# Patient Record
Sex: Male | Born: 1965 | Race: White | Hispanic: No | Smoking: Current every day smoker
Health system: Southern US, Community
[De-identification: ages and names within clinical notes are randomized; demographics above are authoritative.]

## PROBLEM LIST (undated history)

## (undated) DIAGNOSIS — M79606 Pain in leg, unspecified: Secondary | ICD-10-CM

## (undated) DIAGNOSIS — L818 Other specified disorders of pigmentation: Secondary | ICD-10-CM

## (undated) DIAGNOSIS — F1010 Alcohol abuse, uncomplicated: Secondary | ICD-10-CM

## (undated) DIAGNOSIS — D696 Thrombocytopenia, unspecified: Secondary | ICD-10-CM

## (undated) DIAGNOSIS — M6282 Rhabdomyolysis: Secondary | ICD-10-CM

## (undated) DIAGNOSIS — E871 Hypo-osmolality and hyponatremia: Secondary | ICD-10-CM

## (undated) DIAGNOSIS — F32A Depression, unspecified: Secondary | ICD-10-CM

## (undated) DIAGNOSIS — F1411 Cocaine abuse, in remission: Secondary | ICD-10-CM

## (undated) DIAGNOSIS — K56609 Unspecified intestinal obstruction, unspecified as to partial versus complete obstruction: Secondary | ICD-10-CM

## (undated) DIAGNOSIS — F329 Major depressive disorder, single episode, unspecified: Secondary | ICD-10-CM

## (undated) DIAGNOSIS — G8929 Other chronic pain: Secondary | ICD-10-CM

## (undated) DIAGNOSIS — Z720 Tobacco use: Secondary | ICD-10-CM

## (undated) DIAGNOSIS — B1920 Unspecified viral hepatitis C without hepatic coma: Secondary | ICD-10-CM

## (undated) DIAGNOSIS — N179 Acute kidney failure, unspecified: Secondary | ICD-10-CM

## (undated) DIAGNOSIS — M3320 Polymyositis, organ involvement unspecified: Secondary | ICD-10-CM

## (undated) DIAGNOSIS — G894 Chronic pain syndrome: Secondary | ICD-10-CM

## (undated) DIAGNOSIS — M609 Myositis, unspecified: Secondary | ICD-10-CM

## (undated) DIAGNOSIS — F1910 Other psychoactive substance abuse, uncomplicated: Secondary | ICD-10-CM

## (undated) DIAGNOSIS — K7460 Unspecified cirrhosis of liver: Secondary | ICD-10-CM

## (undated) DIAGNOSIS — R7989 Other specified abnormal findings of blood chemistry: Secondary | ICD-10-CM

## (undated) DIAGNOSIS — M549 Dorsalgia, unspecified: Secondary | ICD-10-CM

## (undated) HISTORY — PX: APPENDECTOMY: SHX54

## (undated) HISTORY — PX: MUSCLE BIOPSY: SHX716

## (undated) HISTORY — PX: HERNIA REPAIR: SHX51

---

## 1996-09-18 DIAGNOSIS — B1710 Acute hepatitis C without hepatic coma: Secondary | ICD-10-CM

## 1999-02-16 ENCOUNTER — Encounter: Payer: Self-pay | Admitting: *Deleted

## 1999-02-16 ENCOUNTER — Emergency Department (HOSPITAL_COMMUNITY): Admission: EM | Admit: 1999-02-16 | Discharge: 1999-02-16 | Payer: Self-pay | Admitting: Emergency Medicine

## 1999-04-12 ENCOUNTER — Emergency Department (HOSPITAL_COMMUNITY): Admission: EM | Admit: 1999-04-12 | Discharge: 1999-04-12 | Payer: Self-pay | Admitting: Emergency Medicine

## 2000-07-21 ENCOUNTER — Emergency Department (HOSPITAL_COMMUNITY): Admission: EM | Admit: 2000-07-21 | Discharge: 2000-07-21 | Payer: Self-pay | Admitting: Emergency Medicine

## 2000-07-21 ENCOUNTER — Encounter: Payer: Self-pay | Admitting: Emergency Medicine

## 2000-08-20 ENCOUNTER — Emergency Department (HOSPITAL_COMMUNITY): Admission: EM | Admit: 2000-08-20 | Discharge: 2000-08-20 | Payer: Self-pay | Admitting: Emergency Medicine

## 2000-08-25 ENCOUNTER — Ambulatory Visit (HOSPITAL_COMMUNITY): Admission: RE | Admit: 2000-08-25 | Discharge: 2000-08-25 | Payer: Self-pay | Admitting: Orthopedic Surgery

## 2000-08-25 ENCOUNTER — Encounter: Payer: Self-pay | Admitting: Orthopedic Surgery

## 2000-09-12 ENCOUNTER — Encounter: Admission: RE | Admit: 2000-09-12 | Discharge: 2000-10-19 | Payer: Self-pay | Admitting: Orthopedic Surgery

## 2001-06-20 ENCOUNTER — Emergency Department (HOSPITAL_COMMUNITY): Admission: EM | Admit: 2001-06-20 | Discharge: 2001-06-20 | Payer: Self-pay | Admitting: Emergency Medicine

## 2001-06-20 ENCOUNTER — Encounter: Payer: Self-pay | Admitting: *Deleted

## 2001-07-03 ENCOUNTER — Inpatient Hospital Stay (HOSPITAL_COMMUNITY): Admission: EM | Admit: 2001-07-03 | Discharge: 2001-07-07 | Payer: Self-pay | Admitting: *Deleted

## 2001-12-23 ENCOUNTER — Inpatient Hospital Stay (HOSPITAL_COMMUNITY): Admission: EM | Admit: 2001-12-23 | Discharge: 2001-12-26 | Payer: Self-pay | Admitting: Psychiatry

## 2002-02-12 ENCOUNTER — Inpatient Hospital Stay (HOSPITAL_COMMUNITY): Admission: EM | Admit: 2002-02-12 | Discharge: 2002-02-14 | Payer: Self-pay | Admitting: Psychiatry

## 2002-02-16 ENCOUNTER — Emergency Department (HOSPITAL_COMMUNITY): Admission: EM | Admit: 2002-02-16 | Discharge: 2002-02-17 | Payer: Self-pay | Admitting: *Deleted

## 2002-02-17 ENCOUNTER — Encounter: Payer: Self-pay | Admitting: *Deleted

## 2002-02-22 ENCOUNTER — Encounter: Payer: Self-pay | Admitting: Emergency Medicine

## 2002-02-22 ENCOUNTER — Emergency Department (HOSPITAL_COMMUNITY): Admission: EM | Admit: 2002-02-22 | Discharge: 2002-02-22 | Payer: Self-pay | Admitting: Emergency Medicine

## 2002-02-24 ENCOUNTER — Emergency Department (HOSPITAL_COMMUNITY): Admission: EM | Admit: 2002-02-24 | Discharge: 2002-02-24 | Payer: Self-pay | Admitting: Emergency Medicine

## 2002-04-14 ENCOUNTER — Emergency Department (HOSPITAL_COMMUNITY): Admission: EM | Admit: 2002-04-14 | Discharge: 2002-04-14 | Payer: Self-pay

## 2004-03-04 ENCOUNTER — Emergency Department (HOSPITAL_COMMUNITY): Admission: EM | Admit: 2004-03-04 | Discharge: 2004-03-04 | Payer: Self-pay | Admitting: Family Medicine

## 2004-04-13 ENCOUNTER — Emergency Department (HOSPITAL_COMMUNITY): Admission: EM | Admit: 2004-04-13 | Discharge: 2004-04-13 | Payer: Self-pay | Admitting: Family Medicine

## 2004-07-02 ENCOUNTER — Emergency Department (HOSPITAL_COMMUNITY): Admission: EM | Admit: 2004-07-02 | Discharge: 2004-07-02 | Payer: Self-pay | Admitting: Internal Medicine

## 2005-02-20 ENCOUNTER — Emergency Department (HOSPITAL_COMMUNITY): Admission: EM | Admit: 2005-02-20 | Discharge: 2005-02-20 | Payer: Self-pay | Admitting: Emergency Medicine

## 2005-02-24 ENCOUNTER — Ambulatory Visit: Payer: Self-pay | Admitting: Cardiovascular Disease

## 2005-02-24 ENCOUNTER — Observation Stay (HOSPITAL_COMMUNITY): Admission: EM | Admit: 2005-02-24 | Discharge: 2005-02-24 | Payer: Self-pay | Admitting: Emergency Medicine

## 2005-03-07 ENCOUNTER — Ambulatory Visit: Payer: Self-pay

## 2005-03-07 ENCOUNTER — Ambulatory Visit: Payer: Self-pay | Admitting: Cardiology

## 2005-06-30 ENCOUNTER — Emergency Department (HOSPITAL_COMMUNITY): Admission: EM | Admit: 2005-06-30 | Discharge: 2005-06-30 | Payer: Self-pay | Admitting: Emergency Medicine

## 2006-09-13 ENCOUNTER — Emergency Department (HOSPITAL_COMMUNITY): Admission: EM | Admit: 2006-09-13 | Discharge: 2006-09-13 | Payer: Self-pay | Admitting: Emergency Medicine

## 2006-10-12 ENCOUNTER — Ambulatory Visit: Payer: Self-pay | Admitting: Family Medicine

## 2007-03-28 ENCOUNTER — Emergency Department (HOSPITAL_COMMUNITY): Admission: EM | Admit: 2007-03-28 | Discharge: 2007-03-28 | Payer: Self-pay | Admitting: Emergency Medicine

## 2007-03-29 ENCOUNTER — Other Ambulatory Visit: Payer: Self-pay | Admitting: Emergency Medicine

## 2007-03-29 ENCOUNTER — Inpatient Hospital Stay (HOSPITAL_COMMUNITY): Admission: AD | Admit: 2007-03-29 | Discharge: 2007-04-01 | Payer: Self-pay | Admitting: Psychiatry

## 2007-03-29 ENCOUNTER — Ambulatory Visit: Payer: Self-pay | Admitting: Psychiatry

## 2007-03-31 ENCOUNTER — Encounter (HOSPITAL_COMMUNITY): Payer: Self-pay | Admitting: Psychiatry

## 2007-04-07 ENCOUNTER — Emergency Department (HOSPITAL_COMMUNITY): Admission: EM | Admit: 2007-04-07 | Discharge: 2007-04-07 | Payer: Self-pay | Admitting: Family Medicine

## 2007-12-03 ENCOUNTER — Inpatient Hospital Stay (HOSPITAL_COMMUNITY): Admission: AD | Admit: 2007-12-03 | Discharge: 2007-12-06 | Payer: Self-pay | Admitting: *Deleted

## 2007-12-03 ENCOUNTER — Emergency Department (HOSPITAL_COMMUNITY): Admission: EM | Admit: 2007-12-03 | Discharge: 2007-12-03 | Payer: Self-pay | Admitting: Emergency Medicine

## 2007-12-03 ENCOUNTER — Ambulatory Visit: Payer: Self-pay | Admitting: *Deleted

## 2007-12-09 ENCOUNTER — Other Ambulatory Visit (HOSPITAL_COMMUNITY): Admission: RE | Admit: 2007-12-09 | Discharge: 2007-12-12 | Payer: Self-pay | Admitting: Psychiatry

## 2007-12-13 ENCOUNTER — Emergency Department (HOSPITAL_COMMUNITY): Admission: EM | Admit: 2007-12-13 | Discharge: 2007-12-13 | Payer: Self-pay | Admitting: Emergency Medicine

## 2008-06-20 ENCOUNTER — Emergency Department (HOSPITAL_COMMUNITY): Admission: EM | Admit: 2008-06-20 | Discharge: 2008-06-20 | Payer: Self-pay | Admitting: Emergency Medicine

## 2008-06-25 ENCOUNTER — Emergency Department (HOSPITAL_COMMUNITY): Admission: EM | Admit: 2008-06-25 | Discharge: 2008-06-25 | Payer: Self-pay | Admitting: Emergency Medicine

## 2009-03-23 ENCOUNTER — Emergency Department (HOSPITAL_COMMUNITY): Admission: EM | Admit: 2009-03-23 | Discharge: 2009-03-23 | Payer: Self-pay | Admitting: Emergency Medicine

## 2009-06-11 ENCOUNTER — Emergency Department (HOSPITAL_COMMUNITY): Admission: EM | Admit: 2009-06-11 | Discharge: 2009-06-11 | Payer: Self-pay | Admitting: Emergency Medicine

## 2009-09-18 DIAGNOSIS — M3320 Polymyositis, organ involvement unspecified: Secondary | ICD-10-CM

## 2009-09-18 HISTORY — DX: Polymyositis, organ involvement unspecified: M33.20

## 2009-10-07 ENCOUNTER — Ambulatory Visit: Payer: Self-pay | Admitting: Vascular Surgery

## 2009-10-08 ENCOUNTER — Emergency Department (HOSPITAL_COMMUNITY): Admission: EM | Admit: 2009-10-08 | Discharge: 2009-10-08 | Payer: Self-pay | Admitting: Emergency Medicine

## 2009-10-13 ENCOUNTER — Inpatient Hospital Stay (HOSPITAL_COMMUNITY): Admission: EM | Admit: 2009-10-13 | Discharge: 2009-10-15 | Payer: Self-pay | Admitting: Emergency Medicine

## 2009-10-13 LAB — CONVERTED CEMR LAB
ALT: 35 units/L
AST: 131 units/L
HCV Ab: REACTIVE

## 2009-10-15 ENCOUNTER — Encounter (INDEPENDENT_AMBULATORY_CARE_PROVIDER_SITE_OTHER): Payer: Self-pay | Admitting: Family Medicine

## 2009-10-26 ENCOUNTER — Ambulatory Visit: Payer: Self-pay | Admitting: Internal Medicine

## 2009-10-26 ENCOUNTER — Encounter (INDEPENDENT_AMBULATORY_CARE_PROVIDER_SITE_OTHER): Payer: Self-pay | Admitting: Nurse Practitioner

## 2009-11-02 ENCOUNTER — Telehealth (INDEPENDENT_AMBULATORY_CARE_PROVIDER_SITE_OTHER): Payer: Self-pay | Admitting: Nurse Practitioner

## 2009-11-10 ENCOUNTER — Telehealth (INDEPENDENT_AMBULATORY_CARE_PROVIDER_SITE_OTHER): Payer: Self-pay | Admitting: Nurse Practitioner

## 2009-11-22 ENCOUNTER — Telehealth (INDEPENDENT_AMBULATORY_CARE_PROVIDER_SITE_OTHER): Payer: Self-pay | Admitting: Nurse Practitioner

## 2009-11-25 ENCOUNTER — Ambulatory Visit: Payer: Self-pay | Admitting: Nurse Practitioner

## 2009-11-25 DIAGNOSIS — Z720 Tobacco use: Secondary | ICD-10-CM

## 2009-12-16 ENCOUNTER — Telehealth (INDEPENDENT_AMBULATORY_CARE_PROVIDER_SITE_OTHER): Payer: Self-pay | Admitting: Nurse Practitioner

## 2009-12-20 ENCOUNTER — Telehealth (INDEPENDENT_AMBULATORY_CARE_PROVIDER_SITE_OTHER): Payer: Self-pay | Admitting: Nurse Practitioner

## 2009-12-22 ENCOUNTER — Encounter (INDEPENDENT_AMBULATORY_CARE_PROVIDER_SITE_OTHER): Payer: Self-pay | Admitting: Nurse Practitioner

## 2009-12-22 LAB — CONVERTED CEMR LAB
ALT: 106 units/L
Creatinine, Ser: 0.7 mg/dL
Glucose, Bld: 90 mg/dL

## 2009-12-24 ENCOUNTER — Encounter (INDEPENDENT_AMBULATORY_CARE_PROVIDER_SITE_OTHER): Payer: Self-pay | Admitting: Nurse Practitioner

## 2010-01-14 ENCOUNTER — Telehealth (INDEPENDENT_AMBULATORY_CARE_PROVIDER_SITE_OTHER): Payer: Self-pay | Admitting: Nurse Practitioner

## 2010-01-18 ENCOUNTER — Telehealth (INDEPENDENT_AMBULATORY_CARE_PROVIDER_SITE_OTHER): Payer: Self-pay | Admitting: Nurse Practitioner

## 2010-02-03 ENCOUNTER — Ambulatory Visit: Payer: Self-pay | Admitting: Nurse Practitioner

## 2010-02-03 LAB — CONVERTED CEMR LAB
Cholesterol, target level: 200 mg/dL
HDL goal, serum: 40 mg/dL
LDL Goal: 160 mg/dL

## 2010-02-05 ENCOUNTER — Emergency Department (HOSPITAL_COMMUNITY): Admission: EM | Admit: 2010-02-05 | Discharge: 2010-02-05 | Payer: Self-pay | Admitting: Emergency Medicine

## 2010-02-05 ENCOUNTER — Ambulatory Visit: Payer: Self-pay | Admitting: Vascular Surgery

## 2010-02-05 ENCOUNTER — Encounter (INDEPENDENT_AMBULATORY_CARE_PROVIDER_SITE_OTHER): Payer: Self-pay | Admitting: Gastroenterology

## 2010-02-07 ENCOUNTER — Encounter (INDEPENDENT_AMBULATORY_CARE_PROVIDER_SITE_OTHER): Payer: Self-pay | Admitting: Nurse Practitioner

## 2010-02-08 ENCOUNTER — Telehealth (INDEPENDENT_AMBULATORY_CARE_PROVIDER_SITE_OTHER): Payer: Self-pay | Admitting: Nurse Practitioner

## 2010-02-09 ENCOUNTER — Emergency Department (HOSPITAL_COMMUNITY): Admission: EM | Admit: 2010-02-09 | Discharge: 2010-02-09 | Payer: Self-pay | Admitting: Emergency Medicine

## 2010-02-14 ENCOUNTER — Encounter (INDEPENDENT_AMBULATORY_CARE_PROVIDER_SITE_OTHER): Payer: Self-pay | Admitting: Nurse Practitioner

## 2010-02-15 ENCOUNTER — Telehealth (INDEPENDENT_AMBULATORY_CARE_PROVIDER_SITE_OTHER): Payer: Self-pay | Admitting: Nurse Practitioner

## 2010-02-17 ENCOUNTER — Encounter (INDEPENDENT_AMBULATORY_CARE_PROVIDER_SITE_OTHER): Payer: Self-pay | Admitting: Nurse Practitioner

## 2010-02-21 ENCOUNTER — Encounter (INDEPENDENT_AMBULATORY_CARE_PROVIDER_SITE_OTHER): Payer: Self-pay | Admitting: Nurse Practitioner

## 2010-02-22 ENCOUNTER — Telehealth (INDEPENDENT_AMBULATORY_CARE_PROVIDER_SITE_OTHER): Payer: Self-pay | Admitting: Nurse Practitioner

## 2010-02-23 ENCOUNTER — Encounter (INDEPENDENT_AMBULATORY_CARE_PROVIDER_SITE_OTHER): Payer: Self-pay | Admitting: Nurse Practitioner

## 2010-02-24 ENCOUNTER — Encounter (INDEPENDENT_AMBULATORY_CARE_PROVIDER_SITE_OTHER): Payer: Self-pay | Admitting: Nurse Practitioner

## 2010-03-14 ENCOUNTER — Encounter (INDEPENDENT_AMBULATORY_CARE_PROVIDER_SITE_OTHER): Payer: Self-pay | Admitting: Nurse Practitioner

## 2010-03-30 ENCOUNTER — Encounter (INDEPENDENT_AMBULATORY_CARE_PROVIDER_SITE_OTHER): Payer: Self-pay | Admitting: Nurse Practitioner

## 2010-05-04 ENCOUNTER — Encounter (INDEPENDENT_AMBULATORY_CARE_PROVIDER_SITE_OTHER): Payer: Self-pay | Admitting: Nurse Practitioner

## 2010-06-17 ENCOUNTER — Ambulatory Visit: Payer: Self-pay | Admitting: Nurse Practitioner

## 2010-06-17 DIAGNOSIS — IMO0001 Reserved for inherently not codable concepts without codable children: Secondary | ICD-10-CM

## 2010-06-17 DIAGNOSIS — M79609 Pain in unspecified limb: Secondary | ICD-10-CM

## 2010-06-17 DIAGNOSIS — M549 Dorsalgia, unspecified: Secondary | ICD-10-CM | POA: Insufficient documentation

## 2010-06-29 ENCOUNTER — Telehealth (INDEPENDENT_AMBULATORY_CARE_PROVIDER_SITE_OTHER): Payer: Self-pay | Admitting: Nurse Practitioner

## 2010-07-04 ENCOUNTER — Telehealth (INDEPENDENT_AMBULATORY_CARE_PROVIDER_SITE_OTHER): Payer: Self-pay | Admitting: Nurse Practitioner

## 2010-07-11 ENCOUNTER — Encounter (INDEPENDENT_AMBULATORY_CARE_PROVIDER_SITE_OTHER): Payer: Self-pay | Admitting: Nurse Practitioner

## 2010-07-13 ENCOUNTER — Telehealth (INDEPENDENT_AMBULATORY_CARE_PROVIDER_SITE_OTHER): Payer: Self-pay | Admitting: Nurse Practitioner

## 2010-07-18 ENCOUNTER — Telehealth (INDEPENDENT_AMBULATORY_CARE_PROVIDER_SITE_OTHER): Payer: Self-pay | Admitting: Nurse Practitioner

## 2010-08-24 ENCOUNTER — Telehealth (INDEPENDENT_AMBULATORY_CARE_PROVIDER_SITE_OTHER): Payer: Self-pay | Admitting: Nurse Practitioner

## 2010-08-24 ENCOUNTER — Ambulatory Visit: Payer: Self-pay | Admitting: Pain Medicine

## 2010-08-26 ENCOUNTER — Encounter (INDEPENDENT_AMBULATORY_CARE_PROVIDER_SITE_OTHER): Payer: Self-pay | Admitting: Internal Medicine

## 2010-09-28 ENCOUNTER — Telehealth (INDEPENDENT_AMBULATORY_CARE_PROVIDER_SITE_OTHER): Payer: Self-pay | Admitting: Nurse Practitioner

## 2010-10-03 ENCOUNTER — Telehealth (INDEPENDENT_AMBULATORY_CARE_PROVIDER_SITE_OTHER): Payer: Self-pay | Admitting: Nurse Practitioner

## 2010-10-06 ENCOUNTER — Encounter (INDEPENDENT_AMBULATORY_CARE_PROVIDER_SITE_OTHER): Payer: Self-pay | Admitting: Nurse Practitioner

## 2010-10-18 NOTE — Letter (Signed)
Summary: Discharge Summary  Discharge Summary   Imported By: Arta Bruce 02/28/2010 16:49:24  _____________________________________________________________________  External Attachment:    Type:   Image     Comment:   External Document

## 2010-10-18 NOTE — Progress Notes (Signed)
Summary: MEDS REFILL  Phone Note Refill Request Call back at [phone]   Refills Requested: Medication #1:  OXYCODONE-ACETAMINOPHEN 10-650 MG TABS One tablet by mouth three times a day as needed for pain. NEED REFILL , [facility] [address] [address] [facility] [address]  Initial call taken by: Domenic Polite,  July 13, 2010 2:34 PM  Follow-up for Phone Call        I have another note open for pt - HIs meds will be filled on MONDAY! Follow-up by: Lehman Prom FNP,  July 13, 2010 3:03 PM  Additional Follow-up for Phone Call Additional follow up Details #1::        Pt. notified.  Dutch Quint RN  July 13, 2010 3:09 PM

## 2010-10-18 NOTE — Progress Notes (Signed)
Summary: Pain refill  Phone Note Call from Patient Call back at [facility] Phone [phone] Call back at [phone]   Summary of Call: PT IS REQUESTING FOR MORE REFILLS FROM IBUPROFEN AND PECOCET MEDICATIONS.  THE PT SUPPOSTLY NEEDS TO PICK UP BY WEDNESDAY BUT HE STATES THAT HE WON'T HAVE TRANSPORTATION NEXT WEEK. MARTIN FNP Initial call taken by: Manon Hilding,  January 14, 2010 8:44 AM  Follow-up for Phone Call        forward to N. Daphine Deutscher, FNP both meds last filled 12/20/2009 Follow-up by: Levon Hedger,  Jan 17, 2010 8:49 AM  Additional Follow-up for Phone Call Additional follow up Details #1::        PT CALLED BACK AGAIN BECAUSE HE NEEDS TO GET HIS PAIN MEDICATION BY TODAY BECAUSE HE DO NOT HAVE TRANSPORTATION TO COME ON WEDNESDAY (MOTHER WILL BE LIVING OUT OF THE TOWN BY TODAY).Manon Hilding  Jan 17, 2010 11:03 AM he also states that he is out of medication and he is in alot of pain and if he can not get medication today he says that he will have to go to the ER.  I told pt that you have the note and will respond as soon as you get a chance.  pt is informed that you are seeing pt and I told him as soon as you had a chance to review chart and respond I would give him a call to let him know what would be done. Levon Hedger  Jan 17, 2010 11:13 AM     Additional Follow-up for Phone Call Additional follow up Details #2::    As unfortunate as it is...his medications are due when they are due. Will not fill before 01/19/2010 (Wednesday)   when he gets a ride or mother returns to town he can pick up Follow-up by: Lehman Prom FNP,  Jan 17, 2010 1:35 PM  Additional Follow-up for Phone Call Additional follow up Details #3:: Details for Additional Follow-up Action Taken: Pt called back and informed of when he could get his meds, he stated he guesses he will go to the ED then and that "I guess y'all won't be my doctors no more"............ Vesta Mixer CMA  Jan 17, 2010 2:07 PM    noted. if pt gets meds from ER will  NOT refill on 01/19/2010 -  pain contract will be inforced n.martin,fnp  Jan 17, 2010  Jan 17, 2010     Appended Document: Pain refill Pt called back to apologize and said he can wait for Wed to get his meds.

## 2010-10-18 NOTE — Assessment & Plan Note (Signed)
Summary: Muscle pain   Vital Signs:  Patient profile:   45 year old male Weight:      141.9 pounds BMI:     22.31 BSA:     1.75 Temp:     98.4 degrees F oral Pulse rate:   103 / minute Pulse rhythm:   regular Resp:     20 per minute BP sitting:   110 / 75  (left arm) Cuff size:   regular  Vitals Entered ByLevon Hedger (Feb 03, 2010 9:35 AM) CC: follow-up visit 2 month...pain in right calf muscle..percocet is making him sick on the stomach, Lipid Management Is Patient Diabetic? No Pain Assessment Patient in pain? yes     Location: leg  Does patient need assistance? Functional Status Self care Ambulation Normal   CC:  follow-up visit 2 month...pain in right calf muscle..percocet is making him sick on the stomach and Lipid Management.  History of Present Illness:  Pt into the office with follow up on leg pain. Pt was in [facility] back February and dx with myositis. Presented at that time with quick onset of pain in right thigh. He had a muscle biopsy done.  Started on Prednisone taper.   He was seen in this office in follow up with slight improvement in symptoms. He was given percocet for pain, which only helped minimally. Pt has been to [facility] Rheumatology and he did get inital testing to conform the original dx. Pt was to f/u for the results but was not able to due to transportation He was to get the lab results and nerve conduction test done but again was NOT able to completed. Suddenly in the past 2 weeks pain has progressed down into the right leg. Pt is unable to bear weight on his right leg. Must walk on his tip toes. He has completed his supply of percocet as he is having to take more frequently in attempt to decrease pain. Daytime activities such as walking, showering is difficult.  Pt is not able to sleep at night due to pain  Presents today with wife and 2 children who comfirm pt's decline in function especially over the past 2 weeks  Lipid  Management History:      Positive NCEP/ATP III risk factors include current tobacco user.  Negative NCEP/ATP III risk factors include male age less than 63 years old.    Allergies (verified): 1)  ! Darvocet  Review of Systems General:  Denies fever. CV:  Denies chest pain or discomfort. Resp:  Denies cough. GI:  Complains of nausea; denies abdominal pain and vomiting; liekly due to increase in pain meds. MS:  Complains of muscle; right thigh and calf muscles.  Physical Exam  General:  alert.   Head:  normocephalic.  thin hair   Detailed Neurologic Exam  Gait:    steppage gait.   Posture:    moderately stooped.   Strength:    abnormal right lower extremity.     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  Problem # 2:  TOBACCO ABUSE (ICD-305.1) advised cessation  Complete Medication List: 1)  Oxycodone-acetaminophen 5-325 Mg Tabs (Oxycodone-acetaminophen) .... 2 tablets by mouth two times a day  as needed for pain 2)  Ibuprofen 800 Mg Tabs (Ibuprofen) .... One tablet by mouth two times a day as needed for pain  Lipid Assessment/Plan:      Based on NCEP/ATP III, the patient's risk factor category is "0-1 risk factors".  The patient's lipid goals are as follows: Total cholesterol goal is 200; LDL cholesterol goal is 160; HDL cholesterol goal is 40; Triglyceride goal is 150.     Patient Instructions: 1)  Rheumatology appointment may be several weeks away. 2)  This provider would advise that you go to the Emergency room for evaluation particularly because the pain in your right thigh progressing to right shin. 3)  You need to see a Rheumatologist ASAP and treatment with  interventions other than pain medications

## 2010-10-18 NOTE — Progress Notes (Signed)
Summary: CALLING FOR HIS PAIN MEDS  Phone Note Call from Patient Call back at Home Phone [phone]   Reason for Call: Refill Medication Summary of Call: [name], MR [place] CALLED AHEAD OF TIME FOR HIS IBUPROFEN, VALUM, AND PERCOCET WHICH IS DUE 10/30, BUT SINCE THE 30TH IS ON A SUNDAY, CAN HE PICK IT UP ON THE 28th.  HE IS ALSO CHECKING ON THE REFERRAL TO PAIN MANAGEMENT CLINIC AND THE ORTHOPEDIC. Initial call taken by: Leodis Rains,  July 04, 2010 9:11 AM  Follow-up for Phone Call        forward to N. Daphine Deutscher, fnp Follow-up by: Levon Hedger,  July 08, 2010 10:59 AM  Additional Follow-up for Phone Call Additional follow up Details #1::        will fill at the end of this week notify Good that pain managment referral is pending and once he is transferred to the pain clinic then they will manage her meds Additional Follow-up by: Lehman Prom FNP,  July 11, 2010 5:58 PM    Additional Follow-up for Phone Call Additional follow up Details #2::    Advised Good that he had an appt with ortho on 07/09/2010 and this office was informed that he did not go. pain management referral is still pending be sure Good is clear this office will NOT provide long term pain meds - this will be the last month This office is only responsible for making referrals for Good so he can get necessary treatment and f/u. if he choses not to keep the appt this his consequence will be uncertainty about his dx. But be very clear this office is not pain management and will not continue to prescribe narcotics November will be the last Rx (rx printed and in basket)  Good informed of above information and has picked up pain medication. Follow-up by: Levon Hedger,  July 19, 2010 1:00 PM  Prescriptions: OXYCODONE-ACETAMINOPHEN 10-650 MG TABS (OXYCODONE-ACETAMINOPHEN) One tablet by mouth three times a day as needed for pain  #90 x 0   Entered and Authorized by:   Lehman Prom FNP   Signed by:    Lehman Prom FNP on 07/16/2010   Method used:   Print then Give to Patient   RxID:   [id number] DIAZEPAM 5 MG TABS (DIAZEPAM) One tablet by mouth nightly as needed for muscles and rest  #20 x 0   Entered and Authorized by:   Lehman Prom FNP   Signed by:   Lehman Prom FNP on 07/16/2010   Method used:   Print then Give to Patient   RxID:   [id number] IBUPROFEN 800 MG TABS (IBUPROFEN) One tablet by mouth two times a day as needed for pain  #60 x 0   Entered and Authorized by:   Lehman Prom FNP   Signed by:   Lehman Prom FNP on 07/16/2010   Method used:   Print then Give to Patient   RxID:   [id number] OXYCODONE-ACETAMINOPHEN 10-650 MG TABS (OXYCODONE-ACETAMINOPHEN) One tablet by mouth three times a day as needed for pain  #90 x 0   Entered and Authorized by:   Lehman Prom FNP   Signed by:   Lehman Prom FNP on 07/16/2010   Method used:   Print then Give to Patient   RxID:   [id number] DIAZEPAM 5 MG TABS (DIAZEPAM) One tablet by mouth nightly as needed for muscles and rest  #20 x 0   Entered and Authorized by:   Lehman Prom FNP  Signed by:   Lehman Prom FNP on 07/16/2010   Method used:   Print then Give to Patient   RxID:   [id number] IBUPROFEN 800 MG TABS (IBUPROFEN) One tablet by mouth two times a day as needed for pain  #60 x 0   Entered and Authorized by:   Lehman Prom FNP   Signed by:   Lehman Prom FNP on 07/16/2010   Method used:   Print then Give to Patient   RxID:   [id number]

## 2010-10-18 NOTE — Progress Notes (Signed)
Summary: Pain meds  Phone Note Call from Patient Call back at Home Phone [phone]   Summary of Call: Pt wants to know for sure if his medication can be ready early in the morning because someone will gave him the right to come here. Can someone call him back.Manon Hilding  Jan 18, 2010 2:19 PM    Follow-up for Phone Call        Rx printed for pt to pick up Follow-up by: Lehman Prom FNP,  Jan 19, 2010 8:26 AM  Additional Follow-up for Phone Call Additional follow up Details #1::        pt in office to pick up Rx. Additional Follow-up by: Levon Hedger,  Jan 19, 2010 8:42 AM    Prescriptions: OXYCODONE-ACETAMINOPHEN 5-325 MG TABS (OXYCODONE-ACETAMINOPHEN) 2 tablets by mouth two times a day as needed for pain  #90 x 0   Entered and Authorized by:   Lehman Prom FNP   Signed by:   Lehman Prom FNP on 01/19/2010   Method used:   Print then Give to Patient   RxID:   [id number]

## 2010-10-18 NOTE — Letter (Signed)
[facility]: [facility]   Imported By: Arta Bruce 02/24/2010 15:44:08  _____________________________________________________________________  External Attachment:    Type:   Image     Comment:   External Document

## 2010-10-18 NOTE — Letter (Signed)
Summary: PT INFORMATION SHEET  PT INFORMATION SHEET   Imported By: Arta Bruce 12/15/2009 12:36:38  _____________________________________________________________________  External Attachment:    Type:   Image     Comment:   External Document

## 2010-10-18 NOTE — Letter (Signed)
Summary: FAXED REQUESTED RECORDS TO [facility]  FAXED REQUESTED RECORDS TO [facility]   Imported By: Arta Bruce 07/11/2010 09:15:20  _____________________________________________________________________  External Attachment:    Type:   Image     Comment:   External Document

## 2010-10-18 NOTE — Progress Notes (Signed)
Summary: Rheumatology referral update  Phone Note Call from Patient Call back at Home Phone [phone] Call back at [phone]   Caller: WIFE- LINDA Reason for Call: Referral Summary of Call: MARTIN PT. MR Tompson WIFE CALLED AND SAYS THAT WHEN MR Baril WAS IN THE HOSPITAL IN [place], THE SURGEON, DR WAKEFIELD SAYS THAT MR Schussler NEEDS A FOLLOW UP APPOINTMENT WITH A RHUMATOLOGIST CONCERNING THE INLAMATION IN HIS RIGHT LEG. HE HAD A MUSCLE BIOPSY DONE ON JANUARY 28th.  MR Finkbiner IS CURRENTLY TAKING PREDNOSONE AND HE HAS  3 DAYS OF THE MEDICAITON LEFT. HE WAS TOLD BY DR Hinda Lenis AT [facility] THAT HE WOULD HAVE TO PAY MONEY UP FRON TO SEE A RHUMATOLOGIST BUT HE HAS NO INSURANCE. Initial call taken by: Leodis Rains,  November 02, 2009 11:33 AM  Follow-up for Phone Call        The pt called in because she said that the provider needs the phone number from the Rheumatologoy. [phone].Manon Hilding  November 02, 2009 12:25 PM  spoke with patient who insists that I call his wife due to he has trouble remembering things and she takes care of all his affairs.Marland KitchenMarland KitchenMarland KitchenMikey College CMA  November 03, 2009 10:56 AM   spoke with Linda(wife)[phone] pt was seen in hospital by rhuematologist but obviously when patient was discharged the office would not see him due to not having any insurance. Wife states that the original rhuematologist stated he needed to stay on prednisone and so today is his last day of the medication. they are concerned since at this point they are in limbo. I did advise wife woud forward information to provider regarding medications and also to request a referral to an outside rheumatologist.      Follow-up by: Mikey College CMA,  November 03, 2009 11:12 AM  Additional Follow-up for Phone Call Additional follow up Details #1::        pt will need to be referred to rheumatology at [facility] unfortunately no intown rheumatologist accept HS pts without the co-payment.  It may mean  that pt will have a wait time before he can be seen but will send info to Candi Leash who can try to get pt to [facility] at next available appointment given the severe nature of his problem and recent hospital D/C Additional Follow-up by: Lehman Prom FNP,  November 04, 2009 8:45 AM    Additional Follow-up for Phone Call Additional follow up Details #2::    Spoke to Pt on 11-05-09.Pt aware of appt on 12-22-09 @ 3:30pm @ [facility].Pt is also placed on a cancellation list.  Additional Follow-up for Phone Call Additional follow up Details #3:: Details for Additional Follow-up Action Taken: noted Additional Follow-up by: Lehman Prom FNP,  November 08, 2009 8:03 AM

## 2010-10-18 NOTE — Progress Notes (Signed)
Summary: PAIN MED REFILL  Phone Note Call from Patient Call back at Home Phone [phone]   Summary of Call: PT STATES HE WANTS TO SEE IF HE CAN GO AHEAD AND GET HIS RX FOR PERCOCET TODAY. HE IS DUE TOMORROW BUT STATES HE IS IN ALOT OF PAIN SINCE HE HAD A BIOPSY TAKEN OUT OF HIS LEG. Initial call taken by: Mikey College CMA,  November 22, 2009 11:00 AM  Follow-up for Phone Call        Rx in basket -pt to come pick up notify  him that this is a 30 day supply. No increase in his pain medications. he may try to take ibuprofen in between so that pain does not get quite so intense. He should keep appt with specialist as scheduled Follow-up by: Lehman Prom FNP,  November 22, 2009 11:59 AM  Additional Follow-up for Phone Call Additional follow up Details #1::        pt informed of above information .  Rx is in front office for pt to pick up. Additional Follow-up by: Levon Hedger,  November 22, 2009 12:30 PM    Prescriptions: OXYCODONE-ACETAMINOPHEN 5-325 MG TABS (OXYCODONE-ACETAMINOPHEN) 2 tablets by mouth two times a day as needed for pain  #90 x 0   Entered and Authorized by:   Lehman Prom FNP   Signed by:   Lehman Prom FNP on 11/22/2009   Method used:   Print then Give to Patient   RxID:   [id number]

## 2010-10-18 NOTE — Letter (Signed)
Summary: Generic Letter  [facility]  [address] [place], [place] [address]   Phone: [phone]  Fax: [phone]    02/23/2010  [name] [address] [place], [place]  [address]  Dear [name],  It has come to my attention that you have come to this office several times requesting pain medications.  I have reviewed the records and corresponding tests done during your recent admission to [facility].  There is still some question as to the cause and nature of your pain. It is my understanding that additional labs have been done and you are supposed to follow up there accordingly.  I would advise you to keep all scheduled follow up appointments at [facility] as outlined during your hospital discharge.  Regarding pain medication - THIS OFFICE IS NOT A PAIN CLINIC You were given pain medications when you originally established with this office but the intent was not that you would be maintained on these medications for an extended period of time. That is why this provider advised you seek further explanation as to the cause of pain.  This still has not been determined and unfortunately this provider is NOT willing to continue to provide you with narcotic medication.   You are requesting medication from this office but you were given oxycodone 10mg  - 30 tablets on February 21, 2010.  You should NOT have completed your supply of those medications if taking as ordered.  Given your history of overuse of pain medications would advise you get medications in a controlled setting either at a pain clinic (which will be difficult to attend due to cost) or when you attend your speciality appointments at [facility].  Since appropriate diagnosis depends on your ability to follow up. I will give you ibuprofen 800mg  to alternate with the oxycodone that you already have but again I am NOT willing to prescribe any further narcotics for you.  Sincerely,    Ryan Prom FNP [facility]

## 2010-10-18 NOTE — Letter (Signed)
Summary: OUTPATIENT CHEMISTRY RESULT  OUTPATIENT CHEMISTRY RESULT   Imported By: Arta Bruce 05/12/2010 11:55:40  _____________________________________________________________________  External Attachment:    Type:   Image     Comment:   External Document

## 2010-10-18 NOTE — Letter (Signed)
Summary: OUTPATIENT CHEMISTRY RESULT  OUTPATIENT CHEMISTRY RESULT   Imported By: Arta Bruce 03/16/2010 12:43:00  _____________________________________________________________________  External Attachment:    Type:   Image     Comment:   External Document

## 2010-10-18 NOTE — Miscellaneous (Signed)
Summary: Lab results  Clinical Lists Changes  Observations: Added new observation of SGPT (ALT): 106 units/L (12/22/2009 8:25) Added new observation of SGOT (AST): 91 units/L (12/22/2009 8:25) Added new observation of ALK PHOS: 46 units/L (12/22/2009 8:25) Added new observation of BILI TOTAL: 0.8 mg/dL (37/16/9678 9:38) Added new observation of CREATININE: 0.7 mg/dL (07/04/5101 5:85) Added new observation of BUN: 6 mg/dL (27/78/2423 5:36) Added new observation of BG RANDOM: 90 mg/dL (14/43/1540 0:86) Added new observation of CO2 PLSM/SER: 24 meq/L (12/22/2009 8:25) Added new observation of CL SERUM: 97 meq/L (12/22/2009 8:25) Added new observation of K SERUM: 4.3 meq/L (12/22/2009 8:25) Added new observation of NA: 133 meq/L (12/22/2009 8:25)

## 2010-10-18 NOTE — Letter (Signed)
Summary: Handout Printed  Printed Handout:  - Polymyositis

## 2010-10-18 NOTE — Progress Notes (Signed)
Summary: REQUESTING PAIN MED.  Phone Note Call from Patient Call back at Home Phone [phone]   Reason for Call: Refill Medication Summary of Call: [name] HAD AN APPOINTMENT THIS AFTERNOON, BUT HE CANCELLED, BECAUSE HE DIDN'T HAVE TRANSPORTATION AND MONEY. HE WANTS TO KNOW IF HE CAN GET HIS MEDICATION IN ADVANCE (PERCOCET) BECAUSE HE IS IN A LOT OF PAIN. HE SAYS THAT HE IS TAKING THE IBUPROFEN, BUT ITS NOT DOING ANY GOOD AT ALL.  HE SAYS THAT IF HE CAN'T GET HIS MEDICINE, THEN HE MAY HAVE TO BREAK HIS PAIN CONTRACT AND GO TO THE ER. HE CAN'T SIT LONG, CAN'T SLEEP AND HE IS UP ALL NIGHT. Initial call taken by: Leodis Rains,  December 16, 2009 10:17 AM  Follow-up for Phone Call        forward to N. Daphine Deutscher, FNP Follow-up by: Levon Hedger,  December 16, 2009 1:14 PM  Additional Follow-up for Phone Call Additional follow up Details #1::        Pt was given percocet on 11/22/2009 - month supply Will not refill medications until 12/23/2009 Advise pt to keep his appointment at [facility] on next week as scheduled for follow up on exactly what is causing the problem. Additional Follow-up by: Lehman Prom FNP,  December 16, 2009 2:52 PM    Additional Follow-up for Phone Call Additional follow up Details #2::    The pt is aware of the previous message.Manon Hilding  December 16, 2009 4:05 PM

## 2010-10-18 NOTE — Progress Notes (Signed)
Summary: Pain medications  Phone Note Call from Patient   Summary of Call: Pt requesting refill on his pain med, because he is in a lot of pain.  He states he is not due for 3 days, but he can't stand the pain any longer and may have to go to the ED.  Pt can be reached at [phone]. Initial call taken by: Vesta Mixer CMA,  December 20, 2009 9:56 AM  Follow-up for Phone Call        will fill pain meds - 3 days early. he should be alternating percocet with ibuprofen. (Rx in basket for both) 90 tablets should last him for 1 months Pt needs to keep his appointment at [facility]  Follow-up by: Lehman Prom FNP,  December 20, 2009 10:06 AM  Additional Follow-up for Phone Call Additional follow up Details #1::        [phone] per subscribers request this customer is not taking calls at this time. Levon Hedger  December 20, 2009 12:13 PM     Additional Follow-up for Phone Call Additional follow up Details #2::    pt has medication and has went to his appt at [facility] Follow-up by: Levon Hedger,  December 23, 2009 12:39 PM  Prescriptions: OXYCODONE-ACETAMINOPHEN 5-325 MG TABS (OXYCODONE-ACETAMINOPHEN) 2 tablets by mouth two times a day as needed for pain  #90 x 0   Entered and Authorized by:   Lehman Prom FNP   Signed by:   Lehman Prom FNP on 12/20/2009   Method used:   Print then Give to Patient   RxID:   [id number] IBUPROFEN 800 MG TABS (IBUPROFEN) One tablet by mouth two times a day as needed for pain  #60 x 0   Entered and Authorized by:   Lehman Prom FNP   Signed by:   Lehman Prom FNP on 12/20/2009   Method used:   Print then Give to Patient   RxID:   [id number]

## 2010-10-18 NOTE — Letter (Signed)
Summary: [facility]   Imported By: Arta Bruce 01/24/2010 16:52:29  _____________________________________________________________________  External Attachment:    Type:   Image     Comment:   External Document

## 2010-10-18 NOTE — Letter (Signed)
Summary: RHEUMATOLOGY & CLINICAL IMMUNOLOGY  RHEUMATOLOGY & CLINICAL IMMUNOLOGY   Imported By: Arta Bruce 03/02/2010 12:23:31  _____________________________________________________________________  External Attachment:    Type:   Image     Comment:   External Document

## 2010-10-18 NOTE — Progress Notes (Signed)
Summary: medication refill  Phone Note Call from Patient Call back at Home Phone [phone]   Summary of Call: the pt wants to get more pain medication (oxycodone)  unitl his next appointment (March 10). Pt doesn't mind the clinic leave  the message to his wife. [facility] F[facility] Initial call taken by: Manon Hilding,  November 10, 2009 12:16 PM  Follow-up for Phone Call        The pt called [place] back today because he wants to know if the provider can help him with the previous request.  He needs some pain medication that can last until his next visit (March 10) because he has a terrible pain in his right leg.  Manon Hilding  November 11, 2009 9:13 AM  Levon Hedger  November 11, 2009 5:49 PM  forward to N. Daphine Deutscher, FNP last filled 10/26/2009 # 90  Additional Follow-up for Phone Call Additional follow up Details #1::        90 tablets was a 30 day supply of medication will not fill early he can call and re-request on March 8th Additional Follow-up by: Lehman Prom FNP,  November 11, 2009 5:55 PM    Additional Follow-up for Phone Call Additional follow up Details #2::    Levon Hedger  November 12, 2009 2:45 PM Pt informed

## 2010-10-18 NOTE — Progress Notes (Signed)
Summary: rheumatology  Phone Note Call from Patient Call back at Home Phone [phone] Call back at [phone]   Summary of Call: Pt went the ER on Saturday, May 24 because muscle deterioration upper right side but the only thing that they have done at the was to make sure he doesn't has blood clutt in his legs and then yesterday the pt call to [facility] but they suggested him to call back to the primary provider because he cannot walk to the bathroom. [facility] FNP Initial call taken by: Manon Hilding,  Feb 08, 2010 10:56 AM  Follow-up for Phone Call        I saw the ER report pt told ER staff that he had a rheumatology appt on May 29th which is why he was discharged without intervention  doppler negative for clot Pt told me that he did NOT have a f/u with rheumatology which is why instructed him to go to the ER in the first place feeling that his symptoms had progressed Which is the truth? At this point, there is nothing I can do  for the pt.  He needs an appt with rheumatology, his labs continued to be high in the ER recently but again no intervention based upon his report of having a rheumatology appt he was given percocet #20 tabs in the ER. Again as discussed with pt during visit pain meds are of little benefit and it is not my preference to continue to prescribe without clear motive and cause for problem Follow-up by: Lehman Prom FNP,  Feb 08, 2010 2:14 PM  Additional Follow-up for Phone Call Additional follow up Details #1::        Spoke with pt's wife and she informed me that they live in seperate locations and she is calling for him she says that when they went to the ER she is saying that they were mistaken with what he said she says that he has an appt with disability board on May 27 instead of Rheumatology on May 29th.  Bonita Quin (wife) says that she called [facility] and she tried to get him an appt and they told her that he needed to see his primary  care and be treated with antibiotics for the symptoms that she gave them that is he is having.  She says that he does not have an appt with Rheumatology. She said that they did not go to the ER looking for pain medication, they want to find out what is causing him to be like this.  I told (wife) that I would get this information to his provider. Levon Hedger  Feb 08, 2010 4:05 PM     Additional Follow-up for Phone Call Additional follow up Details #2::    yes, they did tell me about his disability appt  The leg is not infected so antibiotics will not cover. That's why again ---- I'm not sure how to treat this pt.  Steward Drone - can you call [facility] and see what is the earliest appt pt can get Mention that pt missed last appt due to transportation issues but pain in leg continues, labs - CK - 2571 Pt needs f/u appt so that he can be treated appropriately based on biopsy and testing done there back in April 2011. Notify wife/pt of the rheumatology appt if able to obtainn n.martin,fnp Feb 08, 2010  called Rheumatology appt scheduled for 02/24/10 @ 2:00 with Dr. Laural Benes.I called to speak withpt and  I was informed that he is in [facility] he went on 02-14-10. I left message for pt to call office when he was released from hospital.   Do you want me to call and cancel this appt?   Additional Follow-up for Phone Call Additional follow up Details #3:: Details for Additional Follow-up Action Taken: yes, call back and cancel clinic appt.  inform them that pt is in the hospital and it will be rescheduled when he is discharged n.[email or web address],fnp  February 16, 2010 7:12 AM  appt canceled and will call to reschedule as we recieve information from [facility] stay.   Additional Follow-up by: Levon Hedger,  February 16, 2010 10:30 AM

## 2010-10-18 NOTE — Progress Notes (Signed)
Summary: Requesting pick up prescription one day ahead  Phone Note Call from Patient Call back at [phone]   Summary of Call: Pt would like to know if he can pick his percocet medication on Friday a day ahead of his due date. [facility] FNP Initial call taken by: Manon Hilding,  Feb 15, 2010 4:52 PM  Follow-up for Phone Call        NO! Is pt in the hospital as indicated in previous note (Who called for pt's medication?) Also pt should know that he will not continue to get CHRONIC pain medications from this office, meaning for his acute problem with his leg will manage him on pain meds ONLY until he gets clarity on his diagnosis from Rheumatology, so it is very important that he maintains follow-up there.  This office is not a pain clinic. He has received medication so far from feb - May will be willing to give for one additional month then will start to taper down on the number of pills  he receives per month until percocet is discontinued and pt is managed on non-narcotic meds. Follow-up by: Lehman Prom FNP,  February 16, 2010 7:16 AM  Additional Follow-up for Phone Call Additional follow up Details #1::        spoke with pt's wife and she said she was calling to see if he would still get his pain medication.  I informed her of above information and of the appt scheduled at [facility] on 02/24/10 @ 2:00. I asked pt's wife to contact me on Monday to see if we need to schedule appt out a little further or cancel the appt, but to let us know what the hospital's plan is as well for pt by Monday.    Additional Follow-up for Phone Call Additional follow up Details #2::    noted  Follow-up by: Lehman Prom FNP,  February 16, 2010 9:29 AM

## 2010-10-18 NOTE — Progress Notes (Signed)
Summary: Pain Clinic Referral   Phone Note Outgoing Call   Summary of Call: I send the Referral to [facility]  on 07-04-10 and today I calland they told me that Dr Murray Hodgkins after reviewing the phone notes and the rheumatology notes he don't wamt to see the pt . Can you please, give me the name of the [facility] Thank you  Initial call taken by: Cheryll Dessert,  July 18, 2010 3:44 PM  Follow-up for Phone Call        advise pt that local pain clinic has denied him There are other options for referral but are outside of [facility]  If he is willing to travel (he should have medicaid transportation) we can refer him to Pain clinic in [place] - same process (we will fax referral to that office and await to see if they accept her) remind pt that he was referred due to chronic narcotics and his want to explore other options as it relates to pain management Follow-up by: Lehman Prom FNP,  July 18, 2010 3:56 PM  Additional Follow-up for Phone Call Additional follow up Details #1::        I tallk to pt today and he is willing to go to [facility] in [place] . I send the fax today waiting for an appt Additional Follow-up by: Cheryll Dessert,  July 21, 2010 9:50 AM

## 2010-10-18 NOTE — Letter (Signed)
Summary: SURGICAL PATHOLOGY  REPORT  SURGICAL PATHOLOGY  REPORT   Imported By: Arta Bruce 03/10/2010 12:47:03  _____________________________________________________________________  External Attachment:    Type:   Image     Comment:   External Document

## 2010-10-18 NOTE — Progress Notes (Signed)
Summary: Referrals  Phone Note Outgoing Call   Summary of Call: pt needs 3 referrals: 1.  ortho  2.  medical specialty 3.  pain management referral orders printed and put in your inbox Initial call taken by: Lehman Prom FNP,  June 29, 2010 10:21 AM  Follow-up for Phone Call        1. Orthopedic : Send a referrall to [facility]  They will reviewed and send an appt . 2. Medical Specialty  Send the referral today waiting for an appt . 3.Pain Managment I SEND THE REFERRAL TO [facility] Waiting for an appt  Follow-up by: Cheryll Dessert,  July 05, 2010 4:45 PM

## 2010-10-18 NOTE — Letter (Signed)
Summary: [facility]  [facility]   Imported By: Arta Bruce 05/12/2010 11:59:47  _____________________________________________________________________  External Attachment:    Type:   Image     Comment:   External Document

## 2010-10-18 NOTE — Letter (Signed)
[facility]   Imported By: Arta Bruce 03/09/2010 09:55:47  _____________________________________________________________________  External Attachment:    Type:   Image     Comment:   External Document

## 2010-10-18 NOTE — Assessment & Plan Note (Signed)
Summary: Right leg pain   Vital Signs:  Patient profile:   45 year old male Weight:      143.5 pounds BMI:     22.56 Temp:     98.5 degrees F oral Pulse rate:   100 / minute Pulse rhythm:   regular Resp:     20 per minute BP sitting:   120 / 90  (left arm) Cuff size:   regular  Vitals Entered By: Levon Hedger (June 17, 2010 4:06 PM) CC: pt states he is in alot of pain...he is having alot of trouble keeping food down Is Patient Diabetic? No Pain Assessment Patient in pain? yes     Location: back, legs Intensity: 10 Onset of pain  Chronic  Does patient need assistance? Functional Status Self care Ambulation Normal Comments pt is not taking any medication at this time.   CC:  pt states he is in alot of pain...he is having alot of trouble keeping food down.  History of Present Illness:  Pt presents today in a wheelchair which he states due to pain. Pt was started on prednisone which he reports has made his stomach upset. He also decreased the prednisone but the symptoms still persisted. Prednisone was started by Neurology at [facility] to pt contact that office and let them know that he was unable to take. That office will no longer give pt pain medications  Back pain - History of bulging disc as indicated on the MRI earlier this year.  Rheumatology - Dr. Christian Mate at 07-05-2010 at 12:30.  Wife present with pt today.  Habits & Providers  Alcohol-Tobacco-Diet     Alcohol drinks/day: 0     Alcohol Counseling: not indicated; use of alcohol is not excessive or problematic     Tobacco Status: current     Tobacco Counseling: to quit use of tobacco products  Exercise-Depression-Behavior     Does Patient Exercise: no     Have you felt down or hopeless? no     Have you felt little pleasure in things? no     Depression Counseling: not indicated; screening negative for depression     Drug Use: never  Comments: Pt reports that he quit drinking on  yesterday  Allergies (verified): 1)  ! Darvocet  Review of Systems General:  Denies loss of appetite. CV:  Denies chest pain or discomfort. Resp:  Denies cough. GI:  Denies abdominal pain. MS:  Complains of low back pain and muscle.  Physical Exam  General:  alert.   Head:  normocephalic.   Lungs:  normal breath sounds.   Heart:  normal rate and regular rhythm.   Abdomen:  normal bowel sounds.   Msk:  decreased ROM.   right lower extremity - minimal swelling around calf pain with dorsiflexion Neurologic:  alert & oriented X3.     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 One tablet by mouth two times a day as needed for pain    Oxycodone-acetaminophen 10-650 Mg Tabs (Oxycodone-acetaminophen) ..... One tablet by mouth three times a day as needed for pain  Orders: Pain Clinic Referral (Pain)  Problem # 2:  HEPATITIS C (ICD-070.51)  pt will need f/u with specialist for hep c work up  Orders: Hepatitis C Clinic Referral (HepC)  Problem # 3:  TOBACCO ABUSE (ICD-305.1) advised cessation  Problem # 4:  LEG PAIN, RIGHT (ICD-729.5)  ongoing due to myositis however advised pt that this office will not supply chronic pain medications  Orders: Pain Clinic Referral (Pain)  Complete Medication List: 1)  Ibuprofen 800 Mg Tabs (Ibuprofen) .... One tablet by mouth two times a day as needed for pain 2)  Diazepam 5 Mg Tabs (Diazepam) .... One tablet by mouth nightly as needed for muscles and rest 3)  Oxycodone-acetaminophen 10-650 Mg Tabs (Oxycodone-acetaminophen) .... One tablet by mouth three times a day as needed for pain  Other Orders: Orthopedic Referral (Ortho)  Patient Instructions: 1)  Call caseworker for medicaid  for transportation. 2)  Need [employer] paperwork so that they can assist you with transportation  to [facility]. 3)  Back  - you will be referred to the specialist.  you will be informed of the time/date of the appointment. 4)  Hepatitis C - you need follow up.  The local office is medical specialty services. 5)  Rheumatology - You still need to follow up at [facility] for this.  Let them  know that you are not taking the prednisone.  You may be a candidate to get additional treatment. 6)  Pain management - will give pain medication for 30 days and for one more prescription. you will be transferred to the pain clinic.  This office will not manage chronic pain medications. 7)  Try to take the valium as needed at night to see if this will help relax your muscles and rest. Prescriptions: IBUPROFEN 800 MG TABS (IBUPROFEN) One tablet by mouth two times a day as needed for pain  #60 x 0   Entered and Authorized by:   Lehman Prom FNP   Signed by:   Lehman Prom FNP on 06/17/2010   Method used:   Print then Give to Patient   RxID:   [id number] OXYCODONE-ACETAMINOPHEN 10-650 MG TABS (OXYCODONE-ACETAMINOPHEN) One tablet by mouth three times a day as needed for pain  #90 x 0   Entered and Authorized by:   Lehman Prom FNP   Signed by:   Lehman Prom FNP on 06/17/2010   Method used:   Print then Give to Patient   RxID:   [id number] DIAZEPAM 5 MG TABS (DIAZEPAM) One tablet by mouth nightly as needed for muscles and rest  #20 x 0   Entered and Authorized by:   Lehman Prom FNP   Signed by:   Lehman Prom FNP on 06/17/2010   Method used:   Print then Give to Patient   RxID:   [id number]

## 2010-10-18 NOTE — Assessment & Plan Note (Signed)
Summary: NEW - Establish Care   Vital Signs:  Patient profile:   45 year old male Height:      67 inches Weight:      141.5 pounds BMI:     22.24 BSA:     1.75 Temp:     98.1 degrees F oral Pulse rate:   102 / minute Pulse rhythm:   regular Resp:     20 per minute BP sitting:   114 / 81  (left arm) Cuff size:   regular  Vitals Entered By: Levon Hedger (October 26, 2009 10:01 AM) CC: follow-up visit [facility]...pain in right leg...cant bend leg and has been out of pain medication for 2 days Is Patient Diabetic? No Pain Assessment Patient in pain? yes     Location: leg Intensity: 9 Onset of pain  Constant  Does patient need assistance? Functional Status Self care Ambulation Normal, Impaired:Risk for fall   CC:  follow-up visit [facility]...pain in right leg...cant bend leg and has been out of pain medication for 2 days.  History of Present Illness:  Pt into the office to establish care. No previous PCP.  Pt presented to the ER on October 13, 2009 with complaints of right leg pain.   Biopsy done on October 13, 2009 by r. Belia Heman removed on 10/26/2009 by Dr. Dwain Sarna He will follow up on 11/02/2009 for review of biopsy results. He is currently taking prednisone 1 to 1.5 mg/kg daily as per hospital D/C. Percocet has been given for pain and pt reports that it is NOT helping his pain.  He has some limitations on right lower extremities. Rheumatology consult done and pt is recommended to have EMG and nerve conduction studies serum studies and muscle biopsies done. Rheumatoid factor was negative C-reactive protein is elevated at 7.5 Total CK = 4522    Right leg pain - At onset of leg pain he thought that one of his children who are ages 19, 11, and 26 had accidently hit him while playing.  However, the pain continued to intensify and he went to the ER.  Hepatitis C - Dx in 1998 but no previous treatment.  Dx confirmed with a positive hepatitis C antibody while in  office +tattoos x 2 which is how pt thinks he contracted the illness +remote history of IV drug use in his teenage years +married Social ETOH use but not daily per pt report  Social - Previously employed at [employer] and [employer] Unemployed for 2 years.  Habits & Providers  Alcohol-Tobacco-Diet     Alcohol drinks/day: 1     Alcohol Counseling: not indicated; patient does not drink     Alcohol type: beer     Tobacco Status: current     Tobacco Counseling: to quit use of tobacco products     Cigarette Packs/Day: 1.0     Year Started: age 63  Exercise-Depression-Behavior     Does Patient Exercise: no     Have you felt down or hopeless? no     Have you felt little pleasure in things? no     Depression Counseling: not indicated; screening negative for depression     Drug Use: never  Past History:  Past Surgical History: Appendectomy at age 38 (30) left inguinal hernia repair at age 3 (61)  Allergies (verified): 1)  ! Darvocet  Past History:  Past Surgical History: Appendectomy at age 48 (14) left inguinal hernia repair at age 15 (74)  Family History: mother - htn  father - deceased (age 64) esophageal cancer Sister - lupus  Social History: 3 children married ETOH - beers on social  Tobacco abuse - started age 27, 1ppd drug - noneSmoking Status:  current Packs/Day:  1.0 Drug Use:  never Does Patient Exercise:  no  Review of Systems CV:  Denies chest pain or discomfort. Resp:  Denies cough. GI:  Denies abdominal pain, nausea, and vomiting. MS:  Complains of muscle and muscle weakness.  Physical Exam  General:  alert.   Head:  normocephalic.  thinning hair Msk:  right lower extremity Right Quad - tenderness with palpation - linear incision healing, defined muscle tenderness no sensation from knee down unable to flex leg at joint Neurologic:  alert & oriented X3.   limping gait unable to raise lower extremity without assistance Skin:  8cm  linear incision - steri stips  (healing well) mulitple tatoos Psych:  Oriented X3.     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 Dwain Sarna for biopsy results he will need to scheduled an appt with Dr. Lendon Colonel  Problem # 2:  HEPATITIS C (ICD-070.51) will need referral but will get problem #1 treated first  Complete Medication List: 1)  Prednisone 10 Mg Tabs (Prednisone) .... Take 8 tablets by mouth every day for 5 days then decrease to 4 tablets every day for 5 days then take 2 tablets every day for 5 days then take 1 tablet every day for 5 d 2)  Oxycodone-acetaminophen 5-325 Mg Tabs (Oxycodone-acetaminophen) .... 2 tablets by mouth two times a day as needed for pain  Patient Instructions: 1)  Keep your appointment with Dr. Dwain Sarna for biopsy results 2)  Also consult your pink discharge sheets for Dr. Orlin Hilding number.  You will need to make a follow up appointment 3)  You have been given a 30 day supply of pain medications. 4)  Follow up in this office in 4 weeks for medication reveiw and assessment of pain. 5)  Will eventually need referral for hepatitis C. 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  Anterior compartment findings more severe on the right side than the left.  No discernible abscess identified. Appearance most compatible with polymysositis.

## 2010-10-18 NOTE — Letter (Signed)
Summary: AGREEMENT FOR CONTROLLED SUBSTANCE  AGREEMENT FOR CONTROLLED SUBSTANCE   Imported By: Arta Bruce 11/25/2009 11:03:15  _____________________________________________________________________  External Attachment:    Type:   Image     Comment:   External Document

## 2010-10-18 NOTE — Miscellaneous (Signed)
Summary: ER f/u - 01/06/2010  Clinical Lists Changes  Pt went to the ER on 02/05/2010 with continued pain in right lower extremity DVT study negative CK, total = 2571  Pt reported to them he had a Rheumatology appt on 02/13/2010. Pt was given percocet (20 tabs)

## 2010-10-18 NOTE — Progress Notes (Signed)
Summary: REFILL ON [name]  Phone Note Call from Patient Call back at [facility] Phone [phone]   Reason for Call: Refill Medication Summary of Call: Ryan PT. [name] CALLED AND SAYS THAT HE WAS RELEASED FROM [facility] YESTERDAY EVENING, AND WANTS TO LET YOU KNOW THAT HE WILL NEED HIS PERCOCET AND IBUPROFEN REFILLED.  [name] SAYS THAT THE DR AT [facility] TOLD HIM TO ALTERNATE THE MEDICINE THAT WE GIVE HIM AND WHAT THEY GAVE HIM, TO MAKE IT LAST. THEY GAVE HIM PERCOCET 10 W/OUT THE ACETIMENIPHEN IN IT, AND HE HAS A FOLLOW UP W/THEM ON JUNE 16. Initial call taken by: Leodis Rains,  February 22, 2010 8:32 AM  Follow-up for Phone Call        forward to N. Daphine Deutscher, FNP  Follow-up by: Levon Hedger,  February 22, 2010 10:44 AM  Additional Follow-up for Phone Call Additional follow up Details #1::        MS Scollard CALLED TO LET [name] KNOW THAT THE DR FROM [facility] SAW HIM IN THE HOSPITAL AND SAYS THAT IT WAS NO NEED FOR HIM TO BE SEEN W/THEM BECAUSE THEY DON'T THINK IT WAS A RHUMATOLOGY NEED, BUT THEY WILL WAIT FOR HIS RESULTS FROM THE HOSPITAL COME IN.  THE APPT. IS ON THURSDAY THE 9th. Additional Follow-up by: Leodis Rains,  February 22, 2010 3:40 PM    Additional Follow-up for Phone Call Additional follow up Details #2::    Call [facility] - have them fax pt's hosptial records to this office Will need to see pt's dx and how he was treated before proceeding with continuing to prescribe pain medications If no REASON for pain found pt should be made aware that this office will NOT manage chronic pain, ie he will not continue to get pain medications month after month. As explained during previous visits - the root cause of pain should be dx and dealt with if no cause found then pt will need to transition to a pain clinic for which he will need to find a means to cover that financially Follow-up by: Lehman Prom FNP,  February 22, 2010 4:08 PM  Additional Follow-up for Phone  Call Additional follow up Details #3:: Details for Additional Follow-up Action Taken: [facility] called -- will fax records for review.  Dutch Quint RN  February 23, 2010 9:23 AM  I have reviewed the records. Pt was given 30 oxycodone on 02/21/2010 - he should NOT be finished with this supply It also notes that he has a follow up appointments as scheduled  He has appt on 03/03/2010 at 9:30 at the acute care clinic where additional labs will be done on 03/28/2010 he has an appt with GI Will give ibuprofen 800mg  by mouth two times a day as needed for pain however will NOT supply pt with narcotics.  Would advise he f/u with [facility] as ordered and as a solution to his prolem is determined he can be adequately medicaided. This office is not a pain clinic and will not be responsible for long term pain management. Of note, this has been a problem for the pt in the past so perhaps a controlled environment for receiving pain meds is best  Letter also written for pt with these comments - review above and give pt letter  Spoke with pt. on telephone and advised of provider's response -- pt. states that ibuprofen does not work; told me to "keep it" and hung up.  Letter and Rx mailed.  Dutch Quint RN  February 23, 2010 12:32 PM  Additional Follow-up by: Lehman Prom FNP,  February 23, 2010 11:33 AM  New/Updated Medications: IBUPROFEN 800 MG TABS (IBUPROFEN) One tablet by mouth two times a day as needed for pain Prescriptions: IBUPROFEN 800 MG TABS (IBUPROFEN) One tablet by mouth two times a day as needed for pain  #60 x 0   Entered and Authorized by:   Lehman Prom FNP   Signed by:   Lehman Prom FNP on 02/23/2010   Method used:   Print then Give to Patient   RxID:   [id number]

## 2010-10-18 NOTE — Assessment & Plan Note (Signed)
Summary: Myositis   Vital Signs:  Patient profile:   45 year old male Weight:      145.4 pounds BMI:     22.86 BSA:     1.77 Temp:     98.1 degrees F oral Pulse rate:   91 / minute Pulse rhythm:   regular Resp:     20 per minute BP sitting:   107 / 71  (left arm) Cuff size:   regular  Vitals Entered By: Levon Hedger (November 25, 2009 9:44 AM) CC: result from biopsy Is Patient Diabetic? No Pain Assessment Patient in pain? yes     Location: right leg Intensity: 7  Does patient need assistance? Functional Status Self care Ambulation Normal   CC:  result from biopsy.  History of Present Illness:  Pt into the office for follow up - Myositis In summary - pt was seen in the hospital for by Dr. Doran Stabler -[facility] and Dr. Orlin Hilding -Rheumatology formyositis.  The procedure was performed without difficulty. Pt did go back for a follow up appointment with Dr.Wakefield for a wound check but he did not get the biopsy results as they were not available.  Follow up appointment in April 6th at Rheumatology in [facility] was made by this office after pt was denied access to see Dr. Lendon Colonel due to lack of coverage. He has in the meantime finished his prednisone taper and is currently maintained on Pain medications. Pain in right thigh continues and even his left thigh is getting weak. Unable to do any meaningful ambulation. Results from Biopsy results show mild acute neurogenic atrophy (reviewed today from fax from [facility]).  Pt presents today with his wife and children.  Habits & Providers  Alcohol-Tobacco-Diet     Alcohol drinks/day: 1     Alcohol Counseling: not indicated; patient does not drink     Alcohol type: beer     Tobacco Status: current     Tobacco Counseling: to quit use of tobacco products     Cigarette Packs/Day: 1.0     Year Started: age 52  Exercise-Depression-Behavior     Does Patient Exercise: no     Have you felt  down or hopeless? no     Have you felt little pleasure in things? no     Depression Counseling: not indicated; screening negative for depression     Drug Use: never  Allergies (verified): 1)  ! Darvocet  Review of Systems General:  Denies fever. CV:  Denies chest pain or discomfort. Resp:  Denies cough. GI:  Denies abdominal pain. MS:  Complains of muscle weakness; pain and weakness in right leg, some weakness in the left.  Physical Exam  General:  alert.   Head:  normocephalic.   Lungs:  normal breath sounds.   Heart:  normal rate.   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 There is no inflammation. Pt has an appointment with [facility] in April - advised pt to keep this appointment Will alternate pain meds with anti-inflammatories. Pt has finished the prednisone taper as ordered by the hospital  Problem # 2:  HEPATITIS C (ICD-070.51) will defer f/u until after problem #1 resolved  Problem # 3:  TOBACCO ABUSE (ICD-305.1) advised cessation  Complete Medication List: 1)  Oxycodone-acetaminophen  5-325 Mg Tabs (Oxycodone-acetaminophen) .... 2 tablets by mouth two times a day as needed for pain 2)  Ibuprofen 800 Mg Tabs (Ibuprofen) .... One tablet by mouth two times a day as needed for pain  Patient Instructions: 1)  Biopsy results show Mild acute neurogenic Atrophy. 2)  Keep your appointment with [facility] as it is VERY important that you see the specialist for further treatment. 3)  Take Percocet, Ibuprofen, Percocet, ibuprofen (with food) during the day to make pain more manageable. 4)  You have signed a pain contract today. 5)  Call next month for pain medication 6)  Follow up in 2 months - May for myositis. Prescriptions: IBUPROFEN 800 MG TABS  (IBUPROFEN) One tablet by mouth two times a day as needed for pain  #60 x 0   Entered and Authorized by:   Lehman Prom FNP   Signed by:   Lehman Prom FNP on 11/25/2009   Method used:   Print then Give to Patient   RxID:   [id number]

## 2010-10-20 NOTE — Progress Notes (Signed)
Summary: Chokes when swallowing  Phone Note Call from Patient Call back at Home Phone [phone] Call back at OR CALL [phone]   Caller: WIFE-LINDA Reason for Call: Talk to Nurse Summary of Call: [name]. MR Nissley WIFE CALLED AND SAYS THAT HE IS NOW HAVING TROUBLE SWALLOWING AND HIS LEG IS STILL GIVING HIM PAIN. WHEN HE DOES SWALLO IT HURTS AND HE CHOKES. THIS HAS BEEN GOING ON FOR 2 WEEK NOW. Initial call taken by: Leodis Rains,  September 28, 2010 11:21 AM  Follow-up for Phone Call        Swallowing difficulty started two weeks ago, has progressively gotten worse. Has had a cold for the past 3-4 days, occurred since problem with swallowing started.  Has trouble swallowing when he is not eating or drinking, has to clear his throat a lot.  Coughs a lot at night as well.  No edema noted, hurts to touch his neck externally front and back.  Denies fever.  Chokes more when eating solid foods than liquids.   Hurts whenever he tries to swallow - feels like his throat is tightening up, squeezing.   Denies difficulty breathing.  Also having pain with leg -- having to walk on tiptoe due to pain flaring now.  On normal basis, he can walk normally, but currently unable to walk flat.  Advised that if he starts to choke without stopping or develops difficulty breathing, to call 911.  Verbalized understanding and agreement. Follow-up by: Dutch Quint RN,  September 28, 2010 11:46 AM  Additional Follow-up for Phone Call Additional follow up Details #1::        Good.'s wife called again, to check provider's response.  Advised that provider has note waiting and that we'll call as soon as possible.  Reiterated to call 911 if emergent symptoms develop.  Dutch Quint RN  September 28, 2010 3:41 PM     Additional Follow-up for Phone Call Additional follow up Details #2::    Regarding leg pain - he should follow up with [facility].  Make it very clear that this provider is NOT prescribing narcotics for this  issue.  that is why he was encouraged to stay with specialist to determine most beneficial treatment plan and medication regimen  Regarding swallowing problems - that can come from any number of reasons.  One possible cause is acid reflux.  Advise Good to limit ETOH, tobacco, spicy foods, onions, garlic, caffiene and tomato products such as spagetti, pizza, lasagna.  All these worsen the problem. Explain to Good that the esophagus is a muscle so over production of acid over time causes erosion of this muscle.  Advise Good to eat SOFT foods such as mashed potatoes, baked foods, and foods that don't require a lot of chewing effort.  Will prescribe nexium 40mg  by mouth daily BEFORE breakfast (Rx in basket).  He should stay up at least 45 mintues at night before laying down.  Try the above for 2 weeks and assess symptoms. If still present inform this office.  Good MUST adhere strict to the dietary restrictions to see benefit.  [email or web address],fnp  September 29, 2010  8:08 AM    Additional Follow-up for Phone Call Additional follow up Details #3:: Details for Additional Follow-up Action Taken: Good.'s wife states that it's too far to go to [facility], they have a child in school -- appointments only available in the afternoon.  States they also missed an appointment due to lack of money for gas and  rheumatologist got "ticked off" and won't see him, even after they explained the circumstances.  He is in extreme pain, wants to know what she should do next.   Given provider's response, with specific instructions and recommendations -- emphasized that they need to be followed strictly for two weeks and to reassess, in order to be followed appropriately by provider.  Advised as to new Rx.  Verbalized understanding and agreement.  Faxed to [facility] per request.  Dutch Quint RN  September 29, 2010 10:09 AM  noted n.[email or web address],fnp September 29, 2010  5:02 PM    New/Updated Medications: NEXIUM 40 MG CPDR (ESOMEPRAZOLE  MAGNESIUM) ONe tablet by mouth daily BEFORE breakfast Prescriptions: NEXIUM 40 MG CPDR (ESOMEPRAZOLE MAGNESIUM) ONe tablet by mouth daily BEFORE breakfast  #30 x 5   Entered and Authorized by:   Lehman Prom FNP   Signed by:   Lehman Prom FNP on 09/29/2010   Method used:   Printed then faxed to ...         RxID:   [id number]

## 2010-10-20 NOTE — Progress Notes (Signed)
Summary: WANT REF FOR HIS PAIN  Phone Note Call from Patient Call back at Home Phone [phone]   Reason for Call: Referral Summary of Call: MARTIN PT. MS Santy CALLED FOR MR Mom AND SAYS THAT SHE CALLED DR JAVED MASOUD OFFICE ON [address] IN [place] , THEY ARE INTERNAL  MEDICINE AND SAYS THAT THEY WOULD SEE HIM AND TREAT HIM FOR HIS PAIN, WE JUST NEED TO SEND A REFERRAL. THERE # IS [id number]. Initial call taken by: Leodis Rains,  October 03, 2010 4:15 PM  Follow-up for Phone Call        Sent to N. Daphine Deutscher.  Dutch Quint RN  October 03, 2010 5:22 PM   Additional Follow-up for Phone Call Additional follow up Details #1::        Pt needs to get his medicaid card changed to indicate that as his new primary care office.  His medicaid case worker can assist with that Additional Follow-up by: Lehman Prom FNP,  October 03, 2010 5:46 PM    Additional Follow-up for Phone Call Additional follow up Details #2::    "Person is unavailable right now - call later."  Dutch Quint RN  October 04, 2010 12:48 PM  "Person is unavailable right now - call later."  Dutch Quint RN  October 05, 2010 2:35 PM  "Person is unavailable right now - call later."  Letter sent.  Dutch Quint RN  October 06, 2010 6:08 PM

## 2010-10-20 NOTE — Letter (Signed)
Summary: Generic Letter  [facility]  [address] [place], [place] [address]   Phone: [phone]  Fax: [phone]        10/06/2010  [name] [address] [place], [place]  [address]  Dear [name],  We have been unable to contact you by telephone per your recent call to [place].  Please call our office, at your earliest convenience, so that we may speak with you.  Sincerely,   Dutch Quint RN

## 2010-10-20 NOTE — Letter (Signed)
Summary: RECORDS FROM [facility]  RECORDS FROM [facility]   Imported By: Arta Bruce 10/14/2010 12:19:34  _____________________________________________________________________  External Attachment:    Type:   Image     Comment:   External Document

## 2010-10-20 NOTE — Progress Notes (Signed)
Summary: PAIN MEDICINE   Phone Note Call from Patient   Caller: Other Relative Summary of Call: PT NIECE  LINDA CALLING BECAUSE MR Runyon WHEN TO THE [facility] FOR A PAIN CLINIC BUT THEY ARE NOT GOING TO PRESCRIBE ANY MEDICINE AN PT HAVE A SEVERE PAIN IN HIS LEGS HE CAN'T WALK AND THE PT WANTS FNP MARTIN TO PRESCRIBE A PAIN MEDICINE . PLEASE, CALL [phone] A.S.A.P. FYI WHEN I CALL TO CONFIRM HIS APPT WITH THE PAIN CLINIC THEY TOLD ME THAT THE PT SHOW UP BUT HE LEFT W/O CHECKING OUT . Initial call taken by: Cheryll Dessert,  August 24, 2010 3:02 PM  Follow-up for Phone Call        forward to N. Martin,fnp Follow-up by: Levon Hedger,  August 24, 2010 4:04 PM  Additional Follow-up for Phone Call Additional follow up Details #1::        This provider will NOT prescribe pain medications for the pt  That was the reason why he was referred to the pain clinic.  Unfortunately, it appears that he left without checking out so perhaps he was going to get  another appt and plan for care. Sorry that pt is having pain in his legs and that this has been an ongoing issue for him but this provider told pt during his last visit that chronic pain medications . He is welcome to search for another provider that may want to prescribe him chronic pain meds if he is not safisfied with the agreement Additional Follow-up by: Lehman Prom FNP,  August 24, 2010 5:22 PM    Additional Follow-up for Phone Call Additional follow up Details #2::    Levon Hedger  August 30, 2010 2:33 PM Left message on machine for pt to return call to the office.  pt informed of above information.  Follow-up by: Levon Hedger,  August 30, 2010 2:51 PM

## 2010-10-21 NOTE — Letter (Signed)
Summary: RECEIVED RECORDS FROM [facility]  RECEIVED RECORDS FROM [facility]   Imported By: Arta Bruce 02/24/2010 12:35:20  _____________________________________________________________________  External Attachment:    Type:   Image     Comment:   External Document

## 2010-11-17 ENCOUNTER — Ambulatory Visit: Payer: Self-pay | Admitting: Gastroenterology

## 2010-12-04 LAB — COMPREHENSIVE METABOLIC PANEL
ALT: 70 U/L — ABNORMAL HIGH (ref 0–53)
ALT: 85 U/L — ABNORMAL HIGH (ref 0–53)
AST: 103 U/L — ABNORMAL HIGH (ref 0–37)
AST: 131 U/L — ABNORMAL HIGH (ref 0–37)
Albumin: 3.2 g/dL — ABNORMAL LOW (ref 3.5–5.2)
Alkaline Phosphatase: 32 U/L — ABNORMAL LOW (ref 39–117)
Alkaline Phosphatase: 36 U/L — ABNORMAL LOW (ref 39–117)
BUN: 4 mg/dL — ABNORMAL LOW (ref 6–23)
CO2: 28 mEq/L (ref 19–32)
Chloride: 104 mEq/L (ref 96–112)
GFR calc Af Amer: >60 mL/min (ref >60)
GFR calc Af Amer: >60 mL/min (ref >60)
Glucose, Bld: 99 mg/dL (ref 70–99)
Potassium: 3.6 mEq/L (ref 3.5–5.1)
Potassium: 3.7 mEq/L (ref 3.5–5.1)
Sodium: 138 mEq/L (ref 135–145)
Total Bilirubin: 0.7 mg/dL (ref 0.3–1.2)
Total Protein: 6.3 g/dL (ref 6.0–8.3)

## 2010-12-04 LAB — CBC
HCT: 43.9 % (ref 39.0–52.0)
HCT: 44.8 % (ref 39.0–52.0)
HCT: 49.7 % (ref 39.0–52.0)
Hemoglobin: 15.7 g/dL (ref 13.0–17.0)
Hemoglobin: 17.5 g/dL — ABNORMAL HIGH (ref 13.0–17.0)
MCHC: 35.2 g/dL (ref 30.0–36.0)
MCHC: 35.2 g/dL (ref 30.0–36.0)
MCV: 93.3 fL (ref 78.0–100.0)
Platelets: 255 10*3/uL (ref 150–400)
Platelets: 265 10*3/uL (ref 150–400)
RBC: 4.81 MIL/uL (ref 4.22–5.81)
RBC: 5.33 MIL/uL (ref 4.22–5.81)
RDW: 12.8 % (ref 11.5–15.5)
RDW: 12.9 % (ref 11.5–15.5)
WBC: 10.3 10*3/uL (ref 4.0–10.5)
WBC: 13.9 10*3/uL — ABNORMAL HIGH (ref 4.0–10.5)
WBC: 7.6 10*3/uL (ref 4.0–10.5)

## 2010-12-04 LAB — HEPATITIS PANEL, ACUTE
Hep B C IgM: NEGATIVE
Hepatitis B Surface Ag: NEGATIVE

## 2010-12-04 LAB — POCT I-STAT, CHEM 8
BUN: <3 mg/dL — ABNORMAL LOW (ref 6–23)
BUN: <3 mg/dL — ABNORMAL LOW (ref 6–23)
Calcium, Ion: 1.14 mmol/L (ref 1.12–1.32)
Chloride: 97 meq/L (ref 96–112)
Creatinine, Ser: 0.6 mg/dL (ref 0.4–1.5)
Glucose, Bld: 93 mg/dL (ref 70–99)
Glucose, Bld: 99 mg/dL (ref 70–99)
HCT: 54 % — ABNORMAL HIGH (ref 39.0–52.0)
Hemoglobin: 18.4 g/dL — ABNORMAL HIGH (ref 13.0–17.0)
Potassium: 4.2 meq/L (ref 3.5–5.1)
Sodium: 132 meq/L — ABNORMAL LOW (ref 135–145)
Sodium: 133 mEq/L — ABNORMAL LOW (ref 135–145)
TCO2: 28 mmol/L (ref 0–100)
TCO2: 29 mmol/L (ref 0–100)

## 2010-12-04 LAB — CYCLIC CITRUL PEPTIDE ANTIBODY, IGG: Cyclic Citrullin Peptide Ab: 1.1 U/mL (ref <7)

## 2010-12-04 LAB — GLUCOSE, CAPILLARY
Glucose-Capillary: 155 mg/dL — ABNORMAL HIGH (ref 70–99)
Glucose-Capillary: 163 mg/dL — ABNORMAL HIGH (ref 70–99)
Glucose-Capillary: 192 mg/dL — ABNORMAL HIGH (ref 70–99)
Glucose-Capillary: 193 mg/dL — ABNORMAL HIGH (ref 70–99)
Glucose-Capillary: 203 mg/dL — ABNORMAL HIGH (ref 70–99)
Glucose-Capillary: 89 mg/dL (ref 70–99)
Glucose-Capillary: 90 mg/dL (ref 70–99)

## 2010-12-04 LAB — DIFFERENTIAL
Basophils Absolute: 0 10*3/uL (ref 0.0–0.1)
Basophils Absolute: 0 10*3/uL (ref 0.0–0.1)
Basophils Relative: 0 % (ref 0–1)
Basophils Relative: 0 % (ref 0–1)
Eosinophils Absolute: 0.9 10*3/uL — ABNORMAL HIGH (ref 0.0–0.7)
Eosinophils Absolute: 0.9 10*3/uL — ABNORMAL HIGH (ref 0.0–0.7)
Eosinophils Relative: 11 % — ABNORMAL HIGH (ref 0–5)
Eosinophils Relative: 8 % — ABNORMAL HIGH (ref 0–5)
Lymphocytes Relative: 16 % (ref 12–46)
Lymphocytes Relative: 29 % (ref 12–46)
Lymphs Abs: 1.7 10*3/uL (ref 0.7–4.0)
Lymphs Abs: 2.2 10*3/uL (ref 0.7–4.0)
Monocytes Absolute: 0.6 10*3/uL (ref 0.1–1.0)
Monocytes Absolute: 1.1 10*3/uL — ABNORMAL HIGH (ref 0.1–1.0)
Monocytes Relative: 11 % (ref 3–12)
Monocytes Relative: 8 % (ref 3–12)
Neutro Abs: 3.8 10*3/uL (ref 1.7–7.7)
Neutro Abs: 6.7 10*3/uL (ref 1.7–7.7)
Neutrophils Relative %: 51 % (ref 43–77)
Neutrophils Relative %: 65 % (ref 43–77)

## 2010-12-04 LAB — URINALYSIS, ROUTINE W REFLEX MICROSCOPIC
Bilirubin Urine: NEGATIVE
Glucose, UA: NEGATIVE mg/dL
Hgb urine dipstick: NEGATIVE
Ketones, ur: 15 mg/dL — AB
Nitrite: NEGATIVE
Protein, ur: NEGATIVE mg/dL
Specific Gravity, Urine: 1.008 (ref 1.005–1.030)
Urobilinogen, UA: 0.2 mg/dL (ref 0.0–1.0)
pH: 7 (ref 5.0–8.0)

## 2010-12-04 LAB — MISCELLANEOUS TEST

## 2010-12-04 LAB — RAPID URINE DRUG SCREEN, HOSP PERFORMED
Amphetamines: NOT DETECTED
Barbiturates: NOT DETECTED
Benzodiazepines: NOT DETECTED
Cocaine: NOT DETECTED
Opiates: POSITIVE — AB
Tetrahydrocannabinol: NOT DETECTED

## 2010-12-04 LAB — SEDIMENTATION RATE: Sed Rate: 3 mm/hr (ref 0–16)

## 2010-12-04 LAB — CK
Total CK: 4522 U/L — ABNORMAL HIGH (ref 7–232)
Total CK: 5204 U/L — ABNORMAL HIGH (ref 7–232)

## 2010-12-04 LAB — ABO/RH: ABO/RH(D): A POS

## 2010-12-04 LAB — ANTI-SMOOTH MUSCLE ANTIBODY, IGG: F-Actin IgG: <20 U (ref <20)

## 2010-12-04 LAB — D-DIMER, QUANTITATIVE

## 2010-12-04 LAB — SJOGRENS SYNDROME-A EXTRACTABLE NUCLEAR ANTIBODY: SSA (Ro) (ENA) Antibody, IgG: <0.2 AI (ref <1.0)

## 2010-12-04 LAB — LIPID PANEL: VLDL: 11 mg/dL (ref 0–40)

## 2010-12-04 LAB — TSH: TSH: 0.574 u[IU]/mL (ref 0.350–4.500)

## 2010-12-04 LAB — TYPE AND SCREEN

## 2010-12-04 LAB — SJOGRENS SYNDROME-B EXTRACTABLE NUCLEAR ANTIBODY: SSB (La) (ENA) Antibody, IgG: <0.2 AI (ref <1.0)

## 2010-12-04 LAB — HIV ANTIBODY (ROUTINE TESTING W REFLEX): HIV: NONREACTIVE

## 2010-12-04 LAB — CK TOTAL AND CKMB (NOT AT ARMC): CK, MB: 72.2 ng/mL (ref 0.3–4.0)

## 2010-12-04 LAB — MAGNESIUM: Magnesium: 2 mg/dL (ref 1.5–2.5)

## 2010-12-05 LAB — CBC
Hemoglobin: 17.3 g/dL — ABNORMAL HIGH (ref 13.0–17.0)
MCHC: 35 g/dL (ref 30.0–36.0)
Platelets: 262 10*3/uL (ref 150–400)
RDW: 12.6 % (ref 11.5–15.5)

## 2010-12-05 LAB — POCT I-STAT, CHEM 8
BUN: 5 mg/dL — ABNORMAL LOW (ref 6–23)
Calcium, Ion: 1.16 mmol/L (ref 1.12–1.32)
Creatinine, Ser: 0.8 mg/dL (ref 0.4–1.5)
Glucose, Bld: 96 mg/dL (ref 70–99)
Sodium: 136 mEq/L (ref 135–145)
TCO2: 25 mmol/L (ref 0–100)

## 2010-12-05 LAB — CK: Total CK: 2571 U/L — ABNORMAL HIGH (ref 7–232)

## 2010-12-05 LAB — DIFFERENTIAL
Basophils Absolute: 0 10*3/uL (ref 0.0–0.1)
Basophils Relative: 1 % (ref 0–1)
Eosinophils Absolute: 1.5 10*3/uL — ABNORMAL HIGH (ref 0.0–0.7)
Neutro Abs: 5.2 10*3/uL (ref 1.7–7.7)
Neutrophils Relative %: 51 % (ref 43–77)

## 2011-01-31 NOTE — H&P (Signed)
[name], Good NO.:  [id number]   MEDICAL RECORD NO.:  [id number]          PATIENT TYPE:  IPS   LOCATION:  0305                          FACILITY:  [facility]   PHYSICIAN:  [name], M.D. DATE OF BIRTH:  09/14/1966   DATE OF ADMISSION:  12/03/2007  DATE OF DISCHARGE:                       PSYCHIATRIC ADMISSION ASSESSMENT   DATE OF ASSESSMENT:  December 04, 2007 at 8:55 a.m.   IDENTIFICATION:  A 45 year old white male, who is married.  This is a  voluntary admission.   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  He reports he is drinking [employer] every day.  Does not drink liquor, and also used some cocaine once to twice a week  in the last 3 weeks.  Says that the drinking starts first and then after  he has been drinking ill use, he will use the cocaine occasionally.  Says that he drank a total of three cases of beer just on Saturday and  Sunday of this past weekend.  He says that he has been drinking heavily  for about one months and this was precipitated by some arguments and  conflict that he had with his wife.  They are currently separated  because of the alcohol issue.  She is staying with children and he has  come to stay at his mother's house here in [place].  Abstinent period  is unclear.  He reports that during his good periods he will drink one  24-ounce beer every day after work.  He endorses a history of anxiety,  getting keyed up easily, but feels that he is better now that he has  steady work and has been employed at [employer] since 2005.   PAST PSYCHIATRIC HISTORY:  Fourth [facility] admission.  Last admission at  behavioral health July 11-14, 2008, also for detox from alcohol.  He  denies any history of suicidal thoughts.  Has been drinking alcohol  since he was in his teens and has a history of sweats, anxiety, some  formication at times, nausea and vomiting.  He reports that alcohol is  his drug of choice and use cocaine occasionally.  Has never taken  medications for cravings or has never taken disulfiram.   SOCIAL HISTORY:  Married 9 years.  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  Living with his mother here in  [place].  He does not have a driver's license for the past more than  10 years since he has a history of distant DWIs but no current legal  charges.  At one point he spent 18 months in jail for cashing a  fraudulent check that he had found.  No other legal problems in the  past.  Has a daughter 24 years of age, one 43-year-old son and a 18-month-  old son.  They are living with his wife in [place].  He reports  that there is primary motivation for regaining abstinence.  Wants to be  a good father and he is concerned about his physical health.   MEDICAL HISTORY:  He has no regular primary care [name].  He does have  a problem with chronic back pain and occasionally takes opiates.  He  also has a history of hepatitis C that he says he acquired in prison  through homemade tattoo services.   CURRENT MEDICATIONS:  None.   DRUG ALLERGIES:  DARVOCET, which has caused him nausea and abdominal  pain.   Physical exam was done in the emergency room.  This is a slight-built  white male, 5 feet 9 inches tall, 137 pounds.  Temperature 98.6, pulse  116, respirations 16, blood pressure 106/74, who appears to be in full  detox, flushed skin, fine motor tremor, anxious in appearance.  CIWA  score on admission was 8.   Diagnostic studies were done in the emergency room.  CBC:  WBC 8.3,  hemoglobin 15.9, hematocrit 46.6, platelets 266,000 and his MCV is 91.6.  Chemistry:  Sodium 132, potassium 4.1, chloride 96, carbon dioxide 27,  BUN 3, creatinine 0.64, random glucose 94.  Liver enzymes:  SGOT 60,  SGPT 43, alkaline phosphatase 37 and total bilirubin 0.7.   Alcohol level  238.  Urine drug screen was positive for cocaine.   MENTAL STATUS EXAM:  A fully alert male, anxious in appearance, quite  tremulous with slightly halting speech.  Speech itself is a little bit  tremulous.  Mood is anxious.  Thought process logical, coherent.  He is  oriented x4, in full contact with reality.  No signs of delirium or  confusion.  Denying suicidal thoughts.  Says that his main reason for  getting back to abstinence is to be able to be effective as a father,  wants to see his children grow up.  Also worried about the condition of  his liver.  Cognition is fully intact.   AXIS I:  1.  Ethanol abuse and dependence.  2.  Cocaine abuse, rule out  dependence.  AXIS II:  Deferred.  AXIS III:  __________ .  AXIS IV:  Severe issues with parenting and marital conflict.  AXIS V:  Current 40, past year 66.   PLAN:  Voluntarily admit the patient with 15-minute checks in place.  He  has given permission for the case manager to notify his employer that he  is here.  He has been started on a Librium protocol and will receive 25  mg of Librium q.i.d. today in tapering fashion along with medications  for symptom management, thiamine 100 mg daily and folic acid 1 mg daily.  We are going to give him some Robaxin for his back pain along with a K-  Pad on his bed, and he has trazodone 50 mg ordered nightly p.r.n.  insomnia, may repeat x1.  He says his wife will come for a family  session and we are attempting to get that scheduled now.  Estimated  length of stay is 5 days.  He is in agreement with the plan.      [name], N.P.      [name], M.D.  Electronically Signed    MAS/MEDQ  D:  12/04/2007  T:  12/05/2007  Job:  [id number]

## 2011-02-03 NOTE — Discharge Summary (Signed)
[name], [name] NO.:  [id number]   MEDICAL RECORD NO.:  [id number]          PATIENT TYPE:  IPS   LOCATION:  0501                          FACILITY:  [facility]   PHYSICIAN:  Jasmine Pang, M.D. DATE OF BIRTH:  March 17, 1966   DATE OF ADMISSION:  03/29/2007  DATE OF DISCHARGE:  04/01/2007                               DISCHARGE SUMMARY   IDENTIFYING INFORMATION:  This is a 45 year old white male who was  admitted on a voluntary basis on March 29, 2007.   HISTORY OF PRESENT ILLNESS:  The patient presents with a history of  alcohol abuse.  He is requesting detox.  His alcohol level was 367 on  the evening prior to admission.  There was no bed available at that time  and, when he represented to the ED, his alcohol level was 77.  He has  been drinking daily since November 21, 2006 with escalation since November 24, 2006 when he lost his job.  He has lost his home in the past few months  too.  He was previously on disulfiram.  He is now on no medications.  He  uses cocaine occasionally when intoxicated with alcohol.  He states this  is not frequent.   PAST PSYCHIATRIC HISTORY:  This is the third or fourth [facility] admission for  this patient.  He was last here in May of 2003.   MEDICAL HISTORY:  He has no medical problems.   MEDICATIONS:  He is on no medications.   ALLERGIES:  No known drug allergies.   PHYSICAL EXAMINATION:  There were no acute physical or medical problems  noted.   LABORATORY DATA:  Admission laboratories were done in the ED prior to  admission and reviewed by the ED physician.   HOSPITAL COURSE:  Upon admission, the patient was started on low-dose  Librium detox protocol.  He was also started on trazodone 50 mg p.o.  q.h.s. p.r.n. insomnia.  On March 31, 2007, the patient was given  Percocet 5/325 mg, 2 tablets now, then 1-2 tablets p.o. q.6h. p.r.n.  lumbosacral pain.  He was also given Aleve 500 mg p.o. b.i.d. x14 days.  The patient tolerated his  medications well with no significant side  effects.  He was able to participate appropriately in the unit  therapeutic groups and activities.  He was on the red team for chemical  dependence.  He was initially disheveled but had good eye contact.  Speech normal rate and flow.  There was some psychomotor retardation.  Mood was dysphoric.  Affect constricted.  He denied suicidal ideation.  As the hospitalization progressed, the patient's mental status improved.  On the day of discharge, April 01, 2007, the patient's mental status had  improved markedly from admission status.  He was friendly and  cooperative with good eye contact.  Speech was normal rate and flow.  Psychomotor activity was within normal limits.  His mood was euthymic.  Affect wide range.  No suicidal or homicidal ideation.  No thoughts of  self-injurious behavior.  No paranoia or delusions.  Thoughts were  logical and goal-directed.  Thought content no predominant theme.  No  auditory or visual hallucinations.  Cognitive was grossly back to  baseline.  It was felt the patient was safe to be discharged today.   DISCHARGE DIAGNOSES:  AXIS I:  Alcohol dependence.  Rule out depressive  disorder not otherwise specified.  AXIS II:  None.  AXIS III:  None.  AXIS IV:  Severe (issues with substance dependence, problems with  primary support group, problems related to social environment, other  psychosocial problems).  AXIS V:  GAF upon discharge 50; GAF upon admission 30; GAF highest past  year 58-62.   ACTIVITY/DIET:  There were no specific activity level or dietary  restrictions.   POST-HOSPITAL CARE PLANS:  The patient will be seen by Viviann Spare Ringer at  the [facility] on Wednesday, April 03, 2007 at 5:30 p.m.   DISCHARGE MEDICATIONS:  1. Ibuprofen 200 mg, take up to 4 tablets three times daily with food      for back pain for two weeks.  2. Percocet 5/325 mg, take 1-2 every six hours as needed for pain.  3. Trazodone 50  mg p.o. q.h.s. p.r.n. insomnia.  4. Librium 25 mg, take 1 at supper on the day of admission and 1 at      breakfast the following day, then detox will be complete.      Jasmine Pang, M.D.  Electronically Signed     BHS/MEDQ  D:  04/16/2007  T:  04/16/2007  Job:  [id number]
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
## 2011-02-03 NOTE — H&P (Signed)
[name], [name] NO.:  [id number]   MEDICAL RECORD NO.:  [id number]          PATIENT TYPE:  EMS   LOCATION:  MAJO                         FACILITY:  [facility]   PHYSICIAN:  [name], M.D.     DATE OF BIRTH:  23-Aug-1966   DATE OF ADMISSION:  02/24/2005  DATE OF DISCHARGE:                                HISTORY & PHYSICAL   REFERRING PHYSICIAN:  Brett Canales A. Cleta Good, M.D.   REASON FOR ADMISSION:  [name] is a 45 year old male, with no known history  of heart disease, who is now referred directly from his primary care  physician's office for evaluation of chest pain in the setting of an  abnormal electrocardiogram.   The patient had presented earlier this week at [facility]  emergency room with chest pain but left against medical advice.  Review of  lab data revealed normal cardiac markers.   The patient has history of polysubstance and, in fact, admitted to using  cocaine last evening.  However, he denies use of crack.   This morning, while at work at [employer], he began feeling dizzy and  was sent to his primary care physician's office.  He denied any associated  chest discomfort, diaphoresis, dyspnea or nausea with the initial dizziness.  However, at his doctor's office, he did develop some chest discomfort when  apprised of the news of an abnormal electrocardiogram possibly suggestive of  an acute myocardial infarction.  He was treated with baby aspirin and now  presents in transfer, via EMS.   The patient reports no chest discomfort on admission.  Electrocardiogram  shows hyperacute T-waves in the anterolateral leads with J-point elevation.  Comparison with electrocardiogram from June 5 shows slightly more prominent  ST abnormalities.   An echocardiogram at bedside revealed a normal left ventricle with no focal  wall motion abnormalities.   ALLERGIES:  None.   MEDICATIONS PRIOR TO ADMISSION:  None.   PAST MEDICAL HISTORY:  Status post  appendectomy, left inguinal hernia  repair.  Removal of a benign intestinal tumor (at age 23 weeks).   SOCIAL HISTORY:  The patient lives here in [place] with his wife.  They  have one child and one on the way.  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  He has been smoking at least 1-1/2 packs per day since the age of 3.  He also drinks anywhere from eight to 10 beers a night.  He uses hard liquor  only on occasion but did admit to drinking a pint several nights ago.  He  also uses marijuana occasionally and admitted to use of cocaine, but not  crack, last evening.   FAMILY HISTORY:  Father deceased at age 43, complications from throat  cancer.   REVIEW OF SYSTEMS:  Denies any recent evidence of upper or lower GI  bleeding.  Denies heartburn symptoms, denies dyspnea, PND, or pedal edema.  The remaining systems are negative.   PHYSICAL EXAMINATION:  VITAL SIGNS:  Blood pressure 124/81, pulse 71 on  admission.  GENERAL:  A  45 year old male in no apparent distress.  HEENT:  Normocephalic, atraumatic.  NECK:  Preserved bilateral pulses without bruits.  CHEST:  Lungs clear to auscultation in all fields.  CARDIAC:  Regular rate and rhythm (S1, S2), no murmurs, rubs or gallops.  ABDOMEN:  Soft, nontender, with intact bowel sounds without bruits.  EXTREMITIES:  Preserved bilateral femoral pulses without bruits; intact  distal pulses with no edema noted, no focal deficit.   Admission chest x-ray:  Pending.  Electrocardiogram:  Normal sinus rhythm at  68 BPM with normal axis; hyperacute T-waves and J-point elevation in the  anterolateral leads; J-point elevation inferiorly.   LABORATORY DATA:  Pending.   IMPRESSION:  1.  Recurrent chest pain with abnormal electrocardiogram.  2.  Polysubstance abuse.   PLAN:  The patient will be admitted and recommendation is to proceed with  diagnostic coronary angiography.  It is not clear if the chest pain is  secondary to coronary vasospasm.   Given the patient's history of  polysubstance abuse, we will defer from treating him with morphine and will  place him only on aspirin and Toradol p.r.n.  Beta blocker will be deferred  given the history of cocaine use.  We will also not start heparin at this  time.  Additional recommendations include the use of a closure device upon  completion of the catheterization, in the event that the patient attempts to  leave against medical advice once again.      [name]   GS/MEDQ  D:  02/24/2005  T:  02/24/2005  Job:  [id number]   cc:   [name], M.D.  [address]  [place]  [place] [address]  Fax: [phone]
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
## 2011-02-03 NOTE — Discharge Summary (Signed)
NAMECLARENCE, COGSWELL NO.:  [id number]   MEDICAL RECORD NO.:  [id number]          PATIENT TYPE:  INP   LOCATION:  2855                         FACILITY:  [facility]   PHYSICIAN:  Charlton Haws, M.D.     DATE OF BIRTH:  07/11/1966   DATE OF ADMISSION:  02/24/2005  DATE OF DISCHARGE:  02/24/2005                                 DISCHARGE SUMMARY   PROCEDURE:  Cardiac catheterization on February 24, 2005.   REASON FOR ADMISSION:  Mr. Musick is a 45 year old male, with no known history  of heart disease, referred directly from his primary care physician's  office, for evaluation of chest pain in the setting of an abnormal  electrocardiogram.   LABORATORY DATA:  Cardiac enzymes normal. Elevated AST of 60, ALT 53;  otherwise normal liver enzymes, electrolytes, renal function, and CBC.   ADMISSION CHEST X-RAY:  Negative.   HOSPITAL COURSE:  The patient was admitted for further evaluation of chest  pain. He had an abnormal electrocardiogram revealing hyperacute T-waves and  J-point elevation in the anterolateral leads. The patient also presented  with history of polysubstance abuse.   The cardiac catheterization, performed the same day by Dr. Samule Ohm, revealed  normal coronary arteries and a normal left ventricle. Additionally, Dr.  Samule Ohm noted no evidence of aortic dissection.   The patient was cleared for discharge later the same day.   The patient was discharged home on no medications. The patient was  instructed to arrange follow-up with his primary care physician, Dr. Lesle Chris.   DISCHARGE DIAGNOSES:  1.  Noncardiac chest pain.      1.  Normal coronary angiogram on February 24, 2005.  2.  History of polysubstance abuse.      Gene   GS/MEDQ  D:  04/18/2005  T:  04/18/2005  Job:  [id number]   cc:   Brett Canales A. Cleta Alberts, M.D.  [address]  [place]  [place] [address]  Fax: [phone]
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
## 2011-02-03 NOTE — Discharge Summary (Signed)
[facility]  Patient:    GERSON, FAUTH Visit Number: [id number] MRN: [id number]          Service Type: EMS Location: [facility] Attending Physician:  Sandi Raveling Dictated by:   Reymundo Poll Dub Mikes, M.D. Admit Date:  02/24/2002 Discharge Date: 02/24/2002                             Discharge Summary  CHIEF COMPLAINT AND PRESENTING ILLNESS:  This was the third admission to [facility] for this 45 year old male, voluntarily admitted for alcohol and substance dependency.  Alcohol and drug use, relapsed on alcohol, up to 10 40-ounce beers every day, smoking crack and marijuana.  Been smoking crack for 1 week, wanted to get his family back, having separated for 1 week due to his drinking.  Noncompliant with medications and followup therapy.  He denies any present suicidal or homicidal ideas or psychosis, very irritable, wants long-term rehab.  PAST PSYCHIATRIC HISTORY:  [facility], last time for detox, previous to that 1 year ago.  SUBSTANCE ABUSE HISTORY:  Drinking 10 40-ounce beers per day, has been drinking "forever."  His last drink was May 28.  No blackouts, no seizures. Marijuana, smoking crack.  PHYSICAL EXAMINATION:  Performed, failed to show any acute findings.  MENTAL STATUS EXAMINATION:  Reveals an alert young male, casually dressed, cooperative, little eye contact.  Speech is normal and relevant, mood is depressed and anxious, affect is appropriate to mood.  Thought processes are coherent and goal directed.  No suicidal or homicidal ideas, no auditory or visual hallucinations, no paranoia.  Cognition well preserved.  ADMITTING  DIAGNOSES: Axis I:    1. Alcohol dependence.            2. Polysubstance abuse.            3. Depressive disorder not otherwise specified. Axis II:   No diagnosis. Axis III:  No diagnosis. Axis IV:   Moderate. Axis V:    Global assessment of function upon admission 40, highest       global assessment of function in past year 55-60.  COURSE IN THE HOSPITAL:  He was admitted and started on intensive individual and group psychotherapy.  He was detoxified using Librium.  Detoxification went uneventfully.  He was looking to long-term abstinence.  He was aware of the mood swings, the depression, the anxiety.  He was willing to try an antidepressant and give it enough of a try.  He felt that he was this time going to make it.  He was going to go to the [facility].  He was going to work on his cravings.  He was willing to take medication for the cravings.  On May 30, he was fully detoxed.  He wanted to leave and resume his life.  No suicidal or homicidal ideas, going to go to [facility], therefore discharge was considered and granted.  DISCHARGE  DIAGNOSES: Axis I:    1. Alcohol dependence.            2. Polysubstance abuse.            3. Depressive disorder not otherwise specified. Axis II:   No diagnosis. Axis III:  No diagnosis. Axis IV:   Moderate. Axis V:    Global assessment of function upon discharge 50.  DISCHARGE MEDICATIONS: 1. Paxil CR 25 mg daily. 2. Revia 50 mg daily. 3. Antabuse  250 daily. 4. Trazodone 50 at bedtime.  DISPOSITION:  Follow with [facility]. Dictated by:   Reymundo Poll Dub Mikes, M.D. Attending Physician:  Sandi Raveling DD:  03/12/02 TD:  03/14/02 Job: [id number]
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
## 2011-02-03 NOTE — Cardiovascular Report (Signed)
[name], MOSTER NO.:  [id number]   MEDICAL RECORD NO.:  [id number]          PATIENT TYPE:  INP   LOCATION:                               FACILITY:  [facility]   PHYSICIAN:  Salvadore Farber, M.D. LHCDATE OF BIRTH:  1966-06-16   DATE OF PROCEDURE:  02/24/2005  DATE OF DISCHARGE:                              CARDIAC CATHETERIZATION   PROCEDURE:  Left heart catheterization, left ventriculography, coronary  angiography, arch aortography, StarClose closure of the right common femoral  arteriotomy site.   INDICATION:  Mr. Sao is a 45 year old gentleman without prior history of  cardiovascular disease.  The only risk factor is ongoing tobacco and cocaine  use.  He presented to the [facility] Emergency room on June 5 with chest  pain and left against medical advice.  One set of enzymes was negative  there.  He redeveloped chest discomfort at work today.  He had no associated  symptoms.  Electrocardiogram was unchanged from previous and demonstrated  approximately 0.5 mm of ST elevation and prominent T waves anteriorly.  This  was consistent but not diagnostic of hyperacute anterior myocardial  infarction.  He was, therefore, transferred urgently from Dr. Ellis Parents office  to the cardiac catheterization lab for angiography.  His most recent cocaine  use was last night.   PROCEDURAL TECHNIQUE:  Informed consent was obtained.  Under 1% lidocaine  local anesthesia, a #5 French sheath was placed in the right common femoral  artery using the modified Seldinger technique.  With this, the patient had a  vagal episode of blood pressure transiently dropping to the 70s.  He was  treated with one amp of atropine with prompt improvement in heart rate and  blood pressure.  Diagnostic angiography and ventriculography were performed  using JL4, JR4 and pigtail catheters.  The pigtail catheter was then  withdrawn into the aortic arch.  Arch aortography was performed by power  injection  to exclude aortic dissection.  The arteriotomy site was the closed  using a StarClose device.  Complete hemostasis was obtained.  He was then  transferred to the holding room in stable condition.   COMPLICATIONS:  Transient vagal episode treated with atropine with prompt  improvement.   FINDINGS:  1.  LV:  127/6/11.  EF 70% without regional wall motion abnormality.  2.  No aortic stenosis or mitral regurgitation.  3.  AORTIC ARCH:  No evidence of aortic dissection.  Normal origins of the      great vessels.  4.  LEFT MAIN:  Angiographically normal.  5.  LAD:  Moderate sized vessel giving rise to a single fairly large      diagonal.  It is angiographically normal.  6.  RAMUS INTERMEDIUS:  Moderate sized branching vessel.  It is      angiographically normal.  7.  CIRCUMFLEX:  Moderate sized vessel giving rise to a single obtuse      marginal.  It is angiographically normal.  8.  RCA:  Moderate sized dominant vessel.  It is angiographically normal.   IMPRESSION/RECOMMENDATIONS:  The  patient has angiographically normal  coronary arteries with normal left ventricular size and systolic function.  In addition, there is no evidence of aortic dissection.  He is now pain-  free.  I think his electrocardiogram is a normal variant and his chest pain is  likely noncardiac in etiology, though with his cocaine use, I cannot exclude  transient coronary spasm.  Cessation of cocaine use was strongly advised, as  was cessation of smoking.  The patient is to follow up with Dr. Cleta Alberts.       WED/MEDQ  D:  02/24/2005  T:  02/25/2005  Job:  [id number]   cc:   Brett Canales A. Cleta Alberts, M.D.  [address]  [place]  [place] [address]  Fax: [phone]   Charlton Haws, M.D.

## 2011-02-03 NOTE — H&P (Signed)
[facility]  Patient:    DEWEL, LOTTER Visit Number: [id number] MRN: [id number]          Service Type: EMS Location: ED Attending Physician:  Shelba Flake Dictated by:   Candi Leash. Orsini, N.P. Admit Date:  02/11/2002 Discharge Date: 02/12/2002                     Psychiatric Admission Assessment  IDENTIFYING INFORMATION:  This is a 45 year old married white male voluntarily admitted for alcohol and substance abuse on Feb 12, 2002.  HISTORY OF PRESENT ILLNESS:  The patient presents with a history of alcohol and drug use.  Relapsed on alcohol, drinking up to 10 40-ounce beers every day, smoking crack and marijuana.  The patient reports he has been smoking crack for one week.  He reports he wanted to get his family back.  He has been separated for one week due to his drinking.  He has been noncompliant with his medications and follow-up therapy.  He denies any depression, suicidal or homicidal ideation or psychosis.  He feels very irritable at present.  He wants long-term rehab after discharge.  PAST PSYCHIATRIC HISTORY:  Third visit to [facility].  Last visit was two months ago for alcohol detox.  He was to attend [facility] but did not follow through.  His longest history of sobriety has been one year in 1998.  He has no history of a suicide attempt.  SOCIAL HISTORY:  This is a 45 year old married white male, separated for one week, married for three years, second marriage.  He has a child, 33 months of age.  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  He has a court date pending for driving with a revoked license and obtaining false pretenses.  FAMILY HISTORY:  Father is an alcoholic.  ALCOHOL/DRUG HISTORY:  The patient smokes.  He has been drinking 10 40-ounce beers per day.  He states he has been drinking "forever."  His last drink was on Feb 12, 2002.  He has no history of blackouts, no history of seizures. Also been smoking  marijuana and smoking crack for about one week.  PRIMARY CARE Artemisia Auvil:  None.  MEDICAL PROBLEMS:  None.  MEDICATIONS:  The patient was on Paxil and trazodone.  Has been noncompliant.  DRUG ALLERGIES:  None.  PHYSICAL EXAMINATION:  Performed at [facility] Emergency Department.  LABORATORY DATA:  Alcohol level is 241.  CBC within normal limits.  SGOT is 41.  Urine drug screen is positive for marijuana, positive for cocaine.  MENTAL STATUS EXAMINATION:  He is an alert, young adult.  Casually dressed. Cooperative.  Little eye contact.  Speech is normal and relevant.  Mood is depressed, anxious.  Affect is appropriate to mood.  Thought processes are coherent, goal directed.  No suicidal or homicidal ideation.  No auditory or visual hallucinations.  No paranoia.  Cognitive function intact.  Memory is fair.  Judgment is poor.  Insight is fair.  Poor impulse control.  DIAGNOSES: Axis I:    1. Alcohol dependence.            2. Polysubstance abuse. Axis II:   Deferred. Axis III:  None. Axis IV:   Problems with primary support group, occupation, other psychosocial            problems. Axis V:    Current 40; this past year 65-70.  PLAN:  Voluntary admission to [facility] for alcohol dependence and polysubstance abuse.  Contract for safety.  Check every 15 minutes.  Will initiate a phenobarbital protocol.  Encourage fluids.  Resume Paxil. Medication compliance discussed with patient.  Caseworker to look at long-term rehab.  Remain alcohol and drug-free.  TENTATIVE LENGTH OF STAY:  Three to five days. Dictated by:   Candi Leash. Orsini, N.P. Attending Physician:  Shelba Flake DD:  02/14/02 TD:  02/16/02 Job: [id number]

## 2011-06-12 LAB — CBC
HCT: 46.6
Hemoglobin: 15.9
MCHC: 34.2
MCV: 91.6
Platelets: 266
RBC: 5.09
RDW: 12.8
WBC: 8.3

## 2011-06-12 LAB — COMPREHENSIVE METABOLIC PANEL
Albumin: 3.7
BUN: 3 — ABNORMAL LOW
Chloride: 96
Creatinine, Ser: 0.64
Total Bilirubin: 0.7

## 2011-06-12 LAB — RAPID URINE DRUG SCREEN, HOSP PERFORMED
Amphetamines: NOT DETECTED
Barbiturates: NOT DETECTED
Benzodiazepines: NOT DETECTED
Cocaine: POSITIVE — AB
Opiates: NOT DETECTED
Tetrahydrocannabinol: NOT DETECTED

## 2011-06-12 LAB — ETHANOL

## 2011-06-19 LAB — POCT I-STAT, CHEM 8
BUN: <3 — ABNORMAL LOW
Calcium, Ion: 1.1 — ABNORMAL LOW
Chloride: 99
Creatinine, Ser: 1
Glucose, Bld: 147 — ABNORMAL HIGH
HCT: 52
Hemoglobin: 17.7 — ABNORMAL HIGH
Potassium: 4.2
Sodium: 136
TCO2: 27

## 2011-06-19 LAB — URINALYSIS, ROUTINE W REFLEX MICROSCOPIC
Bilirubin Urine: NEGATIVE
Glucose, UA: NEGATIVE
Hgb urine dipstick: NEGATIVE
Ketones, ur: NEGATIVE
Nitrite: NEGATIVE
Protein, ur: NEGATIVE
Specific Gravity, Urine: 1.011
Urobilinogen, UA: 0.2
pH: 6.5

## 2011-07-04 LAB — URINALYSIS, ROUTINE W REFLEX MICROSCOPIC
Bilirubin Urine: NEGATIVE
Glucose, UA: NEGATIVE
Hgb urine dipstick: NEGATIVE
Ketones, ur: NEGATIVE
Nitrite: NEGATIVE
Nitrite: NEGATIVE
Protein, ur: NEGATIVE
Urobilinogen, UA: 0.2
pH: 6

## 2011-07-04 LAB — DIFFERENTIAL
Eosinophils Relative: 4
Lymphocytes Relative: 37
Lymphs Abs: 2.7
Monocytes Absolute: 0.9 — ABNORMAL HIGH
Monocytes Relative: 12 — ABNORMAL HIGH

## 2011-07-04 LAB — CBC
Hemoglobin: 16.4
MCHC: 35.6
MCV: 89.8
RBC: 5.11
WBC: 7.5

## 2011-07-04 LAB — RAPID URINE DRUG SCREEN, HOSP PERFORMED
Amphetamines: NOT DETECTED
Barbiturates: NOT DETECTED
Benzodiazepines: NOT DETECTED
Benzodiazepines: NOT DETECTED
Cocaine: POSITIVE — AB
Tetrahydrocannabinol: NOT DETECTED
Tetrahydrocannabinol: NOT DETECTED

## 2011-07-04 LAB — BASIC METABOLIC PANEL
BUN: 4 — ABNORMAL LOW
CO2: 26
Calcium: 9.1
Calcium: 9.4
Chloride: 101
Creatinine, Ser: 0.83
GFR calc Af Amer: >60
GFR calc non Af Amer: >60
Sodium: 138

## 2011-07-04 LAB — HEPATIC FUNCTION PANEL
AST: 50 — ABNORMAL HIGH
Albumin: 3.3 — ABNORMAL LOW

## 2011-07-04 LAB — TSH: TSH: 0.88

## 2011-07-04 LAB — ETHANOL
Alcohol, Ethyl (B): 284 — ABNORMAL HIGH
Alcohol, Ethyl (B): 367 — ABNORMAL HIGH
Alcohol, Ethyl (B): 77 — ABNORMAL HIGH

## 2011-07-04 LAB — HEPATITIS PANEL, ACUTE: Hep A IgM: NEGATIVE

## 2011-09-19 DIAGNOSIS — R7989 Other specified abnormal findings of blood chemistry: Secondary | ICD-10-CM

## 2011-09-19 HISTORY — DX: Other specified abnormal findings of blood chemistry: R79.89

## 2011-10-17 ENCOUNTER — Other Ambulatory Visit: Payer: Self-pay

## 2011-10-17 ENCOUNTER — Inpatient Hospital Stay (HOSPITAL_COMMUNITY)
Admission: EM | Admit: 2011-10-17 | Discharge: 2011-10-19 | DRG: 917 | Disposition: A | Discharge status: Home / Self Care | Payer: Medicaid Other | Attending: Internal Medicine | Admitting: Internal Medicine

## 2011-10-17 ENCOUNTER — Encounter (HOSPITAL_COMMUNITY): Payer: Self-pay | Admitting: *Deleted

## 2011-10-17 ENCOUNTER — Emergency Department (HOSPITAL_COMMUNITY): Payer: Medicaid Other

## 2011-10-17 DIAGNOSIS — Z23 Encounter for immunization: Secondary | ICD-10-CM

## 2011-10-17 DIAGNOSIS — F17200 Nicotine dependence, unspecified, uncomplicated: Secondary | ICD-10-CM

## 2011-10-17 DIAGNOSIS — M549 Dorsalgia, unspecified: Secondary | ICD-10-CM

## 2011-10-17 DIAGNOSIS — T400X1A Poisoning by opium, accidental (unintentional), initial encounter: Secondary | ICD-10-CM | POA: Diagnosis present

## 2011-10-17 DIAGNOSIS — E870 Hyperosmolality and hypernatremia: Secondary | ICD-10-CM

## 2011-10-17 DIAGNOSIS — T481X4A Poisoning by skeletal muscle relaxants [neuromuscular blocking agents], undetermined, initial encounter: Principal | ICD-10-CM | POA: Diagnosis present

## 2011-10-17 DIAGNOSIS — M3320 Polymyositis, organ involvement unspecified: Secondary | ICD-10-CM | POA: Diagnosis present

## 2011-10-17 DIAGNOSIS — R7989 Other specified abnormal findings of blood chemistry: Secondary | ICD-10-CM

## 2011-10-17 DIAGNOSIS — R4182 Altered mental status, unspecified: Secondary | ICD-10-CM

## 2011-10-17 DIAGNOSIS — G894 Chronic pain syndrome: Secondary | ICD-10-CM

## 2011-10-17 DIAGNOSIS — F329 Major depressive disorder, single episode, unspecified: Secondary | ICD-10-CM | POA: Diagnosis present

## 2011-10-17 DIAGNOSIS — T48201A Poisoning by unspecified drugs acting on muscles, accidental (unintentional), initial encounter: Secondary | ICD-10-CM | POA: Diagnosis present

## 2011-10-17 DIAGNOSIS — M79609 Pain in unspecified limb: Secondary | ICD-10-CM

## 2011-10-17 DIAGNOSIS — T510X4A Toxic effect of ethanol, undetermined, initial encounter: Secondary | ICD-10-CM | POA: Diagnosis present

## 2011-10-17 DIAGNOSIS — F1010 Alcohol abuse, uncomplicated: Secondary | ICD-10-CM

## 2011-10-17 DIAGNOSIS — R259 Unspecified abnormal involuntary movements: Secondary | ICD-10-CM | POA: Diagnosis present

## 2011-10-17 DIAGNOSIS — R000 Tachycardia, unspecified: Secondary | ICD-10-CM

## 2011-10-17 DIAGNOSIS — T510X1A Toxic effect of ethanol, accidental (unintentional), initial encounter: Secondary | ICD-10-CM | POA: Diagnosis present

## 2011-10-17 DIAGNOSIS — F1020 Alcohol dependence, uncomplicated: Secondary | ICD-10-CM | POA: Diagnosis present

## 2011-10-17 DIAGNOSIS — T40601A Poisoning by unspecified narcotics, accidental (unintentional), initial encounter: Secondary | ICD-10-CM | POA: Diagnosis present

## 2011-10-17 DIAGNOSIS — B1710 Acute hepatitis C without hepatic coma: Secondary | ICD-10-CM

## 2011-10-17 DIAGNOSIS — F3289 Other specified depressive episodes: Secondary | ICD-10-CM | POA: Diagnosis present

## 2011-10-17 DIAGNOSIS — IMO0001 Reserved for inherently not codable concepts without codable children: Secondary | ICD-10-CM

## 2011-10-17 DIAGNOSIS — G9340 Encephalopathy, unspecified: Secondary | ICD-10-CM

## 2011-10-17 DIAGNOSIS — F10929 Alcohol use, unspecified with intoxication, unspecified: Secondary | ICD-10-CM

## 2011-10-17 DIAGNOSIS — E860 Dehydration: Secondary | ICD-10-CM

## 2011-10-17 DIAGNOSIS — K7030 Alcoholic cirrhosis of liver without ascites: Secondary | ICD-10-CM | POA: Diagnosis present

## 2011-10-17 DIAGNOSIS — R7309 Other abnormal glucose: Secondary | ICD-10-CM | POA: Diagnosis present

## 2011-10-17 DIAGNOSIS — R159 Full incontinence of feces: Secondary | ICD-10-CM | POA: Diagnosis present

## 2011-10-17 DIAGNOSIS — E871 Hypo-osmolality and hyponatremia: Secondary | ICD-10-CM | POA: Diagnosis present

## 2011-10-17 DIAGNOSIS — B1920 Unspecified viral hepatitis C without hepatic coma: Secondary | ICD-10-CM

## 2011-10-17 DIAGNOSIS — M629 Disorder of muscle, unspecified: Secondary | ICD-10-CM

## 2011-10-17 DIAGNOSIS — G9349 Other encephalopathy: Secondary | ICD-10-CM | POA: Diagnosis present

## 2011-10-17 DIAGNOSIS — R748 Abnormal levels of other serum enzymes: Secondary | ICD-10-CM

## 2011-10-17 DIAGNOSIS — Z720 Tobacco use: Secondary | ICD-10-CM | POA: Diagnosis present

## 2011-10-17 DIAGNOSIS — Z7189 Other specified counseling: Secondary | ICD-10-CM

## 2011-10-17 HISTORY — DX: Myositis, unspecified: M60.9

## 2011-10-17 HISTORY — DX: Unspecified cirrhosis of liver: K74.60

## 2011-10-17 HISTORY — DX: Other specified abnormal findings of blood chemistry: R79.89

## 2011-10-17 HISTORY — DX: Unspecified viral hepatitis C without hepatic coma: B19.20

## 2011-10-17 HISTORY — DX: Cocaine abuse, in remission: F14.11

## 2011-10-17 HISTORY — DX: Major depressive disorder, single episode, unspecified: F32.9

## 2011-10-17 HISTORY — DX: Tobacco use: Z72.0

## 2011-10-17 HISTORY — DX: Other specified disorders of pigmentation: L81.8

## 2011-10-17 HISTORY — DX: Polymyositis, organ involvement unspecified: M33.20

## 2011-10-17 HISTORY — DX: Chronic pain syndrome: G89.4

## 2011-10-17 HISTORY — DX: Alcohol abuse, uncomplicated: F10.10

## 2011-10-17 HISTORY — DX: Depression, unspecified: F32.A

## 2011-10-17 LAB — URINALYSIS, ROUTINE W REFLEX MICROSCOPIC
Glucose, UA: NEGATIVE mg/dL
Ketones, ur: NEGATIVE mg/dL
Leukocytes, UA: NEGATIVE
Specific Gravity, Urine: 1.01 (ref 1.005–1.030)
pH: 7 (ref 5.0–8.0)

## 2011-10-17 LAB — DIFFERENTIAL
Basophils Absolute: 0.1 10*3/uL (ref 0.0–0.1)
Lymphocytes Relative: 35 % (ref 12–46)
Lymphs Abs: 3.4 10*3/uL (ref 0.7–4.0)
Monocytes Relative: 11 % (ref 3–12)

## 2011-10-17 LAB — TYPE AND SCREEN
ABO/RH(D): A POS
Antibody Screen: NEGATIVE

## 2011-10-17 LAB — CBC
Platelets: 252 10*3/uL (ref 150–400)
RDW: 13.5 % (ref 11.5–15.5)
WBC: 9.8 10*3/uL (ref 4.0–10.5)

## 2011-10-17 LAB — COMPREHENSIVE METABOLIC PANEL
Albumin: 3.6 g/dL (ref 3.5–5.2)
Alkaline Phosphatase: 61 U/L (ref 39–117)
BUN: 4 mg/dL — ABNORMAL LOW (ref 6–23)
Potassium: 5 mEq/L (ref 3.5–5.1)
Sodium: 148 mEq/L — ABNORMAL HIGH (ref 135–145)
Total Protein: 7.5 g/dL (ref 6.0–8.3)

## 2011-10-17 LAB — CARDIAC PANEL(CRET KIN+CKTOT+MB+TROPI)
CK, MB: 18 ng/mL (ref 0.3–4.0)
Total CK: 403 U/L — ABNORMAL HIGH (ref 7–232)

## 2011-10-17 LAB — BASIC METABOLIC PANEL
CO2: 25 mEq/L (ref 19–32)
Calcium: 8.8 mg/dL (ref 8.4–10.5)
GFR calc non Af Amer: >90 mL/min (ref >90)
Potassium: 4.1 mEq/L (ref 3.5–5.1)
Sodium: 143 mEq/L (ref 135–145)

## 2011-10-17 LAB — ETHANOL: Alcohol, Ethyl (B): 117 mg/dL — ABNORMAL HIGH (ref 0–11)

## 2011-10-17 LAB — ACETAMINOPHEN LEVEL: Acetaminophen (Tylenol), Serum: <15 ug/mL (ref 10–30)

## 2011-10-17 LAB — SALICYLATE LEVEL: Salicylate Lvl: <2 mg/dL — ABNORMAL LOW (ref 2.8–20.0)

## 2011-10-17 LAB — MAGNESIUM: Magnesium: 1.9 mg/dL (ref 1.5–2.5)

## 2011-10-17 LAB — CK TOTAL AND CKMB (NOT AT ARMC)
CK, MB: 19 ng/mL (ref 0.3–4.0)
Relative Index: 4.9 — ABNORMAL HIGH (ref 0.0–2.5)

## 2011-10-17 LAB — RAPID URINE DRUG SCREEN, HOSP PERFORMED: Benzodiazepines: NOT DETECTED

## 2011-10-17 MED ORDER — SODIUM CHLORIDE 0.9 % IV SOLN
Freq: Once | INTRAVENOUS | Status: AC
Start: 2011-10-17 — End: 2011-10-17
  Administered 2011-10-17: 15:00:00 via INTRAVENOUS

## 2011-10-17 MED ORDER — M.V.I. ADULT IV INJ
INJECTION | INTRAVENOUS | Status: AC
  Filled 2011-10-17: qty 10

## 2011-10-17 MED ORDER — THIAMINE HCL 100 MG/ML IJ SOLN
INTRAMUSCULAR | Status: AC
  Filled 2011-10-17: qty 2

## 2011-10-17 MED ORDER — FOLIC ACID 5 MG/ML IJ SOLN
INTRAMUSCULAR | Status: AC
  Filled 2011-10-17: qty 0.2

## 2011-10-17 MED ORDER — ALBUTEROL SULFATE (5 MG/ML) 0.5% IN NEBU: 2.5000 mg | INHALATION_SOLUTION | RESPIRATORY_TRACT | Status: DC | PRN

## 2011-10-17 MED ORDER — SODIUM CHLORIDE 0.9 % IV BOLUS (SEPSIS)
1000.0000 mL | Freq: Once | INTRAVENOUS | Status: AC
  Administered 2011-10-17: 1000 mL via INTRAVENOUS

## 2011-10-17 MED ORDER — LORAZEPAM 0.5 MG PO TABS: 1.0000 mg | ORAL_TABLET | Freq: Four times a day (QID) | ORAL | Status: DC | PRN

## 2011-10-17 MED ORDER — LORAZEPAM 2 MG/ML IJ SOLN
1.0000 mg | Freq: Once | INTRAMUSCULAR | Status: AC
  Administered 2011-10-17: 1 mg via INTRAVENOUS

## 2011-10-17 MED ORDER — MORPHINE SULFATE 4 MG/ML IJ SOLN
4.0000 mg | INTRAMUSCULAR | Status: DC | PRN
  Administered 2011-10-18: 4 mg via INTRAVENOUS
  Filled 2011-10-17: qty 1

## 2011-10-17 MED ORDER — NALOXONE HCL 1 MG/ML IJ SOLN
2.0000 mg | Freq: Once | INTRAMUSCULAR | Status: AC
  Administered 2011-10-17: 2 mg via INTRAVENOUS
  Filled 2011-10-17: qty 2

## 2011-10-17 MED ORDER — LORAZEPAM 2 MG/ML IJ SOLN
INTRAMUSCULAR | Status: AC
  Filled 2011-10-17: qty 1

## 2011-10-17 MED ORDER — ACETAMINOPHEN 325 MG PO TABS: 650.0000 mg | ORAL_TABLET | Freq: Four times a day (QID) | ORAL | Status: DC | PRN

## 2011-10-17 MED ORDER — ONDANSETRON HCL 4 MG/2ML IJ SOLN
4.0000 mg | Freq: Once | INTRAMUSCULAR | Status: AC
  Administered 2011-10-17: 4 mg via INTRAVENOUS
  Filled 2011-10-17: qty 2

## 2011-10-17 MED ORDER — ACETAMINOPHEN 650 MG RE SUPP: 650.0000 mg | Freq: Four times a day (QID) | RECTAL | Status: DC | PRN

## 2011-10-17 MED ORDER — HALOPERIDOL LACTATE 5 MG/ML IJ SOLN
2.5000 mg | INTRAMUSCULAR | Status: DC | PRN
  Administered 2011-10-18 – 2011-10-19 (×2): 2.5 mg via INTRAVENOUS
  Filled 2011-10-17 (×2): qty 1

## 2011-10-17 MED ORDER — ONDANSETRON HCL 4 MG PO TABS: 4.0000 mg | ORAL_TABLET | Freq: Four times a day (QID) | ORAL | Status: DC | PRN

## 2011-10-17 MED ORDER — ALUM & MAG HYDROXIDE-SIMETH 200-200-20 MG/5ML PO SUSP: 30.0000 mL | Freq: Four times a day (QID) | ORAL | Status: DC | PRN

## 2011-10-17 MED ORDER — KCL IN DEXTROSE-NACL 20-5-0.45 MEQ/L-%-% IV SOLN
INTRAVENOUS | Status: DC
  Administered 2011-10-18: 04:00:00 via INTRAVENOUS

## 2011-10-17 MED ORDER — THIAMINE HCL 100 MG/ML IJ SOLN
Freq: Once | INTRAVENOUS | Status: AC
  Administered 2011-10-17: 19:00:00 via INTRAVENOUS
  Filled 2011-10-17: qty 1000

## 2011-10-17 MED ORDER — ADULT MULTIVITAMIN W/MINERALS CH
1.0000 | ORAL_TABLET | Freq: Every day | ORAL | Status: DC
  Administered 2011-10-18 – 2011-10-19 (×2): 1 via ORAL
  Filled 2011-10-17 (×2): qty 1

## 2011-10-17 MED ORDER — PANTOPRAZOLE SODIUM 40 MG IV SOLR
40.0000 mg | Freq: Once | INTRAVENOUS | Status: AC
  Administered 2011-10-17: 40 mg via INTRAVENOUS
  Filled 2011-10-17: qty 40

## 2011-10-17 MED ORDER — ALBUTEROL SULFATE (5 MG/ML) 0.5% IN NEBU
2.5000 mg | INHALATION_SOLUTION | Freq: Once | RESPIRATORY_TRACT | Status: AC
  Administered 2011-10-17: 2.5 mg via RESPIRATORY_TRACT
  Filled 2011-10-17: qty 0.5

## 2011-10-17 MED ORDER — LORAZEPAM 2 MG/ML IJ SOLN
1.0000 mg | Freq: Four times a day (QID) | INTRAMUSCULAR | Status: DC | PRN
  Administered 2011-10-17 – 2011-10-19 (×5): 1 mg via INTRAVENOUS
  Filled 2011-10-17 (×5): qty 1

## 2011-10-17 MED ORDER — SODIUM CHLORIDE 0.45 % IV SOLN: INTRAVENOUS | Status: DC

## 2011-10-17 MED ORDER — LORAZEPAM 2 MG/ML IJ SOLN
INTRAMUSCULAR | Status: AC
  Administered 2011-10-17: 1 mg via INTRAVENOUS
  Filled 2011-10-17: qty 1

## 2011-10-17 MED ORDER — ONDANSETRON HCL 4 MG/2ML IJ SOLN: 4.0000 mg | Freq: Four times a day (QID) | INTRAMUSCULAR | Status: DC | PRN

## 2011-10-17 MED ORDER — PANTOPRAZOLE SODIUM 40 MG IV SOLR
40.0000 mg | INTRAVENOUS | Status: DC
  Administered 2011-10-18 – 2011-10-19 (×2): 40 mg via INTRAVENOUS
  Filled 2011-10-17 (×2): qty 40

## 2011-10-17 MED ORDER — THIAMINE HCL 100 MG/ML IJ SOLN
100.0000 mg | Freq: Every day | INTRAMUSCULAR | Status: DC
  Administered 2011-10-18: 100 mg via INTRAVENOUS
  Filled 2011-10-17: qty 2

## 2011-10-17 NOTE — ED Notes (Signed)
No change in pt status after receiving Narcan.  Pt appears more relaxed, no longer twitching, HR has decreased, pt resting comfortably, equal bil chest rise and fall.  nad noted.

## 2011-10-17 NOTE — Progress Notes (Signed)
Dr Orvan Falconer updated on Pt's ckmb=19
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
## 2011-10-17 NOTE — ED Notes (Signed)
Per EMS - family called stating pt drank approx a 5th of liquor since last night.  Family reported that pt went to bathroom and came back with bloody stool running down legs.  Pt smells of ETOH, will not answer questions, alert to verbal stimuli.

## 2011-10-17 NOTE — H&P (Addendum)
[name] MRN: [id number] DOB/AGE: [birth year] 46 y.o. Primary Care Physician:No primary provider on file. Admit date: 10/17/2011 Chief Complaint: Reported rectal bleeding which was not evident. The chief complaint per the ED personnel is altered mental status. HPI: The patient is a 46 year old man with a past medical history significant for some type of muscular disorder, depression, chronic pain syndrome, and alcohol abuse, who presents to the emergency department via EMS with altered mental status. It was reported that EMS was called by the patient's mother for rectal bleeding. However, there was no evidence of rectal bleeding when a rectal temperature was taken. Later, the patient's wife came to the ED and reported that the patient had been acting confused at home according to what was told to her by his mother. Neither Bonita Quin nor his mother is present. A phone call to his wife went unanswered. Based on the history that was provided to me by the emergency department staff, the patient had been acting strangely at home. He had been confused and walking around in circles this morning. He was incontinent of stool. There was a fall. He hit his head. There was no report of loss of consciousness. He reportedly drank a fifth of alcohol either this morning or last night. For 2 weeks, he had been binging on alcohol after being sober for approximately one month.  When the patient arrived to the emergency department, he was reportedly intoxicated and would not answer questions or follow commands. When he was being examined and when an IV line and venous punctures were attempted, the patient became combative and agitated. He began twitching. He began spitting at the nursing staff. He was given Narcan which did not help. He was subsequently given Ativan x4. He is now sedated and snoring. His lab data are significant for a serum sodium of 148, AST of 123, ALT of 99, salicylate level of less than 2, acetaminophen level of  less than 15, and alcohol level of 117. His urine drug screen is positive for opiates only. The CT scan of his head reveals no acute intracranial abnormalities. He is tachycardic and afebrile. He is being admitted for further evaluation and management.  Past Medical History  Diagnosis Date  . Hepatitis C   . Muscle disease     biopsy on shoulder last week  . Cirrhosis   . Chronic pain syndrome   . 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 Alcohol abuse     Psychiatric admissions for alcohol and drug abuse  . History of cocaine abuse   . Depression     Multiple psychiatric admissions  . Tobacco abuse     Past Surgical History  Procedure Date  . Hernia repair   . Muscle biopsy   . Cardiac catheterization June 2006    Normal coronary arteries    Prior to Admission medications   Not on File  Reported medications include baclofen, tramadol, and hydrocodone.  Allergies:  Allergies  Allergen Reactions  . Propoxyphene N-Acetaminophen     REACTION: vomiting    Family history: Per previous history, his father died of throat cancer. His sister has systemic lupus erythematous. His mother is alive and has hypertension.  Social History: He is married but apparently lives with his mother. He has 3 children. He drinks and smokes cigarettes heavily. Quantity unknown. He has a history of abusing cocaine.    ROS: Unobtainable as the patient is obtunded.  PHYSICAL EXAM: Blood pressure 148/106, pulse 126, temperature 98.4 F (36.9 C), temperature  source Rectal, resp. rate 14, SpO2 98.00%. General: Average frame and 46 year old white man who is lying in bed, asleep, sedated, obtunded. He is snoring. HEENT: His head is normocephalic and nontraumatic. No evidence of bumps or bruises. His pupils are small and minimally reactive to light. Extraocular movements appear intact. Sclerae are white and conjunctivae are mildly injected. Oropharynx reveals dry mucous membranes. No posterior exudates or  erythema. Neck: Supple, no adenopathy, no thyromegaly, no JVD. Lungs: Clear to auscultation bilaterally. Heart: S1, S2, with mild tachycardia. Abdomen: Well-healed keloid scar on the right lower quadrant and abdominal wall. Bowel sounds are present. Soft, nontender, nondistended. Extremities: There is a healing scar over the left deltoid presently from the recent biopsy. Pedal pulses are palpable bilaterally. No pedal edema. Skin: He has multiple tattoos on his arms bilaterally and one tattoo on his chest. Neurologic: He is sedated/obtunded. He does not respond to mild stimuli, but does grimace to further stimuli. He is protecting his airway. His cranial nerves appear to be grossly intact.  Basic Metabolic Panel:  Basename 10/17/11 1410  NA 148*  K 5.0  CL 106  CO2 29  GLUCOSE 119*  BUN 4*  CREATININE 0.49*  CALCIUM 9.9  MG --  PHOS --   Liver Function Tests:  Basename 10/17/11 1410  AST 123*  ALT 99*  ALKPHOS 61  BILITOT 0.3  PROT 7.5  ALBUMIN 3.6   No results found for this basename: LIPASE:2,AMYLASE:2 in the last 72 hours No results found for this basename: AMMONIA:2 in the last 72 hours CBC:  Basename 10/17/11 1410  WBC 9.8  NEUTROABS 5.0  HGB 16.3  HCT 45.9  MCV 88.4  PLT 252   Cardiac Enzymes: No results found for this basename: CKTOTAL:3,CKMB:3,CKMBINDEX:3,TROPONINI:3 in the last 72 hours BNP: No results found for this basename: PROBNP:3 in the last 72 hours D-Dimer: No results found for this basename: DDIMER:2 in the last 72 hours CBG: No results found for this basename: GLUCAP:6 in the last 72 hours Hemoglobin A1C: No results found for this basename: HGBA1C in the last 72 hours Fasting Lipid Panel: No results found for this basename: CHOL,HDL,LDLCALC,TRIG,CHOLHDL,LDLDIRECT in the last 72 hours Thyroid Function Tests: No results found for this basename: TSH,T4TOTAL,FREET4,T3FREE,THYROIDAB in the last 72 hours Anemia Panel: No results found for this  basename: VITAMINB12,FOLATE,FERRITIN,TIBC,IRON,RETICCTPCT in the last 72 hours Coagulation: No results found for this basename: LABPROT:2,INR:2 in the last 72 hours Urine Drug Screen: Drugs of Abuse     Component Value Date/Time   LABOPIA POSITIVE* 10/17/2011 1436   COCAINSCRNUR NONE DETECTED 10/17/2011 1436   LABBENZ NONE DETECTED 10/17/2011 1436   AMPHETMU NONE DETECTED 10/17/2011 1436   THCU NONE DETECTED 10/17/2011 1436   LABBARB NONE DETECTED 10/17/2011 1436    Alcohol Level:  Basename 10/17/11 1410  ETH 117*    Misc. Labs:   EKG: Sinus tachycardia with a heart rate of 113 beats per minute, possible anterior Q waves.  No results found for this or any previous visit (from the past 240 hour(s)).   Results for orders placed during the hospital encounter of 10/17/11 (from the past 48 hour(s))  CBC     Status: Normal   Collection Time   10/17/11  2:10 PM      Component Value Range Comment   WBC 9.8  4.0 - 10.5 (K/uL)    RBC 5.19  4.22 - 5.81 (MIL/uL)    Hemoglobin 16.3  13.0 - 17.0 (g/dL)    HCT 45.9  39.0 - 52.0 (%)    MCV 88.4  78.0 - 100.0 (fL)    MCH 31.4  26.0 - 34.0 (pg)    MCHC 35.5  30.0 - 36.0 (g/dL)    RDW 16.1  09.6 - 04.5 (%)    Platelets 252  150 - 400 (K/uL)   DIFFERENTIAL     Status: Abnormal   Collection Time   10/17/11  2:10 PM      Component Value Range Comment   Neutrophils Relative 51  43 - 77 (%)    Lymphocytes Relative 35  12 - 46 (%)    Monocytes Relative 11  3 - 12 (%)    Eosinophils Relative 2  0 - 5 (%)    Basophils Relative 1  0 - 1 (%)    Neutro Abs 5.0  1.7 - 7.7 (K/uL)    Lymphs Abs 3.4  0.7 - 4.0 (K/uL)    Monocytes Absolute 1.1 (*) 0.1 - 1.0 (K/uL)    Eosinophils Absolute 0.2  0.0 - 0.7 (K/uL)    Basophils Absolute 0.1  0.0 - 0.1 (K/uL)    WBC Morphology WHITE COUNT CONFIRMED ON SMEAR      Smear Review LARGE PLATELETS PRESENT     COMPREHENSIVE METABOLIC PANEL     Status: Abnormal   Collection Time   10/17/11  2:10 PM      Component  Value Range Comment   Sodium 148 (*) 135 - 145 (mEq/L)    Potassium 5.0  3.5 - 5.1 (mEq/L)    Chloride 106  96 - 112 (mEq/L)    CO2 29  19 - 32 (mEq/L)    Glucose, Bld 119 (*) 70 - 99 (mg/dL)    BUN 4 (*) 6 - 23 (mg/dL)    Creatinine, Ser 4.09 (*) 0.50 - 1.35 (mg/dL)    Calcium 9.9  8.4 - 10.5 (mg/dL)    Total Protein 7.5  6.0 - 8.3 (g/dL)    Albumin 3.6  3.5 - 5.2 (g/dL)    AST 811 (*) 0 - 37 (U/L)    ALT 99 (*) 0 - 53 (U/L)    Alkaline Phosphatase 61  39 - 117 (U/L)    Total Bilirubin 0.3  0.3 - 1.2 (mg/dL)    GFR calc non Af Amer >90  >90 (mL/min)    GFR calc Af Amer >90  >90 (mL/min)   ETHANOL     Status: Abnormal   Collection Time   10/17/11  2:10 PM      Component Value Range Comment   Alcohol, Ethyl (B) 117 (*) 0 - 11 (mg/dL)   TYPE AND SCREEN     Status: Normal   Collection Time   10/17/11  2:10 PM      Component Value Range Comment   ABO/RH(D) A POS      Antibody Screen NEG      Sample Expiration 10/20/2011     ACETAMINOPHEN LEVEL     Status: Normal   Collection Time   10/17/11  2:10 PM      Component Value Range Comment   Acetaminophen (Tylenol), Serum <15.0  10 - 30 (ug/mL)   SALICYLATE LEVEL     Status: Abnormal   Collection Time   10/17/11  2:10 PM      Component Value Range Comment   Salicylate Lvl <2.0 (*) 2.8 - 20.0 (mg/dL)   URINALYSIS, ROUTINE W REFLEX MICROSCOPIC     Status: Normal   Collection Time  10/17/11  2:36 PM      Component Value Range Comment   Color, Urine YELLOW  YELLOW     APPearance CLEAR  CLEAR     Specific Gravity, Urine 1.010  1.005 - 1.030     pH 7.0  5.0 - 8.0     Glucose, UA NEGATIVE  NEGATIVE (mg/dL)    Hgb urine dipstick NEGATIVE  NEGATIVE     Bilirubin Urine NEGATIVE  NEGATIVE     Ketones, ur NEGATIVE  NEGATIVE (mg/dL)    Protein, ur NEGATIVE  NEGATIVE (mg/dL)    Urobilinogen, UA 0.2  0.0 - 1.0 (mg/dL)    Nitrite NEGATIVE  NEGATIVE     Leukocytes, UA NEGATIVE  NEGATIVE  MICROSCOPIC NOT DONE ON URINES WITH NEGATIVE PROTEIN,  BLOOD, LEUKOCYTES, NITRITE, OR GLUCOSE <1000 mg/dL.  URINE RAPID DRUG SCREEN (HOSP PERFORMED)     Status: Abnormal   Collection Time   10/17/11  2:36 PM      Component Value Range Comment   Opiates POSITIVE (*) NONE DETECTED     Cocaine NONE DETECTED  NONE DETECTED     Benzodiazepines NONE DETECTED  NONE DETECTED     Amphetamines NONE DETECTED  NONE DETECTED     Tetrahydrocannabinol NONE DETECTED  NONE DETECTED     Barbiturates NONE DETECTED  NONE DETECTED      Ct Head Wo Contrast  10/17/2011  *RADIOLOGY REPORT*  Clinical Data: Fall.  CT HEAD WITHOUT CONTRAST  Technique:  Contiguous axial images were obtained from the base of the skull through the vertex without contrast.  Comparison: None.  Findings: No mass effect, midline shift, or acute intracranial hemorrhage.  Mild global atrophy and upper portion to age is present.  Mucosal thickening in the left maxillary and ethmoid sinuses.  Mastoid air cells are clear.  Cranium is intact.  IMPRESSION: No acute intracranial pathology.  Original Report Authenticated By: Donavan Burnet, M.D.    Impression:  Active Problems:  TOBACCO ABUSE  Encephalopathy  Tachycardia  Dehydration  Hypernatremia  Hepatitis C  Elevated LFTs  Alcohol abuse  Alcohol intoxication  Muscular disease  Chronic pain syndrome    1. Encephalopathy of unclear etiology. The patient was reportedly intoxicated from alcohol and subsequently agitated. His urine drug screen is positive for opiates. He reportedly takes baclofen and abuses it per history provided by his wife to the registered nurse in the emergency department. He was also recently given a prescription for hydrocodone or oxycodone by some physician for his pain. His encephalopathy and agitation are likely secondary to accommodation of alcohol abuse and concomitant drug toxicity. He is sedated, status post a total of 4 mg of Ativan.  Muscular disease of unknown etiology. The patient is apparently being followed  by physicians at [facility]. He underwent a biopsy of his left deltoid area last week. He had been diagnosed with polymyositis in January 2011. Reportedly, he has had multiple biopsies but I am unable to find out what the results revealed. He reportedly had muscle twitching earlier in the ED but there was no evidence of muscle twitching per my exam.  Hypernatremia and tachycardia likely secondary to dehydration.  Chronic Hepatitis C with elevated LFTs.  Alcohol abuse/alcohol intoxication. He has been hospitalized at behavioral health on a number of occasions because of both alcohol abuse, substance abuse, and depression.  Tobacco abuse.    Plan:  1. The patient will be admitted to the step down unit for closer monitoring. 2. Will start  IV fluid hydration. We'll start vitamin therapy intravenously. We'll start the Ativan alcohol withdrawal protocol. Will add when necessary Haldol for agitation. 3. Will start Protonix empirically.  4. Neurological checks every 4 hours for the next 36 hours. 5. We'll order an EEG. 6. Neurology consultation for further recommendations and management. 7. For further evaluation, we will order a total CK, EEG, TSH, and free T4.      Tawnya Pujol 10/17/2011, 6:47 PM

## 2011-10-17 NOTE — ED Notes (Signed)
Pt became combative while attempting CT of head, would not stay on stretcher.  EDP notified and orders received to give pt Ativan 1mg  IV now.  Med given with no relief, pt was still combative, thrashing on stretcher with multiple staff members holding pt still for safety precautions.  edp notified and orders received to give another dose of ativan 1mg  IV (3rd dose) and can give up to 4 mg Ativan IV total (one more dose) if needed for sedation.   Ativan 3mg  IV total given over approx 20 mins.  Pt relaxed, calm to complete Head CT - vital remained stable during diagnostic.  Pt no longer twitching at this time, slightly diaphoretic, cool cloths given for comfort, vitals remain stable, ST on monitor, airway remains intact on O2 2l/min with sats at 98%.  Family at bedside.

## 2011-10-17 NOTE — ED Notes (Signed)
Pt's wife - Saleem Coccia - left contact info for any changes in pt status.  Phone number [phone].

## 2011-10-17 NOTE — ED Notes (Signed)
Pt became combative, attempting to climb out of bed, unable to console pt.  edp notified, orders received.  Pt appears more calm at this time, slight muscle twitching noted which per wife is not normal for pt.  Wife reports pt has had muscle biopsy to left shoulder x 3, last biopsy performed last week.  Wife unsure of definitive dx of muscular d/o.

## 2011-10-17 NOTE — ED Notes (Signed)
Notified Dr. [name] of CIWA scale score, ordered to give Ativan 1 mg IV now.

## 2011-10-17 NOTE — ED Provider Notes (Signed)
History     CSN: [id number]  Arrival date & time 10/17/11  1312   First MD Initiated Contact with Patient 10/17/11 1342      Chief Complaint  Patient presents with  . Rectal Bleeding    (Consider location/radiation/quality/duration/timing/severity/associated sxs/prior treatment) Patient is a 46 y.o. male presenting with hematochezia. The history is provided by the EMS personnel. The history is limited by the condition of the patient (Intoxicated).  Rectal Bleeding   Patient is overtly intoxicated and will not answer questions or follow commands. History is obtained completely from EMS report from family who apparently stated that he drank a fifth of liquor over the last 12-15 hours and they noted that he had bloody stool running down his legs. No other history is available at this point.  Past Medical History  Diagnosis Date  . Hepatitis C   . Muscle disease     biopsy on shoulder last week  . Cirrhosis   . Back pain     Past Surgical History  Procedure Date  . Hernia repair   . Muscle biopsy     No family history on file.  History  Substance Use Topics  . Smoking status: Current Everyday Smoker  . Smokeless tobacco: Not on file  . Alcohol Use: Yes      Review of Systems  Unable to perform ROS: Mental status change  Gastrointestinal: Positive for hematochezia.    Allergies  Propoxyphene n-acetaminophen  Home Medications  No current outpatient prescriptions on file.  BP 132/71  Pulse 103  Temp(Src) 98.4 F (36.9 C) (Rectal)  Resp 20  SpO2 97%  Physical Exam  Nursing note and vitals reviewed.  46 year old male who responds to painful stimuli and will look at you in response to verbal stimuli and is in no acute distress. Vital signs are significant for mild tachycardia with heart rate 103. Oxygen saturation is 97% which is normal. Head is hemispheric and atraumatic. PERRLA LA. He would not cooperate for EOM exam but no gross deficits noted in EOMs.  Oropharynx is clear. Neck is supple and nontender without adenopathy. Back is nontender. Lungs have scattered wheezes and prolonged exhalation phase. Heart has regular rate and rhythm without murmur. There is no chest wall tenderness. Abdomen is soft with mild, diffuse tenderness. Peristalsis is diminished. Rectal exam shows a tan colored liquid stool which is Hemoccult negative. Extremities have no cyanosis or edema, full range of motion is present. Skin is warm and moist without rash. Neurologic: He is awake and alert and responds to painful stimuli and to voice but is nonverbal does not follow commands. Cranial nerves are grossly intact and there no gross motor or sensory deficits.  ED Course  Procedures (including critical care time)  Results for orders placed during the hospital encounter of 10/17/11  CBC      Component Value Range   WBC 9.8  4.0 - 10.5 (K/uL)   RBC 5.19  4.22 - 5.81 (MIL/uL)   Hemoglobin 16.3  13.0 - 17.0 (g/dL)   HCT 78.2  95.6 - 21.3 (%)   MCV 88.4  78.0 - 100.0 (fL)   MCH 31.4  26.0 - 34.0 (pg)   MCHC 35.5  30.0 - 36.0 (g/dL)   RDW 08.6  57.8 - 46.9 (%)   Platelets 252  150 - 400 (K/uL)  DIFFERENTIAL      Component Value Range   Neutrophils Relative 51  43 - 77 (%)   Lymphocytes Relative 35  12 -  46 (%)   Monocytes Relative 11  3 - 12 (%)   Eosinophils Relative 2  0 - 5 (%)   Basophils Relative 1  0 - 1 (%)   Neutro Abs 5.0  1.7 - 7.7 (K/uL)   Lymphs Abs 3.4  0.7 - 4.0 (K/uL)   Monocytes Absolute 1.1 (*) 0.1 - 1.0 (K/uL)   Eosinophils Absolute 0.2  0.0 - 0.7 (K/uL)   Basophils Absolute 0.1  0.0 - 0.1 (K/uL)   WBC Morphology WHITE COUNT CONFIRMED ON SMEAR     Smear Review LARGE PLATELETS PRESENT    COMPREHENSIVE METABOLIC PANEL      Component Value Range   Sodium 148 (*) 135 - 145 (mEq/L)   Potassium 5.0  3.5 - 5.1 (mEq/L)   Chloride 106  96 - 112 (mEq/L)   CO2 29  19 - 32 (mEq/L)   Glucose, Bld 119 (*) 70 - 99 (mg/dL)   BUN 4 (*) 6 - 23 (mg/dL)    Creatinine, Ser 1.61 (*) 0.50 - 1.35 (mg/dL)   Calcium 9.9  8.4 - 09.6 (mg/dL)   Total Protein 7.5  6.0 - 8.3 (g/dL)   Albumin 3.6  3.5 - 5.2 (g/dL)   AST 045 (*) 0 - 37 (U/L)   ALT 99 (*) 0 - 53 (U/L)   Alkaline Phosphatase 61  39 - 117 (U/L)   Total Bilirubin 0.3  0.3 - 1.2 (mg/dL)   GFR calc non Af Amer >90  >90 (mL/min)   GFR calc Af Amer >90  >90 (mL/min)  URINALYSIS, ROUTINE W REFLEX MICROSCOPIC      Component Value Range   Color, Urine YELLOW  YELLOW    APPearance CLEAR  CLEAR    Specific Gravity, Urine 1.010  1.005 - 1.030    pH 7.0  5.0 - 8.0    Glucose, UA NEGATIVE  NEGATIVE (mg/dL)   Hgb urine dipstick NEGATIVE  NEGATIVE    Bilirubin Urine NEGATIVE  NEGATIVE    Ketones, ur NEGATIVE  NEGATIVE (mg/dL)   Protein, ur NEGATIVE  NEGATIVE (mg/dL)   Urobilinogen, UA 0.2  0.0 - 1.0 (mg/dL)   Nitrite NEGATIVE  NEGATIVE    Leukocytes, UA NEGATIVE  NEGATIVE   ETHANOL      Component Value Range   Alcohol, Ethyl (B) 117 (*) 0 - 11 (mg/dL)  TYPE AND SCREEN      Component Value Range   ABO/RH(D) A POS     Antibody Screen NEG     Sample Expiration 10/20/2011    URINE RAPID DRUG SCREEN (HOSP PERFORMED)      Component Value Range   Opiates POSITIVE (*) NONE DETECTED    Cocaine NONE DETECTED  NONE DETECTED    Benzodiazepines NONE DETECTED  NONE DETECTED    Amphetamines NONE DETECTED  NONE DETECTED    Tetrahydrocannabinol NONE DETECTED  NONE DETECTED    Barbiturates NONE DETECTED  NONE DETECTED   ACETAMINOPHEN LEVEL      Component Value Range   Acetaminophen (Tylenol), Serum <15.0  10 - 30 (ug/mL)  SALICYLATE LEVEL      Component Value Range   Salicylate Lvl <2.0 (*) 2.8 - 20.0 (mg/dL)   Ct Head Wo Contrast  10/17/2011  *RADIOLOGY REPORT*  Clinical Data: Fall.  CT HEAD WITHOUT CONTRAST  Technique:  Contiguous axial images were obtained from the base of the skull through the vertex without contrast.  Comparison: None.  Findings: No mass effect, midline shift, or acute  intracranial  hemorrhage.  Mild global atrophy and upper portion to age is present.  Mucosal thickening in the left maxillary and ethmoid sinuses.  Mastoid air cells are clear.  Cranium is intact.  IMPRESSION: No acute intracranial pathology.  Original Report Authenticated By: Donavan Burnet, M.D.     Date: 10/17/2011  Rate: 113  Rhythm: sinus tachycardia  QRS Axis: normal  Intervals: normal  ST/T Wave abnormalities: normal  Conduction Disutrbances:none  Narrative Interpretation: Anterior Q waves consistent with old anteroseptal myocardial infarction. When compared with ECG of 02/24/2005, no significant changes are seen.  Old EKG Reviewed: unchanged   1535: Patient's wife has arrived and apparently he had a mental status change after falling and hitting his head. It is not certain whether he hit his head on the refrigerator or the floor. CT scan is ordered.  1630: He required sedation with Ativan in order to hold still enough to get CT scan. CT scan shows no evidence of intracranial injury. He will be given a dose of naloxone to evaluate response in light of relatively low ethanol level, negative CT scan, and positive opiates on urine drug screen.  1700: He was given naloxone with no change in mental state. However, he had received 4 mg of Ativan to sedate him for his CT scan. His wife states that he has tramadol and hydrocodone at home as well as a variety of muscle relaxers including baclofen. He has been treated for an unknown muscle problem and he needs a walker to get around. He had a recent muscle biopsy which is why he was given the hydrocodone. He has been complaining of increased back pain, and has been known to take several baclofen at a time. At this point, I feel it is most likely reason for her mental status change is an overdose or side effect of baclofen. You will need to be admitted because of his persistent altered mental status. Consultation will be obtained with [facility].  1725: Case is discussed with Dr. [name] who agrees to admit the patient. Side effect profile of baclofen was reviewed, and his symptoms are all those that are reported with baclofen.  1. Altered mental status   2. Alcohol intoxication   3. Elevated liver enzymes     CRITICAL CARE Performed by: [email or web address]   Total critical care time: 150 minutes  Critical care time was exclusive of separately billable procedures and treating other patients.  Critical care was necessary to treat or prevent imminent or life-threatening deterioration.  Critical care was time spent personally by me on the following activities: development of treatment plan with patient and/or surrogate as well as nursing, discussions with consultants, evaluation of patient's response to treatment, examination of patient, obtaining history from patient or surrogate, ordering and performing treatments and interventions, ordering and review of laboratory studies, ordering and review of radiographic studies, pulse oximetry and re-evaluation of patient's condition.   MDM  Ethanol intoxication without evidence of GI bleed. Abdominal pain probably represents some degree of alcoholic gastritis although pancreatitis is also possible.        Dione Booze, MD 10/17/11 [id number]

## 2011-10-17 NOTE — ED Notes (Signed)
Attempted second IV site without success.

## 2011-10-18 ENCOUNTER — Inpatient Hospital Stay (HOSPITAL_COMMUNITY)
Admit: 2011-10-18 | Discharge: 2011-10-18 | Disposition: A | Discharge status: Home / Self Care | Payer: Medicaid Other | Attending: Internal Medicine | Admitting: Internal Medicine

## 2011-10-18 LAB — COMPREHENSIVE METABOLIC PANEL
AST: 78 U/L — ABNORMAL HIGH (ref 0–37)
Albumin: 3 g/dL — ABNORMAL LOW (ref 3.5–5.2)
BUN: 4 mg/dL — ABNORMAL LOW (ref 6–23)
Calcium: 8.4 mg/dL (ref 8.4–10.5)
Creatinine, Ser: 0.43 mg/dL — ABNORMAL LOW (ref 0.50–1.35)
Total Bilirubin: 0.5 mg/dL (ref 0.3–1.2)
Total Protein: 6.1 g/dL (ref 6.0–8.3)

## 2011-10-18 LAB — CARDIAC PANEL(CRET KIN+CKTOT+MB+TROPI)
CK, MB: 16.5 ng/mL (ref 0.3–4.0)
Relative Index: 4 — ABNORMAL HIGH (ref 0.0–2.5)
Total CK: 415 U/L — ABNORMAL HIGH (ref 7–232)

## 2011-10-18 LAB — T4, FREE: Free T4: 1.06 ng/dL (ref 0.80–1.80)

## 2011-10-18 LAB — TSH: TSH: 0.242 u[IU]/mL — ABNORMAL LOW (ref 0.350–4.500)

## 2011-10-18 LAB — CBC
Hemoglobin: 14.6 g/dL (ref 13.0–17.0)
MCH: 31 pg (ref 26.0–34.0)
MCV: 89.4 fL (ref 78.0–100.0)
Platelets: 212 10*3/uL (ref 150–400)
RBC: 4.71 MIL/uL (ref 4.22–5.81)
WBC: 10 10*3/uL (ref 4.0–10.5)

## 2011-10-18 LAB — APTT: aPTT: 32 seconds (ref 24–37)

## 2011-10-18 MED ORDER — INFLUENZA VIRUS VACC SPLIT PF IM SUSP
0.5000 mL | INTRAMUSCULAR | Status: DC
  Filled 2011-10-18: qty 0.5

## 2011-10-18 MED ORDER — POTASSIUM CHLORIDE IN NACL 20-0.45 MEQ/L-% IV SOLN
INTRAVENOUS | Status: DC
  Administered 2011-10-18: 75 mL via INTRAVENOUS
  Administered 2011-10-19: 03:00:00 via INTRAVENOUS
  Filled 2011-10-18 (×4): qty 1000

## 2011-10-18 MED ORDER — SODIUM CHLORIDE 0.9 % IJ SOLN
INTRAMUSCULAR | Status: AC
  Administered 2011-10-18: 10 mL
  Filled 2011-10-18: qty 3

## 2011-10-18 MED ORDER — PNEUMOCOCCAL VAC POLYVALENT 25 MCG/0.5ML IJ INJ
0.5000 mL | INJECTION | INTRAMUSCULAR | Status: DC
Start: 2011-10-19 — End: 2011-10-19
  Filled 2011-10-18: qty 0.5

## 2011-10-18 NOTE — Progress Notes (Signed)
Subjective: The patient had mild agitation overnight, treated successfully with Ativan. He is semi-alert and answers to his name. He is not forthcoming with any information about what transpired yesterday. He has no complaints of pain or shortness of breath currently.  Objective: Vital signs in last 24 hours: Filed Vitals:   10/18/11 0300 10/18/11 0400 10/18/11 0500 10/18/11 0600  BP: 134/85 130/84 144/93 138/87  Pulse: 102 84 103 108  Temp:  98.7 F (37.1 C)    TempSrc:  Axillary    Resp: 21 16 18 18   Height:      Weight:   65.9 kg (145 lb 4.5 oz)   SpO2: 99% 100% 99% 100%    Intake/Output Summary (Last 24 hours) at 10/18/11 0738 Last data filed at 10/18/11 0600  Gross per 24 hour  Intake 1150.42 ml  Output    750 ml  Net 400.42 ml    Weight change:   Physical exam: General: Disheveled-appearing 46 year old Caucasian man lying in bed, moving all of his extremities. Lungs: Clear to auscultation bilaterally. Heart: S1, S2, with borderline tachycardia. Abdomen: Positive bowel sounds, soft, nontender, nondistended. Extremities: No pedal edema. Skin: Multiple tattoos on both arms. Neurologic/psychological: He is semi-alert, bordering on lethargy. He says that he is in the hospital but he does not know which one. He knows that he is in [place]. He is not forthcoming about his recent history. He is confused. His cranial nerves are grossly intact. No resting or intention tremor noted. No muscle twitching noted.  Lab Results: Basic Metabolic Panel:  Basename 10/18/11 0426 10/17/11 2141 10/17/11 1803  NA 143 143 --  K 3.8 4.1 --  CL 110 109 --  CO2 26 25 --  GLUCOSE 134* 141* --  BUN 4* 4* --  CREATININE 0.43* 0.50 --  CALCIUM 8.4 8.8 --  MG -- -- 1.9  PHOS -- -- 3.6   Liver Function Tests:  Basename 10/18/11 0426 10/17/11 1410  AST 78* 123*  ALT 72* 99*  ALKPHOS 50 61  BILITOT 0.5 0.3  PROT 6.1 7.5  ALBUMIN 3.0* 3.6   No results found for this basename:  LIPASE:2,AMYLASE:2 in the last 72 hours No results found for this basename: AMMONIA:2 in the last 72 hours CBC:  Basename 10/18/11 0426 10/17/11 1410  WBC 10.0 9.8  NEUTROABS -- 5.0  HGB 14.6 16.3  HCT 42.1 45.9  MCV 89.4 88.4  PLT 212 252   Cardiac Enzymes:  Basename 10/18/11 0430 10/17/11 2143 10/17/11 1803  CKTOTAL 415* 403* 388*  CKMB 16.5* 18.0* 19.0*  CKMBINDEX -- -- --  TROPONINI <0.30 <0.30 --   BNP: No results found for this basename: PROBNP:3 in the last 72 hours D-Dimer: No results found for this basename: DDIMER:2 in the last 72 hours CBG: No results found for this basename: GLUCAP:6 in the last 72 hours Hemoglobin A1C: No results found for this basename: HGBA1C in the last 72 hours Fasting Lipid Panel: No results found for this basename: CHOL,HDL,LDLCALC,TRIG,CHOLHDL,LDLDIRECT in the last 72 hours Thyroid Function Tests: No results found for this basename: TSH,T4TOTAL,FREET4,T3FREE,THYROIDAB in the last 72 hours Anemia Panel: No results found for this basename: VITAMINB12,FOLATE,FERRITIN,TIBC,IRON,RETICCTPCT in the last 72 hours Coagulation:  Basename 10/18/11 0426  LABPROT 15.6*  INR 1.21   Urine Drug Screen: Drugs of Abuse     Component Value Date/Time   LABOPIA POSITIVE* 10/17/2011 1436   COCAINSCRNUR NONE DETECTED 10/17/2011 1436   LABBENZ NONE DETECTED 10/17/2011 1436   AMPHETMU NONE DETECTED 10/17/2011  1436   THCU NONE DETECTED 10/17/2011 1436   LABBARB NONE DETECTED 10/17/2011 1436    Alcohol Level:  Basename 10/17/11 1410  ETH 117*   Urinalysis:  Misc. Labs:   Micro: Recent Results (from the past 240 hour(s))  MRSA PCR SCREENING     Status: Normal   Collection Time   10/17/11  7:47 PM      Component Value Range Status Comment   MRSA by PCR NEGATIVE  NEGATIVE  Final     Studies/Results: Ct Head Wo Contrast  10/17/2011  *RADIOLOGY REPORT*  Clinical Data: Fall.  CT HEAD WITHOUT CONTRAST  Technique:  Contiguous axial images were  obtained from the base of the skull through the vertex without contrast.  Comparison: None.  Findings: No mass effect, midline shift, or acute intracranial hemorrhage.  Mild global atrophy and upper portion to age is present.  Mucosal thickening in the left maxillary and ethmoid sinuses.  Mastoid air cells are clear.  Cranium is intact.  IMPRESSION: No acute intracranial pathology.  Original Report Authenticated By: Donavan Burnet, M.D.    Medications: I have reviewed the patient's current medications.  Assessment: Active Problems:  TOBACCO ABUSE  Encephalopathy  Tachycardia  Dehydration  Hypernatremia  Hepatitis C  Elevated LFTs  Alcohol abuse  Alcohol intoxication  Muscular disease  Chronic pain syndrome   1. Encephalopathy. Likely due to alcohol intoxication and concomitant drug toxicity. It is likely that the patient took a combination of opiates and baclofen with alcohol. He is semi-alert and not agitated currently.  Alcohol and tobacco abuse. He is on the Ativan withdrawal protocol and vitamin therapy.  Dehydration/hypernatremia. Resolving with IV fluids.  Hepatitis C with elevated LFTs. His LFTs are trending downward with hydration.  Muscular disease of unknown etiology. Hopefully the patient will be able to tell me more when he is not encephalopathic. His total CK and CK-MB are moderately elevated but not in the rhabdomyolysis range.  Chronic pain syndrome. Reportedly, he is treated with tramadol, baclofen, and an opiate.  Hyperglycemia secondary to dextrose and IV fluids.  Plan:  1. Continue supportive treatment and IV fluid hydration. Continue Ativan protocol. Continue when necessary Haldol. Will start clear liquids when he is more alert and advance as tolerated. Neurology consultation pending. EEG pending. Thyroid function pending. We'll try to get more information from his wife and/or his mother.    LOS: 1 day   Izzy Courville 10/18/2011, 7:38 AM

## 2011-10-18 NOTE — Progress Notes (Signed)
Patient complained about foley catheter burning. Paged MD to see if we could remove it. Order placed to remove foley per MD order. Catheter removed at 2110 by RN.

## 2011-10-18 NOTE — Progress Notes (Signed)
CRITICAL VALUE ALERT  Critical value received:MB 16.5  Date of notification:  10-18-11  Time of notification:  0720  Critical value read back:yes  Nurse who received alert:  Sophronia Simas RN  MD notified (1st page):  Fisher   Time of first page:  On unit  MD notified (2nd page):  Time of second page:  Responding MD:  [name]  Time MD responded:  [phone]

## 2011-10-18 NOTE — Progress Notes (Signed)
UR Chart Review Completed  

## 2011-10-19 ENCOUNTER — Encounter (HOSPITAL_COMMUNITY): Payer: Self-pay | Admitting: Internal Medicine

## 2011-10-19 LAB — CBC
HCT: 42.8 % (ref 39.0–52.0)
MCH: 30.8 pg (ref 26.0–34.0)
MCHC: 34.8 g/dL (ref 30.0–36.0)
MCV: 88.6 fL (ref 78.0–100.0)
RDW: 13.4 % (ref 11.5–15.5)

## 2011-10-19 LAB — COMPREHENSIVE METABOLIC PANEL
ALT: 60 U/L — ABNORMAL HIGH (ref 0–53)
Albumin: 3.1 g/dL — ABNORMAL LOW (ref 3.5–5.2)
Alkaline Phosphatase: 50 U/L (ref 39–117)
Chloride: 103 mEq/L (ref 96–112)
Potassium: 3.5 mEq/L (ref 3.5–5.1)
Sodium: 138 mEq/L (ref 135–145)
Total Bilirubin: 0.9 mg/dL (ref 0.3–1.2)
Total Protein: 6.2 g/dL (ref 6.0–8.3)

## 2011-10-19 LAB — URINE CULTURE: Culture: NO GROWTH

## 2011-10-19 MED ORDER — SODIUM CHLORIDE 0.9 % IJ SOLN
INTRAMUSCULAR | Status: AC
  Filled 2011-10-19: qty 3

## 2011-10-19 MED ORDER — VITAMIN B-1 100 MG PO TABS
100.0000 mg | ORAL_TABLET | Freq: Every day | ORAL | Status: DC
Start: 2011-10-19 — End: 2011-10-19
  Administered 2011-10-19: 100 mg via ORAL
  Filled 2011-10-19: qty 1

## 2011-10-19 MED ORDER — ADULT MULTIVITAMIN W/MINERALS CH: 1.0000 | ORAL_TABLET | Freq: Every day | ORAL | Status: DC

## 2011-10-19 MED ORDER — PANTOPRAZOLE SODIUM 40 MG PO TBEC
40.0000 mg | DELAYED_RELEASE_TABLET | Freq: Every day | ORAL | Status: DC
Start: 2011-10-19 — End: 2011-10-19
  Administered 2011-10-19: 40 mg via ORAL
  Filled 2011-10-19: qty 1

## 2011-10-19 NOTE — Progress Notes (Signed)
Discharge instruction gone over with patient. Family members at bedside. All questions and concerns answered. Pt discharge home with family member.

## 2011-10-19 NOTE — Procedures (Signed)
NAMEDARONTE, SHOSTAK                   ACCOUNT NO.:  [id number]  MEDICAL RECORD NO.:  [id number]  LOCATION:  IC02                          FACILITY:  [facility]  PHYSICIAN:  Kjersti Dittmer A. Gerilyn Pilgrim, M.D. DATE OF BIRTH:  May 26, 1966  DATE OF PROCEDURE: DATE OF DISCHARGE:                             EEG INTERPRETATION   This is a gentleman who presents with confusion and altered mental status.  The study is being done to evaluate for nonconvulsive seizures.  MEDICATIONS:  Haldol, Ativan, Protonix, Tylenol, and Proventil.  ANALYSIS:  A 16-channel recording is conducted for 22 minutes.  There is a well-formed posterior dominant rhythm, I guess, as high as 13 Hz bilaterally.  There is higher beta activity observed in the frontal areas.  Awake and some sleep activities are recorded.  Stage 2 sleep with sleep spindles are seen.  There is no focal or lateralized slowing. There is no epileptiform activity.  There is occasional frontal intermittent rhythmic delta activity observed.  There is no epileptiform activity seen, however.  IMPRESSION:  Mildly abnormal recording showing infrequent frontal intermittent rhythmic delta activity, which is typically seen in nonspecific metabolic encephalopathies, otherwise the recording is essentially unremarkable.  There is no epileptiform discharges observed.     Camrin Lapre A. Gerilyn Pilgrim, M.D.     KAD/MEDQ  D:  10/19/2011  T:  10/19/2011  Job:  [id number]

## 2011-10-19 NOTE — Progress Notes (Signed)
Pt d/c home today. Per pt no d/c needs.

## 2011-10-19 NOTE — Progress Notes (Signed)
CSW met with pt regarding substance abuse.  Full note and SBIRT on shadow chart.  Pt admits to extensive ETOH history of 30 years.  He has been to inpatient/outpatient therapy in the past and reports these were not helpful.  Pt states he will stop when he sets his mind to it and is not interested in any substance abuse follow up.  CSW to sign off.  Karn Cassis

## 2011-10-19 NOTE — Discharge Summary (Signed)
Physician Discharge Summary  JAKOBEE BRACKINS MRN: [id number] DOB/AGE: 1966-04-28 46 y.o.  PCP: [facility]   Admit date: 10/17/2011 Discharge date: 10/19/2011  Discharge Diagnoses:  1. Acute encephalopathy secondary to alcohol intoxication concomitant with multidrug intoxication (alcohol, oxycodone, baclofen, and Lyrica.). No evidence of suicidal behavior. 2. Recent hospitalization at [facility] for severe muscle pain/weakness secondary to a nonspecific myopathy. 3. Myositis of unknown etiology. Status post left deltoid biopsy at [facility] last week. No results were available. Information from [facility] revealed that he had a negative ANA, negative ENA, negative anti-DS DNA, negative CCP, and negative sed rate. His CRP was elevated at 2.2 and his rheumatoid factor was slightly elevated at 17.7. Other laboratory results were pending according to the discharge summary. 4. Chronic alcoholism/alcohol abuse. History of treatment inpatient and outpatient multiple times. 5. Mild alcohol withdrawal syndrome. 6. Tobacco abuse. 7. Chronic hepatitis C infection with elevated liver transaminases during the hospitalization. 8. Reported tremor and shaking, prior to admission. EEG negative for epileptiform activity. 9. Hypernatremia secondary to dehydration.   Medication List  As of 10/19/2011  3:03 PM   TAKE these medications         baclofen 10 MG tablet   Commonly known as: LIORESAL   Take 10 mg by mouth 3 (three) times daily.      mulitivitamin with minerals Tabs   Take 1 tablet by mouth daily.      omeprazole 20 MG capsule   Commonly known as: PRILOSEC   Take 20 mg by mouth 2 (two) times daily.      oxyCODONE 5 MG immediate release tablet   Commonly known as: Oxy IR/ROXICODONE   Take 5 mg by mouth every 4 (four) hours as needed. Pain      pregabalin 150 MG capsule   Commonly known as: LYRICA   Take 150 mg by mouth 3 (three) times daily.             Discharge Condition: Stable and improved.  Disposition: Home.    Consults: None.   Significant Diagnostic Studies: Ct Head Wo Contrast  10/17/2011  *RADIOLOGY REPORT*  Clinical Data: Fall.  CT HEAD WITHOUT CONTRAST  Technique:  Contiguous axial images were obtained from the base of the skull through the vertex without contrast.  Comparison: None.  Findings: No mass effect, midline shift, or acute intracranial hemorrhage.  Mild global atrophy and upper portion to age is present.  Mucosal thickening in the left maxillary and ethmoid sinuses.  Mastoid air cells are clear.  Cranium is intact.  IMPRESSION: No acute intracranial pathology.  Original Report Authenticated By: Donavan Burnet, M.D.     Microbiology: Recent Results (from the past 240 hour(s))  URINE CULTURE     Status: Normal   Collection Time   10/17/11  2:36 PM      Component Value Range Status Comment   Specimen Description URINE, CATHETERIZED   Final    Special Requests NONE   Final    Culture  Setup Time [id number]   Final    Colony Count NO GROWTH   Final    Culture NO GROWTH   Final    Report Status 10/19/2011 FINAL   Final   MRSA PCR SCREENING     Status: Normal   Collection Time   10/17/11  7:47 PM      Component Value Range Status Comment   MRSA by PCR NEGATIVE  NEGATIVE  Final  Labs: Results for orders placed during the hospital encounter of 10/17/11 (from the past 48 hour(s))  CK TOTAL AND CKMB     Status: Abnormal   Collection Time   10/17/11  6:03 PM      Component Value Range Comment   Total CK 388 (*) 7 - 232 (U/L)    CK, MB 19.0 (*) 0.3 - 4.0 (ng/mL)    Relative Index 4.9 (*) 0.0 - 2.5    MAGNESIUM     Status: Normal   Collection Time   10/17/11  6:03 PM      Component Value Range Comment   Magnesium 1.9  1.5 - 2.5 (mg/dL)   PHOSPHORUS     Status: Normal   Collection Time   10/17/11  6:03 PM      Component Value Range Comment   Phosphorus 3.6  2.3 - 4.6 (mg/dL)   MRSA PCR SCREENING      Status: Normal   Collection Time   10/17/11  7:47 PM      Component Value Range Comment   MRSA by PCR NEGATIVE  NEGATIVE    BASIC METABOLIC PANEL     Status: Abnormal   Collection Time   10/17/11  9:41 PM      Component Value Range Comment   Sodium 143  135 - 145 (mEq/L)    Potassium 4.1  3.5 - 5.1 (mEq/L) DELTA CHECK NOTED   Chloride 109  96 - 112 (mEq/L)    CO2 25  19 - 32 (mEq/L)    Glucose, Bld 141 (*) 70 - 99 (mg/dL)    BUN 4 (*) 6 - 23 (mg/dL)    Creatinine, Ser 8.29  0.50 - 1.35 (mg/dL)    Calcium 8.8  8.4 - 10.5 (mg/dL)    GFR calc non Af Amer >90  >90 (mL/min)    GFR calc Af Amer >90  >90 (mL/min)   CARDIAC PANEL(CRET KIN+CKTOT+MB+TROPI)     Status: Abnormal   Collection Time   10/17/11  9:43 PM      Component Value Range Comment   Total CK 403 (*) 7 - 232 (U/L)    CK, MB 18.0 (*) 0.3 - 4.0 (ng/mL) CRITICAL VALUE NOTED.  VALUE IS CONSISTENT WITH PREVIOUSLY REPORTED AND CALLED VALUE.   Troponin I <0.30  <0.30 (ng/mL)    Relative Index 4.5 (*) 0.0 - 2.5    COMPREHENSIVE METABOLIC PANEL     Status: Abnormal   Collection Time   10/18/11  4:26 AM      Component Value Range Comment   Sodium 143  135 - 145 (mEq/L)    Potassium 3.8  3.5 - 5.1 (mEq/L)    Chloride 110  96 - 112 (mEq/L)    CO2 26  19 - 32 (mEq/L)    Glucose, Bld 134 (*) 70 - 99 (mg/dL)    BUN 4 (*) 6 - 23 (mg/dL)    Creatinine, Ser 5.62 (*) 0.50 - 1.35 (mg/dL)    Calcium 8.4  8.4 - 10.5 (mg/dL)    Total Protein 6.1  6.0 - 8.3 (g/dL)    Albumin 3.0 (*) 3.5 - 5.2 (g/dL)    AST 78 (*) 0 - 37 (U/L)    ALT 72 (*) 0 - 53 (U/L)    Alkaline Phosphatase 50  39 - 117 (U/L)    Total Bilirubin 0.5  0.3 - 1.2 (mg/dL)    GFR calc non Af Amer >90  >90 (mL/min)  GFR calc Af Amer >90  >90 (mL/min)   CBC     Status: Normal   Collection Time   10/18/11  4:26 AM      Component Value Range Comment   WBC 10.0  4.0 - 10.5 (K/uL)    RBC 4.71  4.22 - 5.81 (MIL/uL)    Hemoglobin 14.6  13.0 - 17.0 (g/dL)    HCT 40.9  81.1 -  91.4 (%)    MCV 89.4  78.0 - 100.0 (fL)    MCH 31.0  26.0 - 34.0 (pg)    MCHC 34.7  30.0 - 36.0 (g/dL)    RDW 78.2  95.6 - 21.3 (%)    Platelets 212  150 - 400 (K/uL)   PROTIME-INR     Status: Abnormal   Collection Time   10/18/11  4:26 AM      Component Value Range Comment   Prothrombin Time 15.6 (*) 11.6 - 15.2 (seconds)    INR 1.21  0.00 - 1.49    APTT     Status: Normal   Collection Time   10/18/11  4:26 AM      Component Value Range Comment   aPTT 32  24 - 37 (seconds)   TSH     Status: Abnormal   Collection Time   10/18/11  4:26 AM      Component Value Range Comment   TSH 0.242 (*) 0.350 - 4.500 (uIU/mL)   T4, FREE     Status: Normal   Collection Time   10/18/11  4:26 AM      Component Value Range Comment   Free T4 1.06  0.80 - 1.80 (ng/dL)   CARDIAC PANEL(CRET KIN+CKTOT+MB+TROPI)     Status: Abnormal   Collection Time   10/18/11  4:30 AM      Component Value Range Comment   Total CK 415 (*) 7 - 232 (U/L)    CK, MB 16.5 (*) 0.3 - 4.0 (ng/mL)    Troponin I <0.30  <0.30 (ng/mL)    Relative Index 4.0 (*) 0.0 - 2.5    COMPREHENSIVE METABOLIC PANEL     Status: Abnormal   Collection Time   10/19/11  4:53 AM      Component Value Range Comment   Sodium 138  135 - 145 (mEq/L)    Potassium 3.5  3.5 - 5.1 (mEq/L)    Chloride 103  96 - 112 (mEq/L)    CO2 26  19 - 32 (mEq/L)    Glucose, Bld 119 (*) 70 - 99 (mg/dL)    BUN <3 (*) 6 - 23 (mg/dL)    Creatinine, Ser 0.86 (*) 0.50 - 1.35 (mg/dL)    Calcium 9.5  8.4 - 10.5 (mg/dL)    Total Protein 6.2  6.0 - 8.3 (g/dL)    Albumin 3.1 (*) 3.5 - 5.2 (g/dL)    AST 69 (*) 0 - 37 (U/L)    ALT 60 (*) 0 - 53 (U/L)    Alkaline Phosphatase 50  39 - 117 (U/L)    Total Bilirubin 0.9  0.3 - 1.2 (mg/dL)    GFR calc non Af Amer >90  >90 (mL/min)    GFR calc Af Amer >90  >90 (mL/min)   CBC     Status: Normal   Collection Time   10/19/11  4:53 AM      Component Value Range Comment   WBC 9.9  4.0 - 10.5 (K/uL)    RBC 4.83  4.22 -  5.81 (MIL/uL)     Hemoglobin 14.9  13.0 - 17.0 (g/dL)    HCT 56.2  13.0 - 86.5 (%)    MCV 88.6  78.0 - 100.0 (fL)    MCH 30.8  26.0 - 34.0 (pg)    MCHC 34.8  30.0 - 36.0 (g/dL)    RDW 78.4  69.6 - 29.5 (%)    Platelets 182  150 - 400 (K/uL)      HPI : The patient is a 46 year old man with a past medical history significant for a nonspecific myopathy and myositis, chronic hepatitis C infection, and alcohol abuse, who presented to the emergency department on October 17 2011 with confusion. The patient had been binging on alcohol for 2 weeks after reportedly being sober for approximately one month. At home, he was confused, walking around in circles, and falling. In the emergency department, he was intoxicated and agitated. He began having twitching. He was given Ativan multiple times to decrease his agitation. The twitching resolved following the Ativan. He was tachycardic and afebrile. His lab data were significant for a serum sodium of 148, AST of 123, ALT of 99, salicylate level of less than 2, acetaminophen level of less than 15, and alcohol level of 117. His urine drug screen was positive for opiates. The CT scan of his head revealed no acute intracranial abnormalities. He was admitted for further evaluation and management.  HOSPITAL COURSE: The patient was admitted to the step down unit for closer monitoring. IV fluid hydration was started. Intravenous vitamin therapy was started. The Ativan alcohol withdrawal protocol was initiated. Haldol as needed was ordered for additional treatment of agitation. A nicotine patch was placed as well.  Prophylactic Protonix was added empirically for likelihood of alcoholic gastritis. Neurological checks were ordered every 4 hours for the first 36 hours of the hospitalization. For further evaluation, an EEG was ordered, along with a total CK, TSH, and free T4. His EEG revealed no evidence of epileptiform activity. His total CK range from 382 to 415. His MB fraction range from 19.0  on admission to 16.5 at the time of discharge. His troponin I was within normal limits. His TSH was slightly low at 0.24 however his free T4 was within normal limits at 1.06.  The patient became more alert. He was progressively more oriented. He reported that he took 4 or 5 baclofen tablets along with oxycodone to help relieve his muscle pain. He admitted drinking alcohol while taking these medications. He did not recall acting strangely at home. He admitted that he drank too much. I advised him to seek help with stopping drinking. He stated that he had a desire to stop drinking a while ago and successfully stop drinking for one month, but fell off the wagon and started drinking again 2 weeks ago. He did not believe further inpatient or outpatient therapy would be helpful. Nevertheless, the social worker was consulted to provide the patient with outpatient resources. The patient declined.    Following supportive treatment, the patient improved improved clinically and symptomatically. His liver transaminases did decrease with an AST of 69 and ALT of 60 prior to discharge. His hypernatremia resolved. His renal function remained within normal limits. His CBC was within normal limits. He was discharged to home in improved and stable condition. He was strongly advised to stop drinking while taking his chronic medications. He was instructed to followup with his physicians at [facility] clinics.   Discharge Exam:  Blood pressure 130/90, pulse 98,  temperature 97.6 F (36.4 C), temperature source Axillary, resp. rate 16, height 5\' 8"  (1.727 m), weight 60.8 kg (134 lb 0.6 oz), SpO2 99.00%.  Lungs: Clear to auscultation bilaterally. Heart: S1, S2, with no murmurs rubs or gallops. Abdomen: Positive bowel sounds, soft, nontender, nondistended. Extremities: No pedal edema. Neurologic/psychological: He is alert and oriented to himself, hospital, month, and city. His affect is pleasant. He is not tremulous.  Cranial nerves are grossly intact.   Discharge Orders    Future Orders Please Complete By Expires   Diet - low sodium heart healthy      Diet general      Increase activity slowly      Discharge instructions      Comments:   DO NOT DRINK ALCOHOL WITH YOUR MEDICINES. SEEK ASSISTANCE TO STOP DRINKING ALCOHOL.      Follow-up Information    Please follow up. (Follow up with your doctors at [facility] as scheduled.)          Total discharge time: 40 minutes.   Signed: Roczen Waymire 10/19/2011, 3:03 PM

## 2011-10-30 ENCOUNTER — Encounter (HOSPITAL_COMMUNITY): Payer: Self-pay

## 2011-10-30 ENCOUNTER — Emergency Department (HOSPITAL_COMMUNITY)
Admission: EM | Admit: 2011-10-30 | Discharge: 2011-10-30 | Disposition: A | Discharge status: Home / Self Care | Payer: Medicaid Other | Attending: Emergency Medicine | Admitting: Emergency Medicine

## 2011-10-30 DIAGNOSIS — G894 Chronic pain syndrome: Secondary | ICD-10-CM | POA: Insufficient documentation

## 2011-10-30 DIAGNOSIS — F1410 Cocaine abuse, uncomplicated: Secondary | ICD-10-CM | POA: Insufficient documentation

## 2011-10-30 DIAGNOSIS — K7460 Unspecified cirrhosis of liver: Secondary | ICD-10-CM | POA: Insufficient documentation

## 2011-10-30 DIAGNOSIS — F1010 Alcohol abuse, uncomplicated: Secondary | ICD-10-CM | POA: Insufficient documentation

## 2011-10-30 DIAGNOSIS — F17200 Nicotine dependence, unspecified, uncomplicated: Secondary | ICD-10-CM | POA: Insufficient documentation

## 2011-10-30 DIAGNOSIS — B1920 Unspecified viral hepatitis C without hepatic coma: Secondary | ICD-10-CM | POA: Insufficient documentation

## 2011-10-30 DIAGNOSIS — M519 Unspecified thoracic, thoracolumbar and lumbosacral intervertebral disc disorder: Secondary | ICD-10-CM | POA: Insufficient documentation

## 2011-10-30 DIAGNOSIS — M79606 Pain in leg, unspecified: Secondary | ICD-10-CM

## 2011-10-30 DIAGNOSIS — M549 Dorsalgia, unspecified: Secondary | ICD-10-CM | POA: Insufficient documentation

## 2011-10-30 DIAGNOSIS — M79609 Pain in unspecified limb: Secondary | ICD-10-CM | POA: Insufficient documentation

## 2011-10-30 MED ORDER — ONDANSETRON HCL 4 MG PO TABS
4.0000 mg | ORAL_TABLET | Freq: Once | ORAL | Status: AC
  Administered 2011-10-30: 4 mg via ORAL
  Filled 2011-10-30: qty 1

## 2011-10-30 MED ORDER — HYDROMORPHONE HCL PF 1 MG/ML IJ SOLN
2.0000 mg | Freq: Once | INTRAMUSCULAR | Status: AC
  Administered 2011-10-30: 2 mg via INTRAMUSCULAR
  Filled 2011-10-30: qty 2

## 2011-10-30 NOTE — ED Provider Notes (Signed)
History    This chart was scribed for [employer]. Colon Branch, MD, MD by Smitty Pluck. The patient was seen in room APAH2 and the patient's care was started at 7:21PM.   CSN: [id number]  Arrival date & time 10/30/11  1719   First MD Initiated Contact with Patient 10/30/11 1859      No chief complaint on file.   (Consider location/radiation/quality/duration/timing/severity/associated sxs/prior treatment) The history is provided by the patient and the EMS personnel.   [name] is a 46 y.o. male who presents to the Emergency Department complaining of disc pain causing leg pain and he has been unable to walk for the past 3 days. Pt reports seeing Dr. Earley Abide (neurosurgeon) at [facility] for disc problems. He was seen in [place] at hospital and was given MRI and that's when he was diagnosed with disc problem. He reports that he was seen in ED recently but doesn't remember what the treatment was (Pt had encephalothopathy results show he had a combination of alcohol and drugs in his system). He was given oxycodone and baclofen but he stopped taking them due to no relief. Pt reports that he has cough causing his chest to be sore. EMS reports that the patient admitted to taking some of his mothers xanax and has been drinking.    Past Medical History  Diagnosis Date  . Hepatitis C   . Myositis     Left deltoid biopsy at [facility] 10/11/11.  Marland Kitchen Cirrhosis   . Chronic pain syndrome   . 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 Alcohol abuse     Psychiatric admissions for alcohol and drug abuse  . History of cocaine abuse   . Depression     Multiple psychiatric admissions  . Tobacco abuse   . Tattoos   . Low TSH level January 2013    Normal free T4 of 1.06    Past Surgical History  Procedure Date  . Hernia repair   . Muscle biopsy   . Cardiac catheterization June 2006    Normal coronary arteries    History reviewed. No pertinent family history.  History  Substance Use Topics  . Smoking status: Current Everyday  Smoker  . Smokeless tobacco: Not on file  . Alcohol Use: Yes      Review of Systems  All other systems reviewed and are negative.   10 Systems reviewed and are negative for acute change except as noted in the HPI.  Allergies  Propoxyphene n-acetaminophen  Home Medications   Current Outpatient Rx  Name Route Sig Dispense Refill  . BACLOFEN 10 MG PO TABS Oral Take 10 mg by mouth 3 (three) times daily.    . ADULT MULTIVITAMIN W/MINERALS CH Oral Take 1 tablet by mouth daily.    Marland Kitchen OMEPRAZOLE 20 MG PO CPDR Oral Take 20 mg by mouth 2 (two) times daily.    . OXYCODONE HCL 5 MG PO TABS Oral Take 5 mg by mouth every 4 (four) hours as needed. Pain    . PREGABALIN 150 MG PO CAPS Oral Take 150 mg by mouth 3 (three) times daily.      BP 100/73  Pulse 126  Temp(Src) 98.7 F (37.1 C) (Oral)  Ht 5\' 8"  (1.727 m)  Wt 140 lb (63.504 kg)  BMI 21.29 kg/m2  SpO2 98%  Physical Exam  Nursing note and vitals reviewed. Constitutional: He is oriented to person, place, and time. No distress.       Slightly disheveled and unshaven And in  no acute distress  HENT:  Head: Normocephalic and atraumatic.  Right Ear: External ear normal.  Left Ear: External ear normal.  Eyes: EOM are normal. Pupils are equal, round, and reactive to light.  Neck: Normal range of motion. Neck supple.  Cardiovascular: Normal rate, regular rhythm and normal heart sounds.   No murmur heard. Pulmonary/Chest: Effort normal and breath sounds normal. No respiratory distress.  Abdominal: Soft. Bowel sounds are normal. He exhibits no distension. There is no tenderness.  Musculoskeletal: Normal range of motion. He exhibits no tenderness.  Neurological: He is alert and oriented to person, place, and time.  Skin: Skin is warm and dry.  Psychiatric: He has a normal mood and affect. His behavior is normal.    ED Course  Procedures (including critical care time)  DIAGNOSTIC STUDIES: Oxygen Saturation is 98% on room air,  normal by my interpretation.    COORDINATION OF CARE:  7:25PM EDP discusses pt ED treatment course with pt.      MDM  Patient with known disc disease here with continued back pain and leg pain and weakness. He was scheduled to see a neurosurgeon at [facility] tomorrow and appointment is being rescheduled. Given analgesics with some improvement.Pt stable in ED with no significant deterioration in condition.The patient appears reasonably screened and/or stabilized for discharge and I doubt any other medical condition or other [facility] requiring further screening, evaluation, or treatment in the ED at this time prior to discharge.      I personally performed the services described in this documentation, which was scribed in my presence. The recorded information has been reviewed and considered. MDM Reviewed: nursing note, vitals and previous chart Reviewed previous: labs, ECG, x-ray and MRI     Nicoletta Dress. Colon Branch, MD 10/30/11 2047

## 2011-10-30 NOTE — ED Notes (Signed)
Pt states he was spitting up what he thought was blood yesterday. States his chest is sore and his legs hurt. States he has a muscle disease in his legs and he has not been able to walk in three days. Pt admitted to EMS he had taken some of his mothers xanax and been drinking today

## 2011-10-30 NOTE — ED Notes (Signed)
Pt requested pain medication script for home. Discussed with MD. MD not prescribed any medication at this time. Informed pt. Educated pt to follow up with primary MD Pt stated that he had tried today then pt stated that he could not remember number. I offered to look up number for pt then pt stated that he could not remember MDs name. Pt informed to come back to ED if pain increased however the ED could not provide chronic pain relieve for him that the ED could only provide emergent issues. Pt verbalized understanding. Pt ambulatory with steady gait at discharge, pt refused wheelchair, however pt was stating that he was having trouble walking. Per RN assessment pt stable.

## 2011-12-01 ENCOUNTER — Encounter (HOSPITAL_COMMUNITY): Payer: Self-pay

## 2011-12-01 ENCOUNTER — Emergency Department (HOSPITAL_COMMUNITY)
Admission: EM | Admit: 2011-12-01 | Discharge: 2011-12-01 | Disposition: A | Discharge status: Home / Self Care | Payer: Self-pay | Attending: Emergency Medicine | Admitting: Emergency Medicine

## 2011-12-01 DIAGNOSIS — F17200 Nicotine dependence, unspecified, uncomplicated: Secondary | ICD-10-CM | POA: Insufficient documentation

## 2011-12-01 DIAGNOSIS — IMO0001 Reserved for inherently not codable concepts without codable children: Secondary | ICD-10-CM | POA: Insufficient documentation

## 2011-12-01 DIAGNOSIS — F329 Major depressive disorder, single episode, unspecified: Secondary | ICD-10-CM | POA: Insufficient documentation

## 2011-12-01 DIAGNOSIS — R55 Syncope and collapse: Secondary | ICD-10-CM | POA: Insufficient documentation

## 2011-12-01 DIAGNOSIS — R059 Cough, unspecified: Secondary | ICD-10-CM | POA: Insufficient documentation

## 2011-12-01 DIAGNOSIS — Z79899 Other long term (current) drug therapy: Secondary | ICD-10-CM | POA: Insufficient documentation

## 2011-12-01 DIAGNOSIS — R05 Cough: Secondary | ICD-10-CM | POA: Insufficient documentation

## 2011-12-01 DIAGNOSIS — F3289 Other specified depressive episodes: Secondary | ICD-10-CM | POA: Insufficient documentation

## 2011-12-01 DIAGNOSIS — G8929 Other chronic pain: Secondary | ICD-10-CM | POA: Insufficient documentation

## 2011-12-01 LAB — BASIC METABOLIC PANEL
BUN: 3 mg/dL — ABNORMAL LOW (ref 6–23)
CO2: 30 mEq/L (ref 19–32)
Calcium: 9.2 mg/dL (ref 8.4–10.5)
Chloride: 102 mEq/L (ref 96–112)
Creatinine, Ser: 0.71 mg/dL (ref 0.50–1.35)
GFR calc Af Amer: >90 mL/min (ref >90)
GFR calc non Af Amer: >90 mL/min (ref >90)
Glucose, Bld: 100 mg/dL — ABNORMAL HIGH (ref 70–99)
Potassium: 3.4 mEq/L — ABNORMAL LOW (ref 3.5–5.1)
Sodium: 140 mEq/L (ref 135–145)

## 2011-12-01 LAB — HEPATIC FUNCTION PANEL
AST: 188 U/L — ABNORMAL HIGH (ref 0–37)
Albumin: 3.5 g/dL (ref 3.5–5.2)
Alkaline Phosphatase: 90 U/L (ref 39–117)
Total Protein: 7.1 g/dL (ref 6.0–8.3)

## 2011-12-01 LAB — CBC
HCT: 48.1 % (ref 39.0–52.0)
Hemoglobin: 16.9 g/dL (ref 13.0–17.0)
MCH: 31.6 pg (ref 26.0–34.0)
MCHC: 35.1 g/dL (ref 30.0–36.0)
MCV: 90.1 fL (ref 78.0–100.0)
Platelets: 170 10*3/uL (ref 150–400)
RBC: 5.34 MIL/uL (ref 4.22–5.81)
RDW: 14.3 % (ref 11.5–15.5)
WBC: 8.1 10*3/uL (ref 4.0–10.5)

## 2011-12-01 LAB — RAPID URINE DRUG SCREEN, HOSP PERFORMED
Amphetamines: NOT DETECTED
Barbiturates: NOT DETECTED
Benzodiazepines: NOT DETECTED
Cocaine: NOT DETECTED
Opiates: NOT DETECTED
Tetrahydrocannabinol: NOT DETECTED

## 2011-12-01 LAB — DIFFERENTIAL
Basophils Absolute: 0.1 10*3/uL (ref 0.0–0.1)
Basophils Relative: 1 % (ref 0–1)
Eosinophils Absolute: 0.1 10*3/uL (ref 0.0–0.7)
Eosinophils Relative: 1 % (ref 0–5)
Lymphocytes Relative: 18 % (ref 12–46)
Monocytes Absolute: 0.8 10*3/uL (ref 0.1–1.0)

## 2011-12-01 LAB — ETHANOL: Alcohol, Ethyl (B): 115 mg/dL — ABNORMAL HIGH (ref 0–11)

## 2011-12-01 MED ORDER — KETOROLAC TROMETHAMINE 30 MG/ML IJ SOLN
30.0000 mg | Freq: Once | INTRAMUSCULAR | Status: AC
  Administered 2011-12-01: 30 mg via INTRAVENOUS
  Filled 2011-12-01: qty 1

## 2011-12-01 MED ORDER — MORPHINE SULFATE 10 MG/ML IJ SOLN: 10.0000 mg | Freq: Once | INTRAMUSCULAR | Status: DC

## 2011-12-01 MED ORDER — SODIUM CHLORIDE 0.9 % IV SOLN
Freq: Once | INTRAVENOUS | Status: AC
  Administered 2011-12-01: 22:00:00 via INTRAVENOUS

## 2011-12-01 MED ORDER — HYDROMORPHONE HCL PF 1 MG/ML IJ SOLN
1.0000 mg | Freq: Once | INTRAMUSCULAR | Status: AC
  Administered 2011-12-01: 1 mg via INTRAVENOUS
  Filled 2011-12-01: qty 1

## 2011-12-01 NOTE — ED Notes (Signed)
Patient was given an urinal states he could not walk to the restroom. Dr. Ignacia Palma at bedside.

## 2011-12-01 NOTE — ED Provider Notes (Signed)
History   This chart was scribed for Ryan Cooper III, MD by Charolett Bumpers . The patient was seen in room APA19/APA19 and the patient's care was started at 9:05pm.  CSN: [id number]  Arrival date & time 12/01/11  2011   First MD Initiated Contact with Patient 12/01/11 2052      Chief Complaint  Patient presents with  . Drug / Alcohol Assessment    (Consider location/radiation/quality/duration/timing/severity/associated sxs/prior treatment) HPI [name] is a 47 y.o. male who presents to the Emergency Department via EMS after being found unresponsive at [facility] prior to ED arrival. Patient was given 2 mg of narcan by EMS, and showed improvement. Patient remains alert per EMS. Patient states that he does not know why he lost consciousness PTA. Patient reports that he has a h/o myositis for the past 2 years. Patient states that he is in constant, moderate pain. Patient states that he is not being treated for myositis. The patient also reports a hx of Hepatitis C. Patient reports a hx of multiple muscle biopsies and appendectomy. Patient states that he smokes a pack a day. Patient states that he consumed 3-4 alcoholic beverages today.     Past Medical History  Diagnosis Date  . Hepatitis C   . Myositis     Left deltoid biopsy at [facility] 10/11/11.  Marland Kitchen Cirrhosis   . Chronic pain syndrome   . 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 Alcohol abuse     Psychiatric admissions for alcohol and drug abuse  . History of cocaine abuse   . Depression     Multiple psychiatric admissions  . Tobacco abuse   . Tattoos   . Low TSH level January 2013    Normal free T4 of 1.06    Past Surgical History  Procedure Date  . Hernia repair   . Muscle biopsy   . Cardiac catheterization June 2006    Normal coronary arteries    No family history on file.  History  Substance Use Topics  . Smoking status: Current Everyday Smoker  . Smokeless tobacco: Not on file  . Alcohol Use: Yes     Pt drinks a  pint of liquor daily      Review of Systems  Constitutional: Negative for fever.  HENT: Negative for ear pain and sore throat.   Respiratory: Positive for cough.   Gastrointestinal: Negative for nausea, vomiting and diarrhea.  Genitourinary: Negative for dysuria.  Musculoskeletal:       Leg pain bilaterally  Skin: Negative for rash.  All other systems reviewed and are negative.   Allergies  Propoxyphene n-acetaminophen  Home Medications   Current Outpatient Rx  Name Route Sig Dispense Refill  . BACLOFEN 10 MG PO TABS Oral Take 10 mg by mouth 3 (three) times daily.    . ADULT MULTIVITAMIN W/MINERALS CH Oral Take 1 tablet by mouth daily.    Marland Kitchen OMEPRAZOLE 20 MG PO CPDR Oral Take 20 mg by mouth 2 (two) times daily.    Marland Kitchen PREGABALIN 150 MG PO CAPS Oral Take 150 mg by mouth 3 (three) times daily as needed. For pain      BP 154/104  Pulse 100  Temp(Src) 98.5 F (36.9 C) (Oral)  Resp 20  SpO2 96%  Physical Exam  Nursing note and vitals reviewed. Constitutional: He is oriented to person, place, and time. He appears well-developed and well-nourished. No distress.       Could not smell alcohol on the patient's breathe.  HENT:  Head: Normocephalic and atraumatic.  Right Ear: External ear normal.  Left Ear: External ear normal.  Nose: Nose normal.  Mouth/Throat: Oropharynx is clear and moist.  Eyes: EOM are normal. Pupils are equal, round, and reactive to light.  Neck: Normal range of motion. Neck supple. No tracheal deviation present.  Cardiovascular: Normal rate, regular rhythm and normal heart sounds.  Exam reveals no gallop and no friction rub.   No murmur heard. Pulmonary/Chest: Effort normal and breath sounds normal. No respiratory distress. He has no wheezes. He has no rales.  Abdominal: Soft. Bowel sounds are normal. He exhibits no distension. There is no tenderness.  Musculoskeletal: Normal range of motion. He exhibits tenderness (Mild tenderness of calf and thigh  bilaterally. ). He exhibits no edema.  Neurological: He is alert and oriented to person, place, and time. No sensory deficit.  Skin: Skin is warm and dry.  Psychiatric: He has a normal mood and affect. His behavior is normal.    ED Course  Procedures (including critical care time)  DIAGNOSTIC STUDIES: Oxygen Saturation is 96% on room air, adequate by my interpretation.    COORDINATION OF CARE:  2120: Discussed the planned course of treatment with the patient and the patient is agreeable at this time.  2215: Informed the patient of his lab results. Re-evaluated the patient for suicidal ideations. Patient is ready for d/c and patient is agreeable.   Results for orders placed during the hospital encounter of 12/01/11  CBC      Component Value Range   WBC 8.1  4.0 - 10.5 (K/uL)   RBC 5.34  4.22 - 5.81 (MIL/uL)   Hemoglobin 16.9  13.0 - 17.0 (g/dL)   HCT 16.1  09.6 - 04.5 (%)   MCV 90.1  78.0 - 100.0 (fL)   MCH 31.6  26.0 - 34.0 (pg)   MCHC 35.1  30.0 - 36.0 (g/dL)   RDW 40.9  81.1 - 91.4 (%)   Platelets 170  150 - 400 (K/uL)  DIFFERENTIAL      Component Value Range   Neutrophils Relative 71  43 - 77 (%)   Neutro Abs 5.7  1.7 - 7.7 (K/uL)   Lymphocytes Relative 18  12 - 46 (%)   Lymphs Abs 1.4  0.7 - 4.0 (K/uL)   Monocytes Relative 10  3 - 12 (%)   Monocytes Absolute 0.8  0.1 - 1.0 (K/uL)   Eosinophils Relative 1  0 - 5 (%)   Eosinophils Absolute 0.1  0.0 - 0.7 (K/uL)   Basophils Relative 1  0 - 1 (%)   Basophils Absolute 0.1  0.0 - 0.1 (K/uL)  BASIC METABOLIC PANEL      Component Value Range   Sodium 140  135 - 145 (mEq/L)   Potassium 3.4 (*) 3.5 - 5.1 (mEq/L)   Chloride 102  96 - 112 (mEq/L)   CO2 30  19 - 32 (mEq/L)   Glucose, Bld 100 (*) 70 - 99 (mg/dL)   BUN 3 (*) 6 - 23 (mg/dL)   Creatinine, Ser 7.82  0.50 - 1.35 (mg/dL)   Calcium 9.2  8.4 - 95.6 (mg/dL)   GFR calc non Af Amer >90  >90 (mL/min)   GFR calc Af Amer >90  >90 (mL/min)  ETHANOL      Component Value  Range   Alcohol, Ethyl (B) 115 (*) 0 - 11 (mg/dL)  URINE RAPID DRUG SCREEN (HOSP PERFORMED)      Component Value Range  Opiates NONE DETECTED  NONE DETECTED    Cocaine NONE DETECTED  NONE DETECTED    Benzodiazepines NONE DETECTED  NONE DETECTED    Amphetamines NONE DETECTED  NONE DETECTED    Tetrahydrocannabinol NONE DETECTED  NONE DETECTED    Barbiturates NONE DETECTED  NONE DETECTED   HEPATIC FUNCTION PANEL      Component Value Range   Total Protein 7.1  6.0 - 8.3 (g/dL)   Albumin 3.5  3.5 - 5.2 (g/dL)   AST 213 (*) 0 - 37 (U/L)   ALT 96 (*) 0 - 53 (U/L)   Alkaline Phosphatase 90  39 - 117 (U/L)   Total Bilirubin 0.4  0.3 - 1.2 (mg/dL)   Bilirubin, Direct 0.1  0.0 - 0.3 (mg/dL)   Indirect Bilirubin 0.3  0.3 - 0.9 (mg/dL)      1. Syncope   2. Chronic pain      I personally performed the services described in this documentation, which was scribed in my presence. The recorded information has been reviewed and considered.  Osvaldo Human, M.D.    Ryan Cooper III, MD 12/01/11 2220

## 2011-12-01 NOTE — ED Notes (Signed)
Patient states he is still in pain at this time.

## 2011-12-01 NOTE — ED Notes (Signed)
Pt found unresponsive at [employer], evaluated by fire rescue, given 2 mg narcan with improvement.   Pt remains alert per ems.

## 2011-12-01 NOTE — Discharge Instructions (Signed)
Mr. Marriott, you had physical examination and laboratory testing to check on you after you were found unconscious in a car this evening.  Fortunately, your examination and laboratory tests were good.  It is safe to go home.  Dr. Ignacia Palma ordered you a dose of Dilaudid for your chronic leg pain.

## 2011-12-10 ENCOUNTER — Inpatient Hospital Stay (HOSPITAL_COMMUNITY)
Admission: EM | Admit: 2011-12-10 | Discharge: 2011-12-12 | DRG: 917 | Disposition: A | Discharge status: Home / Self Care | Payer: Self-pay | Attending: Internal Medicine | Admitting: Internal Medicine

## 2011-12-10 ENCOUNTER — Other Ambulatory Visit: Payer: Self-pay

## 2011-12-10 ENCOUNTER — Encounter (HOSPITAL_COMMUNITY): Payer: Self-pay | Admitting: *Deleted

## 2011-12-10 ENCOUNTER — Emergency Department (HOSPITAL_COMMUNITY): Payer: Self-pay

## 2011-12-10 DIAGNOSIS — R0902 Hypoxemia: Secondary | ICD-10-CM

## 2011-12-10 DIAGNOSIS — E870 Hyperosmolality and hypernatremia: Secondary | ICD-10-CM | POA: Diagnosis present

## 2011-12-10 DIAGNOSIS — F10929 Alcohol use, unspecified with intoxication, unspecified: Secondary | ICD-10-CM

## 2011-12-10 DIAGNOSIS — IMO0001 Reserved for inherently not codable concepts without codable children: Secondary | ICD-10-CM | POA: Diagnosis present

## 2011-12-10 DIAGNOSIS — F1010 Alcohol abuse, uncomplicated: Secondary | ICD-10-CM | POA: Diagnosis present

## 2011-12-10 DIAGNOSIS — T481X4A Poisoning by skeletal muscle relaxants [neuromuscular blocking agents], undetermined, initial encounter: Principal | ICD-10-CM | POA: Diagnosis present

## 2011-12-10 DIAGNOSIS — R000 Tachycardia, unspecified: Secondary | ICD-10-CM | POA: Diagnosis present

## 2011-12-10 DIAGNOSIS — B1920 Unspecified viral hepatitis C without hepatic coma: Secondary | ICD-10-CM | POA: Diagnosis present

## 2011-12-10 DIAGNOSIS — G9341 Metabolic encephalopathy: Secondary | ICD-10-CM | POA: Diagnosis present

## 2011-12-10 DIAGNOSIS — F17200 Nicotine dependence, unspecified, uncomplicated: Secondary | ICD-10-CM | POA: Diagnosis present

## 2011-12-10 DIAGNOSIS — G894 Chronic pain syndrome: Secondary | ICD-10-CM | POA: Diagnosis present

## 2011-12-10 DIAGNOSIS — T50901A Poisoning by unspecified drugs, medicaments and biological substances, accidental (unintentional), initial encounter: Secondary | ICD-10-CM

## 2011-12-10 DIAGNOSIS — R4182 Altered mental status, unspecified: Secondary | ICD-10-CM | POA: Diagnosis present

## 2011-12-10 DIAGNOSIS — E860 Dehydration: Secondary | ICD-10-CM | POA: Diagnosis present

## 2011-12-10 LAB — CBC
HCT: 50.9 % (ref 39.0–52.0)
Platelets: 226 10*3/uL (ref 150–400)
RBC: 5.55 MIL/uL (ref 4.22–5.81)
RDW: 14.8 % (ref 11.5–15.5)
WBC: 10.8 10*3/uL — ABNORMAL HIGH (ref 4.0–10.5)

## 2011-12-10 LAB — COMPREHENSIVE METABOLIC PANEL
ALT: 78 U/L — ABNORMAL HIGH (ref 0–53)
CO2: 26 mEq/L (ref 19–32)
Calcium: 8.5 mg/dL (ref 8.4–10.5)
Creatinine, Ser: 0.68 mg/dL (ref 0.50–1.35)
GFR calc Af Amer: >90 mL/min (ref >90)
GFR calc non Af Amer: >90 mL/min (ref >90)
Glucose, Bld: 107 mg/dL — ABNORMAL HIGH (ref 70–99)

## 2011-12-10 LAB — URINALYSIS, ROUTINE W REFLEX MICROSCOPIC
Leukocytes, UA: NEGATIVE
Nitrite: NEGATIVE
Specific Gravity, Urine: <1.005 — ABNORMAL LOW (ref 1.005–1.030)
pH: 6 (ref 5.0–8.0)

## 2011-12-10 LAB — DIFFERENTIAL
Basophils Absolute: 0.1 10*3/uL (ref 0.0–0.1)
Lymphocytes Relative: 48 % — ABNORMAL HIGH (ref 12–46)
Neutro Abs: 3.9 10*3/uL (ref 1.7–7.7)

## 2011-12-10 LAB — ACETAMINOPHEN LEVEL: Acetaminophen (Tylenol), Serum: <15 ug/mL (ref 10–30)

## 2011-12-10 LAB — ETHANOL: Alcohol, Ethyl (B): 198 mg/dL — ABNORMAL HIGH (ref 0–11)

## 2011-12-10 LAB — TROPONIN I: Troponin I: <0.3 ng/mL (ref <0.30)

## 2011-12-10 LAB — RAPID URINE DRUG SCREEN, HOSP PERFORMED: Amphetamines: NOT DETECTED

## 2011-12-10 MED ORDER — ZOLPIDEM TARTRATE 5 MG PO TABS: 5.0000 mg | ORAL_TABLET | Freq: Every evening | ORAL | Status: DC | PRN

## 2011-12-10 MED ORDER — ACETAMINOPHEN 325 MG PO TABS: 650.0000 mg | ORAL_TABLET | ORAL | Status: DC | PRN

## 2011-12-10 MED ORDER — NICOTINE 21 MG/24HR TD PT24: 21.0000 mg | MEDICATED_PATCH | Freq: Every day | TRANSDERMAL | Status: DC | PRN

## 2011-12-10 MED ORDER — SODIUM CHLORIDE 0.9 % IV SOLN
1000.0000 mL | INTRAVENOUS | Status: DC
  Administered 2011-12-10 – 2011-12-12 (×4): 1000 mL via INTRAVENOUS

## 2011-12-10 MED ORDER — IBUPROFEN 600 MG PO TABS: 600.0000 mg | ORAL_TABLET | Freq: Three times a day (TID) | ORAL | Status: DC | PRN

## 2011-12-10 MED ORDER — ONDANSETRON HCL 4 MG PO TABS: 4.0000 mg | ORAL_TABLET | Freq: Three times a day (TID) | ORAL | Status: DC | PRN

## 2011-12-10 MED ORDER — SODIUM CHLORIDE 0.9 % IV SOLN
1000.0000 mL | Freq: Once | INTRAVENOUS | Status: AC
  Administered 2011-12-10: 1000 mL via INTRAVENOUS

## 2011-12-10 NOTE — ED Provider Notes (Signed)
History     CSN: [id number]  Arrival date & time 12/10/11  1749   First MD Initiated Contact with Patient 12/10/11 1805      Chief Complaint  Patient presents with  . Drug Overdose    (Consider location/radiation/quality/duration/timing/severity/associated sxs/prior treatment) Patient is a 46 y.o. male presenting with Overdose. The history is provided by the patient.  Drug Overdose This is a new problem. Episode onset: Unknown time. Associated symptoms comments: Chronic back pain.   Level V Caveat- altered mental status  Past Medical History  Diagnosis Date  . Hepatitis C   . Myositis     Left deltoid biopsy at [facility] 10/11/11.  Marland Kitchen Cirrhosis   . Chronic pain syndrome   . 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 Alcohol abuse     Psychiatric admissions for alcohol and drug abuse  . History of cocaine abuse   . Depression     Multiple psychiatric admissions  . Tobacco abuse   . Tattoos   . Low TSH level January 2013    Normal free T4 of 1.06    Past Surgical History  Procedure Date  . Hernia repair   . Muscle biopsy   . Cardiac catheterization June 2006    Normal coronary arteries    History reviewed. No pertinent family history.  History  Substance Use Topics  . Smoking status: Current Everyday Smoker  . Smokeless tobacco: Not on file  . Alcohol Use: Yes     Pt drinks a pint of liquor daily      Review of Systems  Unable to perform ROS   Allergies  Propoxyphene n-acetaminophen  Home Medications   Current Outpatient Rx  Name Route Sig Dispense Refill  . BACLOFEN 10 MG PO TABS Oral Take 10 mg by mouth 3 (three) times daily.    . ADULT MULTIVITAMIN W/MINERALS CH Oral Take 1 tablet by mouth daily.    Marland Kitchen OMEPRAZOLE 20 MG PO CPDR Oral Take 20 mg by mouth 2 (two) times daily.    Marland Kitchen PREGABALIN 150 MG PO CAPS Oral Take 150 mg by mouth 3 (three) times daily as needed. For pain      BP 127/79  Pulse 111  Resp 12  SpO2 94%  Physical Exam  Nursing note and vitals  reviewed. Constitutional: He appears well-developed. He appears distressed.       Is lethargic, and globally weak  HENT:  Head: Normocephalic and atraumatic.  Right Ear: External ear normal.  Left Ear: External ear normal.  Eyes: EOM are normal. Pupils are equal, round, and reactive to light.       Conjunctiva injected bilaterally  Neck: Normal range of motion and phonation normal. Neck supple.  Cardiovascular: Normal rate, regular rhythm, normal heart sounds and intact distal pulses.   Pulmonary/Chest: Effort normal and breath sounds normal. He exhibits no bony tenderness.  Abdominal: Soft. Normal appearance and bowel sounds are normal. He exhibits no mass. There is no tenderness.  Musculoskeletal: Normal range of motion. He exhibits no edema and no tenderness.  Neurological: He is alert. He has normal strength. No cranial nerve deficit or sensory deficit. He exhibits normal muscle tone.       He follows commands for physical exam. He cannot participate in strength testing  Skin: Skin is warm, dry and intact. No rash noted.  Psychiatric:       Depressed-appearing, tearful    ED Course  Procedures (including critical care time)  Initial assessment is overdose with sedating  medication; no apparent need for ventilatory or hemodynamic support. Time of ingestion is unknown therefore, charcoal was not indicated. Screening labs ordered   10:06 PM Reevaluation with update and discussion. After initial assessment and treatment, an updated evaluation reveals he remains sleepy, but arousable and communicative although difficult to understand. Pravin Perezperez L    Date: 12/10/2011  Rate: 110  Rhythm: sinus tachycardia  QRS Axis: normal  Intervals: normal  ST/T Wave abnormalities: normal  Conduction Disutrbances:none  Narrative Interpretation: poor R wave progression  Old EKG Reviewed: unchanged 20:15- to control his contacted again by the nurse. They feel that the patient should be watched  in the emergency department until he is alert and lucid. The baclofen is sedating. His alcohol level at 1912 was 198. Considering just the alcohol level it will likely be 2 to 3 in the morning before he is recovered from that sedating effect.  10:06 PM Reevaluation with update and discussion. After initial assessment and treatment, an updated evaluation reveals Recent vitals indicated, hypoxia at 88% on room air. He was placed on nasal oxygen at 2 L and the O2 sat is now 94%. He is sitting in a semi-Fowler's position at this time. Holding orders written. Laportia Carley L   Labs Reviewed  CBC - Abnormal; Notable for the following:    WBC 10.8 (*)    Hemoglobin 17.6 (*)    All other components within normal limits  DIFFERENTIAL - Abnormal; Notable for the following:    Neutrophils Relative 36 (*)    Lymphocytes Relative 48 (*)    Lymphs Abs 5.2 (*)    Monocytes Absolute 1.3 (*)    All other components within normal limits  URINALYSIS, ROUTINE W REFLEX MICROSCOPIC - Abnormal; Notable for the following:    Color, Urine STRAW (*)    Specific Gravity, Urine <1.005 (*)    All other components within normal limits  COMPREHENSIVE METABOLIC PANEL - Abnormal; Notable for the following:    Glucose, Bld 107 (*)    BUN 5 (*)    Albumin 2.9 (*)    AST 114 (*)    ALT 78 (*)    Total Bilirubin 0.2 (*)    All other components within normal limits  SALICYLATE LEVEL - Abnormal; Notable for the following:    Salicylate Lvl <2.0 (*)    All other components within normal limits  ETHANOL - Abnormal; Notable for the following:    Alcohol, Ethyl (B) 198 (*)    All other components within normal limits  ACETAMINOPHEN LEVEL  URINE RAPID DRUG SCREEN (HOSP PERFORMED)  TROPONIN I  URINE CULTURE   Dg Chest Port 1 View  12/10/2011  *RADIOLOGY REPORT*  Clinical Data: Drug overdose.  PORTABLE CHEST - 1 VIEW 12/10/2011:  Comparison: Portable chest x-ray 02/24/2005 [facility] and two-view chest x-ray  02/20/2005 [facility].  CTA chest [facility] 10/08/2009.  Findings: Suboptimal inspiration accounts for crowded bronchovascular markings, especially in the lung bases, and accentuates the cardiac silhouette.  Taking this into account, cardiomediastinal silhouette unremarkable and lungs clear.  IMPRESSION: Suboptimal inspiration.  No acute cardiopulmonary disease.  Original Report Authenticated By: Arnell Sieving, M.D.     1. Overdose   2. Alcohol abuse   3. Alcohol intoxication   4. Hypoxia       MDM  Drug overdose with baclofen, that appears to be sedating, but no currently causing hypotension or gastric distress. He is also intoxicated, with alcohol. He required 6 hours  of observation in the emergency department prior to consideration for disposition. Screening laboratory evaluations are consistent with substance abuse and alcoholic hepatitis. He is currently dehydrated. Hypoxia, likely due to to sedation. Chest x-ray does not indicate CHF, pneumonia; and there has been no history of vomiting that could result in aspiration.    22:00 Care to Dr. Adriana Simas, and oncoming team to manage the patient. He will require reassessments, O2 sat monitor, and consideration for psychiatric care when can be done.       Flint Melter, MD 12/10/11 2207

## 2011-12-10 NOTE — ED Notes (Signed)
Phoned poison control about patient status. She  recommened patient to be at baseline and able to hold on normal conversation and able to eat and drink

## 2011-12-10 NOTE — ED Notes (Signed)
Pt arrived via ems d/t overdose of Baclofen taking 16 10mg  tablets aprox an hour ago and has drank 5 12oz beers. Pt told ems he did not want to kill himself but he would rather be dead than in this pain. Poison control [employer] called and said concerns for aspiration, check tylenol lever, give fluids via iv, watch for hypotension and bradycardia. Pt is drowsy but was able to slide from ems stretcher to bed.

## 2011-12-10 NOTE — ED Notes (Signed)
Patients oxygen level dropped to 88 room air. RN Boyd Kerbs put patient on 2 liters of oxygen. Oxygen level at 94%

## 2011-12-10 NOTE — ED Notes (Signed)
Patient linen changed due to patient urinating in bed.patient unable to hold urinal in hand

## 2011-12-11 ENCOUNTER — Encounter (HOSPITAL_COMMUNITY): Payer: Self-pay

## 2011-12-11 ENCOUNTER — Emergency Department (HOSPITAL_COMMUNITY): Payer: Self-pay

## 2011-12-11 DIAGNOSIS — T50901A Poisoning by unspecified drugs, medicaments and biological substances, accidental (unintentional), initial encounter: Secondary | ICD-10-CM | POA: Diagnosis present

## 2011-12-11 DIAGNOSIS — G9341 Metabolic encephalopathy: Secondary | ICD-10-CM | POA: Diagnosis present

## 2011-12-11 LAB — COMPREHENSIVE METABOLIC PANEL
ALT: 78 U/L — ABNORMAL HIGH (ref 0–53)
AST: 98 U/L — ABNORMAL HIGH (ref 0–37)
Alkaline Phosphatase: 99 U/L (ref 39–117)
CO2: 29 mEq/L (ref 19–32)
Chloride: 112 mEq/L (ref 96–112)
GFR calc Af Amer: >90 mL/min (ref >90)
GFR calc non Af Amer: >90 mL/min (ref >90)
Glucose, Bld: 124 mg/dL — ABNORMAL HIGH (ref 70–99)
Potassium: 4.2 mEq/L (ref 3.5–5.1)
Sodium: 148 mEq/L — ABNORMAL HIGH (ref 135–145)
Total Bilirubin: 0.4 mg/dL (ref 0.3–1.2)

## 2011-12-11 LAB — URINE CULTURE: Culture  Setup Time: 201303242040

## 2011-12-11 LAB — ETHANOL: Alcohol, Ethyl (B): <11 mg/dL (ref 0–11)

## 2011-12-11 LAB — CBC
HCT: 54.3 % — ABNORMAL HIGH (ref 39.0–52.0)
Hemoglobin: 18.2 g/dL — ABNORMAL HIGH (ref 13.0–17.0)
MCHC: 33.5 g/dL (ref 30.0–36.0)
RBC: 5.77 MIL/uL (ref 4.22–5.81)

## 2011-12-11 LAB — AMMONIA: Ammonia: 28 umol/L (ref 11–60)

## 2011-12-11 MED ORDER — ZIPRASIDONE MESYLATE 20 MG IM SOLR
INTRAMUSCULAR | Status: AC
  Administered 2011-12-11: 10 mg
  Filled 2011-12-11: qty 20

## 2011-12-11 MED ORDER — OXYCODONE-ACETAMINOPHEN 5-325 MG PO TABS
1.0000 | ORAL_TABLET | ORAL | Status: DC | PRN
  Administered 2011-12-11 – 2011-12-12 (×4): 2 via ORAL
  Filled 2011-12-11 (×4): qty 2

## 2011-12-11 MED ORDER — FOLIC ACID 1 MG PO TABS
1.0000 mg | ORAL_TABLET | Freq: Every day | ORAL | Status: DC
  Administered 2011-12-11 – 2011-12-12 (×2): 1 mg via ORAL
  Filled 2011-12-11 (×2): qty 1

## 2011-12-11 MED ORDER — ZIPRASIDONE MESYLATE 20 MG IM SOLR
INTRAMUSCULAR | Status: AC
  Filled 2011-12-11: qty 20

## 2011-12-11 MED ORDER — LORAZEPAM 1 MG PO TABS: 0.0000 mg | ORAL_TABLET | Freq: Two times a day (BID) | ORAL | Status: DC

## 2011-12-11 MED ORDER — LORAZEPAM 1 MG PO TABS
0.0000 mg | ORAL_TABLET | Freq: Four times a day (QID) | ORAL | Status: DC
  Administered 2011-12-11 – 2011-12-12 (×3): 1 mg via ORAL
  Filled 2011-12-11: qty 1

## 2011-12-11 MED ORDER — ACETAMINOPHEN 325 MG PO TABS: 650.0000 mg | ORAL_TABLET | Freq: Four times a day (QID) | ORAL | Status: DC | PRN

## 2011-12-11 MED ORDER — ONDANSETRON HCL 4 MG/2ML IJ SOLN: 4.0000 mg | Freq: Four times a day (QID) | INTRAMUSCULAR | Status: DC | PRN

## 2011-12-11 MED ORDER — VITAMIN B-1 100 MG PO TABS
100.0000 mg | ORAL_TABLET | Freq: Every day | ORAL | Status: DC
  Administered 2011-12-11 – 2011-12-12 (×2): 100 mg via ORAL
  Filled 2011-12-11 (×2): qty 1

## 2011-12-11 MED ORDER — ADULT MULTIVITAMIN W/MINERALS CH
1.0000 | ORAL_TABLET | Freq: Every day | ORAL | Status: DC
  Administered 2011-12-11 – 2011-12-12 (×2): 1 via ORAL
  Filled 2011-12-11 (×2): qty 1

## 2011-12-11 MED ORDER — ENOXAPARIN SODIUM 30 MG/0.3ML ~~LOC~~ SOLN
30.0000 mg | SUBCUTANEOUS | Status: DC
  Administered 2011-12-11: 30 mg via SUBCUTANEOUS
  Filled 2011-12-11: qty 0.3

## 2011-12-11 MED ORDER — ONDANSETRON HCL 4 MG PO TABS: 4.0000 mg | ORAL_TABLET | Freq: Four times a day (QID) | ORAL | Status: DC | PRN

## 2011-12-11 MED ORDER — LORAZEPAM 1 MG PO TABS
1.0000 mg | ORAL_TABLET | Freq: Four times a day (QID) | ORAL | Status: DC | PRN
  Filled 2011-12-11 (×2): qty 1

## 2011-12-11 MED ORDER — LORAZEPAM 2 MG/ML IJ SOLN
1.0000 mg | Freq: Four times a day (QID) | INTRAMUSCULAR | Status: DC | PRN
  Administered 2011-12-12: 1 mg via INTRAVENOUS
  Filled 2011-12-11: qty 1

## 2011-12-11 MED ORDER — THIAMINE HCL 100 MG/ML IJ SOLN
Freq: Once | INTRAVENOUS | Status: DC
  Filled 2011-12-11: qty 1000

## 2011-12-11 MED ORDER — ZIPRASIDONE MESYLATE 20 MG IM SOLR
10.0000 mg | Freq: Once | INTRAMUSCULAR | Status: AC
  Administered 2011-12-11: 10 mg via INTRAMUSCULAR

## 2011-12-11 MED ORDER — PANTOPRAZOLE SODIUM 40 MG PO TBEC
40.0000 mg | DELAYED_RELEASE_TABLET | Freq: Every day | ORAL | Status: DC
  Administered 2011-12-11: 40 mg via ORAL
  Filled 2011-12-11 (×2): qty 1

## 2011-12-11 MED ORDER — ACETAMINOPHEN 650 MG RE SUPP: 650.0000 mg | Freq: Four times a day (QID) | RECTAL | Status: DC | PRN

## 2011-12-11 MED ORDER — THIAMINE HCL 100 MG/ML IJ SOLN: 100.0000 mg | Freq: Every day | INTRAMUSCULAR | Status: DC

## 2011-12-11 MED ORDER — SODIUM CHLORIDE 0.9 % IV SOLN
INTRAVENOUS | Status: AC
  Administered 2011-12-11: 18:00:00 via INTRAVENOUS

## 2011-12-11 MED ORDER — ONDANSETRON HCL 4 MG/2ML IJ SOLN: 4.0000 mg | Freq: Three times a day (TID) | INTRAMUSCULAR | Status: AC | PRN

## 2011-12-11 NOTE — ED Notes (Signed)
Patient continues to rest quietly. No distress noted or voiced. Will continue to monitor patient

## 2011-12-11 NOTE — ED Notes (Signed)
Patient attempting to get out of bed. Sitter at side

## 2011-12-11 NOTE — ED Notes (Signed)
Phone called received from poison control Almira Coaster about patient status. States she will call in am about patient and plan of care

## 2011-12-11 NOTE — ED Provider Notes (Addendum)
46yo M, presented to ED last night with etoh and unk med OD (possible chronic pain med or baclofen).  Work up negative other than +etoh in blood.  Pt with episode of desat over night with Sats dropping to 88%, improved into mig-high 90's on O2 N/C.  VSS otherwise, resps easy, neuro non-focal.  Pt continued with AMS this morning, ACT Tommy and I unable to eval pt due to AMS.  Pt will awake, talk with staff perseverating on some words, then rolling over and going to sleep.  Labs repeated with known elevated LFT's, CT-H without acute process.  1310:  T/C to [facility] Dr. Kerry Hough, case discussed, including:  HPI, pertinent PM/SHx, VS/PE, dx testing, ED course and treatment:  Agreeable to admit, requests to obtain regular bed to team 2.   Laray Anger, DO 12/11/11 1320  Laray Anger, DO 12/11/11 1320

## 2011-12-11 NOTE — BH Assessment (Signed)
Assessment Note   [name] is an 46 y.o. male. Patient remains unable to respond to quests.   Axis I: Alcohol Abuse Axis II: Deferred Axis III:  Past Medical History  Diagnosis Date  . Hepatitis C   . Myositis     Left deltoid biopsy at [facility] 10/11/11.  Marland Kitchen Cirrhosis   . Chronic pain syndrome   . 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 Alcohol abuse     Psychiatric admissions for alcohol and drug abuse  . History of cocaine abuse   . Depression     Multiple psychiatric admissions  . Tobacco abuse   . Tattoos   . Low TSH level January 2013    Normal free T4 of 1.06   Axis IV: problems with access to health care services Axis V: 41-50 serious symptoms  Past Medical History:  Past Medical History  Diagnosis Date  . Hepatitis C   . Myositis     Left deltoid biopsy at [facility] 10/11/11.  Marland Kitchen Cirrhosis   . Chronic pain syndrome   . 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 Alcohol abuse     Psychiatric admissions for alcohol and drug abuse  . History of cocaine abuse   . Depression     Multiple psychiatric admissions  . Tobacco abuse   . Tattoos   . Low TSH level January 2013    Normal free T4 of 1.06    Past Surgical History  Procedure Date  . Hernia repair   . Muscle biopsy   . Cardiac catheterization June 2006    Normal coronary arteries    Family History: History reviewed. No pertinent family history.  Social History:  reports that he has been smoking.  He does not have any smokeless tobacco history on file. He reports that he drinks alcohol. He reports that he does not use illicit drugs.  Additional Social History:    Allergies:  Allergies  Allergen Reactions  . Propoxyphene N-Acetaminophen Nausea And Vomiting    Home Medications:  Medications Prior to Admission  Medication Dose Route Frequency Provider Last Rate Last Dose  . 0.9 %  sodium chloride infusion  1,000 mL Intravenous Once Flint Melter, MD 999 mL/hr at 12/10/11 1815 1,000 mL at 12/10/11 1815   Followed by  . 0.9 %   sodium chloride infusion  1,000 mL Intravenous Continuous Flint Melter, MD 125 mL/hr at 12/10/11 1815 1,000 mL at 12/10/11 1815  . acetaminophen (TYLENOL) tablet 650 mg  650 mg Oral Q4H PRN Flint Melter, MD      . ibuprofen (ADVIL,MOTRIN) tablet 600 mg  600 mg Oral Q8H PRN Flint Melter, MD      . nicotine (NICODERM CQ - dosed in mg/24 hours) patch 21 mg  21 mg Transdermal Daily PRN Flint Melter, MD      . ondansetron [facility]) tablet 4 mg  4 mg Oral Q8H PRN Flint Melter, MD      . ziprasidone (GEODON) 20 MG injection        10 mg at 12/11/11 0338  . ziprasidone (GEODON) injection 10 mg  10 mg Intramuscular Once Sunnie Nielsen, MD   10 mg at 12/11/11 0123  . zolpidem (AMBIEN) tablet 5 mg  5 mg Oral QHS PRN Flint Melter, MD       Medications Prior to Admission  Medication Sig Dispense Refill  . baclofen (LIORESAL) 10 MG tablet Take 10 mg by mouth 3 (three) times daily.      Marland Kitchen  Multiple Vitamin (MULITIVITAMIN WITH MINERALS) TABS Take 1 tablet by mouth daily.      Marland Kitchen omeprazole (PRILOSEC) 20 MG capsule Take 20 mg by mouth 2 (two) times daily.      . pregabalin (LYRICA) 150 MG capsule Take 150 mg by mouth 3 (three) times daily as needed. For pain        OB/GYN Status:  No LMP for male patient.  General Assessment Data Location of Assessment: [facility] ACT Assessment: Yes Living Arrangements: Alone Can pt return to current living arrangement?: Yes Admission Status: Voluntary Is patient capable of signing voluntary admission?: Yes Transfer from: Acute Hospital Referral Source: MD  Education Status Is patient currently in school?: No  Risk to self Suicidal Ideation: No Suicidal Intent: No Is patient at risk for suicide?: Yes Suicidal Plan?: No Specify Current Suicidal Plan: overdos Access to Means: No What has been your use of drugs/alcohol within the last 12 months?: etoh and prescription meds Previous Attempts/Gestures: No Triggers for Past Attempts: None known Intentional  Self Injurious Behavior: None Family Suicide History: Unknown Recent stressful life event(s): Other (Comment) (patient unable to respond) Persecutory voices/beliefs?: No Substance abuse history and/or treatment for substance abuse?: Yes Suicide prevention information given to non-admitted patients: Not applicable  Risk to Others Homicidal Ideation: No Thoughts of Harm to Others: No Current Homicidal Intent: No Current Homicidal Plan: No Access to Homicidal Means: No History of harm to others?:  (patient unable to respond) Assessment of Violence: None Noted Does patient have access to weapons?: No Criminal Charges Pending?: No Does patient have a court date: No  Psychosis Hallucinations: None noted Delusions: None noted  Mental Status Report Appear/Hygiene: Disheveled;Poor hygiene Eye Contact: Poor Motor Activity: Restlessness;Unsteady;Agitation Level of Consciousness: Sedated Mood: Other (Comment) (sedated) Affect: Unable to Assess Anxiety Level: None Thought Processes:  (sedated) Judgement: Impaired Orientation: Not oriented  Cognitive Functioning Concentration: Decreased Memory: Recent Impaired;Remote Impaired IQ: Average Insight: Poor Impulse Control: Poor Appetite: Poor Sleep: No Change Vegetative Symptoms: None  Prior Inpatient Therapy Prior Inpatient Therapy:  (unknnown)  Prior Outpatient Therapy Prior Outpatient Therapy:  (unknnown)            Values / Beliefs Cultural Requests During Hospitalization: None Spiritual Requests During Hospitalization: None        Additional Information CIRT Risk: No Elopement Risk: No Does patient have medical clearance?: No     Disposition: Dr. Clarene Duke will continue to seek medical clearence or will admit  Patient to the medical floor. Disposition Disposition of Patient: Other dispositions Other disposition(s): Other (Comment) (patient not medically clear-remain in ED)  On Site Evaluation by:     Reviewed with Physician:     Jake Shark [facility] 12/11/2011 11:47 AM

## 2011-12-11 NOTE — H&P (Signed)
PCP:   No primary provider on file.   Chief Complaint:  Altered mental status  HPI: This is a 46 year old gentleman with history of chronic pain syndrome, hepatitis C, chronic myositis. Patient was brought to the emergency room today with altered mental status. He had reportedly taken extra doses of baclofen as well as consumed alcohol. On arrival he was lethargic and was unable to adequately participate in history. He was monitored in the emergency room for an extensive period of time and when his mental status failed to return completely back to baseline, the hospitalists were called to evaluate for admission. At the time of my evaluation patient has become awake, alert he reports that he had taken a total of 16 tablets of baclofen. He had also consumed alcohol. He denies any suicidal ideation. He reports that he was simply trying to relieve his pain. Patient does have chronic pain. Review of records show that he had a very similar presentation in January where he overdosed on baclofen at that time. He was also found to be hypernatremic and somewhat dehydrated. The patient will be admitted for overnight observation.  Allergies:   Allergies  Allergen Reactions  . Acetaminophen Other (See Comments)    Liver disease       Past Medical History  Diagnosis Date  . Hepatitis C   . Myositis     Left deltoid biopsy at [facility] 10/11/11.  Marland Kitchen Cirrhosis   . Chronic pain syndrome   . 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 Alcohol abuse     Psychiatric admissions for alcohol and drug abuse  . History of cocaine abuse   . Depression     Multiple psychiatric admissions  . Tobacco abuse   . Tattoos   . Low TSH level January 2013    Normal free T4 of 1.06    Past Surgical History  Procedure Date  . Hernia repair   . Muscle biopsy   . Cardiac catheterization June 2006    Normal coronary arteries    Prior to Admission medications   Medication Sig Start Date End Date Taking? Authorizing Provider  baclofen  (LIORESAL) 10 MG tablet Take 10 mg by mouth 3 (three) times daily.   Yes Historical Provider, MD  omeprazole (PRILOSEC) 20 MG capsule Take 20 mg by mouth daily as needed. For heart burn   Yes Historical Provider, MD    Social History:  reports that he has been smoking.  He does not have any smokeless tobacco history on file. He reports that he drinks alcohol. He reports that he does not use illicit drugs.  History reviewed. No pertinent family history.  Review of Systems:  Constitutional: Denies fever, chills, diaphoresis, appetite change and fatigue.  HEENT: Denies photophobia, eye pain, redness, hearing loss, ear pain, congestion, sore throat, rhinorrhea, sneezing, mouth sores, trouble swallowing, neck pain, neck stiffness and tinnitus.   Respiratory: Denies SOB, DOE, cough, chest tightness,  and wheezing.   Cardiovascular: Denies chest pain, palpitations and leg swelling.  Gastrointestinal: Denies nausea, vomiting, abdominal pain, diarrhea, constipation, blood in stool and abdominal distention.  Genitourinary: Denies dysuria, urgency, frequency, hematuria, flank pain and difficulty urinating.  Musculoskeletal: Denies myalgias, back pain, joint swelling, arthralgias and gait problem.  Skin: Denies pallor, rash and wound.  Neurological: Denies dizziness, seizures, syncope, weakness, light-headedness, numbness and headaches.  Hematological: Denies adenopathy. Easy bruising, personal or family bleeding history  Psychiatric/Behavioral: Denies suicidal ideation, mood changes, confusion, nervousness, sleep disturbance and agitation   Physical Exam: Blood  pressure 166/94, pulse 99, resp. rate 18, SpO2 98.00%. General: Patient is sitting up in bed, does not appear to be in any acute distress, alert and oriented x3 HEENT: Normocephalic, atraumatic, pupils are equal round react to light mucous membranes are dry Neck: Supple Chest: Clear to auscultation bilaterally Cardiac: S1, S2, regular rate  and rhythm Abdomen: Soft, nontender, nondistended, bowel sounds are active Extremities, no cyanosis, clubbing, or edema Neurologic: Grossly intact, nonfocal   Labs on Admission:  Results for orders placed during the hospital encounter of 12/10/11 (from the past 48 hour(s))  CBC     Status: Abnormal   Collection Time   12/10/11  6:07 PM      Component Value Range Comment   WBC 10.8 (*) 4.0 - 10.5 (K/uL)    RBC 5.55  4.22 - 5.81 (MIL/uL)    Hemoglobin 17.6 (*) 13.0 - 17.0 (g/dL)    HCT 40.9  81.1 - 91.4 (%)    MCV 91.7  78.0 - 100.0 (fL)    MCH 31.7  26.0 - 34.0 (pg)    MCHC 34.6  30.0 - 36.0 (g/dL)    RDW 78.2  95.6 - 21.3 (%)    Platelets 226  150 - 400 (K/uL)   DIFFERENTIAL     Status: Abnormal   Collection Time   12/10/11  6:07 PM      Component Value Range Comment   Neutrophils Relative 36 (*) 43 - 77 (%)    Neutro Abs 3.9  1.7 - 7.7 (K/uL)    Lymphocytes Relative 48 (*) 12 - 46 (%)    Lymphs Abs 5.2 (*) 0.7 - 4.0 (K/uL)    Monocytes Relative 12  3 - 12 (%)    Monocytes Absolute 1.3 (*) 0.1 - 1.0 (K/uL)    Eosinophils Relative 3  0 - 5 (%)    Eosinophils Absolute 0.3  0.0 - 0.7 (K/uL)    Basophils Relative 1  0 - 1 (%)    Basophils Absolute 0.1  0.0 - 0.1 (K/uL)   COMPREHENSIVE METABOLIC PANEL     Status: Abnormal   Collection Time   12/10/11  6:30 PM      Component Value Range Comment   Sodium 139  135 - 145 (mEq/L)    Potassium 3.6  3.5 - 5.1 (mEq/L)    Chloride 105  96 - 112 (mEq/L)    CO2 26  19 - 32 (mEq/L)    Glucose, Bld 107 (*) 70 - 99 (mg/dL)    BUN 5 (*) 6 - 23 (mg/dL)    Creatinine, Ser 0.86  0.50 - 1.35 (mg/dL)    Calcium 8.5  8.4 - 10.5 (mg/dL)    Total Protein 6.3  6.0 - 8.3 (g/dL)    Albumin 2.9 (*) 3.5 - 5.2 (g/dL)    AST 578 (*) 0 - 37 (U/L)    ALT 78 (*) 0 - 53 (U/L)    Alkaline Phosphatase 89  39 - 117 (U/L)    Total Bilirubin 0.2 (*) 0.3 - 1.2 (mg/dL)    GFR calc non Af Amer >90  >90 (mL/min)    GFR calc Af Amer >90  >90 (mL/min)   SALICYLATE  LEVEL     Status: Abnormal   Collection Time   12/10/11  6:30 PM      Component Value Range Comment   Salicylate Lvl <2.0 (*) 2.8 - 20.0 (mg/dL)   ACETAMINOPHEN LEVEL     Status: Normal   Collection  Time   12/10/11  6:30 PM      Component Value Range Comment   Acetaminophen (Tylenol), Serum <15.0  10 - 30 (ug/mL)   ETHANOL     Status: Abnormal   Collection Time   12/10/11  6:30 PM      Component Value Range Comment   Alcohol, Ethyl (B) 198 (*) 0 - 11 (mg/dL)   TROPONIN I     Status: Normal   Collection Time   12/10/11  6:30 PM      Component Value Range Comment   Troponin I <0.30  <0.30 (ng/mL)   URINALYSIS, ROUTINE W REFLEX MICROSCOPIC     Status: Abnormal   Collection Time   12/10/11  6:31 PM      Component Value Range Comment   Color, Urine STRAW (*) YELLOW     APPearance CLEAR  CLEAR     Specific Gravity, Urine <1.005 (*) 1.005 - 1.030     pH 6.0  5.0 - 8.0     Glucose, UA NEGATIVE  NEGATIVE (mg/dL)    Hgb urine dipstick NEGATIVE  NEGATIVE     Bilirubin Urine NEGATIVE  NEGATIVE     Ketones, ur NEGATIVE  NEGATIVE (mg/dL)    Protein, ur NEGATIVE  NEGATIVE (mg/dL)    Urobilinogen, UA 0.2  0.0 - 1.0 (mg/dL)    Nitrite NEGATIVE  NEGATIVE     Leukocytes, UA NEGATIVE  NEGATIVE  MICROSCOPIC NOT DONE ON URINES WITH NEGATIVE PROTEIN, BLOOD, LEUKOCYTES, NITRITE, OR GLUCOSE <1000 mg/dL.  URINE CULTURE     Status: Normal   Collection Time   12/10/11  6:31 PM      Component Value Range Comment   Specimen Description URINE, CLEAN CATCH      Special Requests NONE      Culture  Setup Time [id number]      Colony Count NO GROWTH      Culture NO GROWTH      Report Status 12/11/2011 FINAL     URINE RAPID DRUG SCREEN (HOSP PERFORMED)     Status: Normal   Collection Time   12/10/11  6:31 PM      Component Value Range Comment   Opiates NONE DETECTED  NONE DETECTED     Cocaine NONE DETECTED  NONE DETECTED     Benzodiazepines NONE DETECTED  NONE DETECTED     Amphetamines NONE DETECTED  NONE  DETECTED     Tetrahydrocannabinol NONE DETECTED  NONE DETECTED     Barbiturates NONE DETECTED  NONE DETECTED    APTT     Status: Normal   Collection Time   12/11/11 11:27 AM      Component Value Range Comment   aPTT 29  24 - 37 (seconds)   AMMONIA     Status: Normal   Collection Time   12/11/11 11:27 AM      Component Value Range Comment   Ammonia 28  11 - 60 (umol/L)   PROTIME-INR     Status: Normal   Collection Time   12/11/11 11:27 AM      Component Value Range Comment   Prothrombin Time 13.7  11.6 - 15.2 (seconds)    INR 1.03  0.00 - 1.49    ETHANOL     Status: Normal   Collection Time   12/11/11 11:27 AM      Component Value Range Comment   Alcohol, Ethyl (B) <11  0 - 11 (mg/dL)   COMPREHENSIVE METABOLIC PANEL  Status: Abnormal   Collection Time   12/11/11 11:27 AM      Component Value Range Comment   Sodium 148 (*) 135 - 145 (mEq/L) DELTA CHECK NOTED   Potassium 4.2  3.5 - 5.1 (mEq/L)    Chloride 112  96 - 112 (mEq/L)    CO2 29  19 - 32 (mEq/L)    Glucose, Bld 124 (*) 70 - 99 (mg/dL)    BUN 4 (*) 6 - 23 (mg/dL)    Creatinine, Ser 6.29  0.50 - 1.35 (mg/dL)    Calcium 9.3  8.4 - 10.5 (mg/dL)    Total Protein 7.1  6.0 - 8.3 (g/dL)    Albumin 3.3 (*) 3.5 - 5.2 (g/dL)    AST 98 (*) 0 - 37 (U/L)    ALT 78 (*) 0 - 53 (U/L)    Alkaline Phosphatase 99  39 - 117 (U/L)    Total Bilirubin 0.4  0.3 - 1.2 (mg/dL)    GFR calc non Af Amer >90  >90 (mL/min)    GFR calc Af Amer >90  >90 (mL/min)     Radiological Exams on Admission: Ct Head Wo Contrast  12/11/2011  *RADIOLOGY REPORT*  Clinical Data: Drug overdose.  Mental status changes.  CT HEAD WITHOUT CONTRAST  Technique:  Contiguous axial images were obtained from the base of the skull through the vertex without contrast.  Comparison: 10/17/2011.  Findings: Mild age advanced cerebral atrophy and ventriculomegaly. No extra-axial fluid collections are identified.  No CT findings for acute hemispheric infarction and/or intracranial  hemorrhage. No mass lesions.  The brainstem and cerebellum grossly normal and stable.  The bony structures are intact.  The paranasal sinuses and mastoid air cells are clear.  Globes are intact.  IMPRESSION: No acute intracranial findings or mass lesion. Mild age advanced cerebral atrophy.  Original Report Authenticated By: P. Loralie Champagne, M.D.   Dg Chest Port 1 View  12/10/2011  *RADIOLOGY REPORT*  Clinical Data: Drug overdose.  PORTABLE CHEST - 1 VIEW 12/10/2011:  Comparison: Portable chest x-ray 02/24/2005 [facility] and two-view chest x-ray 02/20/2005 [facility].  CTA chest [facility] 10/08/2009.  Findings: Suboptimal inspiration accounts for crowded bronchovascular markings, especially in the lung bases, and accentuates the cardiac silhouette.  Taking this into account, cardiomediastinal silhouette unremarkable and lungs clear.  IMPRESSION: Suboptimal inspiration.  No acute cardiopulmonary disease.  Original Report Authenticated By: Arnell Sieving, M.D.    Assessment/Plan Principal Problem:  *Overdose Active Problems:  Dehydration  Hypernatremia  Hepatitis C  Alcohol abuse  Chronic pain syndrome  Metabolic encephalopathy  Plan:  Patient's overdose appears to be accidental. His mental status appears to have improved. Due to the recurrent nature of his overdose, we will ask that the act team evaluate him and keep him on 1:1 sitter. We will continue supportive care with IV fluids, antiemetics, pain control. The patient would likely benefit from a referral to a pain management clinic. He also reports that he drinks heavily. He reports that he has a history of alcohol withdrawal. We will start him on Ativan protocol. Patient will be observed overnight, if he is stable in the morning and cleared by act team, then he could possibly be discharged tomorrow.  Time Spent on Admission:  Kaelan Emami [facility] Pager: [phone] 12/11/2011, 5:13  PM

## 2011-12-11 NOTE — ED Notes (Signed)
Patient medicated for agitation. He was attempting to get out of bed and to pull out iv and his cardiac leads. He was cleaned and linen changed of urine for patient was unable to void in urinal properly. He attempted to use urinal but placed it on his sheets

## 2011-12-12 LAB — CBC
HCT: 47.9 % (ref 39.0–52.0)
MCV: 91.8 fL (ref 78.0–100.0)
Platelets: 134 10*3/uL — ABNORMAL LOW (ref 150–400)
RBC: 5.22 MIL/uL (ref 4.22–5.81)
WBC: 7.5 10*3/uL (ref 4.0–10.5)

## 2011-12-12 LAB — COMPREHENSIVE METABOLIC PANEL
Albumin: 3.2 g/dL — ABNORMAL LOW (ref 3.5–5.2)
BUN: 4 mg/dL — ABNORMAL LOW (ref 6–23)
Creatinine, Ser: 0.61 mg/dL (ref 0.50–1.35)
GFR calc Af Amer: >90 mL/min (ref >90)
Glucose, Bld: 109 mg/dL — ABNORMAL HIGH (ref 70–99)
Total Bilirubin: 0.7 mg/dL (ref 0.3–1.2)
Total Protein: 6.5 g/dL (ref 6.0–8.3)

## 2011-12-12 MED ORDER — LORAZEPAM 1 MG PO TABS: 1.0000 mg | ORAL_TABLET | Freq: Three times a day (TID) | ORAL | Status: AC | PRN

## 2011-12-12 MED ORDER — ENOXAPARIN SODIUM 40 MG/0.4ML ~~LOC~~ SOLN: 40.0000 mg | SUBCUTANEOUS | Status: DC

## 2011-12-12 MED ORDER — OXYCODONE-ACETAMINOPHEN 5-325 MG PO TABS: 1.0000 | ORAL_TABLET | Freq: Four times a day (QID) | ORAL | Status: AC | PRN

## 2011-12-12 MED ORDER — NICOTINE 21 MG/24HR TD PT24
21.0000 mg | MEDICATED_PATCH | Freq: Every day | TRANSDERMAL | Status: DC
  Administered 2011-12-12: 21 mg via TRANSDERMAL
  Filled 2011-12-12: qty 1

## 2011-12-12 NOTE — BH Assessment (Signed)
Assessment Note   [name] is an 46 y.o. male.Mr Scallon was admitted to the medical floor after being seen in the ED. He was having trouble becoming alert after having overdosed on Bacafin and alcohol. This is the 2nd accidental overdose this year. Today the patient is adamant that he is not suicidal.; He says he would no harm himself because of his wife and his children. He readily admits that he is an alcoholic. He says he has been drinking since he was a youth. He was recently charged with his 8th DWI. He has not had a charge in 14 years. He has been to prison twice for his DWI's in the past. He states that he is drinking approximately 8 beers a day. His last drinks were on the 24th. He says he drinks now to help with the pain he is in . He has been to a pain clinic in the past but did not find the treatment helpful. He denies any thought to harm himself or others. He is neither hallucinated nor delusional. He is alert and oriented x3. His plan for care is to throw away his pain pills so he will not be taking them and drinking. He also plans to follow up with [facility] for his alcohol abuse and depression. He is willing to accept an appointment with the local pain clinic. Axis I:  Alcohol Dependence;Substance Induced Mood Disorder Axis II: Deferred Axis III:  Past Medical History  Diagnosis Date  . Hepatitis C   . Myositis     Left deltoid biopsy at [facility] 10/11/11.  Marland Kitchen Cirrhosis   . Chronic pain syndrome   . 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 Alcohol abuse     Psychiatric admissions for alcohol and drug abuse  . History of cocaine abuse   . Depression     Multiple psychiatric admissions  . Tobacco abuse   . Tattoos   . Low TSH level January 2013    Normal free T4 of 1.06   Axis IV: other psychosocial or environmental problems, problems related to legal system/crime and problems with access to health care services Axis V: 41-50 serious symptoms  Past Medical History:  Past  Medical History  Diagnosis Date  . Hepatitis C   . Myositis     Left deltoid biopsy at [facility] 10/11/11.  Marland Kitchen Cirrhosis   . Chronic pain syndrome   . 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 Alcohol abuse     Psychiatric admissions for alcohol and drug abuse  . History of cocaine abuse   . Depression     Multiple psychiatric admissions  . Tobacco abuse   . Tattoos   . Low TSH level January 2013    Normal free T4 of 1.06    Past Surgical History  Procedure Date  . Hernia repair   . Muscle biopsy   . Cardiac catheterization June 2006    Normal coronary arteries    Family History: History reviewed. No pertinent family history.  Social History:  reports that he has been smoking.  He does not have any smokeless tobacco history on file. He reports that he drinks about 58.8 ounces of alcohol per week. He reports that he does not use illicit drugs.  Additional Social History:    Allergies:  Allergies  Allergen Reactions  . Acetaminophen Other (See Comments)    Liver disease     Home Medications:  Medications Prior to Admission  Medication Dose Route Frequency Provider Last  Rate Last Dose  . 0.9 %  sodium chloride infusion  1,000 mL Intravenous Once Flint Melter, MD 999 mL/hr at 12/10/11 1815 1,000 mL at 12/10/11 1815   Followed by  . 0.9 %  sodium chloride infusion  1,000 mL Intravenous Continuous Flint Melter, MD 125 mL/hr at 12/12/11 0327 1,000 mL at 12/12/11 0327  . 0.9 %  sodium chloride infusion   Intravenous STAT Laray Anger, DO 100 mL/hr at 12/11/11 1610    . acetaminophen (TYLENOL) tablet 650 mg  650 mg Oral Q6H PRN Erick Blinks, MD       Or  . acetaminophen (TYLENOL) suppository 650 mg  650 mg Rectal Q6H PRN Erick Blinks, MD      . acetaminophen (TYLENOL) tablet 650 mg  650 mg Oral Q4H PRN Flint Melter, MD      . dextrose 5 % and 0.45% NaCl 1,000 mL with thiamine 100 mg, folic acid 1 mg, multivitamins adult 10 mL infusion   Intravenous Once Erick Blinks, MD        . enoxaparin (LOVENOX) injection 40 mg  40 mg Subcutaneous Q24H Erick Blinks, MD      . folic acid (FOLVITE) tablet 1 mg  1 mg Oral Daily Erick Blinks, MD   1 mg at 12/12/11 1023  . LORazepam (ATIVAN) tablet 1 mg  1 mg Oral Q6H PRN Erick Blinks, MD       Or  . LORazepam (ATIVAN) injection 1 mg  1 mg Intravenous Q6H PRN Erick Blinks, MD      . LORazepam (ATIVAN) tablet 0-4 mg  0-4 mg Oral Q6H Erick Blinks, MD   1 mg at 12/12/11 9604   Followed by  . LORazepam (ATIVAN) tablet 0-4 mg  0-4 mg Oral Q12H Erick Blinks, MD      . mulitivitamin with minerals tablet 1 tablet  1 tablet Oral Daily Erick Blinks, MD   1 tablet at 12/12/11 1022  . nicotine (NICODERM CQ - dosed in mg/24 hours) patch 21 mg  21 mg Transdermal Daily Vania Rea, MD   21 mg at 12/12/11 0225  . ondansetron (ZOFRAN) injection 4 mg  4 mg Intravenous Q8H PRN Laray Anger, DO      . ondansetron [facility]) tablet 4 mg  4 mg Oral Q6H PRN Erick Blinks, MD       Or  . ondansetron (ZOFRAN) injection 4 mg  4 mg Intravenous Q6H PRN Erick Blinks, MD      . oxyCODONE-acetaminophen (PERCOCET) 5-325 MG per tablet 1-2 tablet  1-2 tablet Oral Q4H PRN Erick Blinks, MD   2 tablet at 12/12/11 1049  . pantoprazole (PROTONIX) EC tablet 40 mg  40 mg Oral Q1200 Erick Blinks, MD   40 mg at 12/11/11 1834  . thiamine (VITAMIN B-1) tablet 100 mg  100 mg Oral Daily Erick Blinks, MD   100 mg at 12/12/11 1023   Or  . thiamine (B-1) injection 100 mg  100 mg Intravenous Daily Erick Blinks, MD      . ziprasidone (GEODON) 20 MG injection        10 mg at 12/11/11 0338  . ziprasidone (GEODON) injection 10 mg  10 mg Intramuscular Once Sunnie Nielsen, MD   10 mg at 12/11/11 0123  . DISCONTD: enoxaparin (LOVENOX) injection 30 mg  30 mg Subcutaneous Q24H Erick Blinks, MD   30 mg at 12/11/11 1834  . DISCONTD: ibuprofen (ADVIL,MOTRIN) tablet 600 mg  600 mg Oral Q8H  PRN Flint Melter, MD      . DISCONTD: nicotine (NICODERM CQ - dosed in  mg/24 hours) patch 21 mg  21 mg Transdermal Daily PRN Flint Melter, MD      . DISCONTD: ondansetron (ZOFRAN) tablet 4 mg  4 mg Oral Q8H PRN Flint Melter, MD      . DISCONTD: zolpidem (AMBIEN) tablet 5 mg  5 mg Oral QHS PRN Flint Melter, MD       Medications Prior to Admission  Medication Sig Dispense Refill  . baclofen (LIORESAL) 10 MG tablet Take 10 mg by mouth 3 (three) times daily.      Marland Kitchen omeprazole (PRILOSEC) 20 MG capsule Take 20 mg by mouth daily as needed. For heart burn        OB/GYN Status:  No LMP for male patient.  General Assessment Data Location of Assessment: [facility] (fpatient on medical floor-300) ACT Assessment: Yes Living Arrangements: Spouse/significant other;Children Can pt return to current living arrangement?: Yes Admission Status: Voluntary Is patient capable of signing voluntary admission?: Yes Transfer from: Acute Hospital Referral Source: Medical Floor Inpatient  Education Status Is patient currently in school?: No  Risk to self Suicidal Ideation: No Suicidal Intent: No Is patient at risk for suicide?: Yes (takes pain pills and drinks at the same time) Suicidal Plan?: No Specify Current Suicidal Plan: overdos Access to Means: No What has been your use of drugs/alcohol within the last 12 months?: drinks daily Previous Attempts/Gestures: No (has accidentially overdosed in the past) Other Self Harm Risks: drinks with his medications Triggers for Past Attempts: None known Intentional Self Injurious Behavior:  (drinks with his medications) Family Suicide History: No Recent stressful life event(s): Legal Issues;Recent negative physical changes Persecutory voices/beliefs?: No Depression: Yes Depression Symptoms: Insomnia;Fatigue;Loss of interest in usual pleasures Substance abuse history and/or treatment for substance abuse?: Yes Suicide prevention information given to non-admitted patients: Yes  Risk to Others Homicidal Ideation: No Thoughts of  Harm to Others: No Current Homicidal Intent: No Current Homicidal Plan: No Access to Homicidal Means: No History of harm to others?: No Assessment of Violence: None Noted Does patient have access to weapons?: No Criminal Charges Pending?: No Does patient have a court date: Yes Court Date: 12/20/11  Psychosis Hallucinations: None noted Delusions: None noted  Mental Status Report Appear/Hygiene: Disheveled Eye Contact: Good Motor Activity: Freedom of movement Speech: Logical/coherent Level of Consciousness: Alert Mood: Depressed Affect: Blunted;Depressed Anxiety Level: Minimal Thought Processes: Coherent;Relevant Judgement: Unimpaired Orientation: Person;Place;Time Obsessive Compulsive Thoughts/Behaviors: Minimal  Cognitive Functioning Concentration: Decreased Memory: Recent Intact;Remote Intact IQ: Average Insight: Fair Impulse Control: Poor Appetite: Good Sleep: No Change Vegetative Symptoms: None  Prior Inpatient Therapy Prior Inpatient Therapy: Yes Prior Therapy Dates: 1994 Prior Therapy Facilty/Provider(s): [facility] Reason for Treatment: alcohol abuse  Prior Outpatient Therapy Prior Outpatient Therapy: No  ADL Screening (condition at time of admission) Patient's cognitive ability adequate to safely complete daily activities?: Yes Patient able to express need for assistance with ADLs?: Yes Independently performs ADLs?: Yes Weakness of Legs: None Weakness of Arms/Hands: None  Home Assistive Devices/Equipment Home Assistive Devices/Equipment: None  Therapy Consults (therapy consults require a physician order) PT Evaluation Needed: No OT Evalulation Needed: No SLP Evaluation Needed: No Abuse/Neglect Assessment (Assessment to be complete while patient is alone) Physical Abuse: Denies Verbal Abuse: Denies Sexual Abuse: Denies Exploitation of patient/patient's resources: Denies Self-Neglect: Denies Values / Beliefs Cultural  Requests During Hospitalization: None Spiritual Requests During Hospitalization: None Consults  Spiritual Care Consult Needed: No Social Work Consult Needed: No Merchant navy officer (For Healthcare) Advance Directive: Patient does not have advance directive Pre-existing out of facility DNR order (yellow form or pink MOST form): No Nutrition Screen Diet: Regular Unintentional weight loss greater than 10lbs within the last month: No Problems chewing or swallowing foods and/or liquids: No Home Tube Feeding or Total Parenteral Nutrition (TPN): No Patient appears severely malnourished: No  Additional Information 1:1 In Past 12 Months?: No CIRT Risk: No Elopement Risk: No Does patient have medical clearance?: No     Disposition: Patient will remain on the medical floor until ready for discharge. He will be referred to [facility] and to the local pain clinic prior to discharge.  Disposition Disposition of Patient: Outpatient treatment;Referred to Other disposition(s):  (plans follow up with [facility];pain clinic) Patient referred to: Outpatient clinic referral  On Site Evaluation by:   Reviewed with Physician:     Jake Shark [facility] 12/12/2011 11:28 AM

## 2011-12-12 NOTE — Discharge Summary (Signed)
Physician Discharge Summary  Patient ID: [name] MRN: [id number] DOB/AGE: 10/09/1965 46 y.o.  Admit date: 12/10/2011 Discharge date: 12/12/2011  Primary Care Physician:  No primary provider on file.   Discharge Diagnoses:    Principal Problem:  *Overdose, accidental Active Problems:  Dehydration  Hypernatremia  Hepatitis C  Alcohol abuse  Chronic pain syndrome  Metabolic encephalopathy    Medication List  As of 12/12/2011  3:03 PM   STOP taking these medications         baclofen 10 MG tablet         TAKE these medications         LORazepam 1 MG tablet   Commonly known as: ATIVAN   Take 1 tablet (1 mg total) by mouth every 8 (eight) hours as needed for anxiety (tremors).      omeprazole 20 MG capsule   Commonly known as: PRILOSEC   Take 20 mg by mouth daily as needed. For heart burn      oxyCODONE-acetaminophen 5-325 MG per tablet   Commonly known as: PERCOCET   Take 1 tablet by mouth every 6 (six) hours as needed for pain.           Discharge Exam: Blood pressure 157/96, pulse 102, temperature 97.9 F (36.6 C), temperature source Oral, resp. rate 18, height 5\' 5"  (1.651 m), weight 63.5 kg (139 lb 15.9 oz), SpO2 99.00%. NAD CTA B S1, S2, RRR Soft, NT, BS+ No edema b/l No tremors, he is awake and orientedx3  Disposition and Follow-up:  He will be set up to see Dr. Gerilyn Pilgrim as an outpatient for pain management He will follow up with his alcohol rehab counselor for further management  Consults:  ACT team   Significant Diagnostic Studies:  Ct Head Wo Contrast  12/11/2011  *RADIOLOGY REPORT*  Clinical Data: Drug overdose.  Mental status changes.  CT HEAD WITHOUT CONTRAST  Technique:  Contiguous axial images were obtained from the base of the skull through the vertex without contrast.  Comparison: 10/17/2011.  Findings: Mild age advanced cerebral atrophy and ventriculomegaly. No extra-axial fluid collections are identified.  No CT findings for acute  hemispheric infarction and/or intracranial hemorrhage. No mass lesions.  The brainstem and cerebellum grossly normal and stable.  The bony structures are intact.  The paranasal sinuses and mastoid air cells are clear.  Globes are intact.  IMPRESSION: No acute intracranial findings or mass lesion. Mild age advanced cerebral atrophy.  Original Report Authenticated By: P. Loralie Champagne, M.D.   Dg Chest Port 1 View  12/10/2011  *RADIOLOGY REPORT*  Clinical Data: Drug overdose.  PORTABLE CHEST - 1 VIEW 12/10/2011:  Comparison: Portable chest x-ray 02/24/2005 [facility] and two-view chest x-ray 02/20/2005 [facility].  CTA chest [facility] 10/08/2009.  Findings: Suboptimal inspiration accounts for crowded bronchovascular markings, especially in the lung bases, and accentuates the cardiac silhouette.  Taking this into account, cardiomediastinal silhouette unremarkable and lungs clear.  IMPRESSION: Suboptimal inspiration.  No acute cardiopulmonary disease.  Original Report Authenticated By: Arnell Sieving, M.D.    Brief H and P: For complete details please refer to admission H and P, but in brief This is a 46 year old gentleman with history of chronic pain syndrome, hepatitis C, chronic myositis. Patient was brought to the emergency room today with altered mental status. He had reportedly taken extra doses of baclofen as well as consumed alcohol. On arrival he was lethargic and was unable to adequately participate in history.  He was monitored in the emergency room for an extensive period of time and when his mental status failed to return completely back to baseline, the hospitalists were called to evaluate for admission. At the time of my evaluation patient has become awake, alert he reports that he had taken a total of 16 tablets of baclofen. He had also consumed alcohol. He denies any suicidal ideation. He reports that he was simply trying to relieve his pain. Patient does have  chronic pain. Review of records show that he had a very similar presentation in January where he overdosed on baclofen at that time. He was also found to be hypernatremic and somewhat dehydrated. The patient will be admitted for overnight observation.     Hospital Course:  Principal Problem:  *Overdose Active Problems:  Dehydration  Hypernatremia  Hepatitis C  Alcohol abuse  Chronic pain syndrome  Metabolic encephalopathy  This gentleman was admitted to the hospital with an accidental overdose of baclofen.  Patient has chronic pain and reports being in significant pain requiring him to take extra doses of baclofen.  He also drinks alcohol regularly, and with baclofen and alcohol combination, he became altered, lethargic and was brought to the emergency room for evaluation.  He was admitted to the medical floor.  His mental status improved over time.  He has become awake alert and oriented.  He denies any suicidal ideations.  He was evaluated by ACT team and was not felt to be a threat to himself.  He was advised to follow up with a pain management specialist for management of his chronic pain due to chronic myositis.  He has been scheduled an appointment with Dr. Gerilyn Pilgrim. He has been given a limited supply of percocet.  He does have a history of alcohol abuse.  Currently he is not tremulous and mental status is intact.  He does have some hypertension and some mild tachycardia. He has been kept on CIWA protocol as he did report a history of withdrawal. Patient was offered to be monitored in the hospital for further signs of withdrawal, but he is very adamant about being discharged home today.  We will give him a limited prescription of ativan.  He reports he will be following up with his alcohol rehab counselor.    Time spent on Discharge:  Signed: Heraclio Seidman [facility] Pager: [phone] 12/12/2011, 3:03 PM

## 2011-12-12 NOTE — Progress Notes (Signed)
CARE MANAGEMENT NOTE 12/12/2011  Patient:  [name], [name]   Account Number:  [id number]  Date Initiated:  12/12/2011  Documentation initiated by:  Rosemary Holms  Subjective/Objective Assessment:   Pt admitted with possible drug overdose. PTA lived at home with his mother and his brother lives next door. No HH services     Action/Plan:   DC home.   Anticipated DC Date:  12/13/2011   Anticipated DC Plan:  HOME/SELF CARE      DC Planning Services  CM consult      Choice offered to / List presented to:             Status of service:  In process, will continue to follow Medicare Important Message given?   (If response is "NO", the following Medicare IM given date fields will be blank) Date Medicare IM given:   Date Additional Medicare IM given:    Discharge Disposition:    Per UR Regulation:    If discussed at Long Length of Stay Meetings, dates discussed:    Comments:  12/12/11 1430 Ronte Parker Leanord Hawking RN BSN

## 2011-12-12 NOTE — Discharge Instructions (Signed)
 Accidental Overdose  Sedgwick drug overdose occurs when Shankman chemical substance (drug or medication) is used in amounts large enough to overcome Petitti person. This may result in severe illness or death. This is Kuennen type of poisoning. Accidental overdoses of medications or other substances come from Georgi variety of reasons. When this happens accidentally, it is often because the person taking the substance does not know enough about what they have taken. Drugs which commonly cause overdose deaths are alcohol, psychotropic medications (medications which affect the mind), pain medications, illegal drugs (street drugs) such as cocaine and heroin, and multiple drugs taken at the same time. It may result from careless behavior (such as over-indulging at Dumm party). Other causes of overdose may include multiple drug use, Biven lapse in memory, or drug use after Ditter period of no drug use.   Sometimes overdosing occurs because Boquet person cannot remember if they have taken their medication.   Carneiro common unintentional overdose in young children involves multi-vitamins containing iron. Iron is Sill part of the hemoglobin molecule in blood. It is used to transport oxygen to living cells. When taken in small amounts, iron allows the body to restock hemoglobin. In large amounts, it causes problems in the body. If this overdose is not treated, it can lead to death.  Never take medicines that show signs of tampering or do not seem quite right. Never take medicines in the dark or in poor lighting. Read the label and check each dose of medicine before you take it. When adults are poisoned, it happens most often through carelessness or lack of information. Taking medicines in the dark or taking medicine prescribed for someone else to treat the same type of problem is Agramonte dangerous practice.  SYMPTOMS   Symptoms of overdose depend on the medication and amount taken. They can vary from over-activity with stimulant over-dosage, to sleepiness from depressants such as  alcohol, narcotics and tranquilizers. Confusion, dizziness, nausea and vomiting may be present. If problems are severe enough coma and death may result.  DIAGNOSIS   Diagnosis and management are generally straightforward if the drug is known. Otherwise it is more difficult. At times, certain symptoms and signs exhibited by the patient, or blood tests, can reveal the drug in question.   TREATMENT   In an emergency department, most patients can be treated with supportive measures. Antidotes may be available if there has been an overdose of opioids or benzodiazepines. Compere rapid improvement will often occur if this is the cause of overdose.  At home or away from medical care:   There may be no immediate problems or warning signs in children.   Not everything works well in all cases of poisoning.   Take immediate action. Poisons may act quickly.   If you think someone has swallowed medicine or Fruchter household product, and the person is unconscious, having seizures (convulsions), or is not breathing, immediately call for an ambulance.  IF Rouillard person is conscious and appears to be doing OK but has swallowed Rafter poison:   Do not wait to see what effect the poison will have. Immediately call Vorhees poison control center (listed in the white pages of your telephone book under "Poison Control" or inside the front cover with other emergency numbers). Some poison control centers have TTY capability for the deaf. Check with your local center if you or someone in your family requires this service.   Keep the container so you can read the label on the product for ingredients.  Describe what, when, and how much was taken and the age and condition of the person poisoned. Inform them if the person is vomiting, choking, drowsy, shows Suddreth change in color or temperature of skin, is conscious or unconscious, or is convulsing.   Do not cause vomiting unless instructed by medical personnel. Do not induce vomiting or force liquids into Tiede person who  is convulsing, unconscious, or very drowsy.  Stay calm and in control.    Activated charcoal also is sometimes used in certain types of poisoning and you may wish to add Greening supply to your emergency medicines. It is available without Scruggs prescription. Call Correia poison control center before using this medication.  PREVENTION   Thousands of children die every year from unintentional poisoning. This may be from household chemicals, poisoning from carbon monoxide in Ruybal car, taking their parent's medications, or simply taking Haran few iron pills or vitamins with iron. Poisoning comes from unexpected sources.   Store medicines out of the sight and reach of children, preferably in Lacroix locked cabinet. Do not keep medications in Kinnett food cabinet. Always store your medicines in Treloar secure place. Get rid of expired medications.   If you have children living with you or have them as occasional guests, you should have child-resistant caps on your medicine containers. Keep everything out of reach. Child proof your home.   If you are called to the telephone or to answer the door while you are taking Stavely medicine, take the container with you or put the medicine out of the reach of small children.   Do not take your medication in front of children. Do not tell your child how good Kossman medication is and how good it is for them. They may get the idea it is more of Violet treat.   If you are an adult and have accidentally taken an overdose, you need to consider how this happened and what can be done to prevent it from happening again. If this was from Sa[address] street drug or alcohol, determine if there is Blume problem that needs addressing. If you are not sure Makepeace problems exists, it is easy to talk to Crumble professional and ask them if they think you have Speagle problem. It is better to handle this problem in this way before it happens again and has Kopecky much worse consequence.  Document Released: 11/18/2004 Document Revised: 08/24/2011 Document Reviewed: 04/26/2009  [facility]  Patient Information 2012 [place], [place].

## 2011-12-12 NOTE — Progress Notes (Signed)
Patient d/c home  Left floor via wheelchair accompanied by staff No c/o pain  Verbalized understanding of d/c instructions, follow up appt, and RX's [name], [name]

## 2011-12-24 ENCOUNTER — Emergency Department (HOSPITAL_COMMUNITY)
Admission: EM | Admit: 2011-12-24 | Discharge: 2011-12-26 | Discharge status: Against Medical Advice | Payer: Self-pay | Attending: Emergency Medicine | Admitting: Emergency Medicine

## 2011-12-24 ENCOUNTER — Encounter (HOSPITAL_COMMUNITY): Payer: Self-pay

## 2011-12-24 DIAGNOSIS — F329 Major depressive disorder, single episode, unspecified: Secondary | ICD-10-CM | POA: Insufficient documentation

## 2011-12-24 DIAGNOSIS — F1010 Alcohol abuse, uncomplicated: Secondary | ICD-10-CM | POA: Insufficient documentation

## 2011-12-24 DIAGNOSIS — R197 Diarrhea, unspecified: Secondary | ICD-10-CM | POA: Insufficient documentation

## 2011-12-24 DIAGNOSIS — F3289 Other specified depressive episodes: Secondary | ICD-10-CM | POA: Insufficient documentation

## 2011-12-24 DIAGNOSIS — R1033 Periumbilical pain: Secondary | ICD-10-CM | POA: Insufficient documentation

## 2011-12-24 DIAGNOSIS — K7010 Alcoholic hepatitis without ascites: Secondary | ICD-10-CM | POA: Insufficient documentation

## 2011-12-24 DIAGNOSIS — E860 Dehydration: Secondary | ICD-10-CM | POA: Insufficient documentation

## 2011-12-24 DIAGNOSIS — Z8619 Personal history of other infectious and parasitic diseases: Secondary | ICD-10-CM | POA: Insufficient documentation

## 2011-12-24 DIAGNOSIS — R112 Nausea with vomiting, unspecified: Secondary | ICD-10-CM | POA: Insufficient documentation

## 2011-12-24 DIAGNOSIS — R1110 Vomiting, unspecified: Secondary | ICD-10-CM

## 2011-12-24 DIAGNOSIS — F1410 Cocaine abuse, uncomplicated: Secondary | ICD-10-CM | POA: Insufficient documentation

## 2011-12-24 DIAGNOSIS — G8929 Other chronic pain: Secondary | ICD-10-CM | POA: Insufficient documentation

## 2011-12-24 DIAGNOSIS — R12 Heartburn: Secondary | ICD-10-CM | POA: Insufficient documentation

## 2011-12-24 DIAGNOSIS — R259 Unspecified abnormal involuntary movements: Secondary | ICD-10-CM | POA: Insufficient documentation

## 2011-12-24 DIAGNOSIS — G894 Chronic pain syndrome: Secondary | ICD-10-CM | POA: Insufficient documentation

## 2011-12-24 DIAGNOSIS — K7460 Unspecified cirrhosis of liver: Secondary | ICD-10-CM | POA: Insufficient documentation

## 2011-12-24 LAB — CBC
HCT: 55.8 % — ABNORMAL HIGH (ref 39.0–52.0)
MCH: 32 pg (ref 26.0–34.0)
MCV: 87.5 fL (ref 78.0–100.0)
Platelets: 244 10*3/uL (ref 150–400)
RBC: 6.38 MIL/uL — ABNORMAL HIGH (ref 4.22–5.81)
WBC: 12.1 10*3/uL — ABNORMAL HIGH (ref 4.0–10.5)

## 2011-12-24 LAB — RAPID URINE DRUG SCREEN, HOSP PERFORMED
Cocaine: POSITIVE — AB
Opiates: NOT DETECTED
Tetrahydrocannabinol: NOT DETECTED

## 2011-12-24 LAB — COMPREHENSIVE METABOLIC PANEL
AST: 122 U/L — ABNORMAL HIGH (ref 0–37)
BUN: 4 mg/dL — ABNORMAL LOW (ref 6–23)
CO2: 27 mEq/L (ref 19–32)
Calcium: 9.6 mg/dL (ref 8.4–10.5)
Chloride: 86 mEq/L — ABNORMAL LOW (ref 96–112)
Creatinine, Ser: 0.7 mg/dL (ref 0.50–1.35)
GFR calc Af Amer: >90 mL/min (ref >90)
GFR calc non Af Amer: >90 mL/min (ref >90)
Glucose, Bld: 126 mg/dL — ABNORMAL HIGH (ref 70–99)
Total Bilirubin: 0.7 mg/dL (ref 0.3–1.2)

## 2011-12-24 LAB — URINALYSIS, ROUTINE W REFLEX MICROSCOPIC
Bilirubin Urine: NEGATIVE
Glucose, UA: NEGATIVE mg/dL
Specific Gravity, Urine: <1.005 — ABNORMAL LOW (ref 1.005–1.030)
Urobilinogen, UA: 0.2 mg/dL (ref 0.0–1.0)
pH: 6.5 (ref 5.0–8.0)

## 2011-12-24 LAB — ETHANOL: Alcohol, Ethyl (B): 140 mg/dL — ABNORMAL HIGH (ref 0–11)

## 2011-12-24 LAB — URINE MICROSCOPIC-ADD ON

## 2011-12-24 MED ORDER — SODIUM CHLORIDE 0.9 % IV BOLUS (SEPSIS)
1000.0000 mL | Freq: Once | INTRAVENOUS | Status: AC
  Administered 2011-12-25: 1000 mL via INTRAVENOUS

## 2011-12-24 MED ORDER — IBUPROFEN 400 MG PO TABS
600.0000 mg | ORAL_TABLET | Freq: Three times a day (TID) | ORAL | Status: DC | PRN
  Filled 2011-12-24: qty 1

## 2011-12-24 MED ORDER — ALUM & MAG HYDROXIDE-SIMETH 200-200-20 MG/5ML PO SUSP
30.0000 mL | ORAL | Status: DC | PRN
  Administered 2011-12-25: 30 mL via ORAL
  Filled 2011-12-24: qty 30

## 2011-12-24 MED ORDER — ZOLPIDEM TARTRATE 5 MG PO TABS
10.0000 mg | ORAL_TABLET | Freq: Every evening | ORAL | Status: DC | PRN
  Administered 2011-12-24: 10 mg via ORAL
  Filled 2011-12-24: qty 2

## 2011-12-24 MED ORDER — ACETAMINOPHEN 325 MG PO TABS
650.0000 mg | ORAL_TABLET | ORAL | Status: DC | PRN
  Filled 2011-12-24: qty 1
  Filled 2011-12-24: qty 2

## 2011-12-24 MED ORDER — SODIUM CHLORIDE 0.9 % IV BOLUS (SEPSIS)
2000.0000 mL | Freq: Once | INTRAVENOUS | Status: AC
  Administered 2011-12-24: 2000 mL via INTRAVENOUS

## 2011-12-24 MED ORDER — SODIUM CHLORIDE 0.9 % IV SOLN
INTRAVENOUS | Status: DC
  Administered 2011-12-24: 21:00:00 via INTRAVENOUS

## 2011-12-24 MED ORDER — NICOTINE 21 MG/24HR TD PT24
21.0000 mg | MEDICATED_PATCH | Freq: Every day | TRANSDERMAL | Status: DC
  Administered 2011-12-24 – 2011-12-26 (×2): 21 mg via TRANSDERMAL
  Filled 2011-12-24 (×4): qty 1

## 2011-12-24 MED ORDER — ONDANSETRON HCL 4 MG/2ML IJ SOLN
4.0000 mg | INTRAMUSCULAR | Status: DC | PRN
  Administered 2011-12-24 – 2011-12-25 (×2): 4 mg via INTRAVENOUS
  Filled 2011-12-24 (×2): qty 2

## 2011-12-24 MED ORDER — LORAZEPAM 1 MG PO TABS
1.0000 mg | ORAL_TABLET | Freq: Three times a day (TID) | ORAL | Status: DC | PRN
  Administered 2011-12-24 – 2011-12-26 (×5): 1 mg via ORAL
  Filled 2011-12-24 (×5): qty 1

## 2011-12-24 MED ORDER — ONDANSETRON HCL 4 MG PO TABS: 4.0000 mg | ORAL_TABLET | Freq: Three times a day (TID) | ORAL | Status: DC | PRN

## 2011-12-24 NOTE — BH Assessment (Signed)
Assessment Note   [name] is an 46 y.o. male. He was recently a patient on the floor at [facility] about one week ago. Patient is now here voluntarily asking for a detox. States she has been using 12, 12oz beers daily. He has also used $600 worth of Crack Cocaine since December 19, 2011. Patient reports that he had a fleeting SI thought, but no thoughts now and has not ever had a plan or intent. Denies HI. Denies other drugs. No Delusions or Hallucinations noted. Patient is cooperative. He has had many negative consequences from his substance abuse. He states he does feel down sometimes. Reports his appetite has decreased and he has not eaten since April 2. States that he attempts to sleep, but will only sleep about 30 min at a time, wakes up, then it takes him awhile to fall asleep. Patient contracts for safety.  Axis I: Alcohol Abuse, Cocaine Abuse, Substance induced mood disorder Axis II. Deferred Axis III: Hepatitis C, Myositis, chronic pain Axis IV: Financial and relationship stressors Axis V: GAF 55   Past Medical History:  Past Medical History  Diagnosis Date  . Hepatitis C   . Myositis     Left deltoid biopsy at [facility] 10/11/11.  Marland Kitchen Cirrhosis   . Chronic pain syndrome   . 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 Alcohol abuse     Psychiatric admissions for alcohol and drug abuse  . History of cocaine abuse   . Depression     Multiple psychiatric admissions  . Tobacco abuse   . Tattoos   . Low TSH level January 2013    Normal free T4 of 1.06    Past Surgical History  Procedure Date  . Hernia repair   . Muscle biopsy   . Cardiac catheterization June 2006    Normal coronary arteries    Family History: No family history on file.  Social History:  reports that he has been smoking.  He does not have any smokeless tobacco history on file. He reports that he drinks about 58.8 ounces of alcohol per week. He reports that he uses illicit drugs (Cocaine).  Additional Social History:      Allergies:  Allergies  Allergen Reactions  . Acetaminophen Other (See Comments)    Liver disease     Home Medications:  Medications Prior to Admission  Medication Dose Route Frequency Provider Last Rate Last Dose  . 0.9 %  sodium chloride infusion   Intravenous Continuous Ward Givens, MD      . acetaminophen (TYLENOL) tablet 650 mg  650 mg Oral Q4H PRN Ward Givens, MD      . alum & mag hydroxide-simeth (MAALOX/MYLANTA) 200-200-20 MG/5ML suspension 30 mL  30 mL Oral PRN Ward Givens, MD      . ibuprofen (ADVIL,MOTRIN) tablet 600 mg  600 mg Oral Q8H PRN Ward Givens, MD      . LORazepam (ATIVAN) tablet 1 mg  1 mg Oral Q8H PRN Ward Givens, MD   1 mg at 12/24/11 1936  . nicotine (NICODERM CQ - dosed in mg/24 hours) patch 21 mg  21 mg Transdermal Daily Ward Givens, MD   21 mg at 12/24/11 1930  . ondansetron (ZOFRAN) injection 4 mg  4 mg Intravenous Q20 Min PRN Ward Givens, MD      . ondansetron (ZOFRAN) tablet 4 mg  4 mg Oral Q8H PRN Ward Givens, MD      . sodium  chloride 0.9 % bolus 2,000 mL  2,000 mL Intravenous Once Ward Givens, MD   2,000 mL at 12/24/11 1930  . zolpidem (AMBIEN) tablet 10 mg  10 mg Oral QHS PRN Ward Givens, MD       Medications Prior to Admission  Medication Sig Dispense Refill  . omeprazole (PRILOSEC) 20 MG capsule Take 20 mg by mouth daily as needed. For heart burn        OB/GYN Status:  No LMP for male patient.  General Assessment Data Location of Assessment: [facility] ACT Assessment: Yes Living Arrangements: Alone Can pt return to current living arrangement?: Yes Admission Status: Voluntary Is patient capable of signing voluntary admission?: Yes Transfer from: Acute Hospital Referral Source: MD  Education Status Is patient currently in school?: No  Risk to self Suicidal Ideation: No (had a passing thought, he states he is not now) Suicidal Intent: No Is patient at risk for suicide?: No Suicidal Plan?: No Access to Means: No What has been your use of  drugs/alcohol within the last 12 months?: chronic etoh use, drug abuse Previous Attempts/Gestures: No Other Self Harm Risks: etoh with meds Triggers for Past Attempts: None known Intentional Self Injurious Behavior: None Family Suicide History: No Recent stressful life event(s): Financial Problems;Recent negative physical changes;Other (Comment) (separated from wife) Persecutory voices/beliefs?: No Depression: Yes (feels down) Depression Symptoms: Insomnia;Isolating;Loss of interest in usual pleasures Substance abuse history and/or treatment for substance abuse?: Yes Suicide prevention information given to non-admitted patients: Yes  Risk to Others Homicidal Ideation: No Thoughts of Harm to Others: No Current Homicidal Intent: No Current Homicidal Plan: No Access to Homicidal Means: No History of harm to others?: No Assessment of Violence: None Noted Violent Behavior Description:  (cooperative and pleasant) Does patient have access to weapons?: No Criminal Charges Pending?: No Does patient have a court date: No  Psychosis Hallucinations: None noted Delusions: None noted  Mental Status Report Appear/Hygiene: Disheveled Eye Contact: Fair Motor Activity: Restlessness;Tremors;Unsteady Speech: Logical/coherent;Soft Level of Consciousness: Alert Mood: Anxious Affect: Blunted;Appropriate to circumstance Anxiety Level: Minimal Thought Processes: Coherent Judgement: Unimpaired Orientation: Person;Place;Time;Situation Obsessive Compulsive Thoughts/Behaviors: Minimal  Cognitive Functioning Concentration: Decreased Memory: Remote Intact;Recent Intact IQ: Average Insight: Poor Impulse Control: Fair Appetite: Fair Sleep: Decreased Total Hours of Sleep: 2  Vegetative Symptoms: None  Prior Inpatient Therapy Prior Inpatient Therapy: Yes Prior Therapy Dates: 1999 Prior Therapy Facilty/Provider(s): [facility], [facility], [facility] Reason for Treatment: etoh detox  Prior  Outpatient Therapy Prior Outpatient Therapy: No            Values / Beliefs Cultural Requests During Hospitalization: None Spiritual Requests During Hospitalization: None        Additional Information 1:1 In Past 12 Months?: No CIRT Risk: No Elopement Risk: No Does patient have medical clearance?: Yes     Disposition:  Disposition Disposition of Patient: Inpatient treatment program (patient desires detox) Type of inpatient treatment program: Adult Other disposition(s): Referred to outside facility Patient referred to: [facility]  On Site Evaluation by:  Dr. Lynelle Doctor Reviewed with Physician:  Dr. Lynelle Doctor  Patient will be referred to [facility] and [facility]. Patient already referred to [facility] and placed on waiting list.  Mardene Celeste 12/24/2011 7:37 PM

## 2011-12-24 NOTE — ED Notes (Signed)
Patient was given ice water per his request.

## 2011-12-24 NOTE — ED Notes (Signed)
Pt reports has addiction to  etoh and cocaine.  Reports has had thoughts of suicide but denies plan.  Also denies HI.  Reports has been drinking and doing cocaine heavily for the past 5 days.  Pt says wants help because he has spent approx $700.00  Since 4/2 on alcohol and cocaine.

## 2011-12-24 NOTE — ED Notes (Signed)
Pt also states last used cocaine yesterday afternoon but has been drinking beer  all day and vomiting x 2 days.

## 2011-12-24 NOTE — ED Notes (Signed)
Pt is actively vomiting and EDP was notified.

## 2011-12-24 NOTE — ED Provider Notes (Addendum)
History   Scribed for No att. providers found, the patient was seen in [facility]16A/[facility]A. The chart was scribed by Gilman Schmidt. The patients care was started at 10:00 AM.   CSN: [id number]  Arrival date & time 12/24/11  1649   First MD Initiated Contact with Patient 12/24/11 1759      Chief Complaint  Patient presents with  . V70.1    (Consider location/radiation/quality/duration/timing/severity/associated sxs/prior treatment) HPI [name] is a 46 y.o. male who presents to the Emergency Department complaining of wanting detox. Pt reports addiction to etoh and cocaine. Reports has been drinking and doing cocaine heavily for the past 5 days due to pain. Pt says wants help because he knows it's not good for his children. States even though he uses cocaine it isn't a problem even though he has spent approx $500.00 since 4/2 on alcohol and cocaine. Notes last drink of alcohol was one hour PTA. Pt states he was admitted one week ago for muscle disease in legs and is scheduled for pain management. Pt states he wants to go to treatment facility. Pt notes disability due to myositis.  Also notes chronic heartburn. Pt describes diarrhea about 4-5 times today and vomiting "about 50" times today.  He "kinda" has pain around his umbilicus.  Denies any thoughts of harm to self or other, or depression. 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   PCP none   Past Medical History  Diagnosis Date  . Hepatitis C   . Myositis     Left deltoid biopsy at [facility] 10/11/11.  Marland Kitchen Cirrhosis   . Chronic pain syndrome   . 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 Alcohol abuse     Psychiatric admissions for alcohol and drug abuse  . History of cocaine abuse   . Depression     Multiple psychiatric admissions  . Tobacco abuse   .  Tattoos   . Low TSH level January 2013    Normal free T4 of 1.06    Past Surgical History  Procedure Date  . Hernia repair   . Muscle biopsy   . Cardiac catheterization June 2006    Normal coronary arteries    No family history on file.  History  Substance Use Topics  . Smoking status: Current Everyday Smoker -- 1.0 packs/day  . Smokeless tobacco: Not on file  . Alcohol Use: 58.8 oz/week    48 Cans of beer, 50 Shots of liquor per week     Pt drinks heavily   disability   Review of Systems  Gastrointestinal: Positive for vomiting and abdominal pain.  Neurological: Positive for tremors.  Psychiatric/Behavioral: Negative for suicidal ideas and self-injury.       Substance abuse   All other systems reviewed and are negative.    Allergies  Acetaminophen  Home Medications   Current Outpatient Rx  Name Route Sig Dispense Refill  . OMEPRAZOLE 20 MG PO CPDR Oral Take 20 mg by mouth daily as needed. For heart burn     Patient states that Lyrica made him feel dizzy, he states the baclofen made him feel flushed.    BP 141/105  Pulse 135  Temp(Src) 98.8 F (37.1 C) (Oral)  Resp 18  Ht 5\' 8"  (1.727 m)  Wt 140 lb (63.504  kg)  BMI 21.29 kg/m2  SpO2 100%  Vital signs normal except tachycardia, hypertension   Physical Exam  Constitutional: He is oriented to person, place, and time. He appears well-developed and well-nourished.       Tremors   HENT:  Head: Normocephalic and atraumatic.  Right Ear: External ear normal.  Left Ear: External ear normal.  Nose: Nose normal.       Dry tongue, slight tremor  Eyes: Conjunctivae are normal. Pupils are equal, round, and reactive to light.  Neck: Normal range of motion. Neck supple. No tracheal deviation present. No thyromegaly present.  Cardiovascular: Normal rate, regular rhythm and normal heart sounds.   No murmur heard. Pulmonary/Chest: Effort normal and breath sounds normal.  Abdominal: Soft. Bowel sounds are normal.  He exhibits no distension. There is tenderness. There is no rebound and no guarding.       Mild upper tenderness  Musculoskeletal: Normal range of motion. He exhibits no edema and no tenderness.       No focal tenderness of his legs  Neurological: He is alert and oriented to person, place, and time. Coordination normal.  Skin: Skin is warm and dry. No rash noted.  Psychiatric:       Flat affect    ED Course  Procedures (including critical care time)  Pt has been evaluted by ACT. His information has been sent to [facility].   Reviewed the [place] controlled substances report website shows patient has only had 3 prescriptions written in the past 6 months for narcotics, he received 35 oxycodone 5 mg tablets on January 22, 20 oxycodone 5/325 on March 26, and 20 hydrocodone 5/500 on January 25. Also reviewing his notes from [facility] they questioned his need for narcotics and were not prescribing them. So even though patient states he is on chronic narcotics, I cannot verify it.   Pt given IV fluids for his dehydration and IV zofran for his nausea. Psych holding orders intiated.   Pt turned over to Dr Dierdre Highman to check a BMET at 4 am to see if his electrolyte abnormalities have improved.PT not started on narcotic pain medications since he is requesting to go to a detox facility.    LABS Results for orders placed during the hospital encounter of 12/24/11  CBC      Component Value Range   WBC 12.1 (*) 4.0 - 10.5 (K/uL)   RBC 6.38 (*) 4.22 - 5.81 (MIL/uL)   Hemoglobin 20.4 (*) 13.0 - 17.0 (g/dL)   HCT 51.0 (*) 25.8 - 52.0 (%)   MCV 87.5  78.0 - 100.0 (fL)   MCH 32.0  26.0 - 34.0 (pg)   MCHC 36.6 (*) 30.0 - 36.0 (g/dL)   RDW 52.7  78.2 - 42.3 (%)   Platelets 244  150 - 400 (K/uL)  COMPREHENSIVE METABOLIC PANEL      Component Value Range   Sodium 131 (*) 135 - 145 (mEq/L)   Potassium 3.6  3.5 - 5.1 (mEq/L)   Chloride 86 (*) 96 - 112 (mEq/L)   CO2 27  19 - 32 (mEq/L)   Glucose, Bld 126  (*) 70 - 99 (mg/dL)   BUN 4 (*) 6 - 23 (mg/dL)   Creatinine, Ser 5.36  0.50 - 1.35 (mg/dL)   Calcium 9.6  8.4 - 14.4 (mg/dL)   Total Protein 8.3  6.0 - 8.3 (g/dL)   Albumin 4.0  3.5 - 5.2 (g/dL)   AST 315 (*) 0 - 37 (U/L)   ALT  55 (*) 0 - 53 (U/L)   Alkaline Phosphatase 105  39 - 117 (U/L)   Total Bilirubin 0.7  0.3 - 1.2 (mg/dL)   GFR calc non Af Amer >90  >90 (mL/min)   GFR calc Af Amer >90  >90 (mL/min)  ETHANOL      Component Value Range   Alcohol, Ethyl (B) 140 (*) 0 - 11 (mg/dL)  URINE RAPID DRUG SCREEN (HOSP PERFORMED)      Component Value Range   Opiates NONE DETECTED  NONE DETECTED    Cocaine POSITIVE (*) NONE DETECTED    Benzodiazepines NONE DETECTED  NONE DETECTED    Amphetamines NONE DETECTED  NONE DETECTED    Tetrahydrocannabinol NONE DETECTED  NONE DETECTED    Barbiturates NONE DETECTED  NONE DETECTED   URINALYSIS, ROUTINE W REFLEX MICROSCOPIC      Component Value Range   Color, Urine YELLOW  YELLOW    APPearance CLEAR  CLEAR    Specific Gravity, Urine <1.005 (*) 1.005 - 1.030    pH 6.5  5.0 - 8.0    Glucose, UA NEGATIVE  NEGATIVE (mg/dL)   Hgb urine dipstick TRACE (*) NEGATIVE    Bilirubin Urine NEGATIVE  NEGATIVE    Ketones, ur TRACE (*) NEGATIVE (mg/dL)   Protein, ur NEGATIVE  NEGATIVE (mg/dL)   Urobilinogen, UA 0.2  0.0 - 1.0 (mg/dL)   Nitrite NEGATIVE  NEGATIVE    Leukocytes, UA NEGATIVE  NEGATIVE   URINE MICROSCOPIC-ADD ON      Component Value Range   Squamous Epithelial / LPF MANY (*) RARE    WBC, UA 7-10  <3 (WBC/hpf)   RBC / HPF 7-10  <3 (RBC/hpf)   Bacteria, UA FEW (*) RARE    Urine-Other YEAST    LIPASE, BLOOD      Component Value Range   Lipase 47  11 - 59 (U/L)  BASIC METABOLIC PANEL      Component Value Range   Sodium 135  135 - 145 (mEq/L)   Potassium 3.5  3.5 - 5.1 (mEq/L)   Chloride 97  96 - 112 (mEq/L)   CO2 29  19 - 32 (mEq/L)   BUN 4 (*) 6 - 23 (mg/dL)   Creatinine, Ser 1.61  0.50 - 1.35 (mg/dL)   Calcium 7.5 (*) 8.4 - 10.5  (mg/dL)   GFR calc non Af Amer >90  >90 (mL/min)   GFR calc Af Amer >90  >90 (mL/min)   Laboratory interpretation all normal except +drug screen, concentrated Hb c/w dehydration, contaminated urine, intoxicated, elevated lft's   DIAGNOSTIC STUDIES: Oxygen Saturation is 100% on room air, normal by my interpretation.    COORDINATION OF CARE: 6:52pm:  - Patient evaluated by ED physician, Nicotine, Ativan, Tylenol, Advil, Ambien, Zofran, Mylanta, Zofran ordered  Prior chart reviewed. Patient was admitted last week for overdose on baclofen with alcohol.      1. Alcohol abuse   2. Vomiting and diarrhea   3. Dehydration   4. Alcoholic hepatitis   5. Cocaine abuse   6. Chronic pain     Plan Discharge to Rehab/detox facility.  Pt is voluntary, he has no SI or HI.   Devoria Albe, MD, FACEP   MDM    I personally performed the services described in this documentation, which was scribed in my presence. The recorded information has been reviewed and considered. Devoria Albe, MD, FACEP       Ward Givens, MD 12/25/11 1005  Patient care assumed  from Dr. Lynelle Doctor. Patient resting comfortably overnight and tolerating fluids by mouth. Recheck metabolic panel in the morning improved sodium chloride. Still has low BUN. Clinical exam appears hydrated without hypotension and tachycardia improving. No clinical DTs. Pending [facility].   Sunnie Nielsen, MD 12/25/11 2300

## 2011-12-25 LAB — BASIC METABOLIC PANEL
BUN: 4 mg/dL — ABNORMAL LOW (ref 6–23)
CO2: 29 mEq/L (ref 19–32)
Calcium: 7.5 mg/dL — ABNORMAL LOW (ref 8.4–10.5)
Chloride: 97 mEq/L (ref 96–112)
Creatinine, Ser: 0.7 mg/dL (ref 0.50–1.35)

## 2011-12-25 MED ORDER — HYDROXYZINE HCL 25 MG PO TABS: 25.0000 mg | ORAL_TABLET | Freq: Four times a day (QID) | ORAL | Status: DC | PRN

## 2011-12-25 MED ORDER — LOPERAMIDE HCL 2 MG PO CAPS: 2.0000 mg | ORAL_CAPSULE | ORAL | Status: DC | PRN

## 2011-12-25 MED ORDER — METHOCARBAMOL 500 MG PO TABS: 500.0000 mg | ORAL_TABLET | Freq: Three times a day (TID) | ORAL | Status: DC | PRN

## 2011-12-25 MED ORDER — SODIUM CHLORIDE 0.9 % IV BOLUS (SEPSIS)
1000.0000 mL | Freq: Once | INTRAVENOUS | Status: AC
  Administered 2011-12-25: 1000 mL via INTRAVENOUS

## 2011-12-25 MED ORDER — NAPROXEN 250 MG PO TABS
500.0000 mg | ORAL_TABLET | Freq: Two times a day (BID) | ORAL | Status: DC | PRN
  Administered 2011-12-26: 500 mg via ORAL
  Filled 2011-12-25: qty 2

## 2011-12-25 MED ORDER — ONDANSETRON 4 MG PO TBDP: 4.0000 mg | ORAL_TABLET | Freq: Four times a day (QID) | ORAL | Status: DC | PRN

## 2011-12-25 MED ORDER — DICYCLOMINE HCL 10 MG PO CAPS: 20.0000 mg | ORAL_CAPSULE | ORAL | Status: DC | PRN

## 2011-12-25 NOTE — ED Notes (Signed)
Sitter remains at bedside, Charity fundraiser

## 2011-12-25 NOTE — ED Notes (Signed)
Pt offered Tylenol and Ibuprofen per MD PRN order, pt refused again. Pt stated "that stuff is like water to me".

## 2011-12-25 NOTE — ED Notes (Signed)
No adverse reaction to sivp zofran, nausea relieved, still co pain and anxiety 8/10. Instructed pt his next dose of lorazepam can be given at 1100. Pt refused Tylenol, Advil for pain.

## 2011-12-25 NOTE — Progress Notes (Signed)
Assessment Note   [name] is an 46 y.o. male.   Axis I: Alcohol Abuse;Cocaine Abuse;Substance induced mood disorder Axis II: Deferred Axis III:  Past Medical History  Diagnosis Date  . Hepatitis C   . Myositis     Left deltoid biopsy at [facility] 10/11/11.  Marland Kitchen Cirrhosis   . Chronic pain syndrome   . 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 Alcohol abuse     Psychiatric admissions for alcohol and drug abuse  . History of cocaine abuse   . Depression     Multiple psychiatric admissions  . Tobacco abuse   . Tattoos   . Low TSH level January 2013    Normal free T4 of 1.06   Axis IV: economic problems and problems with primary support group Axis V: 51-60 moderate symptoms  Past Medical History:  Past Medical History  Diagnosis Date  . Hepatitis C   . Myositis     Left deltoid biopsy at [facility] 10/11/11.  Marland Kitchen Cirrhosis   . Chronic pain syndrome   . 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 Alcohol abuse     Psychiatric admissions for alcohol and drug abuse  . History of cocaine abuse   . Depression     Multiple psychiatric admissions  . Tobacco abuse   . Tattoos   . Low TSH level January 2013    Normal free T4 of 1.06    Past Surgical History  Procedure Date  . Hernia repair   . Muscle biopsy   . Cardiac catheterization June 2006    Normal coronary arteries    Family History: No family history on file.  Social History:  reports that he has been smoking.  He does not have any smokeless tobacco history on file. He reports that he drinks about 58.8 ounces of alcohol per week. He reports that he uses illicit drugs (Cocaine).  Additional Social History:  Alcohol / Drug Use History of alcohol / drug use?: Yes Longest period of sobriety (when/how long): none reported Negative Consequences of Use: Financial;Legal;Personal relationships;Work / School Withdrawal Symptoms: Agitation;Change in blood pressure;Blackouts;Patient aware of relationship between substance abuse and physical/medical  complications;Nausea / Vomiting;Irritability;Sweats;Tremors;Weakness Allergies:  Allergies  Allergen Reactions  . Acetaminophen Other (See Comments)    Liver disease     Home Medications:  Medications Prior to Admission  Medication Dose Route Frequency Provider Last Rate Last Dose  . 0.9 %  sodium chloride infusion   Intravenous Continuous Ward Givens, MD 125 mL/hr at 12/24/11 2113    . acetaminophen (TYLENOL) tablet 650 mg  650 mg Oral Q4H PRN Ward Givens, MD      . alum & mag hydroxide-simeth (MAALOX/MYLANTA) 200-200-20 MG/5ML suspension 30 mL  30 mL Oral PRN Ward Givens, MD      . ibuprofen (ADVIL,MOTRIN) tablet 600 mg  600 mg Oral Q8H PRN Ward Givens, MD      . LORazepam (ATIVAN) tablet 1 mg  1 mg Oral Q8H PRN Ward Givens, MD   1 mg at 12/25/11 0318  . nicotine (NICODERM CQ - dosed in mg/24 hours) patch 21 mg  21 mg Transdermal Daily Ward Givens, MD   21 mg at 12/24/11 1930  . ondansetron (ZOFRAN) injection 4 mg  4 mg Intravenous Q20 Min PRN Ward Givens, MD   4 mg at 12/25/11 0800  . ondansetron (ZOFRAN) tablet 4 mg  4 mg Oral Q8H PRN Ward Givens, MD      .  sodium chloride 0.9 % bolus 1,000 mL  1,000 mL Intravenous Once Ward Givens, MD   1,000 mL at 12/25/11 0054  . sodium chloride 0.9 % bolus 2,000 mL  2,000 mL Intravenous Once Ward Givens, MD   2,000 mL at 12/24/11 1930  . zolpidem (AMBIEN) tablet 10 mg  10 mg Oral QHS PRN Ward Givens, MD   10 mg at 12/24/11 2247   Medications Prior to Admission  Medication Sig Dispense Refill  . omeprazole (PRILOSEC) 20 MG capsule Take 20 mg by mouth daily as needed. For heart burn        OB/GYN Status:  No LMP for male patient.  General Assessment Data Location of Assessment: [facility] ACT Assessment: Yes Living Arrangements: Alone Can pt return to current living arrangement?: Yes Admission Status: Voluntary Is patient capable of signing voluntary admission?: Yes Transfer from: Acute Hospital Referral Source: MD  Education Status Is  patient currently in school?: No  Risk to self Suicidal Ideation: No (had a passing thought, he states he is not now) Suicidal Intent: No Is patient at risk for suicide?: No Suicidal Plan?: No Access to Means: No What has been your use of drugs/alcohol within the last 12 months?: chronic etoh use, drug abuse Previous Attempts/Gestures: No Other Self Harm Risks: etoh with meds Triggers for Past Attempts: None known Intentional Self Injurious Behavior: None Family Suicide History: No Recent stressful life event(s): Financial Problems;Recent negative physical changes;Other (Comment) (separated from wife) Persecutory voices/beliefs?: No Depression: Yes (feels down) Depression Symptoms: Insomnia;Isolating;Loss of interest in usual pleasures Substance abuse history and/or treatment for substance abuse?: Yes Suicide prevention information given to non-admitted patients: Yes  Risk to Others Homicidal Ideation: No Thoughts of Harm to Others: No Current Homicidal Intent: No Current Homicidal Plan: No Access to Homicidal Means: No History of harm to others?: No Assessment of Violence: None Noted Violent Behavior Description:  (cooperative and pleasant) Does patient have access to weapons?: No Criminal Charges Pending?: No Does patient have a court date: No  Psychosis Hallucinations: None noted Delusions: None noted  Mental Status Report Appear/Hygiene: Disheveled Eye Contact: Fair Motor Activity: Restlessness;Tremors;Unsteady Speech: Logical/coherent;Soft Level of Consciousness: Alert Mood: Anxious Affect: Blunted;Appropriate to circumstance Anxiety Level: Minimal Thought Processes: Coherent Judgement: Unimpaired Orientation: Person;Place;Time;Situation Obsessive Compulsive Thoughts/Behaviors: Minimal  Cognitive Functioning Concentration: Decreased Memory: Remote Intact;Recent Intact IQ: Average Insight: Poor Impulse Control: Fair Appetite: Fair Sleep: Decreased Total  Hours of Sleep: 2  Vegetative Symptoms: None  Prior Inpatient Therapy Prior Inpatient Therapy: Yes Prior Therapy Dates: 1999 Prior Therapy Facilty/Provider(s): [facility], [facility] Reason for Treatment: etoh detox  Prior Outpatient Therapy Prior Outpatient Therapy: No            Values / Beliefs Cultural Requests During Hospitalization: None Spiritual Requests During Hospitalization: None        Additional Information 1:1 In Past 12 Months?: No CIRT Risk: No Elopement Risk: No Does patient have medical clearance?: Yes     Disposition:  Disposition Disposition of Patient: Inpatient treatment program (patient desires detox) Type of inpatient treatment program: Adult Other disposition(s): Referred to outside facility Patient referred to: [facility]  On Site Evaluation by:   Reviewed with Physician:   Spoke with Toniann Fail at [facility]. Mr. Harari has been declined as too acute for their facility. Suicidal ideation was the reason given.  Jake Shark [facility] 12/25/2011 9:20 AM

## 2011-12-25 NOTE — ED Notes (Signed)
Patient asked and given sprite to drink.

## 2011-12-25 NOTE — ED Provider Notes (Signed)
Patient was initially seen by me last night when he presented for detox from alcohol and cocaine. He was having a lot of vomiting and he had electrolyte abnormalities from his dehydration. A repeat beam it done during the night had showed improvement of those abnormalities. Patient states he's feeling "about the same". Nursing reports he has persistent tachycardia that has concerned behavioral health for looking at his paperwork for possible admission. Patient given another liter of fluid, although patient stated cocaine was on a big problem for him we'll start the clonidine withdrawal protocol to see if that will help with some of his withdrawal symptoms.  Devoria Albe, MD, [facility]   Ward Givens, MD 12/25/11 2136

## 2011-12-25 NOTE — ED Notes (Signed)
Pt asking for something to help him sleep, something for pain in his legs but not tylenol or ibuprofen. Will ask MD.

## 2011-12-26 MED ORDER — NAPROXEN 250 MG PO TABS: 500.0000 mg | ORAL_TABLET | Freq: Two times a day (BID) | ORAL | Status: DC | PRN

## 2011-12-26 MED ORDER — METHOCARBAMOL 500 MG PO TABS: 500.0000 mg | ORAL_TABLET | Freq: Three times a day (TID) | ORAL | Status: DC | PRN

## 2011-12-26 MED ORDER — LORAZEPAM 1 MG PO TABS
ORAL_TABLET | ORAL | Status: AC
  Filled 2011-12-26: qty 1

## 2011-12-26 MED ORDER — HYDROXYZINE HCL 25 MG PO TABS: 25.0000 mg | ORAL_TABLET | Freq: Four times a day (QID) | ORAL | Status: DC | PRN

## 2011-12-26 MED ORDER — CLONIDINE HCL 0.1 MG PO TABS: 0.1000 mg | ORAL_TABLET | ORAL | Status: DC

## 2011-12-26 MED ORDER — ONDANSETRON 4 MG PO TBDP: 4.0000 mg | ORAL_TABLET | Freq: Four times a day (QID) | ORAL | Status: DC | PRN

## 2011-12-26 MED ORDER — LORAZEPAM 1 MG PO TABS
1.0000 mg | ORAL_TABLET | ORAL | Status: DC | PRN
  Administered 2011-12-26: 1 mg via ORAL

## 2011-12-26 MED ORDER — CLONIDINE HCL 0.1 MG PO TABS
0.1000 mg | ORAL_TABLET | Freq: Every day | ORAL | Status: DC
  Administered 2011-12-26: 0.1 mg via ORAL

## 2011-12-26 MED ORDER — CLONIDINE HCL 0.1 MG PO TABS
0.1000 mg | ORAL_TABLET | Freq: Four times a day (QID) | ORAL | Status: DC
  Filled 2011-12-26: qty 1

## 2011-12-26 MED ORDER — CLONIDINE HCL 0.2 MG PO TABS: 0.2000 mg | ORAL_TABLET | Freq: Two times a day (BID) | ORAL | Status: DC

## 2011-12-26 NOTE — ED Notes (Signed)
Dr Ignacia Palma aware pt still asking for something for pain. States will go in to talk with him. Pt states if he cant get anything for pain he will just leave. edp aware

## 2011-12-26 NOTE — Discharge Instructions (Signed)
[name], you have problems with alcoholism that should receive medical treatment, and you also have high blood pressure.  Despite this, you are signing out against medical advice.  Dr. Ignacia Palma has prescribed you the medicine clonidine 0.2 mg twice a day, to help with symptoms of withdrawal and to treat high blood pressure.  You can return at any time for treatment of your medical problems.

## 2011-12-26 NOTE — Progress Notes (Signed)
Case discussed with Frances Maywood, Behavioral Health counselor.  He said that pt would have a bed at [facility] today.  I advised pt of this, and also had had him take clonidine to try to blunt narcotic withdrawal symptoms and lower his blood pressure.  He insists on signing out AMA.  I will prescribe him enough clonidine for 2 weeks, advise him to seek followup for his alcoholism and hypertension.

## 2011-12-26 NOTE — ED Notes (Signed)
Pt called to advised did not receive d/c papers and rx. No answer and unable to leave message. Will call back later

## 2011-12-26 NOTE — Progress Notes (Signed)
Pt says that he wants pain medicine for his chronic back pain.  Review of his [place] controlled substance prescription report shows he gets occasional prescriptions for oxycodone-acetaminophen, hydrocodone-acetaminophen, lorazepam, and lyrica from several different providers.  I advised him that he would not be receiving a narcotic if he goes to a detox facility, to which he replied that if he did not get something he would leave and go back to drinking.  Will call Behavioral Health counselor to discuss situation.

## 2011-12-26 NOTE — ED Notes (Signed)
Called pt several times throughout day to notify of papers and rx with no answer and unable to leave message. Will notify oncoming charge nurse.

## 2011-12-26 NOTE — ED Notes (Signed)
Dr Ignacia Palma in now with pt

## 2011-12-26 NOTE — ED Notes (Signed)
Pt ambulated to rest room with minimal assistance. [name]

## 2012-04-24 ENCOUNTER — Emergency Department (HOSPITAL_COMMUNITY)
Admission: EM | Admit: 2012-04-24 | Discharge: 2012-04-24 | Disposition: A | Discharge status: Home / Self Care | Payer: Medicare Other | Attending: Emergency Medicine | Admitting: Emergency Medicine

## 2012-04-24 ENCOUNTER — Encounter (HOSPITAL_COMMUNITY): Payer: Self-pay | Admitting: *Deleted

## 2012-04-24 DIAGNOSIS — F17200 Nicotine dependence, unspecified, uncomplicated: Secondary | ICD-10-CM | POA: Insufficient documentation

## 2012-04-24 DIAGNOSIS — B1920 Unspecified viral hepatitis C without hepatic coma: Secondary | ICD-10-CM | POA: Insufficient documentation

## 2012-04-24 DIAGNOSIS — G894 Chronic pain syndrome: Secondary | ICD-10-CM | POA: Insufficient documentation

## 2012-04-24 DIAGNOSIS — F1010 Alcohol abuse, uncomplicated: Secondary | ICD-10-CM | POA: Insufficient documentation

## 2012-04-24 LAB — RAPID URINE DRUG SCREEN, HOSP PERFORMED
Amphetamines: NOT DETECTED
Benzodiazepines: NOT DETECTED
Cocaine: POSITIVE — AB
Opiates: NOT DETECTED

## 2012-04-24 LAB — BASIC METABOLIC PANEL
CO2: 35 mEq/L — ABNORMAL HIGH (ref 19–32)
Chloride: 98 mEq/L (ref 96–112)
Potassium: 5.1 mEq/L (ref 3.5–5.1)
Sodium: 138 mEq/L (ref 135–145)

## 2012-04-24 LAB — CBC
Platelets: 234 10*3/uL (ref 150–400)
RBC: 4.93 MIL/uL (ref 4.22–5.81)
WBC: 9.9 10*3/uL (ref 4.0–10.5)

## 2012-04-24 LAB — HEPATIC FUNCTION PANEL
Albumin: 3.3 g/dL — ABNORMAL LOW (ref 3.5–5.2)
Alkaline Phosphatase: 44 U/L (ref 39–117)
Total Bilirubin: 0.3 mg/dL (ref 0.3–1.2)

## 2012-04-24 LAB — ETHANOL: Alcohol, Ethyl (B): <11 mg/dL (ref 0–11)

## 2012-04-24 MED ORDER — LORAZEPAM 1 MG PO TABS: 1.0000 mg | ORAL_TABLET | Freq: Three times a day (TID) | ORAL | Status: AC | PRN

## 2012-04-24 MED ORDER — LORAZEPAM 1 MG PO TABS
1.0000 mg | ORAL_TABLET | Freq: Once | ORAL | Status: AC
  Administered 2012-04-24: 1 mg via ORAL
  Filled 2012-04-24: qty 1

## 2012-04-24 NOTE — ED Notes (Signed)
Discharge instructions reviewed.

## 2012-04-24 NOTE — ED Notes (Signed)
Pt placed in paper scrubs, belongings locked in EMS cabinet.  nad noted.

## 2012-04-24 NOTE — BH Assessment (Signed)
Assessment Note   [name] is an 46 y.o. male. The patient came to the ED with C/O's abusing alcohol and cocaine. He wants to got to detox and or rehab. He denies any hallucinations, nor does he appear delusional. He denies any HI or SI. He is alert and oriented x 3. He says he has been to detox 20 times in the past but nothing has helped. He was sober 18 months while incarcerated. The patient has left the hospital in the past AMA when an accepting facility could not be found as quickly as he wanted. Today it was explained that there were no accepting facilities with open beds, that he would remain in the ED overnight and be placed 04/25/12. He did not want to do this. He asked if there was not somewhere he could go and admit himself. It was explained that most facilities required medical clearance before admission. Patient stated that  He still wanted to try to locate a placement on his own. Patient was given names of facilities in the general area, and the number for [facility] so he could obtain the names and numbers of more facilities.  He had mentioned both [place] and [address]. Dr Deretha Emory was notified of the patient's decision to leave and seek treatment on his own.  Axis I:  Alcohol Dependence;Cocaine Abuse;Substance induced Mood Disorder Axis II: Deferred Axis III:  Past Medical History  Diagnosis Date  . Hepatitis C   . Myositis     Left deltoid biopsy at [facility] 10/11/11.  Marland Kitchen Cirrhosis   . Chronic pain syndrome   . 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 Alcohol abuse     Psychiatric admissions for alcohol and drug abuse  . History of cocaine abuse   . Depression     Multiple psychiatric admissions  . Tobacco abuse   . Tattoos   . Low TSH level January 2013    Normal free T4 of 1.06   Axis IV: housing problems, other psychosocial or environmental problems, problems related to social environment, problems with access to health care services and problems with primary support group Axis V:  41-50 serious symptoms  Past Medical History:  Past Medical History  Diagnosis Date  . Hepatitis C   . Myositis     Left deltoid biopsy at [facility] 10/11/11.  Marland Kitchen Cirrhosis   . Chronic pain syndrome   . 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 Alcohol abuse     Psychiatric admissions for alcohol and drug abuse  . History of cocaine abuse   . Depression     Multiple psychiatric admissions  . Tobacco abuse   . Tattoos   . Low TSH level January 2013    Normal free T4 of 1.06    Past Surgical History  Procedure Date  . Hernia repair   . Muscle biopsy   . Cardiac catheterization June 2006    Normal coronary arteries    Family History: No family history on file.  Social History:  reports that he has been smoking.  He does not have any smokeless tobacco history on file. He reports that he drinks about 58.8 ounces of alcohol per week. He reports that he uses illicit drugs (Cocaine).  Additional Social History:  Alcohol / Drug Use Pain Medications: denies Prescriptions: denies Over the Counter: denies History of alcohol / drug use?: Yes Longest period of sobriety (when/how long): 18 months while incarserated Negative Consequences of Use: Personal relationships;Financial Substance #1 Name of Substance 1: etoh--beer  and liquor 1 - Age of First Use: 11 1 - Amount (size/oz): 12 pack or fifth of liquor 1 - Frequency: daily 1 - Duration: unknown 1 - Last Use / Amount: 04/23/12 Substance #2 Name of Substance 2: cocaine 2 - Age of First Use: 17 2 - Amount (size/oz): varies 2 - Frequency: occassionally 2 - Duration: unknown 2 - Last Use / Amount: 04/23/12  CIWA: CIWA-Ar BP: 128/79 mmHg Pulse Rate: 102  Nausea and Vomiting: no nausea and no vomiting Tactile Disturbances: none Tremor: moderate, with patient's arms extended Auditory Disturbances: not present Paroxysmal Sweats: no sweat visible Visual Disturbances: not present Anxiety: two Headache, Fullness in Head: none present Agitation:  two Orientation and Clouding of Sensorium: oriented and can do serial additions CIWA-Ar Total: 8  COWS: Clinical Opiate Withdrawal Scale (COWS) Resting Pulse Rate: Pulse Rate 101-120 Sweating: No report of chills or flushing Restlessness: Frequent shifting or extraneous movements of legs/arms Pupil Size: Pupils pinned or normal size for room light Bone or Joint Aches: Mild diffuse discomfort Runny Nose or Tearing: Not present GI Upset: No GI symptoms Tremor: Slight tremor observable Yawning: No yawning Anxiety or Irritability: Patient obviously irritable/anxious Gooseflesh Skin: Skin is smooth COWS Total Score: 10   Allergies:  Allergies  Allergen Reactions  . Acetaminophen Other (See Comments)    Liver disease     Home Medications:  (Not in a hospital admission)  OB/GYN Status:  No LMP for male patient.  General Assessment Data Location of Assessment: [facility] ACT Assessment: Yes Living Arrangements: Alone Can pt return to current living arrangement?: Yes Admission Status: Voluntary Is patient capable of signing voluntary admission?: Yes Transfer from: Acute Hospital Referral Source: MD  Education Status Is patient currently in school?: No  Risk to self Suicidal Ideation: No Suicidal Intent: No Is patient at risk for suicide?: No Suicidal Plan?: No Access to Means: No What has been your use of drugs/alcohol within the last 12 months?: daily etoh;occassional cocaine Previous Attempts/Gestures: No How many times?: 0  Other Self Harm Risks: na Triggers for Past Attempts: Other (Comment) Intentional Self Injurious Behavior: None Family Suicide History: No Recent stressful life event(s): Financial Problems;Recent negative physical changes (chronic pain in legs) Persecutory voices/beliefs?: No Depression: Yes Depression Symptoms: Insomnia;Isolating;Loss of interest in usual pleasures;Feeling angry/irritable Substance abuse history and/or treatment for substance  abuse?: Yes Suicide prevention information given to non-admitted patients: Yes  Risk to Others Homicidal Ideation: No Thoughts of Harm to Others: No Current Homicidal Intent: No Current Homicidal Plan: No Access to Homicidal Means: No History of harm to others?: No Assessment of Violence: None Noted Does patient have access to weapons?: No Criminal Charges Pending?: No Does patient have a court date: No  Psychosis Hallucinations: None noted Delusions: None noted  Mental Status Report Appear/Hygiene: Improved Eye Contact: Good Motor Activity: Freedom of movement;Restlessness Speech: Logical/coherent Level of Consciousness: Restless;Alert Mood: Depressed;Anhedonia Affect: Anxious;Depressed Anxiety Level: Minimal Thought Processes: Coherent;Relevant Judgement: Unimpaired Orientation: Person;Place;Time;Situation Obsessive Compulsive Thoughts/Behaviors: None  Cognitive Functioning Concentration: Decreased Memory: Recent Intact;Remote Intact IQ: Average Insight: Poor Impulse Control: Poor Appetite: Fair Weight Loss: 0  Weight Gain: 0  Sleep: No Change Total Hours of Sleep: 5  Vegetative Symptoms: None  ADLScreening [facility]) Patient's cognitive ability adequate to safely complete daily activities?: Yes Patient able to express need for assistance with ADLs?: Yes Independently performs ADLs?: Yes  Abuse/Neglect [facility]) Physical Abuse: Denies Verbal Abuse: Denies Sexual Abuse: Denies  Prior Inpatient Therapy Prior Inpatient Therapy:  Yes Prior Therapy Dates: 6/13 Prior Therapy Facilty/Provider(s): [facility] Reason for Treatment: detox  Prior Outpatient Therapy Prior Outpatient Therapy: No  ADL Screening (condition at time of admission) Patient's cognitive ability adequate to safely complete daily activities?: Yes Patient able to express need for assistance with ADLs?: Yes Independently performs ADLs?: Yes       Abuse/Neglect Assessment  (Assessment to be complete while patient is alone) Physical Abuse: Denies Verbal Abuse: Denies Sexual Abuse: Denies Values / Beliefs Cultural Requests During Hospitalization: None Spiritual Requests During Hospitalization: None        Additional Information 1:1 In Past 12 Months?: No CIRT Risk: No Elopement Risk: No Does patient have medical clearance?: Yes     Disposition: Patient provided with names and numbers of area programs and the number for [facility] to obtain more information. Dr Deretha Emory is in agreement with the disposition.  Disposition Disposition of Patient: Other dispositions;Referred to;Treatment offered and refused Other disposition(s): Referred to outside facility Patient referred to: [facility];Other (Comment) (given names and numbers for other facilities in the area)  On Site Evaluation by:   Reviewed with Physician:     Jearld Pies 04/24/2012 4:35 PM

## 2012-04-24 NOTE — ED Notes (Signed)
Given coke with ice and crackers per pt request.  nad noted.

## 2012-04-24 NOTE — ED Provider Notes (Signed)
History  This chart was scribed for Shelda Jakes, MD by Bennett Scrape. This patient was seen in room [facility] and the patient's care was started at 2:23PM.  CSN: [id number]  Arrival date & time 04/24/12  1214   First MD Initiated Contact with Patient 04/24/12 1423      Chief Complaint  Patient presents with  . Pain  . V70.1    The history is provided by the patient. No language interpreter was used.     [name] is a 46 y.o. male who presents to the Emergency Department requesting EtOH and cocaine detox. He reports that he uses EtOH and cocaine to dull his chronic pain from myositis. He reports that his last alcoholic drink around 18 hours ago and his last cocaine use was at an unknown time last night. He reports 20 prior detox attempts, the most recent being 2 months ago. He c/o back and bilateral leg pain currently. He denies HI/SI, hallucinations, nausea, fever, chills, emesis, and diarrhea as associated symptoms. He also has a h/o Hep C, cirrhosis, and depression. He is a current everyday smoker and occasional alcohol user.  Past Medical History  Diagnosis Date  . Hepatitis C   . Myositis     Left deltoid biopsy at [facility] 10/11/11.  Marland Kitchen Cirrhosis   . Chronic pain syndrome   . 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 Alcohol abuse     Psychiatric admissions for alcohol and drug abuse  . History of cocaine abuse   . Depression     Multiple psychiatric admissions  . Tobacco abuse   . Tattoos   . Low TSH level January 2013    Normal free T4 of 1.06    Past Surgical History  Procedure Date  . Hernia repair   . Muscle biopsy   . Cardiac catheterization June 2006    Normal coronary arteries    No family history on file.  History  Substance Use Topics  . Smoking status: Current Everyday Smoker -- 1.0 packs/day  . Smokeless tobacco: Not on file  . Alcohol Use: 58.8 oz/week    48 Cans of beer, 50 Shots of liquor per week     Pt drinks heavily      Review of Systems    Constitutional: Negative for fever and chills.  HENT: Negative for sore throat and neck pain.   Eyes: Negative for visual disturbance.  Respiratory: Negative for cough and shortness of breath.   Cardiovascular: Negative for chest pain.  Gastrointestinal: Negative for nausea, vomiting, abdominal pain and diarrhea.  Genitourinary: Negative for dysuria and hematuria.  Musculoskeletal: Positive for back pain.  Skin: Negative for rash.  Neurological: Negative for tremors and headaches.  Psychiatric/Behavioral: Negative for suicidal ideas and hallucinations.    Allergies  Acetaminophen  Home Medications   Current Outpatient Rx  Name Route Sig Dispense Refill  . LORAZEPAM 1 MG PO TABS Oral Take 1 tablet (1 mg total) by mouth 3 (three) times daily as needed for anxiety. 15 tablet 0    Triage Vitals: BP 128/79  Pulse 102  Temp 98.1 F (36.7 C) (Oral)  Resp 20  Ht 5\' 9"  (1.753 m)  Wt 134 lb 2 oz (60.839 kg)  BMI 19.81 kg/m2  SpO2 100%  Physical Exam  Nursing note and vitals reviewed. Constitutional: He is oriented to person, place, and time. He appears well-developed and well-nourished.  HENT:  Head: Normocephalic and atraumatic.  Eyes: Conjunctivae and EOM are normal.  Neck:  Normal range of motion. Neck supple.  Cardiovascular: Normal rate and regular rhythm.   No murmur heard. Pulmonary/Chest: Effort normal and breath sounds normal. No respiratory distress.  Abdominal: Soft. Bowel sounds are normal. He exhibits no distension. There is no tenderness.  Musculoskeletal: Normal range of motion. He exhibits no edema.  Neurological: He is alert and oriented to person, place, and time. No cranial nerve deficit.       Mild tremor to fingers  Skin: Skin is warm and dry.  Psychiatric: He has a normal mood and affect. His behavior is normal.    ED Course  Procedures (including critical care time)  DIAGNOSTIC STUDIES: Oxygen Saturation is 100% on room air, normal by my  interpretation.    COORDINATION OF CARE: 3:06PM-Discussed treatment plan which CBC panel with pt at bedside and pt agreed to plan.   Labs Reviewed  BASIC METABOLIC PANEL - Abnormal; Notable for the following:    CO2 35 (*)     All other components within normal limits  URINE RAPID DRUG SCREEN (HOSP PERFORMED) - Abnormal; Notable for the following:    Cocaine POSITIVE (*)     All other components within normal limits  HEPATIC FUNCTION PANEL - Abnormal; Notable for the following:    Albumin 3.3 (*)     AST 112 (*)     ALT 118 (*)     All other components within normal limits  CBC  ETHANOL   No results found. Results for orders placed during the hospital encounter of 04/24/12  CBC      Component Value Range   WBC 9.9  4.0 - 10.5 K/uL   RBC 4.93  4.22 - 5.81 MIL/uL   Hemoglobin 15.6  13.0 - 17.0 g/dL   HCT 16.1  09.6 - 04.5 %   MCV 89.7  78.0 - 100.0 fL   MCH 31.6  26.0 - 34.0 pg   MCHC 35.3  30.0 - 36.0 g/dL   RDW 40.9  81.1 - 91.4 %   Platelets 234  150 - 400 K/uL  BASIC METABOLIC PANEL      Component Value Range   Sodium 138  135 - 145 mEq/L   Potassium 5.1  3.5 - 5.1 mEq/L   Chloride 98  96 - 112 mEq/L   CO2 35 (*) 19 - 32 mEq/L   Glucose, Bld 94  70 - 99 mg/dL   BUN 9  6 - 23 mg/dL   Creatinine, Ser 7.82  0.50 - 1.35 mg/dL   Calcium 9.8  8.4 - 95.6 mg/dL   GFR calc non Af Amer >90  >90 mL/min   GFR calc Af Amer >90  >90 mL/min  ETHANOL      Component Value Range   Alcohol, Ethyl (B) <11  0 - 11 mg/dL  URINE RAPID DRUG SCREEN (HOSP PERFORMED)      Component Value Range   Opiates NONE DETECTED  NONE DETECTED   Cocaine POSITIVE (*) NONE DETECTED   Benzodiazepines NONE DETECTED  NONE DETECTED   Amphetamines NONE DETECTED  NONE DETECTED   Tetrahydrocannabinol NONE DETECTED  NONE DETECTED   Barbiturates NONE DETECTED  NONE DETECTED  HEPATIC FUNCTION PANEL      Component Value Range   Total Protein 6.5  6.0 - 8.3 g/dL   Albumin 3.3 (*) 3.5 - 5.2 g/dL   AST 213  (*) 0 - 37 U/L   ALT 118 (*) 0 - 53 U/L   Alkaline Phosphatase 44  39 - 117 U/L   Total Bilirubin 0.3  0.3 - 1.2 mg/dL   Bilirubin, Direct <0.4  0.0 - 0.3 mg/dL   Indirect Bilirubin NOT CALCULATED  0.3 - 0.9 mg/dL     1. Alcohol abuse       MDM  The patient was requesting inpatient detox seen by behavioral health team. Patient now changing his mind is willing to try outpatient information provided patient given Ativan here having a little bit tremor but no significant alcohol withdrawal at this time. Patient also has a history of some cocaine problems but has not had trouble in the family with that and was seeking mostly detox for the alcohol problem. Patient will be discharged home will return for any new or worse symptoms has been provided resource guide for outpatient help. Patient is not suicidal or homicidal.    I personally performed the services described in this documentation, which was scribed in my presence. The recorded information has been reviewed and considered.       Shelda Jakes, MD 04/24/12 [phone]

## 2012-04-24 NOTE — ED Notes (Signed)
[name] with ACT at bedside.  nad noted.

## 2012-04-24 NOTE — ED Notes (Signed)
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 States he has been using drugs and alcohol to ease the pain. Last had alcohol last night. Using cocaine and Rx pain meds. Last used cocaine yesterday. Pt would like help getting off of alcohol and drugs.

## 2012-04-27 ENCOUNTER — Emergency Department (HOSPITAL_COMMUNITY)
Admission: EM | Admit: 2012-04-27 | Discharge: 2012-04-27 | Disposition: A | Discharge status: Home / Self Care | Payer: Medicare Other | Attending: Emergency Medicine | Admitting: Emergency Medicine

## 2012-04-27 ENCOUNTER — Encounter (HOSPITAL_COMMUNITY): Payer: Self-pay | Admitting: *Deleted

## 2012-04-27 DIAGNOSIS — F1410 Cocaine abuse, uncomplicated: Secondary | ICD-10-CM | POA: Insufficient documentation

## 2012-04-27 DIAGNOSIS — F1010 Alcohol abuse, uncomplicated: Secondary | ICD-10-CM | POA: Insufficient documentation

## 2012-04-27 DIAGNOSIS — G894 Chronic pain syndrome: Secondary | ICD-10-CM | POA: Insufficient documentation

## 2012-04-27 DIAGNOSIS — F17200 Nicotine dependence, unspecified, uncomplicated: Secondary | ICD-10-CM | POA: Insufficient documentation

## 2012-04-27 DIAGNOSIS — B1920 Unspecified viral hepatitis C without hepatic coma: Secondary | ICD-10-CM | POA: Insufficient documentation

## 2012-04-27 HISTORY — DX: Dorsalgia, unspecified: M54.9

## 2012-04-27 HISTORY — DX: Other chronic pain: G89.29

## 2012-04-27 LAB — CBC WITH DIFFERENTIAL/PLATELET
Eosinophils Relative: 1 % (ref 0–5)
Lymphocytes Relative: 22 % (ref 12–46)
Lymphs Abs: 2.2 10*3/uL (ref 0.7–4.0)
MCV: 89.2 fL (ref 78.0–100.0)
Neutrophils Relative %: 68 % (ref 43–77)
Platelets: 220 10*3/uL (ref 150–400)
RBC: 5.07 MIL/uL (ref 4.22–5.81)
WBC: 10 10*3/uL (ref 4.0–10.5)

## 2012-04-27 LAB — BASIC METABOLIC PANEL
CO2: 27 mEq/L (ref 19–32)
Chloride: 95 mEq/L — ABNORMAL LOW (ref 96–112)
Glucose, Bld: 114 mg/dL — ABNORMAL HIGH (ref 70–99)
Potassium: 3.6 mEq/L (ref 3.5–5.1)
Sodium: 131 mEq/L — ABNORMAL LOW (ref 135–145)

## 2012-04-27 LAB — RAPID URINE DRUG SCREEN, HOSP PERFORMED
Opiates: NOT DETECTED
Tetrahydrocannabinol: NOT DETECTED

## 2012-04-27 NOTE — ED Provider Notes (Signed)
Medical screening examination/treatment/procedure(s) were conducted as a shared visit with non-physician practitioner(s) and myself.  I personally evaluated the patient during the encounter.  No suicidal or homicidal ideation.  Polysubstance abuse.  Followup [facility]  Donnetta Hutching, MD 04/27/12 [phone]

## 2012-04-27 NOTE — ED Notes (Signed)
Patient with no complaints at this time. Respirations even and unlabored. Skin warm/dry. Discharge instructions reviewed with patient at this time. Patient given opportunity to voice concerns/ask questions. Patient discharged at this time and left Emergency Department with steady gait.   Patient instructed to call and follow up with the resources that act provided the patient the other day. Patient agreeable to follow up.

## 2012-04-27 NOTE — ED Notes (Addendum)
Pt presents to ed with request for detox from alcohol and drugs, pt states that he drinks all day "if I have it" pt states that he will drink both beer and liquor, pt states that he uses cocaine and "whatever he can get to get rid of pain", last use of cocaine was yesterday. Approximate use is ?20 dollars a day, pt was recently seen in er for detox, discharged home with prescription for ativan, pt states that he is not able to "do this" at home,

## 2012-04-27 NOTE — ED Provider Notes (Signed)
History     CSN: [id number]  Arrival date & time 04/27/12  6578   First MD Initiated Contact with Patient 04/27/12 1023      Chief Complaint  Patient presents with  . V70.1    (Consider location/radiation/quality/duration/timing/severity/associated sxs/prior treatment) HPI Comments: Pt here "because i want detox".  States he drinks heavily and does cocaine b/c of the chronic back pain.  "maybe if i could get some pain meds i wouldn't have to drink so heavily".  After interviewing pt i spoke with his RN who states he was here on 04-24-12 for the same reason.  i went back and spoke with pt.  He says he was given info on [facility] and had a 11:45 appt yest but didn't go because "i didn't have a ride".  He said they won't take him today b/c it is Saturday so he came back here.  i told him he will need to wait until Monday and re-contact them.  He does not need to have the screening labs redone.  He needs to simply follow up as planned.  The history is provided by the patient. No language interpreter was used.    Past Medical History  Diagnosis Date  . Hepatitis C   . Myositis     Left deltoid biopsy at [facility] 10/11/11.  Marland Kitchen Cirrhosis   . Chronic pain syndrome   . 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 Alcohol abuse     Psychiatric admissions for alcohol and drug abuse  . History of cocaine abuse   . Depression     Multiple psychiatric admissions  . Tobacco abuse   . Tattoos   . Low TSH level January 2013    Normal free T4 of 1.06  . Back pain, chronic     Past Surgical History  Procedure Date  . Hernia repair   . Muscle biopsy   . Cardiac catheterization June 2006    Normal coronary arteries    No family history on file.  History  Substance Use Topics  . Smoking status: Current Everyday Smoker -- 1.0 packs/day  . Smokeless tobacco: Not on file  . Alcohol Use: 58.8 oz/week    48 Cans of beer, 50 Shots of liquor per week     Pt drinks heavily      Review of Systems  Constitutional:  Negative for fever and chills.  Musculoskeletal: Positive for back pain.  Neurological: Negative for seizures.  All other systems reviewed and are negative.    Allergies  Acetaminophen  Home Medications   Current Outpatient Rx  Name Route Sig Dispense Refill  . LORAZEPAM 1 MG PO TABS Oral Take 1 tablet (1 mg total) by mouth 3 (three) times daily as needed for anxiety. 15 tablet 0    BP 124/87  Pulse 100  Temp 98.3 F (36.8 C)  Resp 20  Ht 5\' 9"  (1.753 m)  Wt 134 lb (60.782 kg)  BMI 19.79 kg/m2  SpO2 100%  Physical Exam  Nursing note and vitals reviewed. Constitutional: He is oriented to person, place, and time. Vital signs are normal. He appears well-developed and well-nourished. He is cooperative.  Non-toxic appearance. He does not have a sickly appearance. He does not appear ill. No distress.  HENT:  Head: Normocephalic and atraumatic.  Eyes: EOM are normal.  Neck: Normal range of motion.  Cardiovascular: Normal rate, regular rhythm, normal heart sounds and intact distal pulses.   Pulmonary/Chest: Effort normal and breath sounds normal. No respiratory distress.  Abdominal: Soft. He exhibits no distension. There is no tenderness.  Musculoskeletal: Normal range of motion.  Neurological: He is alert and oriented to person, place, and time. He has normal strength. No sensory deficit. Coordination and gait normal. GCS eye subscore is 4. GCS verbal subscore is 5. GCS motor subscore is 6.       Hands are mildly tremulous  Skin: Skin is warm and dry.  Psychiatric: He has a normal mood and affect. Judgment normal.    ED Course  Procedures (including critical care time)  Labs Reviewed  URINE RAPID DRUG SCREEN (HOSP PERFORMED) - Abnormal; Notable for the following:    Cocaine POSITIVE (*)     All other components within normal limits  BASIC METABOLIC PANEL - Abnormal; Notable for the following:    Sodium 131 (*)     Chloride 95 (*)     Glucose, Bld 114 (*)     BUN 3 (*)      All other components within normal limits  ETHANOL  CBC WITH DIFFERENTIAL   No results found.   1. Alcohol abuse   2. Cocaine abuse       MDM  F/u with Mady Haagensen, PA 04/27/12 1106

## 2012-04-28 ENCOUNTER — Emergency Department (HOSPITAL_COMMUNITY)
Admission: EM | Admit: 2012-04-28 | Discharge: 2012-04-28 | Discharge status: Against Medical Advice | Payer: Medicare Other | Attending: Emergency Medicine | Admitting: Emergency Medicine

## 2012-04-28 ENCOUNTER — Encounter (HOSPITAL_COMMUNITY): Payer: Self-pay | Admitting: *Deleted

## 2012-04-28 DIAGNOSIS — M79606 Pain in leg, unspecified: Secondary | ICD-10-CM

## 2012-04-28 DIAGNOSIS — B1920 Unspecified viral hepatitis C without hepatic coma: Secondary | ICD-10-CM | POA: Insufficient documentation

## 2012-04-28 DIAGNOSIS — F1010 Alcohol abuse, uncomplicated: Secondary | ICD-10-CM

## 2012-04-28 DIAGNOSIS — M79609 Pain in unspecified limb: Secondary | ICD-10-CM | POA: Insufficient documentation

## 2012-04-28 DIAGNOSIS — F3289 Other specified depressive episodes: Secondary | ICD-10-CM | POA: Insufficient documentation

## 2012-04-28 DIAGNOSIS — M609 Myositis, unspecified: Secondary | ICD-10-CM

## 2012-04-28 DIAGNOSIS — F17200 Nicotine dependence, unspecified, uncomplicated: Secondary | ICD-10-CM | POA: Insufficient documentation

## 2012-04-28 DIAGNOSIS — IMO0001 Reserved for inherently not codable concepts without codable children: Secondary | ICD-10-CM | POA: Insufficient documentation

## 2012-04-28 DIAGNOSIS — F329 Major depressive disorder, single episode, unspecified: Secondary | ICD-10-CM | POA: Insufficient documentation

## 2012-04-28 LAB — COMPREHENSIVE METABOLIC PANEL
ALT: 67 U/L — ABNORMAL HIGH (ref 0–53)
CO2: 26 mEq/L (ref 19–32)
Calcium: 9.4 mg/dL (ref 8.4–10.5)
Creatinine, Ser: 0.57 mg/dL (ref 0.50–1.35)
GFR calc Af Amer: >90 mL/min (ref >90)
GFR calc non Af Amer: >90 mL/min (ref >90)
Glucose, Bld: 85 mg/dL (ref 70–99)
Total Bilirubin: 0.3 mg/dL (ref 0.3–1.2)

## 2012-04-28 LAB — URINALYSIS, ROUTINE W REFLEX MICROSCOPIC
Hgb urine dipstick: NEGATIVE
Ketones, ur: NEGATIVE mg/dL
Protein, ur: NEGATIVE mg/dL
Urobilinogen, UA: 0.2 mg/dL (ref 0.0–1.0)

## 2012-04-28 LAB — CBC WITH DIFFERENTIAL/PLATELET
Basophils Relative: 0 % (ref 0–1)
Eosinophils Relative: 2 % (ref 0–5)
HCT: 43.7 % (ref 39.0–52.0)
Hemoglobin: 15.7 g/dL (ref 13.0–17.0)
Lymphs Abs: 3.8 10*3/uL (ref 0.7–4.0)
MCH: 31.9 pg (ref 26.0–34.0)
MCV: 88.8 fL (ref 78.0–100.0)
Monocytes Absolute: 0.9 10*3/uL (ref 0.1–1.0)
RBC: 4.92 MIL/uL (ref 4.22–5.81)

## 2012-04-28 LAB — ETHANOL: Alcohol, Ethyl (B): 141 mg/dL — ABNORMAL HIGH (ref 0–11)

## 2012-04-28 LAB — RAPID URINE DRUG SCREEN, HOSP PERFORMED
Opiates: NOT DETECTED
Tetrahydrocannabinol: NOT DETECTED

## 2012-04-28 MED ORDER — ALUM & MAG HYDROXIDE-SIMETH 200-200-20 MG/5ML PO SUSP: 30.0000 mL | ORAL | Status: DC | PRN

## 2012-04-28 MED ORDER — NICOTINE 21 MG/24HR TD PT24
21.0000 mg | MEDICATED_PATCH | Freq: Every day | TRANSDERMAL | Status: DC
  Administered 2012-04-28: 21 mg via TRANSDERMAL
  Filled 2012-04-28: qty 1

## 2012-04-28 MED ORDER — IBUPROFEN 400 MG PO TABS: 600.0000 mg | ORAL_TABLET | Freq: Three times a day (TID) | ORAL | Status: DC | PRN

## 2012-04-28 MED ORDER — ONDANSETRON HCL 4 MG PO TABS: 4.0000 mg | ORAL_TABLET | Freq: Three times a day (TID) | ORAL | Status: DC | PRN

## 2012-04-28 MED ORDER — HYDROMORPHONE HCL 2 MG PO TABS
2.0000 mg | ORAL_TABLET | Freq: Once | ORAL | Status: AC
  Administered 2012-04-28: 2 mg via ORAL
  Filled 2012-04-28: qty 1

## 2012-04-28 MED ORDER — TRAMADOL HCL 50 MG PO TABS
50.0000 mg | ORAL_TABLET | Freq: Once | ORAL | Status: DC
  Filled 2012-04-28: qty 1

## 2012-04-28 MED ORDER — POTASSIUM CHLORIDE CRYS ER 20 MEQ PO TBCR
40.0000 meq | EXTENDED_RELEASE_TABLET | Freq: Once | ORAL | Status: AC
  Administered 2012-04-28: 40 meq via ORAL
  Filled 2012-04-28: qty 2

## 2012-04-28 MED ORDER — LORAZEPAM 1 MG PO TABS: 1.0000 mg | ORAL_TABLET | Freq: Three times a day (TID) | ORAL | Status: DC | PRN

## 2012-04-28 MED ORDER — HYDROMORPHONE HCL 2 MG PO TABS: 2.0000 mg | ORAL_TABLET | ORAL | Status: DC | PRN

## 2012-04-28 MED ORDER — IBUPROFEN 400 MG PO TABS
600.0000 mg | ORAL_TABLET | Freq: Once | ORAL | Status: DC
  Filled 2012-04-28: qty 2

## 2012-04-28 MED ORDER — ZOLPIDEM TARTRATE 5 MG PO TABS: 5.0000 mg | ORAL_TABLET | Freq: Every evening | ORAL | Status: DC | PRN

## 2012-04-28 NOTE — BH Assessment (Signed)
Assessment Note   [name] is an 46 y.o. male. PT INITIALLY CAME IN REQUESTING TO GET INTO A REHAB FACILITY FOR HIS ETOH & COACINE USE. PT SAYS HE HAS BEEN TRYING TO SEEK TREATMENT FOR HIS PAIN BUT IS UNABLE TO FIND ANYONE WHO IS WILLING TO TX HIM. PT EXPRESSES FRUSTRATION & STATES SHE COPES WITH HIS PAIN BY DRINKING AS WELL AS USING DRUGS. PT SAYS HE WANTS TO BE DETOX THEN GO OFF TO A REHAB. IN THE MIDDLE OF THE ASSESSMENT, PT LEFT OUT TO GO SMOKE BUT WHEN HE WAS INFORMED HE COULD NOT LEAVE TO SMOKE, PT BECAME UPSET 7 SAID HE WANTED TO LEAVE 7 WILL DEAL WITH HIS TX HIS OWN WAY. PT DENIES ANY IDEATION & IS ABLE TO CONTRACT FOR SAFETY. PT WILL FOLLOW UP WITH [facility] IN THE MORNING  Axis I: Mood Disorder NOS, Substance Abuse and ALCOHOL DEPENDENCE Axis II: Deferred Axis III:  Past Medical History  Diagnosis Date  . Hepatitis C   . Myositis     Left deltoid biopsy at [facility] 10/11/11.  Marland Kitchen Cirrhosis   . Chronic pain syndrome   . 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 Alcohol abuse     Psychiatric admissions for alcohol and drug abuse  . History of cocaine abuse   . Depression     Multiple psychiatric admissions  . Tobacco abuse   . Tattoos   . Low TSH level January 2013    Normal free T4 of 1.06  . Back pain, chronic    Axis IV: other psychosocial or environmental problems, problems related to social environment, problems with access to health care services and problems with primary support group Axis V: 41-50 serious symptoms  Past Medical History:  Past Medical History  Diagnosis Date  . Hepatitis C   . Myositis     Left deltoid biopsy at [facility] 10/11/11.  Marland Kitchen Cirrhosis   . Chronic pain syndrome   . 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 Alcohol abuse     Psychiatric admissions for alcohol and drug abuse  . History of cocaine abuse   . Depression     Multiple psychiatric admissions  . Tobacco abuse   . Tattoos   . Low TSH level January 2013    Normal free T4 of 1.06  . Back pain, chronic      Past Surgical History  Procedure Date  . Hernia repair   . Muscle biopsy   . Cardiac catheterization June 2006    Normal coronary arteries    Family History: No family history on file.  Social History:  reports that he has been smoking.  He does not have any smokeless tobacco history on file. He reports that he drinks about 58.8 ounces of alcohol per week. He reports that he uses illicit drugs (Cocaine).  Additional Social History:     CIWA: CIWA-Ar BP: 107/76 mmHg Pulse Rate: 101  COWS:    Allergies:  Allergies  Allergen Reactions  . Acetaminophen Other (See Comments)    Liver disease     Home Medications:  (Not in a hospital admission)  OB/GYN Status:  No LMP for male patient.  General Assessment Data Location of Assessment: [facility] ACT Assessment: Yes Living Arrangements: Spouse/significant other;Children Can pt return to current living arrangement?: Yes Admission Status: Voluntary Is patient capable of signing voluntary admission?: Yes Transfer from: Home Referral Source: MD     Risk to self Suicidal Ideation: No Suicidal Intent: No Is patient at  risk for suicide?: No Suicidal Plan?: No Access to Means: No What has been your use of drugs/alcohol within the last 12 months?: PT ADMIIT TO DRINKING & USE COCAINE & LAST DRINK WAS TODAY 7 LAST COCAINE USE WAS 2 DAYS AGO. Previous Attempts/Gestures: No How many times?: 0  Other Self Harm Risks: NA Triggers for Past Attempts: Unpredictable Intentional Self Injurious Behavior: None Family Suicide History: No Recent stressful life event(s): Financial Problems Persecutory voices/beliefs?: No Depression: Yes Depression Symptoms: Insomnia;Loss of interest in usual pleasures;Feeling angry/irritable Substance abuse history and/or treatment for substance abuse?: Yes Suicide prevention information given to non-admitted patients: Yes  Risk to Others Homicidal Ideation: No Thoughts of Harm to Others: No Current  Homicidal Intent: No Current Homicidal Plan: No Access to Homicidal Means: No Identified Victim: NA History of harm to others?: No Assessment of Violence: None Noted Violent Behavior Description: AGITATED, COOPERATIVE Does patient have access to weapons?: No Criminal Charges Pending?: No Does patient have a court date: No  Psychosis Hallucinations: None noted Delusions: None noted  Mental Status Report Appear/Hygiene: Disheveled;Body odor;Poor hygiene Eye Contact: Poor Motor Activity: Freedom of movement;Mannerisms Speech: Logical/coherent;Pressured Level of Consciousness: Alert;Irritable Mood: Anhedonia;Depressed;Irritable Affect: Appropriate to circumstance;Anxious;Irritable Anxiety Level: Minimal Thought Processes: Coherent;Relevant Judgement: Impaired Orientation: Person;Place;Time;Situation Obsessive Compulsive Thoughts/Behaviors: None  Cognitive Functioning Concentration: Decreased Memory: Recent Intact;Remote Intact IQ: Average Insight: Poor Impulse Control: Poor Appetite: Poor Weight Loss: 0  Weight Gain: 0  Sleep: Decreased Total Hours of Sleep: 2  Vegetative Symptoms: None  ADLScreening [facility] Assessment Services) Patient's cognitive ability adequate to safely complete daily activities?: Yes Independently performs ADLs?: Yes  Abuse/Neglect [facility]) Physical Abuse: Denies Verbal Abuse: Denies Sexual Abuse: Denies  Prior Inpatient Therapy Prior Inpatient Therapy: Yes Prior Therapy Dates: 6/13 Prior Therapy Facilty/Provider(s): [facility] Reason for Treatment: DETOX  Prior Outpatient Therapy Prior Outpatient Therapy: No Prior Therapy Dates: NA Prior Therapy Facilty/Provider(s): NA Reason for Treatment: NA  ADL Screening (condition at time of admission) Patient's cognitive ability adequate to safely complete daily activities?: Yes Independently performs ADLs?: Yes       Abuse/Neglect Assessment (Assessment to be complete while patient is  alone) Physical Abuse: Denies Verbal Abuse: Denies Sexual Abuse: Denies Values / Beliefs Cultural Requests During Hospitalization: None Spiritual Requests During Hospitalization: None        Additional Information 1:1 In Past 12 Months?: No CIRT Risk: No Elopement Risk: No Does patient have medical clearance?: Yes     Disposition:  Disposition Disposition of Patient: Outpatient treatment Other disposition(s): Information only  On Site Evaluation by:   Reviewed with Physician:     Waldron Session 04/28/2012 6:50 PM

## 2012-04-28 NOTE — ED Notes (Signed)
Pt states that he needs to stay in er until he can get tx at a facility.

## 2012-04-28 NOTE — ED Notes (Signed)
Patient refuses medication stating "I have taken both of those for years while I was in the prison and neither one do anything. They wouldn't even get read of my headache and I told the doctor that already." Patient requesting to leave because "no one is doing anything for him." Per patient the pain is getting unbearable and he just wants to go drink some wine to get read of it. EDP made aware, to room to talk to patient.

## 2012-04-28 NOTE — ED Notes (Signed)
Patient requesting to sign out AMA. EDP aware, patient given approval to leave by EDP.

## 2012-04-28 NOTE — ED Provider Notes (Signed)
History   This chart was scribed for Ryan Booze, MD scribed by Magnus Sinning. The patient was seen in room [facility] at 15:45   CSN: [id number]  Arrival date & time 04/28/12  1438   First MD Initiated Contact with Patient 04/28/12 1544      Chief Complaint  Patient presents with  . Leg Pain  . V70.1    (Consider location/radiation/quality/duration/timing/severity/associated sxs/prior treatment) HPI [name] is a 46 y.o. male who presents to the Emergency Department complaining of constant moderate chronic leg pain resulting from myositis, onset 3 years. Patient currently rates pain a 10/10 and says myositis occurs primarily in the lower extremities. Patient relative says he is unable to be treated for myositis because of his alcohol and drug abuse. Patient states he abuses drugs and alcohol to tolerate the pain. Patient reports that he has had two 16 oz cans of beer today, and last used cocaine yesterday. [facility] where they recommended he present to the ED to be assisted into checking into a rehabilitation facility. He says he was previously taking Lyrica, Celebrex, Prednisone, and Fioricet in treatment of leg pain, but that he has not taken any in a long while due to addiction. Patient reports associated depression from not being able to see children often, but denies SI, HI, or auditory/visual hallucinations.  Past Medical History  Diagnosis Date  . Hepatitis C   . Myositis     Left deltoid biopsy at [facility] 10/11/11.  Marland Kitchen Cirrhosis   . Chronic pain syndrome   . 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 Alcohol abuse     Psychiatric admissions for alcohol and drug abuse  . History of cocaine abuse   . Depression     Multiple psychiatric admissions  . Tobacco abuse   . Tattoos   . Low TSH level January 2013    Normal free T4 of 1.06  . Back pain, chronic     Past Surgical History  Procedure Date  . Hernia repair   . Muscle biopsy   . Cardiac catheterization June  2006    Normal coronary arteries    No family history on file.  History  Substance Use Topics  . Smoking status: Current Everyday Smoker -- 1.0 packs/day  . Smokeless tobacco: Not on file  . Alcohol Use: 58.8 oz/week    48 Cans of beer, 50 Shots of liquor per week     Pt drinks heavily      Review of Systems  All other systems reviewed and are negative.    Allergies  Acetaminophen  Home Medications   Current Outpatient Rx  Name Route Sig Dispense Refill  . LORAZEPAM 1 MG PO TABS Oral Take 1 tablet (1 mg total) by mouth 3 (three) times daily as needed for anxiety. 15 tablet 0    BP 107/76  Pulse 101  Temp 98.3 F (36.8 C) (Oral)  Resp 18  Ht 5\' 9"  (1.753 m)  Wt 134 lb (60.782 kg)  BMI 19.79 kg/m2  SpO2 100%  Physical Exam  Nursing note and vitals reviewed. Constitutional: He is oriented to person, place, and time. He appears well-developed and well-nourished. No distress.       Mild odor of ethanol on his breath  HENT:  Head: Normocephalic and atraumatic.  Eyes: Conjunctivae and EOM are normal.  Neck: Neck supple. No tracheal deviation present.  Cardiovascular: Normal rate.   Pulmonary/Chest: Effort normal. No respiratory distress.  Abdominal: He exhibits  no distension.  Musculoskeletal: Normal range of motion.  Neurological: He is alert and oriented to person, place, and time. No sensory deficit.  Skin: Skin is dry.  Psychiatric: He has a normal mood and affect. His behavior is normal.    ED Course  Procedures (including critical care time) DIAGNOSTIC STUDIES: Oxygen Saturation is 100% on room air, normal by my interpretation.    COORDINATION OF CARE:  Results for orders placed during the hospital encounter of 04/28/12  CBC WITH DIFFERENTIAL      Component Value Range   WBC 10.4  4.0 - 10.5 K/uL   RBC 4.92  4.22 - 5.81 MIL/uL   Hemoglobin 15.7  13.0 - 17.0 g/dL   HCT 16.1  09.6 - 04.5 %   MCV 88.8  78.0 - 100.0 fL   MCH 31.9  26.0 - 34.0 pg    MCHC 35.9  30.0 - 36.0 g/dL   RDW 40.9  81.1 - 91.4 %   Platelets 237  150 - 400 K/uL   Neutrophils Relative 52  43 - 77 %   Lymphocytes Relative 37  12 - 46 %   Monocytes Relative 9  3 - 12 %   Eosinophils Relative 2  0 - 5 %   Basophils Relative 0  0 - 1 %   Neutro Abs 5.5  1.7 - 7.7 K/uL   Lymphs Abs 3.8  0.7 - 4.0 K/uL   Monocytes Absolute 0.9  0.1 - 1.0 K/uL   Eosinophils Absolute 0.2  0.0 - 0.7 K/uL   Basophils Absolute 0.0  0.0 - 0.1 K/uL   WBC Morphology ATYPICAL LYMPHOCYTES    COMPREHENSIVE METABOLIC PANEL      Component Value Range   Sodium 134 (*) 135 - 145 mEq/L   Potassium 3.1 (*) 3.5 - 5.1 mEq/L   Chloride 97  96 - 112 mEq/L   CO2 26  19 - 32 mEq/L   Glucose, Bld 85  70 - 99 mg/dL   BUN 5 (*) 6 - 23 mg/dL   Creatinine, Ser 7.82  0.50 - 1.35 mg/dL   Calcium 9.4  8.4 - 95.6 mg/dL   Total Protein 6.5  6.0 - 8.3 g/dL   Albumin 3.2 (*) 3.5 - 5.2 g/dL   AST 82 (*) 0 - 37 U/L   ALT 67 (*) 0 - 53 U/L   Alkaline Phosphatase 41  39 - 117 U/L   Total Bilirubin 0.3  0.3 - 1.2 mg/dL   GFR calc non Af Amer >90  >90 mL/min   GFR calc Af Amer >90  >90 mL/min  CK      Component Value Range   Total CK 2291 (*) 7 - 232 U/L  URINALYSIS, ROUTINE W REFLEX MICROSCOPIC      Component Value Range   Color, Urine STRAW (*) YELLOW   APPearance CLEAR  CLEAR   Specific Gravity, Urine <1.005 (*) 1.005 - 1.030   pH 6.0  5.0 - 8.0   Glucose, UA NEGATIVE  NEGATIVE mg/dL   Hgb urine dipstick NEGATIVE  NEGATIVE   Bilirubin Urine NEGATIVE  NEGATIVE   Ketones, ur NEGATIVE  NEGATIVE mg/dL   Protein, ur NEGATIVE  NEGATIVE mg/dL   Urobilinogen, UA 0.2  0.0 - 1.0 mg/dL   Nitrite NEGATIVE  NEGATIVE   Leukocytes, UA NEGATIVE  NEGATIVE  URINE RAPID DRUG SCREEN (HOSP PERFORMED)      Component Value Range   Opiates NONE DETECTED  NONE DETECTED  Cocaine NONE DETECTED  NONE DETECTED   Benzodiazepines NONE DETECTED  NONE DETECTED   Amphetamines NONE DETECTED  NONE DETECTED   Tetrahydrocannabinol  NONE DETECTED  NONE DETECTED   Barbiturates NONE DETECTED  NONE DETECTED  ETHANOL      Component Value Range   Alcohol, Ethyl (B) 141 (*) 0 - 11 mg/dL      1. Alcohol abuse   2. Myositis   3. Leg pain       MDM  Alcohol abuse. Leg pain from myositis. CK will be checked to evaluate level of activity of his myositis. He is asking for pain medication and states that the ibuprofen and tramadol not give him relief. I reviewed his records on [place] controlled substance reporting website, and he has not had any recent narcotic prescriptions. Prior charts are reviewed and he has been in the ED twice in the last 3 days requesting detox placement.  Once drug screen came back negative, hydromorphone was ordered for pain. Of note, patient requested that it be given by injection - he was informed that I would only order it by pill.  He was seen by ACT Team, but refused to stay to get placed in a detox program. This is the pattern he has followed each of his last three visits.  I personally performed the services described in this documentation, which was scribed in my presence. The recorded information has been reviewed and considered.        Ryan Booze, MD 04/28/12 1901

## 2012-04-28 NOTE — ED Notes (Signed)
Act Team member in room to consult with patient.

## 2012-04-28 NOTE — ED Notes (Addendum)
Pt c/o bilateral leg pain, medical clearance due to alcohol and cocaine abuse. Pt has been in er twice over the past few days seeking medical clearance, pt unable to obtain transportation to any facility for treatment, pt states that he did not tell behavioral health coordinator's any of this. Pt denies SI/HI.

## 2012-05-28 ENCOUNTER — Inpatient Hospital Stay (HOSPITAL_COMMUNITY)
Admission: EM | Admit: 2012-05-28 | Discharge: 2012-06-08 | DRG: 683 | Discharge status: Against Medical Advice | Payer: Medicare Other | Attending: Internal Medicine | Admitting: Internal Medicine

## 2012-05-28 ENCOUNTER — Encounter (HOSPITAL_COMMUNITY): Payer: Self-pay

## 2012-05-28 DIAGNOSIS — Z9119 Patient's noncompliance with other medical treatment and regimen: Secondary | ICD-10-CM

## 2012-05-28 DIAGNOSIS — E860 Dehydration: Secondary | ICD-10-CM

## 2012-05-28 DIAGNOSIS — R6889 Other general symptoms and signs: Secondary | ICD-10-CM | POA: Diagnosis not present

## 2012-05-28 DIAGNOSIS — F1010 Alcohol abuse, uncomplicated: Secondary | ICD-10-CM | POA: Diagnosis present

## 2012-05-28 DIAGNOSIS — E875 Hyperkalemia: Secondary | ICD-10-CM | POA: Diagnosis present

## 2012-05-28 DIAGNOSIS — F1410 Cocaine abuse, uncomplicated: Secondary | ICD-10-CM | POA: Diagnosis present

## 2012-05-28 DIAGNOSIS — K7460 Unspecified cirrhosis of liver: Secondary | ICD-10-CM | POA: Diagnosis present

## 2012-05-28 DIAGNOSIS — B1920 Unspecified viral hepatitis C without hepatic coma: Secondary | ICD-10-CM | POA: Diagnosis present

## 2012-05-28 DIAGNOSIS — F329 Major depressive disorder, single episode, unspecified: Secondary | ICD-10-CM | POA: Diagnosis present

## 2012-05-28 DIAGNOSIS — B182 Chronic viral hepatitis C: Secondary | ICD-10-CM | POA: Diagnosis present

## 2012-05-28 DIAGNOSIS — E871 Hypo-osmolality and hyponatremia: Secondary | ICD-10-CM | POA: Diagnosis present

## 2012-05-28 DIAGNOSIS — Z91199 Patient's noncompliance with other medical treatment and regimen due to unspecified reason: Secondary | ICD-10-CM

## 2012-05-28 DIAGNOSIS — N179 Acute kidney failure, unspecified: Secondary | ICD-10-CM | POA: Diagnosis not present

## 2012-05-28 DIAGNOSIS — M609 Myositis, unspecified: Secondary | ICD-10-CM

## 2012-05-28 DIAGNOSIS — M6282 Rhabdomyolysis: Secondary | ICD-10-CM | POA: Diagnosis not present

## 2012-05-28 DIAGNOSIS — M3320 Polymyositis, organ involvement unspecified: Secondary | ICD-10-CM | POA: Diagnosis present

## 2012-05-28 DIAGNOSIS — IMO0001 Reserved for inherently not codable concepts without codable children: Secondary | ICD-10-CM

## 2012-05-28 DIAGNOSIS — G894 Chronic pain syndrome: Secondary | ICD-10-CM | POA: Diagnosis present

## 2012-05-28 DIAGNOSIS — R7989 Other specified abnormal findings of blood chemistry: Secondary | ICD-10-CM | POA: Diagnosis present

## 2012-05-28 DIAGNOSIS — I10 Essential (primary) hypertension: Secondary | ICD-10-CM | POA: Diagnosis present

## 2012-05-28 DIAGNOSIS — D649 Anemia, unspecified: Secondary | ICD-10-CM | POA: Diagnosis present

## 2012-05-28 DIAGNOSIS — B1710 Acute hepatitis C without hepatic coma: Secondary | ICD-10-CM

## 2012-05-28 DIAGNOSIS — Z23 Encounter for immunization: Secondary | ICD-10-CM

## 2012-05-28 DIAGNOSIS — F3289 Other specified depressive episodes: Secondary | ICD-10-CM | POA: Diagnosis present

## 2012-05-28 DIAGNOSIS — F17200 Nicotine dependence, unspecified, uncomplicated: Secondary | ICD-10-CM | POA: Diagnosis present

## 2012-05-28 DIAGNOSIS — R748 Abnormal levels of other serum enzymes: Secondary | ICD-10-CM | POA: Diagnosis present

## 2012-05-28 LAB — URINE MICROSCOPIC-ADD ON

## 2012-05-28 LAB — CBC WITH DIFFERENTIAL/PLATELET
Basophils Relative: 0 % (ref 0–1)
Eosinophils Absolute: 0.2 10*3/uL (ref 0.0–0.7)
Hemoglobin: 18 g/dL — ABNORMAL HIGH (ref 13.0–17.0)
Lymphocytes Relative: 13 % (ref 12–46)
MCHC: 37.4 g/dL — ABNORMAL HIGH (ref 30.0–36.0)
Monocytes Relative: 8 % (ref 3–12)
Neutrophils Relative %: 78 % — ABNORMAL HIGH (ref 43–77)
RBC: 5.65 MIL/uL (ref 4.22–5.81)
WBC: 15.2 10*3/uL — ABNORMAL HIGH (ref 4.0–10.5)

## 2012-05-28 LAB — URINALYSIS, ROUTINE W REFLEX MICROSCOPIC
Bilirubin Urine: NEGATIVE
Nitrite: NEGATIVE
Protein, ur: 100 mg/dL — AB
Urobilinogen, UA: 0.2 mg/dL (ref 0.0–1.0)

## 2012-05-28 LAB — RAPID URINE DRUG SCREEN, HOSP PERFORMED
Barbiturates: NOT DETECTED
Benzodiazepines: NOT DETECTED
Cocaine: NOT DETECTED
Tetrahydrocannabinol: NOT DETECTED

## 2012-05-28 LAB — COMPREHENSIVE METABOLIC PANEL
AST: 2403 U/L — ABNORMAL HIGH (ref 0–37)
Albumin: 3.3 g/dL — ABNORMAL LOW (ref 3.5–5.2)
Calcium: 8.3 mg/dL — ABNORMAL LOW (ref 8.4–10.5)
Chloride: 78 mEq/L — ABNORMAL LOW (ref 96–112)
Creatinine, Ser: 1.63 mg/dL — ABNORMAL HIGH (ref 0.50–1.35)
Total Bilirubin: 0.7 mg/dL (ref 0.3–1.2)

## 2012-05-28 MED ORDER — NICOTINE 21 MG/24HR TD PT24
21.0000 mg | MEDICATED_PATCH | Freq: Every day | TRANSDERMAL | Status: DC
  Administered 2012-05-28 – 2012-05-30 (×3): 21 mg via TRANSDERMAL
  Filled 2012-05-28 (×7): qty 1

## 2012-05-28 MED ORDER — ENOXAPARIN SODIUM 40 MG/0.4ML ~~LOC~~ SOLN
40.0000 mg | SUBCUTANEOUS | Status: DC
  Administered 2012-05-28 – 2012-05-29 (×2): 40 mg via SUBCUTANEOUS
  Filled 2012-05-28 (×2): qty 0.4

## 2012-05-28 MED ORDER — DEXTROSE 50 % IV SOLN
INTRAVENOUS | Status: AC
  Administered 2012-05-28: 50 mL via INTRAVENOUS
  Filled 2012-05-28: qty 50

## 2012-05-28 MED ORDER — ONDANSETRON HCL 4 MG PO TABS: 4.0000 mg | ORAL_TABLET | Freq: Four times a day (QID) | ORAL | Status: DC | PRN

## 2012-05-28 MED ORDER — LORAZEPAM 1 MG PO TABS: 1.0000 mg | ORAL_TABLET | Freq: Four times a day (QID) | ORAL | Status: DC | PRN

## 2012-05-28 MED ORDER — LORAZEPAM 1 MG PO TABS
0.0000 mg | ORAL_TABLET | Freq: Four times a day (QID) | ORAL | Status: DC
  Administered 2012-05-28 – 2012-05-29 (×3): 1 mg via ORAL
  Filled 2012-05-28 (×3): qty 1

## 2012-05-28 MED ORDER — FOLIC ACID 1 MG PO TABS
1.0000 mg | ORAL_TABLET | Freq: Every day | ORAL | Status: DC
  Administered 2012-05-28 – 2012-06-08 (×12): 1 mg via ORAL
  Filled 2012-05-28 (×12): qty 1

## 2012-05-28 MED ORDER — HYDROMORPHONE HCL PF 1 MG/ML IJ SOLN
1.0000 mg | Freq: Once | INTRAMUSCULAR | Status: AC
  Administered 2012-05-28: 1 mg via INTRAVENOUS
  Filled 2012-05-28: qty 1

## 2012-05-28 MED ORDER — DEXTROSE 50 % IV SOLN
50.0000 mL | Freq: Once | INTRAVENOUS | Status: AC
  Administered 2012-05-28: 50 mL via INTRAVENOUS

## 2012-05-28 MED ORDER — SODIUM CHLORIDE 0.9 % IV SOLN: INTRAVENOUS | Status: AC

## 2012-05-28 MED ORDER — SODIUM CHLORIDE 0.9 % IV SOLN
INTRAVENOUS | Status: DC
  Administered 2012-05-28 – 2012-05-29 (×4): via INTRAVENOUS
  Administered 2012-05-30: 150 mL via INTRAVENOUS

## 2012-05-28 MED ORDER — INSULIN ASPART 100 UNIT/ML IV SOLN
5.0000 [IU] | Freq: Once | INTRAVENOUS | Status: AC
  Administered 2012-05-28: 5 [IU] via INTRAVENOUS

## 2012-05-28 MED ORDER — LORAZEPAM 1 MG PO TABS: 0.0000 mg | ORAL_TABLET | Freq: Two times a day (BID) | ORAL | Status: DC

## 2012-05-28 MED ORDER — ADULT MULTIVITAMIN W/MINERALS CH
1.0000 | ORAL_TABLET | Freq: Every day | ORAL | Status: DC
  Administered 2012-05-28 – 2012-06-08 (×12): 1 via ORAL
  Filled 2012-05-28 (×12): qty 1

## 2012-05-28 MED ORDER — SODIUM POLYSTYRENE SULFONATE 15 GM/60ML PO SUSP
30.0000 g | Freq: Once | ORAL | Status: AC
  Administered 2012-05-28: 30 g via ORAL
  Filled 2012-05-28: qty 120

## 2012-05-28 MED ORDER — VITAMIN B-1 100 MG PO TABS
100.0000 mg | ORAL_TABLET | Freq: Every day | ORAL | Status: DC
  Administered 2012-05-28 – 2012-06-08 (×12): 100 mg via ORAL
  Filled 2012-05-28 (×12): qty 1

## 2012-05-28 MED ORDER — HYDROMORPHONE HCL PF 1 MG/ML IJ SOLN
1.0000 mg | INTRAMUSCULAR | Status: DC | PRN
  Administered 2012-05-28 – 2012-05-29 (×6): 1 mg via INTRAVENOUS
  Filled 2012-05-28 (×6): qty 1

## 2012-05-28 MED ORDER — SODIUM CHLORIDE 0.9 % IV BOLUS (SEPSIS)
1000.0000 mL | Freq: Once | INTRAVENOUS | Status: AC
  Administered 2012-05-28: 1000 mL via INTRAVENOUS

## 2012-05-28 MED ORDER — THIAMINE HCL 100 MG/ML IJ SOLN: 100.0000 mg | Freq: Every day | INTRAMUSCULAR | Status: DC

## 2012-05-28 MED ORDER — SODIUM CHLORIDE 0.9 % IJ SOLN
3.0000 mL | Freq: Two times a day (BID) | INTRAMUSCULAR | Status: DC
  Administered 2012-05-28 – 2012-05-29 (×2): 3 mL via INTRAVENOUS
  Filled 2012-05-28: qty 3

## 2012-05-28 MED ORDER — ONDANSETRON HCL 4 MG/2ML IJ SOLN
4.0000 mg | Freq: Four times a day (QID) | INTRAMUSCULAR | Status: DC | PRN
  Administered 2012-06-02 (×2): 4 mg via INTRAVENOUS
  Filled 2012-05-28 (×2): qty 2

## 2012-05-28 MED ORDER — LORAZEPAM 2 MG/ML IJ SOLN: 1.0000 mg | Freq: Four times a day (QID) | INTRAMUSCULAR | Status: DC | PRN

## 2012-05-28 NOTE — ED Notes (Signed)
CRITICAL VALUE ALERT  Critical value received:  Na+ 118  Date of notification:  05/28/12  Time of notification:  1601  Critical value read back: yes  Nurse who received alert:  A. Dareen Piano, RN  MD notified (1st page):  Dr. Judd Lien  Time of first page:  1603  MD notified (2nd page):  Time of second page:  Responding MD:  Dr. Judd Lien  Time MD responded:  [phone]

## 2012-05-28 NOTE — ED Notes (Signed)
EMS reports pt has chronic  Neurological condition.  C/O pain in both arms and legs.  Reports usually is able to walk but today can't stand up to go to the bathroom.

## 2012-05-28 NOTE — ED Provider Notes (Addendum)
History    This chart was scribed for Ryan Lyons, MD, MD by Smitty Pluck. The patient was seen in room APA15 and the patient's care was started at 2:27PM.   CSN: [id number]  Arrival date & time 05/28/12  1252   First MD Initiated Contact with Patient 05/28/12 1427      Chief Complaint  Patient presents with  . Leg Pain  . Arm Pain    (Consider location/radiation/quality/duration/timing/severity/associated sxs/prior treatment) The history is provided by the patient. No language interpreter was used.   [name] is a 46 y.o. male who presents to the Emergency Department BIB EMS with hx of myositis complaining of constant, severe bilateral leg pain and bilateral arm pain onset today. Pt reports that his muscles feel tight and that he is unable to walk due to leg pain. He thinks the current symptoms are due to myositis. Pt has hx of alcohol abuse but reports that he has not used alcohol since last being in ED. Pt denies fever, n/v/d and cough.   Pt does not have PCP   Past Medical History  Diagnosis Date  . Hepatitis C   . Myositis     Left deltoid biopsy at [facility] 10/11/11.  Marland Kitchen Cirrhosis   . Chronic pain syndrome   . 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 Alcohol abuse     Psychiatric admissions for alcohol and drug abuse  . History of cocaine abuse   . Depression     Multiple psychiatric admissions  . Tobacco abuse   . Tattoos   . Low TSH level January 2013    Normal free T4 of 1.06  . Back pain, chronic     Past Surgical History  Procedure Date  . Hernia repair   . Muscle biopsy   . Cardiac catheterization June 2006    Normal coronary arteries    No family history on file.  History  Substance Use Topics  . Smoking status: Current Everyday Smoker -- 1.0 packs/day  . Smokeless tobacco: Not on file  . Alcohol Use: 58.8 oz/week    48 Cans of beer, 50 Shots of liquor per week     Pt drinks heavily      Review of Systems  All other systems reviewed and are  negative.  10 Systems reviewed and all are negative for acute change except as noted in the HPI.   Allergies  Acetaminophen  Home Medications   Current Outpatient Rx  Name Route Sig Dispense Refill  . IBUPROFEN 200 MG PO TABS Oral Take 400 mg by mouth every 6 (six) hours as needed. Pain    . ADULT MULTIVITAMIN W/MINERALS CH Oral Take 1 tablet by mouth daily.      BP 151/104  Pulse 106  Temp 98.3 F (36.8 C) (Oral)  Resp 20  Ht 5\' 8"  (1.727 m)  Wt 143 lb (64.864 kg)  BMI 21.74 kg/m2  SpO2 97%  Physical Exam  Nursing note and vitals reviewed. Constitutional: He is oriented to person, place, and time. He appears well-developed and well-nourished. No distress.  HENT:  Head: Normocephalic and atraumatic.  Eyes: EOM are normal. Pupils are equal, round, and reactive to light.  Neck: Normal range of motion. Neck supple. No tracheal deviation present.  Cardiovascular: Normal rate, regular rhythm, normal heart sounds and intact distal pulses.   Pulmonary/Chest: Effort normal. No respiratory distress.  Abdominal: Soft. He exhibits no distension.  Musculoskeletal: He exhibits tenderness (calfs).  calf appear nl  Neurological: He is alert and oriented to person, place, and time.  Skin: Skin is warm and dry.  Psychiatric: He has a normal mood and affect. His behavior is normal.    ED Course  Procedures (including critical care time) DIAGNOSTIC STUDIES: Oxygen Saturation is 97% on room air, normal by my interpretation.    COORDINATION OF CARE: 2:32 PM Discussed pt ED treatment with pt  2:34 PM Ordered:   Medications  ibuprofen (ADVIL,MOTRIN) 200 MG tablet (not administered)  Multiple Vitamin (MULTIVITAMIN WITH MINERALS) TABS (not administered)  sodium chloride 0.9 % bolus 1,000 mL (1000 mL Intravenous Given 05/28/12 1502)  HYDROmorphone (DILAUDID) injection 1 mg (1 mg Intravenous Given 05/28/12 1503)       Labs Reviewed  CBC WITH DIFFERENTIAL - Abnormal; Notable for  the following:    WBC 15.2 (*)     Hemoglobin 18.0 (*)     MCHC 37.4 (*)     All other components within normal limits  COMPREHENSIVE METABOLIC PANEL - Abnormal; Notable for the following:    Potassium 5.9 (*)     Chloride 78 (*)     Glucose, Bld 101 (*)     Creatinine, Ser 1.63 (*)     Calcium 8.3 (*)     Albumin 3.3 (*)     AST 2403 (*)     ALT 415 (*)     GFR calc non Af Amer 49 (*)     GFR calc Af Amer 57 (*)     All other components within normal limits  CK - Abnormal; Notable for the following:    Total CK >50000 (*)     All other components within normal limits  ETHANOL  URINE RAPID DRUG SCREEN (HOSP PERFORMED)  URINALYSIS, ROUTINE W REFLEX MICROSCOPIC  SEDIMENTATION RATE   No results found.   No diagnosis found.   Date: 05/28/2012  Rate: 80's  Rhythm: normal sinus rhythm  QRS Axis: normal  Intervals: normal  ST/T Wave abnormalities: Peaked T's.  Conduction Disutrbances:none  Narrative Interpretation:   Old EKG Reviewed: none available    MDM  The patient has a pmh of myositis and presents here with severe pain in both calves.  He states he cannot walk due to the pain.  The labs show a markedly elevated ck and acute renal failure consistent with rhabdomyolysis.  He is also hyponatremic and hyperkalemic.  He was hydrated with normal saline.  Internal medicine will be consulted for admission.    Also, an ekg was performed showing peaked T's and was given insulin and glucose.     I personally performed the services described in this documentation, which was scribed in my presence. The recorded information has been reviewed and considered.         Ryan Lyons, MD 05/28/12 1642  Ryan Lyons, MD 05/28/12 1650

## 2012-05-28 NOTE — H&P (Signed)
[facility] History and Physical  [name] YNW:[id number] DOB: Jun 12, 1966 DOA: 05/28/2012  Referring physician: Dr. Judd Lien PCP: No primary provider on file.   Chief Complaint: leg pain  HPI: [name] is a 46 y.o. 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 Patient presents to the emergency room with complaints of bilateral lower extremity pain. He's had this pain on and off for many many years now. He reports his current episode started approximately 2 weeks ago progressively getting worse. He has pain primarily in his calves bilaterally and also in his thighs. He also has pain in his upper extremities mostly his forearms and hands but also in his upper arms as well. His pain got to the point where he was practically unable to walk. He denies any other recent vomiting, diarrhea, abdominal pain, fever, shortness of breath or cough. He was evaluated in the emergency room where he was found to have an elevated creatine kinase 50,000 as well as an elevated creatinine of 1.6. He had multiple other metabolic derangements. He's not on any prescribed medication at home. The only thing he takes is ibuprofen and a multivitamin. He denies any other over-the-counter agents or herbal medications. He's not had any recent trauma. He has not noted any swelling in his extremities. He's not had any fever or evidence of a viral syndrome. He has not used any illicit drugs. He's not had prolonged exposure in the sun/heat exhaustion. Patient will be admitted for further treatments.  Review of Systems: The patient denies anorexia, fever, weight loss,, vision loss, decreased hearing, hoarseness, chest pain, syncope, dyspnea on exertion, peripheral edema, balance deficits, hemoptysis, abdominal pain, melena, hematochezia, severe indigestion/heartburn, hematuria, incontinence, genital sores, suspicious skin lesions, transient blindness, depression,  unusual weight change, abnormal bleeding, enlarged lymph nodes, angioedema, and breast masses.    Past Medical History  Diagnosis Date  . Hepatitis C   . Myositis     Left deltoid biopsy at [facility] 10/11/11.  Marland Kitchen Cirrhosis   . Chronic pain syndrome   . 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 Alcohol abuse     Psychiatric admissions for alcohol and drug abuse  . History of cocaine abuse   . Depression     Multiple psychiatric admissions  . Tobacco abuse   . Tattoos   . Low TSH level January 2013    Normal free T4 of 1.06  . Back pain, chronic    Past Surgical History  Procedure Date  . Hernia repair   . Muscle biopsy   . Cardiac catheterization June 2006    Normal coronary arteries   Social History:  reports that he has been smoking.  He does not have any smokeless tobacco history on file. He reports that he drinks about 58.8 ounces of alcohol per week. He reports that he uses illicit drugs (Cocaine). Lives independently. He reports not using cocaine since before his last admission. He continues to drink alcohol regularly. His last drink of beer was earlier this morning.  Allergies  Allergen Reactions  . Acetaminophen Other (See Comments)    Liver disease     Family history: No history of any myopathies in the family   Prior to Admission medications   Medication Sig Start Date End Date Taking? Authorizing Provider  ibuprofen (ADVIL,MOTRIN) 200 MG tablet Take 400 mg by mouth every 6 (six) hours as needed. Pain   Yes Historical Provider, MD  Multiple Vitamin (MULTIVITAMIN WITH  MINERALS) TABS Take 1 tablet by mouth daily.   Yes Historical Provider, MD   Physical Exam: Filed Vitals:   05/28/12 1434 05/28/12 1651 05/28/12 1823 05/28/12 1900  BP: 147/88 151/98 153/90 150/89  Pulse: 105 112 105 108  Temp:   98.9 F (37.2 C) 98 F (36.7 C)  TempSrc:   Oral Oral  Resp: 16 21  18   Height:    5\' 8"  (1.727 m)  Weight:    62.2 kg (137 lb 2 oz)  SpO2: 98% 99% 99% 96%     General:  No  acute distress  Eyes: Pupils are equal and reactive to light  ENT: Mucous membranes are dry, no pharyngeal erythema  Neck: Supple  Cardiovascular: S1, S2, regular rate and rhythm, peripheral pulses are intact, capillary refill is good  Respiratory: Clear to auscultation bilaterally  Abdomen: Soft, nontender, nondistended, bowel sounds are active  Skin: No visible rashes  Musculoskeletal: No significant tenderness noted in his muscles in upper and lower extremities. No significant erythema or swelling noted in his calves.  Psychiatric: Normal affect, cooperative with exam  Neurologic: Grossly intact, nonfocal  Labs on Admission:  Basic Metabolic Panel:  Lab 05/28/12 1610  NA 118*  K 5.9*  CL 78*  CO2 24  GLUCOSE 101*  BUN 19  CREATININE 1.63*  CALCIUM 8.3*  MG --  PHOS --   Liver Function Tests:  Lab 05/28/12 1452  AST 2403*  ALT 415*  ALKPHOS 51  BILITOT 0.7  PROT 6.8  ALBUMIN 3.3*   No results found for this basename: LIPASE:5,AMYLASE:5 in the last 168 hours No results found for this basename: AMMONIA:5 in the last 168 hours CBC:  Lab 05/28/12 1452  WBC 15.2*  NEUTROABS 11.8*  HGB 18.0*  HCT 48.1  MCV 85.1  PLT 279   Cardiac Enzymes:  Lab 05/28/12 1452  CKTOTAL >50000*  CKMB --  CKMBINDEX --  TROPONINI --    BNP (last 3 results) No results found for this basename: PROBNP:3 in the last 8760 hours CBG: No results found for this basename: GLUCAP:5 in the last 168 hours  Radiological Exams on Admission: No results found.  EKG: Independently reviewed. Peak T waves which are consistent with prior EKG  Assessment/Plan Active Problems:  Hepatitis C  Alcohol abuse  Chronic pain syndrome  Hyperkalemia  Rhabdomyolysis  Acute renal failure  Hyponatremia  Elevated liver enzymes   1. Rhabdomyolysis. Unclear etiology. Patient is not on any statins or any other antipsychotics. There is questionable history of myositis, although ESR was found  to be normal. Patient was seen at [facility] in the past and reports receiving IV steroids. We will request records from [facility]. For now we will aggressively hydrate the patient and monitor his renal function as well as CK levels. Will check TSH, ESR, CRP. We'll also check venous Dopplers to rule out any underlying DVTs in lower extremities. 2. Hyponatremia. Check urine sodium and serum osmolarity. Possibly due to volume depletion. 3. Acute renal failure. Secondary to #1. Continue to aggressively hydrate with IV fluids. If his creatinine worsens, may need to get nephrology involved. 4. Elevated liver enzymes. Likely related to rhabdomyolysis, although the patient does have a history of hep C. We'll continue to follow closely 5. Hyperkalemia. Hopefully should improve with IV fluids, will also give Kayexalate 6. Alcohol abuse. We will place the patient on CIWA protocol  Code Status: Full code Family Communication: Discussed with patient, no family present Disposition Plan: Plan  will be to discharge home once he is medically improved  Time spent: 60 minutes  Jacqulyn Barresi [facility] Pager [phone]  If 7PM-7AM, please contact night-coverage [email or web address] Password [email or web address] 05/28/2012, 7:20 PM

## 2012-05-28 NOTE — ED Notes (Signed)
Weak bilateral grip in both hands. Weak dorsi and plantar flexion in lower extremities. Able to wiggle toes. Patient states that pain intensifies with movement. Also states that he has been trying to use alcohol as a pain killer.

## 2012-05-29 ENCOUNTER — Inpatient Hospital Stay (HOSPITAL_COMMUNITY): Payer: Medicare Other

## 2012-05-29 DIAGNOSIS — N19 Unspecified kidney failure: Secondary | ICD-10-CM | POA: Diagnosis not present

## 2012-05-29 DIAGNOSIS — R7989 Other specified abnormal findings of blood chemistry: Secondary | ICD-10-CM

## 2012-05-29 DIAGNOSIS — M79609 Pain in unspecified limb: Secondary | ICD-10-CM | POA: Diagnosis not present

## 2012-05-29 LAB — CBC
Hemoglobin: 16 g/dL (ref 13.0–17.0)
Platelets: 199 10*3/uL (ref 150–400)
RBC: 5.14 MIL/uL (ref 4.22–5.81)
WBC: 12.2 10*3/uL — ABNORMAL HIGH (ref 4.0–10.5)

## 2012-05-29 LAB — C-REACTIVE PROTEIN: CRP: 17 mg/dL — ABNORMAL HIGH (ref <0.60)

## 2012-05-29 LAB — OSMOLALITY: Osmolality: 265 mOsm/kg — ABNORMAL LOW (ref 275–300)

## 2012-05-29 LAB — TSH: TSH: 3.25 u[IU]/mL (ref 0.350–4.500)

## 2012-05-29 LAB — COMPREHENSIVE METABOLIC PANEL
BUN: 24 mg/dL — ABNORMAL HIGH (ref 6–23)
CO2: 22 mEq/L (ref 19–32)
Calcium: 7 mg/dL — ABNORMAL LOW (ref 8.4–10.5)
Chloride: 89 mEq/L — ABNORMAL LOW (ref 96–112)
Creatinine, Ser: 2.59 mg/dL — ABNORMAL HIGH (ref 0.50–1.35)
GFR calc non Af Amer: 28 mL/min — ABNORMAL LOW (ref >90)
Total Bilirubin: 0.4 mg/dL (ref 0.3–1.2)

## 2012-05-29 LAB — SODIUM, URINE, RANDOM: Sodium, Ur: 18 mEq/L

## 2012-05-29 MED ORDER — ADULT MULTIVITAMIN W/MINERALS CH: 1.0000 | ORAL_TABLET | Freq: Every day | ORAL | Status: DC

## 2012-05-29 MED ORDER — OXYCODONE HCL 5 MG PO TABS
5.0000 mg | ORAL_TABLET | Freq: Four times a day (QID) | ORAL | Status: DC | PRN
  Administered 2012-05-29: 5 mg via ORAL
  Administered 2012-05-29 – 2012-06-02 (×10): 10 mg via ORAL
  Administered 2012-06-03: 5 mg via ORAL
  Administered 2012-06-03 – 2012-06-05 (×5): 10 mg via ORAL
  Administered 2012-06-05: 5 mg via ORAL
  Administered 2012-06-06 – 2012-06-08 (×8): 10 mg via ORAL
  Filled 2012-05-29 (×10): qty 2
  Filled 2012-05-29: qty 1
  Filled 2012-05-29 (×2): qty 2
  Filled 2012-05-29: qty 1
  Filled 2012-05-29 (×5): qty 2
  Filled 2012-05-29: qty 1
  Filled 2012-05-29 (×7): qty 2

## 2012-05-29 MED ORDER — LORAZEPAM 2 MG/ML IJ SOLN
0.0000 mg | Freq: Two times a day (BID) | INTRAMUSCULAR | Status: AC
  Administered 2012-06-01: 2 mg via INTRAVENOUS
  Administered 2012-06-01: 1 mg via INTRAVENOUS
  Filled 2012-05-29 (×2): qty 1

## 2012-05-29 MED ORDER — VITAMIN B-1 100 MG PO TABS: 100.0000 mg | ORAL_TABLET | Freq: Every day | ORAL | Status: DC

## 2012-05-29 MED ORDER — LORAZEPAM 2 MG/ML IJ SOLN
0.0000 mg | Freq: Four times a day (QID) | INTRAMUSCULAR | Status: AC
  Administered 2012-05-30 (×2): 2 mg via INTRAVENOUS
  Filled 2012-05-29: qty 1

## 2012-05-29 MED ORDER — THIAMINE HCL 100 MG/ML IJ SOLN: 100.0000 mg | Freq: Every day | INTRAMUSCULAR | Status: DC

## 2012-05-29 MED ORDER — HYDROMORPHONE HCL PF 1 MG/ML IJ SOLN
1.0000 mg | Freq: Four times a day (QID) | INTRAMUSCULAR | Status: DC | PRN
  Administered 2012-05-29 – 2012-06-08 (×45): 1 mg via INTRAVENOUS
  Filled 2012-05-29: qty 6
  Filled 2012-05-29 (×2): qty 1
  Filled 2012-05-29: qty 6
  Filled 2012-05-29: qty 1
  Filled 2012-05-29: qty 12
  Filled 2012-05-29 (×3): qty 1
  Filled 2012-05-29: qty 12
  Filled 2012-05-29: qty 6
  Filled 2012-05-29: qty 1
  Filled 2012-05-29: qty 3
  Filled 2012-05-29 (×6): qty 1
  Filled 2012-05-29: qty 6
  Filled 2012-05-29 (×5): qty 1
  Filled 2012-05-29 (×2): qty 6
  Filled 2012-05-29: qty 1
  Filled 2012-05-29: qty 6
  Filled 2012-05-29: qty 12
  Filled 2012-05-29 (×2): qty 6
  Filled 2012-05-29 (×2): qty 1
  Filled 2012-05-29: qty 8
  Filled 2012-05-29: qty 6
  Filled 2012-05-29: qty 8
  Filled 2012-05-29 (×4): qty 1
  Filled 2012-05-29: qty 6
  Filled 2012-05-29 (×2): qty 1
  Filled 2012-05-29: qty 6
  Filled 2012-05-29: qty 1
  Filled 2012-05-29: qty 12
  Filled 2012-05-29: qty 6
  Filled 2012-05-29 (×3): qty 1

## 2012-05-29 MED ORDER — FOLIC ACID 1 MG PO TABS: 1.0000 mg | ORAL_TABLET | Freq: Every day | ORAL | Status: DC

## 2012-05-29 MED ORDER — SODIUM CHLORIDE 0.9 % IJ SOLN
INTRAMUSCULAR | Status: AC
  Administered 2012-05-29: 10:00:00
  Filled 2012-05-29: qty 3

## 2012-05-29 MED ORDER — SODIUM CHLORIDE 0.9 % IJ SOLN
INTRAMUSCULAR | Status: AC
  Administered 2012-05-29: 13:00:00
  Filled 2012-05-29: qty 3

## 2012-05-29 MED ORDER — LORAZEPAM 2 MG/ML IJ SOLN
1.0000 mg | Freq: Four times a day (QID) | INTRAMUSCULAR | Status: AC | PRN
  Administered 2012-05-31 (×2): 1 mg via INTRAVENOUS
  Filled 2012-05-29 (×3): qty 1

## 2012-05-29 MED ORDER — LORAZEPAM 1 MG PO TABS
1.0000 mg | ORAL_TABLET | Freq: Four times a day (QID) | ORAL | Status: AC | PRN
  Administered 2012-05-29: 1 mg via ORAL
  Filled 2012-05-29: qty 1

## 2012-05-29 NOTE — Progress Notes (Signed)
[facility] PROGRESS NOTE  [name] BJY:[id number] DOB: October 10, 1965 DOA: 05/28/2012 PCP: No primary provider on file.  Assessment/Plan: Active Problems:  Hepatitis C  Alcohol abuse  Chronic pain syndrome  Hyperkalemia  Rhabdomyolysis  Acute renal failure  Hyponatremia  Elevated liver enzymes  1. Rhabdomyolysis. CKs remain elevated. Patient on aggressive IV hydration. Unclear as to the source of rhabdomyolysis. Records requested from [facility], will review once available. If patient does have a history of inflammatory myopathy, then steroids would be indicated. CRP and TSH is pending. 2. Acute renal failure. Renal function is worse today. Patient reports that he is still making urine. Unfortunately this has not been documented in the chart. We'll request strict ins and outs. Have also requested a nephrology consultation 3. Hyponatremia. Improving with IV fluids 4. Alcohol abuse. Patient is on alcohol withdrawal protocol. He is somewhat tachycardic but does not have any tremors or other signs of withdrawal at this time. We'll continue to follow. 5. Hyperkalemia. Improved 6. Chronic pain syndrome 7. Elevated liver enzymes, likely related to rhabdomyolysis, improving with IV fluids  Code Status: full code Family Communication: discussed with patient, no family at bedside Disposition Plan: discharge home once medically stable   Brief narrative: Discharge was brought to the emergency room with complaints of lower leg pain. Patient had bilateral lower extremity pain which is intermittent and chronic in nature. His current episode started approximately 2 weeks ago it has progressively gotten worse. Labs revealed an elevated creatine kinase greater than 50,000 and an elevated creatinine. Patient was referred for admission  Consultants:  Nephrology  Procedures:  None  Antibiotics:  None  HPI/Subjective: Has continued pain in lower extremities as well as upper extremities.  Feels weak in his upper or lower extremities as well. He reports that he is making urine.  Objective: Filed Vitals:   05/28/12 2118 05/29/12 0200 05/29/12 0521 05/29/12 0553  BP: 144/92 135/92 164/93   Pulse: 107 103 118   Temp: 98 F (36.7 C) 98.2 F (36.8 C) 98.1 F (36.7 C)   TempSrc: Oral Oral Oral   Resp: 20 20 22    Height:      Weight:    63.4 kg (139 lb 12.4 oz)  SpO2: 95% 96% 92%     Intake/Output Summary (Last 24 hours) at 05/29/12 1205 Last data filed at 05/29/12 0600  Gross per 24 hour  Intake 2965.5 ml  Output      1 ml  Net 2964.5 ml   Filed Weights   05/28/12 1255 05/28/12 1900 05/29/12 0553  Weight: 64.864 kg (143 lb) 62.2 kg (137 lb 2 oz) 63.4 kg (139 lb 12.4 oz)    Exam:   General:  No acute distress  Cardiovascular: S1, S2, tachycardic, no peripheral edema  Respiratory: Clear to auscultation bilaterally  Abdomen: S1, S2, regular rate and rhythm  Neuro: Alert and oriented x3, no tremors  Data Reviewed: Basic Metabolic Panel:  Lab 05/29/12 0865 05/28/12 1452  NA 126* 118*  K 4.4 5.9*  CL 89* 78*  CO2 22 24  GLUCOSE 96 101*  BUN 24* 19  CREATININE 2.59* 1.63*  CALCIUM 7.0* 8.3*  MG -- --  PHOS -- --   Liver Function Tests:  Lab 05/29/12 0508 05/28/12 1452  AST 1941* 2403*  ALT 359* 415*  ALKPHOS 44 51  BILITOT 0.4 0.7  PROT 5.4* 6.8  ALBUMIN 2.4* 3.3*   No results found for this basename: LIPASE:5,AMYLASE:5 in the last 168 hours No  results found for this basename: AMMONIA:5 in the last 168 hours CBC:  Lab 05/29/12 0508 05/28/12 1452  WBC 12.2* 15.2*  NEUTROABS -- 11.8*  HGB 16.0 18.0*  HCT 44.7 48.1  MCV 87.0 85.1  PLT 199 279   Cardiac Enzymes:  Lab 05/29/12 0508 05/28/12 1452  CKTOTAL >50000* >50000*  CKMB -- --  CKMBINDEX -- --  TROPONINI -- --   BNP (last 3 results) No results found for this basename: PROBNP:3 in the last 8760 hours CBG: No results found for this basename: GLUCAP:5 in the last 168  hours  No results found for this or any previous visit (from the past 240 hour(s)).   Studies: No results found.  Scheduled Meds:   . sodium chloride   Intravenous STAT  . dextrose  50 mL Intravenous Once  . enoxaparin (LOVENOX) injection  40 mg Subcutaneous Q24H  . folic acid  1 mg Oral Daily  .  HYDROmorphone (DILAUDID) injection  1 mg Intravenous Once  . insulin aspart  5 Units Intravenous Once  . LORazepam  0-4 mg Oral Q6H   Followed by  . LORazepam  0-4 mg Oral Q12H  . multivitamin with minerals  1 tablet Oral Daily  . nicotine  21 mg Transdermal Daily  . sodium chloride  1,000 mL Intravenous Once  . sodium chloride  1,000 mL Intravenous Once  . sodium chloride  1,000 mL Intravenous Once  . sodium chloride  3 mL Intravenous Q12H  . sodium chloride      . sodium polystyrene  30 g Oral Once  . thiamine  100 mg Oral Daily   Or  . thiamine  100 mg Intravenous Daily   Continuous Infusions:   . sodium chloride 150 mL/hr at 05/29/12 1023    Active Problems:  Hepatitis C  Alcohol abuse  Chronic pain syndrome  Hyperkalemia  Rhabdomyolysis  Acute renal failure  Hyponatremia  Elevated liver enzymes    Time spent:    [name]  [facility] Pager [phone]. If 7PM-7AM, please contact night-coverage at [email or web address], password [email or web address] 05/29/2012, 12:05 PM  LOS: 1 day

## 2012-05-29 NOTE — Clinical Social Work Psychosocial (Signed)
Clinical Social Work Department BRIEF PSYCHOSOCIAL ASSESSMENT 05/29/2012  Patient:  [name], [name]     Account Number:  [id number]     Admit date:  05/28/2012  Clinical Social Worker:  Nancie Neas  Date/Time:  05/29/2012 11:15 AM  Referred by:  RN  Date Referred:  05/29/2012 Referred for  Substance Abuse   Other Referral:   Interview type:  Patient Other interview type:    PSYCHOSOCIAL DATA Living Status:  FAMILY Admitted from facility:   Level of care:   Primary support name:  Bonita Quin Primary support relationship to patient:  PARENT Degree of support available:   supportive per pt    CURRENT CONCERNS Current Concerns  Substance Abuse   Other Concerns:    SOCIAL WORK ASSESSMENT / PLAN CSW met with pt at bedside following referral from progression for substance abuse. Pt alert and oriented and reports he lives at home with his mother. He states they help each other depending on his pain level that day. Pt has a wife and 3 children who live with his in laws. He indicates this is the best situation for everyone right now. Per pt, he has had chronic pain for 3-4 years primarily in his legs. He is on disability. Pt addressed substance abuse and reports that he drinks about a 6 pack of beer daily. He states, "If I get thirsty, I drink beer. My drug of choice is alcohol." He rarely drinks liquor. Pt admits to "experimenting with cocaine from time to time." He does not truly feel either is a problem for him. He said he was sober for 30 days several months ago as he was prescribed oxycodone and did not feel the need to drink while his pain was controlled. He feels he could quit drinking if he had less pain. Possible pain clinic referral? Pt reports he has been to inpatient and outpatient treatment in the past as well as AA. He did not find these to be helpful and wanted to drink more after treatment. Pt had a DWI in April and states he may go to prison following his court date in October. He  was considering going to [facility] prior to hospitalization for assessment.   Assessment/plan status:  Referral to [facility] Other assessment/ plan:   Information/referral to community resources:   AA list  [facility]  Pain clinic?  CM for PCP    PATIENT'S/FAMILY'S RESPONSE TO PLAN OF CARE: Pt is willing to go to [facility] after d/c. He states he would like to stop drinking in order to be a better father to his children. SBIRT completed. Information left on [facility] and AA meetings in [place]. CSW will sign off unless further needs arise prior to d/c. Pt did request assistance with finding PCP. CM notified.        Derenda Fennel, [place] [phone]

## 2012-05-29 NOTE — Consult Note (Signed)
[name] MRN: [id number] DOB/AGE: [birth year] 46 y.o. Primary Care Physician:No primary provider on file. Admit date: 05/28/2012 Chief Complaint:  Chief Complaint  Patient presents with  . Leg Pain  . Arm Pain   HPI:  Pt is  a 46 y.o. male with a past medical history hepatitis C, cocaine abuse, possible history of polymyositis, chronic pain syndrome who presented to the emergency room with complaints of bilateral lower extremity pain.  HPI dates back to   approximately 2 weeks ago when pt again started having pain  Which got progressively getting worse.  Pt has pain in his calves bilaterally and also in his thighs.  Pt also has pain in his upper extremities . Pt said his pain got very severe and he was  unable to walk.  Pt denies  any c/o of   vomiting, diarrhea, abdominal pain, fever, shortness of breath or cough.  NO c/o hematuria   IN ER he was found to have an elevated creatine kinase 50K  And Creatinine of 1.6. Pt today feels better than yesterday -pt says pain is better   Past Medical History  Diagnosis Date  . Hepatitis C   . Myositis     Left deltoid biopsy at [facility] 10/11/11.  Marland Kitchen Cirrhosis   . Chronic pain syndrome   . 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 Alcohol abuse     Psychiatric admissions for alcohol and drug abuse  . History of cocaine abuse   . Depression     Multiple psychiatric admissions  . Tobacco abuse   . Tattoos   . Low TSH level January 2013    Normal free T4 of 1.06  . Back pain, chronic         History reviewed. No pertinent family history. NO hx of ESRD   Social History:  reports that he has been smoking.  He uses smokeless tobacco. He reports that he drinks about 58.8 ounces of alcohol per week. He reports that he uses illicit drugs (Cocaine).   Allergies:  Allergies  Allergen Reactions  . Acetaminophen Other (See Comments)    Liver disease     Medications Prior to Admission  Medication Sig Dispense Refill  . ibuprofen (ADVIL,MOTRIN) 200  MG tablet Take 400 mg by mouth every 6 (six) hours as needed. Pain      . Multiple Vitamin (MULTIVITAMIN WITH MINERALS) TABS Take 1 tablet by mouth daily.           BMW:UXLKG from the symptoms mentioned above,there are no other symptoms referable to all systems reviewed.     . sodium chloride   Intravenous STAT  . dextrose  50 mL Intravenous Once  . enoxaparin (LOVENOX) injection  40 mg Subcutaneous Q24H  . folic acid  1 mg Oral Daily  . insulin aspart  5 Units Intravenous Once  . LORazepam  0-4 mg Intravenous Q6H   Followed by  . LORazepam  0-4 mg Intravenous Q12H  . multivitamin with minerals  1 tablet Oral Daily  . nicotine  21 mg Transdermal Daily  . sodium chloride  1,000 mL Intravenous Once  . sodium chloride  1,000 mL Intravenous Once  . sodium chloride  3 mL Intravenous Q12H  . sodium chloride      . sodium chloride      . sodium polystyrene  30 g Oral Once  . thiamine  100 mg Oral Daily   Or  . thiamine  100 mg Intravenous Daily  . DISCONTD: folic  acid  1 mg Oral Daily  . DISCONTD: LORazepam  0-4 mg Oral Q6H  . DISCONTD: LORazepam  0-4 mg Oral Q12H  . DISCONTD: multivitamin with minerals  1 tablet Oral Daily  . DISCONTD: thiamine  100 mg Intravenous Daily  . DISCONTD: thiamine  100 mg Oral Daily       Physical Exam: Vital signs in last 24 hours: Temp:  [98 F (36.7 C)-98.9 F (37.2 C)] 98.8 F (37.1 C) (09/11 1336) Pulse Rate:  [103-119] 119  (09/11 1336) Resp:  [18-22] 18  (09/11 1336) BP: (120-164)/(74-98) 120/74 mmHg (09/11 1336) SpO2:  [92 %-99 %] 97 % (09/11 1336) Weight:  [137 lb 2 oz (62.2 kg)-139 lb 12.4 oz (63.4 kg)] 139 lb 12.4 oz (63.4 kg) (09/11 0553) Weight change:  Last BM Date: 05/29/12  Intake/Output from previous day: 09/10 0701 - 09/11 0700 In: 2965.5 [P.O.:600; I.V.:2365.5] Out: 1 [Stool:1]     Physical Exam: General- pt is awake,alert, oriented to time place and person Resp- No acute REsp distress, CTA B/L NO Rhonchi CVS-  S1S2 regular in rate and rhythm GIT- BS+, soft, NT, ND EXT- NO LE Edema, Cyanosis CNS- CN 2-12 grossly intact. Moving all 4 extremities Psych- normal mood and affect    Lab Results: CBC  Basename 05/29/12 0508 05/28/12 1452  WBC 12.2* 15.2*  HGB 16.0 18.0*  HCT 44.7 48.1  PLT 199 279    BMET  Basename 05/29/12 0508 05/28/12 1452  NA 126* 118*  K 4.4 5.9*  CL 89* 78*  CO2 22 24  GLUCOSE 96 101*  BUN 24* 19  CREATININE 2.59* 1.63*  CALCIUM 7.0* 8.3*    Trend  Sodium 118==>126  Creat  1.63==>2.59  CPK 2291==> More than 50K  Ua Large Positive for Blood but 3--6 RBC     Lab Results  Component Value Date   CALCIUM 7.0* 05/29/2012   CAION 1.16 02/05/2010   PHOS 3.6 10/17/2011      Impression: 1)Renal  AKI secondary to Rhabdomyolysis +NSAIDS                 Rhabdo secondary to NON traumatic Muscle injury( Myopathy- ETOH)                Pt is  S/P Muscle biopsy at [facility]                AKI worsening.                 No need of Renal replacement therapy yet                 2)CVS  Hemodynamically stable  3)Anemia HGb at goal . hemo concentrated at the time of presentation.  4)Hyperkalemia secondary to Muscle injury   5)Hypocalcemia secondary to Muscle injury leading to calcium depostion  6)Hyponatremia secondary to Hypovolemic Hyponatremia Hypovolemia secondary to Third spacing   7)Acid base Co2 at goal     Plan:  Will suggest IVF at 150 ml/hr( was running at 49ml/hr at the time of h& p) Will suggest strict I/o Will suggest to get 2 d echo tyo see ef Agree with getting data from [facility] Will check Po4 in am Will suggest to check renal u/s      Iyanna Drummer S 05/29/2012, 3:51 PM

## 2012-05-30 DIAGNOSIS — E875 Hyperkalemia: Secondary | ICD-10-CM | POA: Diagnosis not present

## 2012-05-30 DIAGNOSIS — M6282 Rhabdomyolysis: Secondary | ICD-10-CM | POA: Diagnosis not present

## 2012-05-30 DIAGNOSIS — I4891 Unspecified atrial fibrillation: Secondary | ICD-10-CM | POA: Diagnosis not present

## 2012-05-30 DIAGNOSIS — I509 Heart failure, unspecified: Secondary | ICD-10-CM | POA: Diagnosis not present

## 2012-05-30 DIAGNOSIS — N179 Acute kidney failure, unspecified: Secondary | ICD-10-CM | POA: Diagnosis not present

## 2012-05-30 DIAGNOSIS — R000 Tachycardia, unspecified: Secondary | ICD-10-CM | POA: Diagnosis not present

## 2012-05-30 LAB — COMPREHENSIVE METABOLIC PANEL
AST: 2373 U/L — ABNORMAL HIGH (ref 0–37)
AST: 2447 U/L — ABNORMAL HIGH (ref 0–37)
Albumin: 2.2 g/dL — ABNORMAL LOW (ref 3.5–5.2)
Albumin: 2.3 g/dL — ABNORMAL LOW (ref 3.5–5.2)
Calcium: 7.6 mg/dL — ABNORMAL LOW (ref 8.4–10.5)
Chloride: 89 mEq/L — ABNORMAL LOW (ref 96–112)
Chloride: 88 mEq/L — ABNORMAL LOW (ref 96–112)
Creatinine, Ser: 4.2 mg/dL — ABNORMAL HIGH (ref 0.50–1.35)
Creatinine, Ser: 4.23 mg/dL — ABNORMAL HIGH (ref 0.50–1.35)
Total Bilirubin: 0.3 mg/dL (ref 0.3–1.2)
Total Protein: 5 g/dL — ABNORMAL LOW (ref 6.0–8.3)
Total Protein: 5.3 g/dL — ABNORMAL LOW (ref 6.0–8.3)

## 2012-05-30 LAB — CBC
HCT: 40.3 % (ref 39.0–52.0)
MCHC: 35.2 g/dL (ref 30.0–36.0)
MCV: 87.2 fL (ref 78.0–100.0)
RDW: 13 % (ref 11.5–15.5)
WBC: 11.3 10*3/uL — ABNORMAL HIGH (ref 4.0–10.5)

## 2012-05-30 LAB — HIV ANTIBODY (ROUTINE TESTING W REFLEX): HIV: NONREACTIVE

## 2012-05-30 LAB — CREATININE, URINE, RANDOM: Creatinine, Urine: 34.91 mg/dL

## 2012-05-30 LAB — HEPATITIS B SURFACE ANTIGEN: Hepatitis B Surface Ag: NEGATIVE

## 2012-05-30 LAB — SODIUM, URINE, RANDOM: Sodium, Ur: 51 mEq/L

## 2012-05-30 LAB — C4 COMPLEMENT: Complement C4, Body Fluid: 12 mg/dL — ABNORMAL LOW (ref 10–40)

## 2012-05-30 LAB — C3 COMPLEMENT: C3 Complement: 79 mg/dL — ABNORMAL LOW (ref 90–180)

## 2012-05-30 LAB — PHOSPHORUS: Phosphorus: 5.2 mg/dL — ABNORMAL HIGH (ref 2.3–4.6)

## 2012-05-30 MED ORDER — SODIUM CHLORIDE 0.9 % IV SOLN: INTRAVENOUS | Status: DC

## 2012-05-30 MED ORDER — FUROSEMIDE 10 MG/ML IJ SOLN
100.0000 mg | Freq: Two times a day (BID) | INTRAVENOUS | Status: DC
  Administered 2012-05-30 – 2012-05-31 (×4): 100 mg via INTRAVENOUS
  Filled 2012-05-30 (×9): qty 10

## 2012-05-30 MED ORDER — SODIUM CHLORIDE 0.9 % IV SOLN
INTRAVENOUS | Status: DC
  Administered 2012-05-30 – 2012-05-31 (×3): via INTRAVENOUS
  Filled 2012-05-30 (×14): qty 50

## 2012-05-30 MED ORDER — SODIUM CHLORIDE 0.9 % IJ SOLN
INTRAMUSCULAR | Status: AC
  Administered 2012-05-30: 10 mL
  Filled 2012-05-30: qty 3

## 2012-05-30 MED ORDER — ENOXAPARIN SODIUM 30 MG/0.3ML ~~LOC~~ SOLN
30.0000 mg | SUBCUTANEOUS | Status: DC
  Administered 2012-05-30 – 2012-06-07 (×9): 30 mg via SUBCUTANEOUS
  Filled 2012-05-30 (×9): qty 0.3

## 2012-05-30 NOTE — Progress Notes (Signed)
*  PRELIMINARY RESULTS* Echocardiogram 2D Echocardiogram has been performed.  Nestor Ramp M 05/30/2012, 10:51 AM

## 2012-05-30 NOTE — Progress Notes (Signed)
Subjective: Interval History: has complaints back pain and a generalized pain. He denies any nausea vomiting. Patient complains that he doesn't know what happened to him. As this moment he seems to be feeling bette except the pain..  Objective: Vital signs in last 24 hours: Temp:  [98.6 F (37 C)-99.2 F (37.3 C)] 98.7 F (37.1 C) (09/12 0405) Pulse Rate:  [111-121] 119  (09/12 0405) Resp:  [18-24] 18  (09/12 0405) BP: (120-153)/(72-95) 153/95 mmHg (09/12 0405) SpO2:  [96 %-98 %] 98 % (09/12 0405) Weight:  [68.3 kg (150 lb 9.2 oz)] 68.3 kg (150 lb 9.2 oz) (09/12 0405) Weight change: 3.436 kg (7 lb 9.2 oz)  Intake/Output from previous day: 09/11 0701 - 09/12 0700 In: 3800 [P.O.:720; I.V.:3080] Out: 375 [Urine:375] Intake/Output this shift: Total I/O In: 875 [I.V.:875] Out: -   General appearance: alert, cooperative and no distress Resp: clear to auscultation bilaterally Cardio: regular rate and rhythm, S1, S2 normal, no murmur, click, rub or gallop GI: soft, non-tender; bowel sounds normal; no masses,  no organomegaly Extremities: extremities normal, atraumatic, no cyanosis or edema  Lab Results:  [facility] 05/30/12 0511 05/29/12 0508  WBC 11.3* 12.2*  HGB 14.2 16.0  HCT 40.3 44.7  PLT 152 199   BMET:  Basename 05/30/12 0511 05/29/12 0508  NA 122* 126*  K 4.3 4.4  CL 89* 89*  CO2 22 22  GLUCOSE 91 96  BUN 35* 24*  CREATININE 4.20* 2.59*  CALCIUM 7.6* 7.0*   No results found for this basename: PTH:2 in the last 72 hours Iron Studies: No results found for this basename: IRON,TIBC,TRANSFERRIN,FERRITIN in the last 72 hours  Studies/Results: US Renal  05/29/2012  *RADIOLOGY REPORT*  Clinical Data:  Acute renal failure, there are urine  RENAL/URINARY TRACT ULTRASOUND COMPLETE  Comparison:  None.  Findings:  Right Kidney:  The renal parenchyma is echogenic with respect to the liver and there is increased corticomedullary differentiation. Kidneys are upper limits of  normal for size at 13.2 cm in length. No evidence of mass or hydronephrosis.  Left Kidney:  The renal parenchyma is echogenic and there is increased corticomedullary differentiation.  Upper limit of normal for size at 14 cm in length. No evidence of mass or hydronephrosis.  Bladder:  Appears normal for degree of bladder distention.  IMPRESSION:  1.  Echogenic renal parenchyma bilaterally with increased corticomedullary differentiation suggesting underlying medical renal disease.  Given that the kidneys are at the upper limits of normal for size, HIV nephropathy is a possible differential consideration.  2.  No evidence of hydronephrosis.   Original Report Authenticated By: Vilma Prader    US Venous Img Lower Bilateral  05/29/2012  *RADIOLOGY REPORT*  Clinical Data: Bilateral leg pain, elevated CK enzymes, smoker  BILATERAL LOWER EXTREMITY VENOUS DUPLEX ULTRASOUND  Technique:  Gray-scale sonography with graded compression, as well as color Doppler and duplex ultrasound, were performed to evaluate the deep venous system of both lower extremities from the level of the common femoral vein through the popliteal and proximal calf veins.  Spectral Doppler was utilized to evaluate flow at rest and with distal augmentation maneuvers.  Comparison:  None.  Findings:  Normal compressibility of bilateral common femoral, superficial femoral, and popliteal veins is demonstrated, as well as the visualized proximal calf veins.  No filling defects to suggest DVT on grayscale or color Doppler imaging.  Doppler waveforms show normal direction of venous flow, normal respiratory phasicity and response to augmentation.  Included views of the gastrocnemius  musculature demonstrates strandy edema throughout the muscle fibers bilaterally, worse on the right. This is nonspecific by ultrasound but can be seen with muscle strain, injury, or inflammation/edema.  IMPRESSION: No evidence of deep vein thrombosis in either lower extremity.   Original  Report Authenticated By: Judie Petit. Ruel Favors, M.D.     I have reviewed the patient's current medications.  Assessment/Plan: Problem #1 acute kidney injury was likely secondary to rhabdomyolysis however her patient will has history of her cocaine abuse and hepatitis C other etiologies cannot ruled out. Presently patient is none oliguric however his pending creatinine continued to increase. Problem #2 history of hyperkalemia his potassium has corrected most likely that the part of rhabdomyolysis also. Problem #3 elevated liver function tests possible alcoholic liver disease however patient also is hepatitis C hence it could be infectious also. Problem #4 history of hyponatremia most likely secondary to hypovolemic hyponatremia associated with elevated ADH with hypotonic fluid replacement.  Problem #5 nephromegaly the differential diagnosis includes because of his high risk behavior HIV nephropathy, acute interstitial nephritis/ Problem #6 hepatitis C positive. Problem #7 history of rhabdomyolysis with elevated CPK more than 50,000. Plan: We'll change his IV fluid to to normal saline with 50 mEq of sodium bicarbonate at present right Will the use some diuretics to improve his urine output and alkalinize his urine the last urine pH is 5.5. We'll follow his CPK, LFTs, basic metabolic panel and the we'll send a serum 4 cryo globulin and I will do 24-hour urine for protein.  We'll check complement. We'll check also for HIV.   LOS: 2 days   Averey Trompeter S 05/30/2012,7:47 AM

## 2012-05-30 NOTE — Progress Notes (Signed)
[facility] PROGRESS NOTE  [name] HYQ:[id number] DOB: September 12, 1966 DOA: 05/28/2012 PCP: No primary provider on file.  Assessment/Plan: Active Problems:  Hepatitis C  Alcohol abuse  Chronic pain syndrome  Hyperkalemia  Rhabdomyolysis  Acute renal failure  Hyponatremia  Elevated liver enzymes  1. Rhabdomyolysis. CKs remain elevated. Patient on aggressive IV hydration. Unclear as to the source of rhabdomyolysis. 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  Discussed with wife and she reports that the majority of his prior work up done at [facility]  Will request records from [facility]. If patient does have a history of inflammatory myopathy, then steroids would be indicated. TSH normal, CRP elevated, but sed rate normal.  HIV pending. 2. Acute renal failure. Renal function continues to decline. Appreciate Nephrology input.  Started on lasix, continuing IV fluids.  Renal ultrasound shows medical renal disease. Echo ordered.  3. Hyponatremia. On IV fluids and lasix, serum sodium worse today.  Renal following. 4. Alcohol abuse. Patient is on alcohol withdrawal protocol. He is somewhat tachycardic but does not have any tremors or other signs of withdrawal at this time. We'll continue to follow. 5. Hyperkalemia. Improved 6. Chronic pain syndrome 7. Elevated liver enzymes, likely related to rhabdomyolysis/possibly hep c, cryoglobulins ordered  Code Status: full code Family Communication: discussed with patient, and wife over the phone Disposition Plan: discharge home once medically stable   Brief narrative: This gentleman was brought to the emergency room with complaints of lower leg pain. Patient had bilateral lower extremity pain which is intermittent and chronic in nature. His current episode started approximately 2 weeks ago it has progressively gotten worse. Labs revealed an elevated creatine kinase greater than 50,000 and an elevated creatinine. Patient was referred for  admission  Consultants:  Nephrology  Procedures:  None  Antibiotics:  None  HPI/Subjective: He appears to be sedated from pain meds and ativan. Denies any specific complaints.  Objective: Filed Vitals:   05/29/12 1809 05/29/12 2023 05/30/12 0218 05/30/12 0405  BP: 122/79 124/72 130/74 153/95  Pulse: 121 114 111 119  Temp: 98.6 F (37 C) 99.2 F (37.3 C) 98.9 F (37.2 C) 98.7 F (37.1 C)  TempSrc: Oral Oral Oral Oral  Resp: 24 18 18 18   Height:      Weight:    68.3 kg (150 lb 9.2 oz)  SpO2: 97% 96% 96% 98%    Intake/Output Summary (Last 24 hours) at 05/30/12 1223 Last data filed at 05/30/12 0900  Gross per 24 hour  Intake   4795 ml  Output    375 ml  Net   4420 ml   Filed Weights   05/28/12 1900 05/29/12 0553 05/30/12 0405  Weight: 62.2 kg (137 lb 2 oz) 63.4 kg (139 lb 12.4 oz) 68.3 kg (150 lb 9.2 oz)    Exam:   General:  No acute distress  Cardiovascular: S1, S2, tachycardic, trace edema  Respiratory: Clear to auscultation bilaterally  Abdomen: soft, nt, bs+  Neuro: Alert and oriented x3, no tremors  Data Reviewed: Basic Metabolic Panel:  Lab 05/30/12 9528 05/30/12 0511 05/29/12 0508 05/28/12 1452  NA 121* 122* 126* 118*  K 3.9 4.3 4.4 5.9*  CL 88* 89* 89* 78*  CO2 22 22 22 24   GLUCOSE 106* 91 96 101*  BUN 35* 35* 24* 19  CREATININE 4.23* 4.20* 2.59* 1.63*  CALCIUM 7.9* 7.6* 7.0* 8.3*  MG -- -- -- --  PHOS -- 5.2* -- --   Liver Function Tests:  Lab 05/30/12 0855 05/30/12 0511 05/29/12 0508 05/28/12 1452  AST 2447* 2373* 1941* 2403*  ALT 498* 463* 359* 415*  ALKPHOS 59 52 44 51  BILITOT 0.3 0.2* 0.4 0.7  PROT 5.3* 5.0* 5.4* 6.8  ALBUMIN 2.3* 2.2* 2.4* 3.3*   No results found for this basename: LIPASE:5,AMYLASE:5 in the last 168 hours No results found for this basename: AMMONIA:5 in the last 168 hours CBC:  Lab 05/30/12 0511 05/29/12 0508 05/28/12 1452  WBC 11.3* 12.2* 15.2*  NEUTROABS -- -- 11.8*  HGB 14.2 16.0 18.0*  HCT 40.3  44.7 48.1  MCV 87.2 87.0 85.1  PLT 152 199 279   Cardiac Enzymes:  Lab 05/30/12 0511 05/29/12 0508 05/28/12 1452  CKTOTAL >50000* >50000* >50000*  CKMB -- -- --  CKMBINDEX -- -- --  TROPONINI -- -- --   BNP (last 3 results) No results found for this basename: PROBNP:3 in the last 8760 hours CBG: No results found for this basename: GLUCAP:5 in the last 168 hours  No results found for this or any previous visit (from the past 240 hour(s)).   Studies: US Renal  05/29/2012  *RADIOLOGY REPORT*  Clinical Data:  Acute renal failure, there are urine  RENAL/URINARY TRACT ULTRASOUND COMPLETE  Comparison:  None.  Findings:  Right Kidney:  The renal parenchyma is echogenic with respect to the liver and there is increased corticomedullary differentiation. Kidneys are upper limits of normal for size at 13.2 cm in length. No evidence of mass or hydronephrosis.  Left Kidney:  The renal parenchyma is echogenic and there is increased corticomedullary differentiation.  Upper limit of normal for size at 14 cm in length. No evidence of mass or hydronephrosis.  Bladder:  Appears normal for degree of bladder distention.  IMPRESSION:  1.  Echogenic renal parenchyma bilaterally with increased corticomedullary differentiation suggesting underlying medical renal disease.  Given that the kidneys are at the upper limits of normal for size, HIV nephropathy is a possible differential consideration.  2.  No evidence of hydronephrosis.   Original Report Authenticated By: Vilma Prader    US Venous Img Lower Bilateral  05/29/2012  *RADIOLOGY REPORT*  Clinical Data: Bilateral leg pain, elevated CK enzymes, smoker  BILATERAL LOWER EXTREMITY VENOUS DUPLEX ULTRASOUND  Technique:  Gray-scale sonography with graded compression, as well as color Doppler and duplex ultrasound, were performed to evaluate the deep venous system of both lower extremities from the level of the common femoral vein through the popliteal and proximal calf veins.   Spectral Doppler was utilized to evaluate flow at rest and with distal augmentation maneuvers.  Comparison:  None.  Findings:  Normal compressibility of bilateral common femoral, superficial femoral, and popliteal veins is demonstrated, as well as the visualized proximal calf veins.  No filling defects to suggest DVT on grayscale or color Doppler imaging.  Doppler waveforms show normal direction of venous flow, normal respiratory phasicity and response to augmentation.  Included views of the gastrocnemius musculature demonstrates strandy edema throughout the muscle fibers bilaterally, worse on the right. This is nonspecific by ultrasound but can be seen with muscle strain, injury, or inflammation/edema.  IMPRESSION: No evidence of deep vein thrombosis in either lower extremity.   Original Report Authenticated By: Judie Petit. Ruel Favors, M.D.     Scheduled Meds:    . sodium chloride   Intravenous STAT  . enoxaparin (LOVENOX) injection  30 mg Subcutaneous Q24H  . folic acid  1 mg Oral Daily  . furosemide  100 mg Intravenous BID  .  LORazepam  0-4 mg Intravenous Q6H   Followed by  . LORazepam  0-4 mg Intravenous Q12H  . multivitamin with minerals  1 tablet Oral Daily  . nicotine  21 mg Transdermal Daily  . sodium chloride      . thiamine  100 mg Oral Daily   Or  . thiamine  100 mg Intravenous Daily  . DISCONTD: enoxaparin (LOVENOX) injection  40 mg Subcutaneous Q24H  . DISCONTD: folic acid  1 mg Oral Daily  . DISCONTD: multivitamin with minerals  1 tablet Oral Daily  . DISCONTD: sodium chloride  3 mL Intravenous Q12H   Continuous Infusions:    .  sodium bicarbonate infusion 1000 mL 135 mL/hr at 05/30/12 1135  . DISCONTD: sodium chloride 150 mL (05/30/12 0352)  . DISCONTD: sodium chloride    . DISCONTD: sodium chloride      Active Problems:  Hepatitis C  Alcohol abuse  Chronic pain syndrome  Hyperkalemia  Rhabdomyolysis  Acute renal failure  Hyponatremia  Elevated liver  enzymes    Time spent:    MEMON,JEHANZEB  [facility] Pager [phone]. If 7PM-7AM, please contact night-coverage at [email or web address], password [facility] 05/30/2012, 12:23 PM  LOS: 2 days

## 2012-05-30 NOTE — Progress Notes (Signed)
UR Chart Review Completed  

## 2012-05-31 ENCOUNTER — Inpatient Hospital Stay (HOSPITAL_COMMUNITY): Payer: Medicare Other

## 2012-05-31 DIAGNOSIS — E860 Dehydration: Secondary | ICD-10-CM

## 2012-05-31 DIAGNOSIS — B1710 Acute hepatitis C without hepatic coma: Secondary | ICD-10-CM

## 2012-05-31 DIAGNOSIS — N179 Acute kidney failure, unspecified: Secondary | ICD-10-CM | POA: Diagnosis not present

## 2012-05-31 DIAGNOSIS — IMO0001 Reserved for inherently not codable concepts without codable children: Secondary | ICD-10-CM

## 2012-05-31 DIAGNOSIS — M6282 Rhabdomyolysis: Secondary | ICD-10-CM | POA: Diagnosis not present

## 2012-05-31 LAB — CBC
MCHC: 36.2 g/dL — ABNORMAL HIGH (ref 30.0–36.0)
Platelets: 161 10*3/uL (ref 150–400)
RDW: 12.8 % (ref 11.5–15.5)
WBC: 8.5 10*3/uL (ref 4.0–10.5)

## 2012-05-31 LAB — HEPATITIS B SURFACE ANTIBODY,QUALITATIVE: Hep B S Ab: NEGATIVE

## 2012-05-31 LAB — BASIC METABOLIC PANEL
BUN: 43 mg/dL — ABNORMAL HIGH (ref 6–23)
Calcium: 8.1 mg/dL — ABNORMAL LOW (ref 8.4–10.5)
Chloride: 92 mEq/L — ABNORMAL LOW (ref 96–112)
Creatinine, Ser: 5.29 mg/dL — ABNORMAL HIGH (ref 0.50–1.35)
GFR calc Af Amer: 14 mL/min — ABNORMAL LOW (ref >90)
GFR calc non Af Amer: 12 mL/min — ABNORMAL LOW (ref >90)

## 2012-05-31 LAB — HEPATITIS B SURFACE ANTIGEN: Hepatitis B Surface Ag: NEGATIVE

## 2012-05-31 LAB — PROTEIN, URINE, 24 HOUR
Collection Interval-UPROT: 24 hours
Protein, 24H Urine: 87 mg/d (ref 50–100)
Protein, Urine: 6 mg/dL

## 2012-05-31 LAB — URINALYSIS, ROUTINE W REFLEX MICROSCOPIC
Bilirubin Urine: NEGATIVE
Ketones, ur: NEGATIVE mg/dL
Nitrite: NEGATIVE
Specific Gravity, Urine: 1.01 (ref 1.005–1.030)
Urobilinogen, UA: 0.2 mg/dL (ref 0.0–1.0)

## 2012-05-31 LAB — ANTI-DNA ANTIBODY, DOUBLE-STRANDED: ds DNA Ab: <1 IU/mL (ref <30)

## 2012-05-31 LAB — CK: Total CK: 31766 U/L — ABNORMAL HIGH (ref 7–232)

## 2012-05-31 MED ORDER — TUBERCULIN PPD 5 UNIT/0.1ML ID SOLN
5.0000 [IU] | Freq: Once | INTRADERMAL | Status: AC
  Administered 2012-05-31: 5 [IU] via INTRADERMAL
  Filled 2012-05-31: qty 0.1

## 2012-05-31 NOTE — Procedures (Signed)
Central Venous Catheter Insertion Procedure Note for dialysis [name] [id number] 10-26-1965  Procedure: Insertion of Central Venous Catheter Indications: Acute renal failure, need for dialysis  Procedure Details Consent: Risks of procedure as well as the alternatives and risks of each were explained to the (patient/caregiver).  Consent for procedure obtained. Time Out: Verified patient identification, verified procedure, site/side was marked, verified correct patient position, special equipment/implants available, medications/allergies/relevent history reviewed, required imaging and test results available.  Performed  Maximum sterile technique was used including antiseptics, cap, gloves, gown, hand hygiene, mask and sheet. Skin prep: Iodine solution; local anesthetic administered A antimicrobial bonded/coated single lumen catheter was placed in the left femoral vein due to patient being a dialysis patient using the Seldinger technique.  Evaluation Blood flow good Complications: No apparent complications Patient did tolerate procedure well.   [name] A 05/31/2012, 2:37 PM

## 2012-05-31 NOTE — Progress Notes (Signed)
Subjective: Interval History: has complaints back pain otherwise she feels okay. He denies any nausea or vomiting..  Objective: Vital signs in last 24 hours: Temp:  [98.3 F (36.8 C)-99.4 F (37.4 C)] 98.3 F (36.8 C) (09/13 0422) Pulse Rate:  [115-119] 118  (09/13 0422) Resp:  [20] 20  (09/13 0422) BP: (114-143)/(73-83) 131/80 mmHg (09/13 0422) SpO2:  [95 %-98 %] 96 % (09/13 0422) Weight:  [69.5 kg (153 lb 3.5 oz)] 69.5 kg (153 lb 3.5 oz) (09/13 0422) Weight change: 1.2 kg (2 lb 10.3 oz)  Intake/Output from previous day: 09/12 0701 - 09/13 0700 In: 2558.8 [P.O.:360; I.V.:2078.8; IV Piggyback:120] Out: 975 [Urine:975] Intake/Output this shift: Total I/O In: 1419.8 [I.V.:1419.8] Out: -   generally he is alert no apparent distress Chest is clear to auscultation no rales rhonchi or egophony Heart exam revealed regular rate and rhythm no murmur Abdomen soft positive bowel sound Extremities no edema. b Results:  Basename 05/31/12 0510 05/30/12 0511  WBC 8.5 11.3*  HGB 13.3 14.2  HCT 36.7* 40.3  PLT 161 152   BMET:  Basename 05/31/12 0510 05/30/12 0855  NA 127* 121*  K 3.8 3.9  CL 92* 88*  CO2 21 22  GLUCOSE 89 106*  BUN 43* 35*  CREATININE 5.29* 4.23*  CALCIUM 8.1* 7.9*   No results found for this basename: PTH:2 in the last 72 hours Iron Studies: No results found for this basename: IRON,TIBC,TRANSFERRIN,FERRITIN in the last 72 hours  Studies/Results: US Renal  05/29/2012  *RADIOLOGY REPORT*  Clinical Data:  Acute renal failure, there are urine  RENAL/URINARY TRACT ULTRASOUND COMPLETE  Comparison:  None.  Findings:  Right Kidney:  The renal parenchyma is echogenic with respect to the liver and there is increased corticomedullary differentiation. Kidneys are upper limits of normal for size at 13.2 cm in length. No evidence of mass or hydronephrosis.  Left Kidney:  The renal parenchyma is echogenic and there is increased corticomedullary differentiation.  Upper limit of  normal for size at 14 cm in length. No evidence of mass or hydronephrosis.  Bladder:  Appears normal for degree of bladder distention.  IMPRESSION:  1.  Echogenic renal parenchyma bilaterally with increased corticomedullary differentiation suggesting underlying medical renal disease.  Given that the kidneys are at the upper limits of normal for size, HIV nephropathy is a possible differential consideration.  2.  No evidence of hydronephrosis.   Original Report Authenticated By: Vilma Prader    US Venous Img Lower Bilateral  05/29/2012  *RADIOLOGY REPORT*  Clinical Data: Bilateral leg pain, elevated CK enzymes, smoker  BILATERAL LOWER EXTREMITY VENOUS DUPLEX ULTRASOUND  Technique:  Gray-scale sonography with graded compression, as well as color Doppler and duplex ultrasound, were performed to evaluate the deep venous system of both lower extremities from the level of the common femoral vein through the popliteal and proximal calf veins.  Spectral Doppler was utilized to evaluate flow at rest and with distal augmentation maneuvers.  Comparison:  None.  Findings:  Normal compressibility of bilateral common femoral, superficial femoral, and popliteal veins is demonstrated, as well as the visualized proximal calf veins.  No filling defects to suggest DVT on grayscale or color Doppler imaging.  Doppler waveforms show normal direction of venous flow, normal respiratory phasicity and response to augmentation.  Included views of the gastrocnemius musculature demonstrates strandy edema throughout the muscle fibers bilaterally, worse on the right. This is nonspecific by ultrasound but can be seen with muscle strain, injury, or inflammation/edema.  IMPRESSION: No  evidence of deep vein thrombosis in either lower extremity.   Original Report Authenticated By: Judie Petit. Ruel Favors, M.D.     I have reviewed the patient's current medications.  Assessment/Plan: Problem #1 acute kidney injury multifactorial including rhabdomyolysis and  possibly secondary to cocaine his pending creatinine continued to increase and presently is none oliguric. Problem #2 history of hepatitis his liver function seems to be worsening. Patient was history of alcohol abuse and hepatitis C. Problem #3 history of rhabdomyolysis CPK improving but still significantly high. Problem #4 hyponatremia sodium is improving Problem #5 history of chronic pain syndrome still complaints of back pain. Problem #6 history of polysubstance abuse Problem #7 history of hepatitis C positive. Recommendation have discussed with the patient about doing dialysis as his renal function does not seem to be improving. Patient has agreed and understand what dialysis is. I discussed with him that I will call surgery to put a femoral dialysis catheter and I will do a short dialysis today and possibly tomorrow. I explained to him if his renal function improves will stop dialysis however if his renal function doesn't get better he may need to continue his dialysis 3 times a week as long as he is. We'll check a CBC, basic metabolic panel, hepatitis B surface antigen and hepatitis be surface antibody and hepatitis B core antibody. We will put PPD today.   LOS: 3 days   Alisyn Lequire S 05/31/2012,7:20 AM

## 2012-05-31 NOTE — Care Management Note (Signed)
    Page 1 of 2   06/07/2012     1:38:53 PM   CARE MANAGEMENT NOTE 06/07/2012  Patient:  [name], [name]   Account Number:  [id number]  Date Initiated:  05/31/2012  Documentation initiated by:  Rosemary Holms  Subjective/Objective Assessment:   Pt admitted from home where he lives with his wife.Marland Kitchen Hx of ETOH abuse. Admitted with leg and arm pain/weakness. Spoke to pt and wife at bedside. They would like to be established with a PCP who sees pts in the community and in the hospital.     Action/Plan:   Three physicians have been identified and CM will contact them to see if they accept new Medicare patients. Pt plans to DC home. No needs identified at this time.   Anticipated DC Date:  06/08/2012   Anticipated DC Plan:  HOME/SELF CARE      DC Planning Services  CM consult      Choice offered to / List presented to:             Status of service:  Completed, signed off Medicare Important Message given?  YES (If response is "NO", the following Medicare IM given date fields will be blank) Date Medicare IM given:  06/05/2012 Date Additional Medicare IM given:    Discharge Disposition:  HOME/SELF CARE  Per UR Regulation:    If discussed at Long Length of Stay Meetings, dates discussed:   06/04/2012  06/06/2012    Comments:  06/07/12 Rosemary Holms RN BSN CM 1100 Spoke to Pt and advised him that his appt with Dr. Felecia Shelling was rescheduled to 06/14/12 at 10:50  06/06/12 Koree Schopf RN BSN CM if renal func not improved, to [facility] for [facility] placement for Dialysis. CM call Dr. Letitia Neri office and rescheduled his hospital f/u new pt visit to 06/14/12 at 10:50  06/05/12 Henri Baumler RN BSN CM Plan to DC tomorrow. Bowel moving  06/03/12 1100 Taos Tapp RN BSN CM Pt has a f/u appt with Dr. Felecia Shelling who has agreed to see pt as his PCP after DC. FU is on 06/07/12.  05/31/12 1100 Zion Ta Leanord Hawking RN BSN CM

## 2012-05-31 NOTE — Progress Notes (Signed)
Went in the patients room because I suspected he was smoking AEB smoke coming out into the hall ashes noted in a Coke can, and he verbalized that he had been smoking.  I voiced to him that he was not allowed to smoke in the room or on the campus.  He stated that he did not know and that it would not happen again.

## 2012-05-31 NOTE — Progress Notes (Signed)
Subjective: This patient was admitted with bilateral leg pain, severe. He is going to progressive renal failure. His CPK was 50,000!Marland Kitchen His AST and ALT have been significantly elevated, especially his AST. His ESR is not elevated. 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 However, he did have a biopsy taken in January 2013 at [facility] which showed the following: 1. Inflammatory myopathy, mild. 2. Denervation atrophy, mild, associated with reinnervation of the skeletal muscle. 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 A complicating problem in this patient is that he is an alcoholic and also has a previous history of cocaine abuse. On this admission urine drug screen was totally negative. HIV is negative. He also has hepatitis C and is not a candidate for therapy for this due to his alcoholism.           Physical Exam: Blood pressure 131/80, pulse 118, temperature 98.3 F (36.8 C), temperature source Oral, resp. rate 20, height 5\' 8"  (1.727 m), weight 69.5 kg (153 lb 3.5 oz), SpO2 96.00%. He looks systemically well. He is alert and orientated. His tenderness in both his legs and muscles. Heart sounds are present and normal without murmurs. Lung fields are clear. He has no obvious signs of chronic liver disease. There are no focal neurological signs.   Investigations:     Basic Metabolic Panel:  Basename 05/31/12 0510 05/31/12 0011 05/30/12 0855 05/30/12 0511  NA 127* -- 121* --  K 3.8 -- 3.9 --  CL 92* -- 88* --  CO2 21 -- 22 --  GLUCOSE 89 -- 106* --  BUN 43* -- 35* --  CREATININE 5.29* -- 4.23* --  CALCIUM 8.1* -- 7.9* --  MG -- -- -- --  PHOS -- 5.1* -- 5.2*   Liver Function Tests:  [facility] 05/30/12 0855 05/30/12 0511  AST 2447* 2373*  ALT 498* 463*  ALKPHOS 59 52  BILITOT 0.3 0.2*  PROT 5.3* 5.0*  ALBUMIN 2.3* 2.2*     CBC:  Basename 05/31/12 0510 05/30/12 0511 05/28/12 1452    WBC 8.5 11.3* --  NEUTROABS -- -- 11.8*  HGB 13.3 14.2 --  HCT 36.7* 40.3 --  MCV 87.0 87.2 --  PLT 161 152 --    US Renal  05/29/2012  *RADIOLOGY REPORT*  Clinical Data:  Acute renal failure, there are urine  RENAL/URINARY TRACT ULTRASOUND COMPLETE  Comparison:  None.  Findings:  Right Kidney:  The renal parenchyma is echogenic with respect to the liver and there is increased corticomedullary differentiation. Kidneys are upper limits of normal for size at 13.2 cm in length. No evidence of mass or hydronephrosis.  Left Kidney:  The renal parenchyma is echogenic and there is increased corticomedullary differentiation.  Upper limit of normal for size at 14 cm in length. No evidence of mass or hydronephrosis.  Bladder:  Appears normal for degree of bladder distention.  IMPRESSION:  1.  Echogenic renal parenchyma bilaterally with increased corticomedullary differentiation suggesting underlying medical renal disease.  Given that the kidneys are at the upper limits of normal for size, HIV nephropathy is a possible differential consideration.  2.  No evidence of hydronephrosis.   Original Report Authenticated By: Vilma Prader    US Venous Img Lower Bilateral  05/29/2012  *RADIOLOGY REPORT*  Clinical Data: Bilateral leg pain, elevated CK enzymes, smoker  BILATERAL LOWER EXTREMITY VENOUS DUPLEX ULTRASOUND  Technique:  Gray-scale sonography with graded compression, as well as color Doppler  and duplex ultrasound, were performed to evaluate the deep venous system of both lower extremities from the level of the common femoral vein through the popliteal and proximal calf veins.  Spectral Doppler was utilized to evaluate flow at rest and with distal augmentation maneuvers.  Comparison:  None.  Findings:  Normal compressibility of bilateral common femoral, superficial femoral, and popliteal veins is demonstrated, as well as the visualized proximal calf veins.  No filling defects to suggest DVT on grayscale or color Doppler  imaging.  Doppler waveforms show normal direction of venous flow, normal respiratory phasicity and response to augmentation.  Included views of the gastrocnemius musculature demonstrates strandy edema throughout the muscle fibers bilaterally, worse on the right. This is nonspecific by ultrasound but can be seen with muscle strain, injury, or inflammation/edema.  IMPRESSION: No evidence of deep vein thrombosis in either lower extremity.   Original Report Authenticated By: Judie Petit. Ruel Favors, M.D.       Medications: I have reviewed the patient's current medications.  Impression: 1. 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 2. Acute progressive worsening renal failure, now needing dialysis. 3. Hyponatremia, related to dehydration, improving. 4. Hepatitis C, not a candidate for immunotherapy in view of his alcoholism. 5. History of cocaine abuse in the past and noncompliance with behavioral therapy appointments.     Plan: 1. Patient needs a muscle biopsy again in view of the significant elevated CPK. I think that if the muscle biopsy is not able to be done, and empirical trial of intravenous steroids should be started. I will surgery to try to obtain a muscle biopsy prior to starting steroids. 2. Agree to proceed with dialysis.     LOS: 3 days   Wilson Singer Pager [phone]  05/31/2012, 8:33 AM

## 2012-05-31 NOTE — Progress Notes (Signed)
Pt. First dialysis tx ever. No outpatient plan of care. Hep B status neg. Consent signed, tx initiated without difficulty.

## 2012-06-01 ENCOUNTER — Inpatient Hospital Stay (HOSPITAL_COMMUNITY): Payer: Medicare Other

## 2012-06-01 DIAGNOSIS — IMO0001 Reserved for inherently not codable concepts without codable children: Secondary | ICD-10-CM | POA: Diagnosis not present

## 2012-06-01 DIAGNOSIS — R7989 Other specified abnormal findings of blood chemistry: Secondary | ICD-10-CM | POA: Diagnosis not present

## 2012-06-01 DIAGNOSIS — N179 Acute kidney failure, unspecified: Secondary | ICD-10-CM | POA: Diagnosis not present

## 2012-06-01 DIAGNOSIS — M6282 Rhabdomyolysis: Secondary | ICD-10-CM | POA: Diagnosis not present

## 2012-06-01 DIAGNOSIS — E875 Hyperkalemia: Secondary | ICD-10-CM | POA: Diagnosis not present

## 2012-06-01 LAB — BASIC METABOLIC PANEL
BUN: 31 mg/dL — ABNORMAL HIGH (ref 6–23)
Chloride: 94 mEq/L — ABNORMAL LOW (ref 96–112)
Glucose, Bld: 95 mg/dL (ref 70–99)
Potassium: 3.5 mEq/L (ref 3.5–5.1)

## 2012-06-01 LAB — CBC
HCT: 40.5 % (ref 39.0–52.0)
Hemoglobin: 14.4 g/dL (ref 13.0–17.0)
MCH: 31 pg (ref 26.0–34.0)
MCHC: 35.6 g/dL (ref 30.0–36.0)
MCV: 87.1 fL (ref 78.0–100.0)

## 2012-06-01 LAB — HEPATITIS B CORE ANTIBODY, IGM: Hep B C IgM: NEGATIVE

## 2012-06-01 MED ORDER — FUROSEMIDE 10 MG/ML IJ SOLN
160.0000 mg | Freq: Two times a day (BID) | INTRAMUSCULAR | Status: DC
  Administered 2012-06-01: 160 mg via INTRAVENOUS
  Filled 2012-06-01 (×6): qty 16

## 2012-06-01 MED ORDER — PREDNISONE 20 MG PO TABS: 40.0000 mg | ORAL_TABLET | Freq: Every day | ORAL | Status: DC

## 2012-06-01 MED ORDER — PREDNISONE 20 MG PO TABS
40.0000 mg | ORAL_TABLET | Freq: Every day | ORAL | Status: DC
  Administered 2012-06-01 – 2012-06-02 (×2): 40 mg via ORAL
  Filled 2012-06-01 (×2): qty 2

## 2012-06-01 MED ORDER — SODIUM CHLORIDE 0.9 % IV SOLN
INTRAVENOUS | Status: DC
  Administered 2012-06-01 – 2012-06-03 (×5): via INTRAVENOUS
  Administered 2012-06-03: 150 mL/h via INTRAVENOUS
  Administered 2012-06-03: 05:00:00 via INTRAVENOUS
  Administered 2012-06-04: 150 mL/h via INTRAVENOUS
  Administered 2012-06-04 – 2012-06-05 (×2): via INTRAVENOUS

## 2012-06-01 MED ORDER — FUROSEMIDE 10 MG/ML IJ SOLN: 120.0000 mg | Freq: Two times a day (BID) | INTRAVENOUS | Status: DC

## 2012-06-01 NOTE — Progress Notes (Signed)
     Subjective: This patient was admitted with bilateral leg pain, severe. He is going to progressive renal failure. His CPK was 50,000!Marland Kitchen His AST and ALT have been significantly elevated, especially his AST. His ESR is not elevated. 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 However, he did have a biopsy taken in January 2013 at [facility] which showed the following: 1. Inflammatory myopathy, mild. 2. Denervation atrophy, mild, associated with reinnervation of the skeletal muscle. 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 A complicating problem in this patient is that he is an alcoholic and also has a previous history of cocaine abuse. On this admission urine drug screen was totally negative. HIV is negative. He also has hepatitis C and is not a candidate for therapy for this due to his alcoholism. Today he continues to complain of pain his legs. He did undergo dialysis yesterday and is due to have it again today. He appears to be more swollen today than yesterday.           Physical Exam: Blood pressure 127/78, pulse 107, temperature 98.2 F (36.8 C), temperature source Oral, resp. rate 20, height 5\' 8"  (1.727 m), weight 67.359 kg (148 lb 8 oz), SpO2 95.00%. He looks systemically well. He is alert and orientated. His tenderness in both his legs and muscles. Heart sounds are present and normal without murmurs. Lung fields are clear. He has no obvious signs of chronic liver disease. There are no focal neurological signs. He does appear to have pitting peripheral edema in his legs.   Investigations:     Basic Metabolic Panel:  Basename 06/01/12 0452 05/31/12 0510 05/31/12 0011 05/30/12 0511  NA 133* 127* -- --  K 3.5 3.8 -- --  CL 94* 92* -- --  CO2 24 21 -- --  GLUCOSE 95 89 -- --  BUN 31* 43* -- --  CREATININE 4.74* 5.29* -- --  CALCIUM 8.6 8.1* -- --  MG -- -- -- --  PHOS -- -- 5.1* 5.2*   Liver  Function Tests:  Basename 05/30/12 0855 05/30/12 0511  AST 2447* 2373*  ALT 498* 463*  ALKPHOS 59 52  BILITOT 0.3 0.2*  PROT 5.3* 5.0*  ALBUMIN 2.3* 2.2*     CBC:  Basename 06/01/12 0452 05/31/12 0510  WBC 6.8 8.5  NEUTROABS -- --  HGB 14.4 13.3  HCT 40.5 36.7*  MCV 87.1 87.0  PLT 172 161    No results found.    Medications: I have reviewed the patient's current medications.  Impression: 1. 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 2. Acute progressive worsening renal failure, now needing dialysis. 3. Hyponatremia, related to dehydration, improving. 4. Hepatitis C, not a candidate for immunotherapy in view of his alcoholism. 5. History of cocaine abuse in the past and noncompliance with behavioral therapy appointments.     Plan: 1. Start empirical steroids. I'll speak with pathologist on Monday regarding the muscle biopsy results that were done and [facility] to see whether there is sufficient information on here to diagnosis of polymyositis. Otherwise, I think a muscle biopsy would be useful. 2. Continue with dialysis per nephrology.    LOS: 4 days   Wilson Singer Pager [phone]  06/01/2012, 8:09 AM

## 2012-06-01 NOTE — Progress Notes (Signed)
Subjective: Interval History: has no complaint of nausea or vomiting. This is a complaints of back pain. He denies any difficulty increasing..  Objective: Vital signs in last 24 hours: Temp:  [98 F (36.7 C)-98.3 F (36.8 C)] 98.2 F (36.8 C) (09/14 0440) Pulse Rate:  [107-116] 107  (09/13 2223) Resp:  [18-20] 20  (09/14 0440) BP: (109-145)/(65-87) 127/78 mmHg (09/14 0440) SpO2:  [93 %-98 %] 95 % (09/14 0440) Weight:  [67.359 kg (148 lb 8 oz)-69.5 kg (153 lb 3.5 oz)] 67.359 kg (148 lb 8 oz) (09/14 0440) Weight change: 0 kg (0 lb)  Intake/Output from previous day: 09/13 0701 - 09/14 0700 In: 2139.8 [P.O.:720; I.V.:1419.8] Out: 1400 [Urine:1400] Intake/Output this shift:    General appearance: alert, cooperative and no distress Resp: clear to auscultation bilaterally Cardio: regular rate and rhythm, S1, S2 normal, no murmur, click, rub or gallop GI: soft, non-tender; bowel sounds normal; no masses,  no organomegaly Extremities: edema 2+ edema bilaterally  Lab Results:  Basename 06/01/12 0452 05/31/12 0510  WBC 6.8 8.5  HGB 14.4 13.3  HCT 40.5 36.7*  PLT 172 161   BMET:  Basename 06/01/12 0452 05/31/12 0510  NA 133* 127*  K 3.5 3.8  CL 94* 92*  CO2 24 21  GLUCOSE 95 89  BUN 31* 43*  CREATININE 4.74* 5.29*  CALCIUM 8.6 8.1*   No results found for this basename: PTH:2 in the last 72 hours Iron Studies: No results found for this basename: IRON,TIBC,TRANSFERRIN,FERRITIN in the last 72 hours  Studies/Results: No results found.  I have reviewed the patient's current medications.  Assessment/Plan: Problem #1 acute kidney injury his BUN is 31 creatinine is 4.74 he status post sure to dialysis yesterday. His potassium is 3.5. Problem #2 hyponatremia sodium 163 has improved Problem #3 history of her hepatitis C infection presently patient doesn't have any proteinuria however his history and C4 is low. Problem #4 history of her chronic back pain Problem #5 history of  polysubstance abuse Problem #6 elevated liver function tests patient seems to have a myositis from before. His CPK has come down most likely secondary to dialysis. Still at this moment seems to be high. Problem #7 elevated liver function tests Plan: We'll do dialysis today Will the continue his IV fluid and increase his Lasix and see if his renal function will improve.    LOS: 4 days   Dayonna Selbe S 06/01/2012,8:37 AM

## 2012-06-01 NOTE — Progress Notes (Signed)
Dialysis tx initiated without difficulty via R. Fem cath.. Pt. Resting comfortably.

## 2012-06-01 NOTE — Progress Notes (Addendum)
Subjective: This patient was admitted with bilateral leg pain, severe. He is going to progressive renal failure. His CPK was 50,000!Marland Kitchen His AST and ALT have been significantly elevated, especially his AST. His ESR is not elevated. 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 However, he did have a biopsy taken in January 2013 at [facility] which showed the following: 1. Inflammatory myopathy, mild. 2. Denervation atrophy, mild, associated with reinnervation of the skeletal muscle. 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 A complicating problem in this patient is that he is an alcoholic and also has a previous history of cocaine abuse. On this admission urine drug screen was totally negative. HIV is negative. He also has hepatitis C and is not a candidate for therapy for this due to his alcoholism. Today he continues to complain of pain his legs. He did undergo dialysis yesterday and is due to have it again today. He appears to be more swollen today than yesterday.           Physical Exam: Blood pressure 127/78, pulse 107, temperature 98.2 F (36.8 C), temperature source Oral, resp. rate 20, height 5\' 8"  (1.727 m), weight 67.359 kg (148 lb 8 oz), SpO2 95.00%. He looks systemically well. He is alert and orientated. His tenderness in both his legs and muscles. Heart sounds are present and normal without murmurs. Lung fields are clear. He has no obvious signs of chronic liver disease. There are no focal neurological signs. He does appear to have pitting peripheral edema in his legs.   Investigations:     Basic Metabolic Panel:  Basename 06/01/12 0452 05/31/12 0510 05/31/12 0011 05/30/12 0511  NA 133* 127* -- --  K 3.5 3.8 -- --  CL 94* 92* -- --  CO2 24 21 -- --  GLUCOSE 95 89 -- --  BUN 31* 43* -- --  CREATININE 4.74* 5.29* -- --  CALCIUM 8.6 8.1* -- --  MG -- -- -- --  PHOS -- -- 5.1* 5.2*   Liver  Function Tests:  Basename 05/30/12 0855 05/30/12 0511  AST 2447* 2373*  ALT 498* 463*  ALKPHOS 59 52  BILITOT 0.3 0.2*  PROT 5.3* 5.0*  ALBUMIN 2.3* 2.2*     CBC:  Basename 06/01/12 0452 05/31/12 0510  WBC 6.8 8.5  NEUTROABS -- --  HGB 14.4 13.3  HCT 40.5 36.7*  MCV 87.1 87.0  PLT 172 161    No results found.    Medications: I have reviewed the patient's current medications.  Impression: 1. 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 2. Acute progressive worsening renal failure, now needing dialysis. 3. Hyponatremia, related to dehydration, improving. 4. Hepatitis C, not a candidate for immunotherapy in view of his alcoholism. 5. History of cocaine abuse in the past and noncompliance with behavioral therapy appointments.     Plan: 1. Consider use of oral steroids. However there is concern over patient having hepatitis C and the use of steroids. I will ask gastroenterology to see this patient, with regard to the use of steroids and hepatitis C. The patient has been deemed to be not a candidate for immunotherapy for hepatitis C in view of his alcoholism. 2. Continue dialysis per nephrology.    LOS: 4 days   Wilson Singer Pager [phone]  06/01/2012, 8:24 AM    Addendum: I have spoken to Dr. Kendell Bane, gastroenterologist, who feels that if steroids are to be used, they should be used. He  does not feel that hepatitis C, which is a chronic disease, would suddenly become fulminant. I will start this man on empirical prednisone 40 mg daily.

## 2012-06-02 DIAGNOSIS — N179 Acute kidney failure, unspecified: Secondary | ICD-10-CM | POA: Diagnosis not present

## 2012-06-02 DIAGNOSIS — E875 Hyperkalemia: Secondary | ICD-10-CM | POA: Diagnosis not present

## 2012-06-02 DIAGNOSIS — M6282 Rhabdomyolysis: Secondary | ICD-10-CM | POA: Diagnosis not present

## 2012-06-02 LAB — COMPREHENSIVE METABOLIC PANEL
Albumin: 2.5 g/dL — ABNORMAL LOW (ref 3.5–5.2)
Alkaline Phosphatase: 58 U/L (ref 39–117)
BUN: 19 mg/dL (ref 6–23)
CO2: 28 mEq/L (ref 19–32)
Chloride: 95 mEq/L — ABNORMAL LOW (ref 96–112)
GFR calc Af Amer: 26 mL/min — ABNORMAL LOW (ref >90)
Glucose, Bld: 167 mg/dL — ABNORMAL HIGH (ref 70–99)
Potassium: 3.6 mEq/L (ref 3.5–5.1)
Total Bilirubin: 0.2 mg/dL — ABNORMAL LOW (ref 0.3–1.2)

## 2012-06-02 LAB — CBC
MCV: 88.5 fL (ref 78.0–100.0)
Platelets: 130 10*3/uL — ABNORMAL LOW (ref 150–400)
RDW: 12.9 % (ref 11.5–15.5)
WBC: 9.3 10*3/uL (ref 4.0–10.5)

## 2012-06-02 LAB — HEPATIC FUNCTION PANEL
AST: 299 U/L — ABNORMAL HIGH (ref 0–37)
Albumin: 2.4 g/dL — ABNORMAL LOW (ref 3.5–5.2)
Total Bilirubin: 0.2 mg/dL — ABNORMAL LOW (ref 0.3–1.2)

## 2012-06-02 LAB — PHOSPHORUS: Phosphorus: 2.2 mg/dL — ABNORMAL LOW (ref 2.3–4.6)

## 2012-06-02 MED ORDER — SODIUM CHLORIDE 0.9 % IJ SOLN
INTRAMUSCULAR | Status: AC
  Filled 2012-06-02: qty 3

## 2012-06-02 MED ORDER — METOLAZONE 5 MG PO TABS
2.5000 mg | ORAL_TABLET | Freq: Two times a day (BID) | ORAL | Status: DC
  Administered 2012-06-02 (×2): 2.5 mg via ORAL
  Filled 2012-06-02 (×2): qty 1

## 2012-06-02 MED ORDER — LORAZEPAM 1 MG PO TABS
1.0000 mg | ORAL_TABLET | Freq: Three times a day (TID) | ORAL | Status: DC | PRN
  Administered 2012-06-02 – 2012-06-07 (×2): 1 mg via ORAL
  Filled 2012-06-02 (×2): qty 1

## 2012-06-02 MED ORDER — FUROSEMIDE 10 MG/ML IJ SOLN
160.0000 mg | Freq: Two times a day (BID) | INTRAVENOUS | Status: DC
  Filled 2012-06-02 (×3): qty 16

## 2012-06-02 MED ORDER — FUROSEMIDE 10 MG/ML IJ SOLN
200.0000 mg | Freq: Two times a day (BID) | INTRAVENOUS | Status: DC
  Administered 2012-06-02 – 2012-06-06 (×8): 200 mg via INTRAVENOUS
  Filled 2012-06-02 (×8): qty 20

## 2012-06-02 NOTE — Progress Notes (Signed)
Nurse notified by [facility] that pt was vomiting in trash can.  Nurse entered pt's room.  Pt vomiting in trash can.  Pt had not eaten any lunch.  Pt stated that he had heartburn and had drank some milk to help relieve the heartburn.  Pt said he vomited shortly after.  Fara Chute, RN 06/02/2012

## 2012-06-02 NOTE — Progress Notes (Signed)
Subjective: This patient was admitted with bilateral leg pain, severe. He is going to progressive renal failure. His CPK was 50,000!Marland Kitchen His AST and ALT have been significantly elevated, especially his AST. His ESR is not elevated. 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 However, he did have a biopsy taken in January 2013 at [facility] which showed the following: 1. Inflammatory myopathy, mild. 2. Denervation atrophy, mild, associated with reinnervation of the skeletal muscle. 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 A complicating problem in this patient is that he is an alcoholic and also has a previous history of cocaine abuse. On this admission urine drug screen was totally negative. HIV is negative. He also has hepatitis C and is not a candidate for therapy for this due to his alcoholism. Today he continues to complain of pain in his legs but seems to be somewhat better. He has not really mobilized  so far.           Physical Exam: Blood pressure 149/92, pulse 90, temperature 97.8 F (36.6 C), temperature source Oral, resp. rate 20, height 5\' 8"  (1.727 m), weight 68.2 kg (150 lb 5.7 oz), SpO2 98.00%. He looks systemically well. He is alert and orientated. His tenderness in both his legs and muscles. Heart sounds are present and normal without murmurs. Lung fields are clear. He has no obvious signs of chronic liver disease. There are no focal neurological signs. He does appear to have pitting peripheral edema in his legs.   Investigations:     Basic Metabolic Panel:  Basename 06/01/12 0452 05/31/12 0510 05/31/12 0011  NA 133* 127* --  K 3.5 3.8 --  CL 94* 92* --  CO2 24 21 --  GLUCOSE 95 89 --  BUN 31* 43* --  CREATININE 4.74* 5.29* --  CALCIUM 8.6 8.1* --  MG -- -- --  PHOS -- -- 5.1*   Liver Function Tests:  [facility] 05/30/12 0855  AST 2447*  ALT 498*  ALKPHOS 59  BILITOT 0.3    PROT 5.3*  ALBUMIN 2.3*     CBC:  Basename 06/02/12 0645 06/01/12 0452  WBC 9.3 6.8  NEUTROABS -- --  HGB 13.5 14.4  HCT 38.5* 40.5  MCV 88.5 87.1  PLT 130* 172    No results found.    Medications: I have reviewed the patient's current medications.  Impression: 1. Probable rhabdomyolysis. Although, initially I thought this might be polymyositis, on looking at all the data I now believe this is likely to be rhabdomyolysis. This would be in keeping with the acute renal failure and also elevation of liver enzymes. Cocaine is probably going to be the etiology in this case. 2. Acute progressive worsening renal failure, now needing dialysis. 3. Hyponatremia, related to dehydration, improving. 4. Hepatitis C, not a candidate for immunotherapy in view of his alcoholism. 5. History of cocaine abuse in the past and noncompliance with behavioral therapy appointments.     Plan: 1. Continue with current intravenous fluids and Lasix per nephrology. Monitor renal function and CPK closely. 2. No need for muscle biopsy or steroids at this point in view of diagnosis of rhabdomyolysis.   LOS: 5 days   Wilson Singer Pager [phone]  06/02/2012, 8:53 AM    Addendum: I have spoken to Dr. Kendell Bane, gastroenterologist, who feels that if steroids are to be used, they should be used. He does not feel that hepatitis C, which is a chronic  disease, would suddenly become fulminant. I will start this man on empirical prednisone 40 mg daily.

## 2012-06-02 NOTE — Progress Notes (Signed)
Pt's BP 154/94.  Dr. Karilyn Cota paged and notified.

## 2012-06-02 NOTE — Progress Notes (Signed)
Pt's BP 161/104.  Dr. Karilyn Cota paged.

## 2012-06-02 NOTE — Progress Notes (Signed)
Dr. Karilyn Cota returned page and stated that he put in order to decrease his fluid rate.  Will continue to monitor.

## 2012-06-02 NOTE — Progress Notes (Signed)
Pt given Zofran prn IV for nausea/vomiting.  Dr. Karilyn Cota notified via page.  Asked about possible medication for acid reflux.  Will continue to monitor.

## 2012-06-02 NOTE — Progress Notes (Signed)
Pt requesting PRN for anxiety.  Dr. Karilyn Cota paged.  Dr. Karilyn Cota returned page and gave orders for Ativan 1 mg po every 8 hours as needed for anxiety.  Informed Dr. Karilyn Cota that IV change due x2 RN attempts without success. Another ED RN is attempting.  Dr. Karilyn Cota stated that if unable to obtain IV access, notify Dr. Kristian Covey since he is managing the patient's fluids.  Orders followed.

## 2012-06-02 NOTE — Progress Notes (Signed)
Subjective: Interval History: has no complaint of nausea or vomiting. Overall patient states that he's feeling better. Has this moment patient also does not have any difficulty breathing no orthopnea or paroxysmal nocturnal dyspnea..  Objective: Vital signs in last 24 hours: Temp:  [97.8 F (36.6 C)-98.4 F (36.9 C)] 97.8 F (36.6 C) (09/15 0504) Pulse Rate:  [87-107] 90  (09/15 0504) Resp:  [18-20] 20  (09/15 0504) BP: (100-149)/(65-92) 149/92 mmHg (09/15 0504) SpO2:  [94 %-98 %] 98 % (09/15 0504) Weight:  [65.4 kg (144 lb 2.9 oz)-68.2 kg (150 lb 5.7 oz)] 68.2 kg (150 lb 5.7 oz) (09/15 0504) Weight change: -2.1 kg (-4 lb 10.1 oz)  Intake/Output from previous day: 09/14 0701 - 09/15 0700 In: 1080 [P.O.:1080] Out: 3000 [Urine:1000] Intake/Output this shift: Total I/O In: -  Out: 200 [Urine:200]  Generally patient is alert in no apparent distress Chest is clear to auscultation no rales no rhonchi or egophony His heart exam regular rate and rhythm no murmur S3 Abdomen soft positive bowel sound Extremities trace edema.  Lab Results:  Basename 06/02/12 0645 06/01/12 0452  WBC 9.3 6.8  HGB 13.5 14.4  HCT 38.5* 40.5  PLT 130* 172   BMET:  Basename 06/02/12 0810 06/01/12 0452  NA 131* 133*  K 3.6 3.5  CL 95* 94*  CO2 28 24  GLUCOSE 167* 95  BUN 19 31*  CREATININE 3.12* 4.74*  CALCIUM 8.1* 8.6   No results found for this basename: PTH:2 in the last 72 hours Iron Studies: No results found for this basename: IRON,TIBC,TRANSFERRIN,FERRITIN in the last 72 hours  Studies/Results: No results found.  I have reviewed the patient's current medications.  Assessment/Plan: Problem #1 acute kidney injury he status post hemodialysis yesterday his BUN is 19 creatinine is 3.12 with potassium of 3.6. Problem #2 history of rhabdomyolysis CPK has come down to 7000. Calcium alkaline phosphatase is normal. Problem #3 elevated liver function tests that also seems to be  improving. Problem #4 history of hepatitis C positive Problem #5 history of leg pain patient wheeze previous history of myositis. Problem #6 history of chronic back pain Problem #7 history of polysubstance abuse.  Plan: We'll increase his IV fluid 250 cc per hour We'll increase his Lasix to 200 mg IV twice a day and will and also because of her milligram by mouth daily. We'll check his basic metabolic panel and see if his renal function seems to be stable  we will hold dialysis otherwise we'll continue with dialysis. If he patient requires continuous dialysis he may need to have IJA  dialysis catheter placement.    LOS: 5 days   [name] S 06/02/2012,9:25 AM

## 2012-06-03 DIAGNOSIS — E875 Hyperkalemia: Secondary | ICD-10-CM | POA: Diagnosis not present

## 2012-06-03 DIAGNOSIS — N179 Acute kidney failure, unspecified: Secondary | ICD-10-CM | POA: Diagnosis not present

## 2012-06-03 DIAGNOSIS — M6282 Rhabdomyolysis: Secondary | ICD-10-CM | POA: Diagnosis not present

## 2012-06-03 DIAGNOSIS — R7989 Other specified abnormal findings of blood chemistry: Secondary | ICD-10-CM | POA: Diagnosis not present

## 2012-06-03 LAB — COMPREHENSIVE METABOLIC PANEL
ALT: 221 U/L — ABNORMAL HIGH (ref 0–53)
AST: 203 U/L — ABNORMAL HIGH (ref 0–37)
Alkaline Phosphatase: 56 U/L (ref 39–117)
CO2: 27 mEq/L (ref 19–32)
Calcium: 8.3 mg/dL — ABNORMAL LOW (ref 8.4–10.5)
GFR calc non Af Amer: 18 mL/min — ABNORMAL LOW (ref >90)
Glucose, Bld: 98 mg/dL (ref 70–99)
Potassium: 3.5 mEq/L (ref 3.5–5.1)
Sodium: 134 mEq/L — ABNORMAL LOW (ref 135–145)
Total Protein: 5.6 g/dL — ABNORMAL LOW (ref 6.0–8.3)

## 2012-06-03 LAB — HEPATIC FUNCTION PANEL
ALT: 221 U/L — ABNORMAL HIGH (ref 0–53)
AST: 204 U/L — ABNORMAL HIGH (ref 0–37)
Bilirubin, Direct: <0.1 mg/dL (ref 0.0–0.3)
Total Bilirubin: 0.2 mg/dL — ABNORMAL LOW (ref 0.3–1.2)

## 2012-06-03 LAB — CK: Total CK: 5100 U/L — ABNORMAL HIGH (ref 7–232)

## 2012-06-03 MED ORDER — METOLAZONE 5 MG PO TABS
5.0000 mg | ORAL_TABLET | Freq: Two times a day (BID) | ORAL | Status: DC
  Administered 2012-06-03 – 2012-06-06 (×7): 5 mg via ORAL
  Filled 2012-06-03 (×7): qty 1

## 2012-06-03 NOTE — Progress Notes (Signed)
     Subjective: This patient is slowly improving. His legs do not hurt as much. He is keen to go home.           Physical Exam: Blood pressure 139/73, pulse 107, temperature 98.1 F (36.7 C), temperature source Oral, resp. rate 20, height 5\' 8"  (1.727 m), weight 67.858 kg (149 lb 9.6 oz), SpO2 90.00%. He looks systemically well. He is alert and orientated. His tenderness in both his legs has decreased. Heart sounds are present and normal without murmurs. Lung fields are clear. He has no obvious signs of chronic liver disease. There are no focal neurological signs. He does appear to have pitting peripheral edema in his legs.   Investigations:     Basic Metabolic Panel:  Basename 06/03/12 0515 06/02/12 0810  NA 134* 131*  K 3.5 3.6  CL 95* 95*  CO2 27 28  GLUCOSE 98 167*  BUN 26* 19  CREATININE 3.84* 3.12*  CALCIUM 8.3* 8.1*  MG -- --  PHOS -- 2.2*   Liver Function Tests:  Basename 06/03/12 0515 06/02/12 0810  AST 203* 161*096*  ALT 221* 258*260*  ALKPHOS 56 5861  BILITOT 0.2* 0.2*0.2*  PROT 5.6* 5.6*5.5*  ALBUMIN 2.6* 2.5*2.4*     CBC:  Basename 06/02/12 0645 06/01/12 0452  WBC 9.3 6.8  NEUTROABS -- --  HGB 13.5 14.4  HCT 38.5* 40.5  MCV 88.5 87.1  PLT 130* 172        Medications: I have reviewed the patient's current medications.  Impression: 1. Probable rhabdomyolysis. Although, initially I thought this might be polymyositis, on looking at all the data I now believe this is likely to be rhabdomyolysis. This would be in keeping with the acute renal failure and also elevation of liver enzymes. Cocaine is probably going to be the etiology in this case. This is now improving with CPK down to 5100 only. He started out 50,000!. 2. Acute progressive worsening renal failure, now needing dialysis. This is not quite stabilizing yet. 3. Hepatitis C, not a candidate for immunotherapy in view of his alcoholism. 4. History of cocaine abuse in the past  and noncompliance with behavioral therapy appointments.     Plan: 1. Continue with current intravenous fluids and Lasix per nephrology. Monitor renal function and CPK closely. 2. No need for muscle biopsy or steroids at this point in view of diagnosis of rhabdomyolysis. 3. Should be able to be discharged home soon providing his renal function improves and we can remove the dialysis catheter.  LOS: 6 days   Wilson Singer Pager [phone]  06/03/2012, 7:52 AM    Addendum: I have spoken to Dr. Kendell Bane, gastroenterologist, who feels that if steroids are to be used, they should be used. He does not feel that hepatitis C, which is a chronic disease, would suddenly become fulminant. I will start this man on empirical prednisone 40 mg daily.

## 2012-06-03 NOTE — Progress Notes (Signed)
Subjective: Interval History: has no complaint of nausea or vomiting. Patient denies any difficulty breathing. At this moment he offers no complaints..  Objective: Vital signs in last 24 hours: Temp:  [97.5 F (36.4 C)-98.1 F (36.7 C)] 98.1 F (36.7 C) (09/16 0511) Pulse Rate:  [102-107] 107  (09/16 0511) Resp:  [20] 20  (09/16 0511) BP: (139-161)/(73-104) 139/73 mmHg (09/16 0511) SpO2:  [90 %-97 %] 90 % (09/16 0511) Weight:  [67.858 kg (149 lb 9.6 oz)] 67.858 kg (149 lb 9.6 oz) (09/16 0511) Weight change: 0.458 kg (1 lb 0.2 oz)  Intake/Output from previous day: 09/15 0701 - 09/16 0700 In: 480 [P.O.:480] Out: 2150 [Urine:2150] Intake/Output this shift:    General appearance: alert, cooperative and no distress Resp: clear to auscultation bilaterally Cardio: regular rate and rhythm, S1, S2 normal, no murmur, click, rub or gallop GI: soft, non-tender; bowel sounds normal; no masses,  no organomegaly Extremities: extremities normal, atraumatic, no cyanosis or edema  Lab Results:  [facility] 06/02/12 0645 06/01/12 0452  WBC 9.3 6.8  HGB 13.5 14.4  HCT 38.5* 40.5  PLT 130* 172   BMET:  Basename 06/03/12 0515 06/02/12 0810  NA 134* 131*  K 3.5 3.6  CL 95* 95*  CO2 27 28  GLUCOSE 98 167*  BUN 26* 19  CREATININE 3.84* 3.12*  CALCIUM 8.3* 8.1*   No results found for this basename: PTH:2 in the last 72 hours Iron Studies: No results found for this basename: IRON,TIBC,TRANSFERRIN,FERRITIN in the last 72 hours  Studies/Results: No results found.  I have reviewed the patient's current medications.  Assessment/Plan: Problem #1 acute kidney injury secondary to rhabdomyolysis is penicillin 6 creatinine 3.84 still her renal function doesn't seem to be stabilizing. However the increase mild. Patient also seems to be making loss of urine. Problem #2 rhabdomyolysis his CPK seems to be declining. Problem #3 history of hyperkalemia potassium remains 3.5 stable Problem #4 history  of hepatitis C Problem #5 elevated liver function seems to be improving. Problem #6 polysubstance abuse. Problem #7 hypertension blood pressure seems to be fluctuating. Plan: We'll continue his diuretics and hydration. We'll check his CPK and basic metabolic panel in the morning. If his renal function stabilizes probably would remove the catheter and discharge patient to be followed as an out patient. If not we may need to consider dialyzing him tomorrow and put Ash-split catheter.    LOS: 6 days   Tynell Winchell S 06/03/2012,7:43 AM

## 2012-06-04 ENCOUNTER — Inpatient Hospital Stay (HOSPITAL_COMMUNITY): Payer: Medicare Other

## 2012-06-04 DIAGNOSIS — N179 Acute kidney failure, unspecified: Secondary | ICD-10-CM | POA: Diagnosis not present

## 2012-06-04 DIAGNOSIS — M6282 Rhabdomyolysis: Secondary | ICD-10-CM | POA: Diagnosis not present

## 2012-06-04 DIAGNOSIS — E875 Hyperkalemia: Secondary | ICD-10-CM | POA: Diagnosis not present

## 2012-06-04 LAB — CBC
HCT: 42.6 % (ref 39.0–52.0)
Hemoglobin: 14.7 g/dL (ref 13.0–17.0)
RBC: 4.81 MIL/uL (ref 4.22–5.81)
WBC: 8.1 10*3/uL (ref 4.0–10.5)

## 2012-06-04 LAB — COMPREHENSIVE METABOLIC PANEL
Albumin: 2.9 g/dL — ABNORMAL LOW (ref 3.5–5.2)
BUN: 27 mg/dL — ABNORMAL HIGH (ref 6–23)
Calcium: 8.7 mg/dL (ref 8.4–10.5)
GFR calc Af Amer: 17 mL/min — ABNORMAL LOW (ref >90)
Glucose, Bld: 114 mg/dL — ABNORMAL HIGH (ref 70–99)
Total Protein: 6.4 g/dL (ref 6.0–8.3)

## 2012-06-04 MED ORDER — HEPARIN SODIUM (PORCINE) 1000 UNIT/ML DIALYSIS
20.0000 [IU]/kg | INTRAMUSCULAR | Status: DC | PRN
  Administered 2012-06-04: 1400 [IU] via INTRAVENOUS_CENTRAL
  Filled 2012-06-04: qty 2

## 2012-06-04 MED ORDER — HEPARIN SODIUM (PORCINE) 1000 UNIT/ML DIALYSIS
300.0000 [IU] | INTRAMUSCULAR | Status: DC | PRN
  Administered 2012-06-04 (×3): 300 [IU] via INTRAVENOUS_CENTRAL
  Filled 2012-06-04: qty 1

## 2012-06-04 MED ORDER — SODIUM CHLORIDE 0.9 % IJ SOLN
INTRAMUSCULAR | Status: AC
  Filled 2012-06-04: qty 3

## 2012-06-04 NOTE — Progress Notes (Signed)
Subjective: Interval History: has no complaint of nausea or vomiting. He complains of swelling of his hand. Patient denies any difficulty increasing..  Objective: Vital signs in last 24 hours: Temp:  [98 F (36.7 C)-98.5 F (36.9 C)] 98.5 F (36.9 C) (09/17 0500) Pulse Rate:  [99-112] 112  (09/17 0500) Resp:  [20] 20  (09/17 0500) BP: (156-180)/(70-111) 180/111 mmHg (09/17 0500) SpO2:  [92 %-95 %] 92 % (09/17 0500) Weight change:   Intake/Output from previous day: 09/16 0701 - 09/17 0700 In: 720 [P.O.:720] Out: 1575 [Urine:1575] Intake/Output this shift:    General appearance: alert, cooperative and no distress Resp: clear to auscultation bilaterally Cardio: regular rate and rhythm, S1, S2 normal, no murmur, click, rub or gallop GI: soft, non-tender; bowel sounds normal; no masses,  no organomegaly Extremities: edema 2+ edema bilaterally upper extremities greater than the lower.  Lab Results:  [facility] 06/02/12 0645  WBC 9.3  HGB 13.5  HCT 38.5*  PLT 130*   BMET:  Basename 06/03/12 0515 06/02/12 0810  NA 134* 131*  K 3.5 3.6  CL 95* 95*  CO2 27 28  GLUCOSE 98 167*  BUN 26* 19  CREATININE 3.84* 3.12*  CALCIUM 8.3* 8.1*   No results found for this basename: PTH:2 in the last 72 hours Iron Studies: No results found for this basename: IRON,TIBC,TRANSFERRIN,FERRITIN in the last 72 hours  Studies/Results: No results found.  I have reviewed the patient'Good current medications.  Assessment/Plan: Problem #1 acute kidney injury presently there is no blood work yesterday his BUN was 26 creatinine 3.84. Patient has this moment is none oliguric. Problem #2 hyponatremia sodium 134 Problem #3 history of hyperkalemia potassium has improved. Problem #4 history of rhabdomyolysis his last CPK was about 5000 as stated above her today they're not able to do blood work. And hence very difficult for her to do with her that has improved. Problem #5 history of polysubstance  abuse Problem #6 history of hepatitis C Problem #7 elevated LFTs. Plan: We'll decrease his IV fluid to [facility] make arrangements for patient to get dialysis today and then have possibly a paternal she split catheter and continue as outpatient dialysis. We'll check his basic metabolic panel in the morning.    LOS: 7 days   [name] 06/04/2012,7:23 AM

## 2012-06-04 NOTE — Progress Notes (Signed)
Dialysis tx initiated without difficulty. NOTE, fem cath has been in place 5 days. Pt. Is new to dialysis, no outpatient plan of care.

## 2012-06-04 NOTE — Clinical Social Work Note (Signed)
CSW spoke with Dr. Kristian Covey regarding dialysis plan. He states he plans to check labs tomorrow and make decision if pt will require outpatient dialysis. CSW will follow up if this is recommended.  Derenda Fennel, [place] [phone]

## 2012-06-04 NOTE — Progress Notes (Signed)
     Subjective: This man is slowly feeling better his legs do not hurt. Unfortunately blood was not obtainable from him this morning. Dr Fausto Skillern is going to give him dialysis today I believe. His arms and legs are swollen.           Physical Exam: Blood pressure 180/111, pulse 112, temperature 98.5 F (36.9 C), temperature source Oral, resp. rate 20, height 5\' 8"  (1.727 m), weight 67.858 kg (149 lb 9.6 oz), SpO2 92.00%. He looks systemically well. He is alert and orientated. His tenderness in both his legs has decreased. Heart sounds are present and normal without murmurs. Lung fields are clear. He has no obvious signs of chronic liver disease. There are no focal neurological signs. He does appear to have pitting peripheral edema in his legs.   Investigations:     Basic Metabolic Panel:  Basename 06/03/12 0515 06/02/12 0810  NA 134* 131*  K 3.5 3.6  CL 95* 95*  CO2 27 28  GLUCOSE 98 167*  BUN 26* 19  CREATININE 3.84* 3.12*  CALCIUM 8.3* 8.1*  MG -- --  PHOS -- 2.2*   Liver Function Tests:  Basename 06/03/12 0900 06/03/12 0515  AST 204* 203*  ALT 221* 221*  ALKPHOS 55 56  BILITOT 0.2* 0.2*  PROT 5.7* 5.6*  ALBUMIN 2.6* 2.6*     CBC:  Basename 06/02/12 0645  WBC 9.3  NEUTROABS --  HGB 13.5  HCT 38.5*  MCV 88.5  PLT 130*        Medications: I have reviewed the patient's current medications.  Impression: 1. Probable rhabdomyolysis. Although, initially I thought this might be polymyositis, on looking at all the data I now believe this is likely to be rhabdomyolysis. This would be in keeping with the acute renal failure and also elevation of liver enzymes. Cocaine is probably going to be the etiology in this case. This is now improving with CPK down to 5100 only. He started out 50,000!. 2. Acute progressive worsening renal failure, now needing dialysis. This is not quite stabilizing yet. 3. Hepatitis C, not a candidate for immunotherapy in view of  his alcoholism. 4. History of cocaine abuse in the past and noncompliance with behavioral therapy appointments.     Plan: 1. Dialysis today. 2. His kidney functions improve significantly, think about discharging him home tomorrow.  LOS: 7 days   Wilson Singer Pager [phone]  06/04/2012, 8:02 AM

## 2012-06-05 DIAGNOSIS — M6282 Rhabdomyolysis: Secondary | ICD-10-CM | POA: Diagnosis not present

## 2012-06-05 DIAGNOSIS — G894 Chronic pain syndrome: Secondary | ICD-10-CM

## 2012-06-05 DIAGNOSIS — I10 Essential (primary) hypertension: Secondary | ICD-10-CM | POA: Diagnosis not present

## 2012-06-05 DIAGNOSIS — N179 Acute kidney failure, unspecified: Secondary | ICD-10-CM | POA: Diagnosis not present

## 2012-06-05 LAB — COMPREHENSIVE METABOLIC PANEL
ALT: 124 U/L — ABNORMAL HIGH (ref 0–53)
AST: 68 U/L — ABNORMAL HIGH (ref 0–37)
Alkaline Phosphatase: 57 U/L (ref 39–117)
CO2: 27 mEq/L (ref 19–32)
Calcium: 8.5 mg/dL (ref 8.4–10.5)
Chloride: 96 mEq/L (ref 96–112)
GFR calc non Af Amer: 23 mL/min — ABNORMAL LOW (ref >90)
Potassium: 3 mEq/L — ABNORMAL LOW (ref 3.5–5.1)
Sodium: 135 mEq/L (ref 135–145)

## 2012-06-05 LAB — CK: Total CK: 1030 U/L — ABNORMAL HIGH (ref 7–232)

## 2012-06-05 MED ORDER — POLYETHYLENE GLYCOL 3350 17 G PO PACK
17.0000 g | PACK | Freq: Every day | ORAL | Status: DC
  Administered 2012-06-05 – 2012-06-08 (×4): 17 g via ORAL
  Filled 2012-06-05 (×4): qty 1

## 2012-06-05 MED ORDER — MILK AND MOLASSES ENEMA: Freq: Once | RECTAL | Status: DC

## 2012-06-05 MED ORDER — POTASSIUM CHLORIDE CRYS ER 20 MEQ PO TBCR
20.0000 meq | EXTENDED_RELEASE_TABLET | Freq: Two times a day (BID) | ORAL | Status: AC
  Administered 2012-06-05 (×2): 20 meq via ORAL
  Filled 2012-06-05 (×2): qty 1

## 2012-06-05 NOTE — Progress Notes (Signed)
Pt had a small bowel movement but c/o constipation.  Small amount of blood in bowel movement which was mixed with urine.  Dr. Kerry Hough paged.  Orders to follow.  Will continue to monitor.

## 2012-06-05 NOTE — Progress Notes (Signed)
[name]  MRN: [id number]  DOB/AGE: [birth year] 46 y.o.  Primary Care Physician:No primary provider on file.  Admit date: 05/28/2012  Chief Complaint:  Chief Complaint  Patient presents with  . Leg Pain  . Arm Pain    S-Pt presented on  05/28/2012 with  Chief Complaint  Patient presents with  . Leg Pain  . Arm Pain  .    Pt today feels better.Pt asking about when can he go home.  Meds      . enoxaparin (LOVENOX) injection  30 mg Subcutaneous Q24H  . folic acid  1 mg Oral Daily  . furosemide  200 mg Intravenous BID  . metolazone  5 mg Oral BID  . multivitamin with minerals  1 tablet Oral Daily  . nicotine  21 mg Transdermal Daily  . thiamine  100 mg Oral Daily      Physical Exam: Vital signs in last 24 hours: Temp:  [98.3 F (36.8 C)-98.6 F (37 C)] 98.4 F (36.9 C) (09/18 0555) Pulse Rate:  [96-110] 96  (09/18 0555) Resp:  [18-20] 18  (09/18 0555) BP: (149-184)/(97-113) 166/97 mmHg (09/18 0555) SpO2:  [90 %-98 %] 95 % (09/18 0555) Weight:  [142 lb 3.2 oz (64.5 kg)-144 lb 10 oz (65.6 kg)] 144 lb 10 oz (65.6 kg) (09/18 0500) Weight change:  Last BM Date: 05/31/12  Intake/Output from previous day: 09/17 0701 - 09/18 0700 In: 670 [I.V.:600; IV Piggyback:70] Out: 5675 [Urine:2175] Total I/O In: 240 [P.O.:240] Out: 875 [Urine:875]   Physical Exam: General- pt is awake,alert, oriented to time place and person Resp- No acute REsp distress, CTA B/L NO Rhonchi CVS- S1S2 regular in rate and rhythm GIT- BS+, soft, NT, ND EXT- NO LE Edema, Cyanosis   Lab Results: CBC  Basename 06/04/12 0750  WBC 8.1  HGB 14.7  HCT 42.6  PLT 172    BMET  Basename 06/05/12 0600 06/04/12 0750  NA 135 132*  K 3.0* 3.6  CL 96 95*  CO2 27 25  GLUCOSE 116* 114*  BUN 11 27*  CREATININE 3.10* 4.48*  CALCIUM 8.5 8.7   Creat  4.4==>3.1( dialyzed yesterday)  Lab Results  Component Value Date   CALCIUM 8.5 06/05/2012   CAION 1.16 02/05/2010   PHOS 4.2 06/04/2012             Impression: 1)Renal   AKI secondary to Rhabdo Pt on HD since 9/13 Pt was dialyzed yesterday NO need of HD today Will follow Bmet  2)HTN Medication- On Diuretics-  3)Anemia HGb at goal (9--11)   4)Liver- hx of  Hep C  Stable  5)HyperKalemia Now better- NOw Hypokalemia  6)Acid base Co2 at goal     Plan: Will replace K NO need of HD Will follow Bmet If Crea increasing in am again-will plan to another temporary catheter vs Permacth. Pt may need upto 3 - 8weeks for ATN to recover. In the interim pt will need outpt  Chair or ALTAC chair-will coordinate with Child psychotherapist.       BHUTANI,MANPREET S 06/05/2012, 11:13 AM

## 2012-06-05 NOTE — Progress Notes (Addendum)
Nutrition Brief Note  Received MD consult for "pt c/o trouble swallowing". Pt reports intermittent hx of this problem. He reports at home he often forgets his drink in the kitchen and does not drink fluids with his meals due to him not returning to retrieve his drink from the kitchen because of "leg problems". He has never had a swallowing evaluation. He reports swallowing problems occur mostly when he does not drink fluids with his meals and when he "takes a big bite" and does not chew adequately. He declines offer of downgrading diet consistency. Counseled pt on importance of eating slowly and chewing food adequately before swallowing to prevent swallowing problems.   Body mass index is 21.99 kg/(m^2). Pt meets criteria for normal weight based on current BMI. Denies any recent weight loss.  Wt Readings from Last 10 Encounters:  06/05/12 144 lb 10 oz (65.6 kg)  04/28/12 134 lb (60.782 kg)  04/27/12 134 lb (60.782 kg)  04/24/12 134 lb 2 oz (60.839 kg)  12/24/11 140 lb (63.504 kg)  12/12/11 139 lb 15.9 oz (63.5 kg)  10/30/11 140 lb (63.504 kg)  10/19/11 134 lb 0.6 oz (60.8 kg)  06/17/10 143 lb 8 oz (65.091 kg)  02/03/10 141 lb 14.4 oz (64.365 kg)   Pt reports fair appetite. Current diet order is regular, patient is consuming approximately 25-100% of meals at this time. Labs and medications reviewed.   No nutrition interventions warranted at this time. If nutrition issues arise, please consult RD.   Melody Haver, RD, LDN Pager: [phone]

## 2012-06-05 NOTE — Progress Notes (Signed)
[facility] PROGRESS NOTE  [name] NFA:[id number] DOB: 07/28/1966 DOA: 05/28/2012 PCP: No primary provider on file.  Assessment/Plan: Active Problems:  Hepatitis C  Alcohol abuse  Chronic pain syndrome  Hyperkalemia  Rhabdomyolysis  Acute renal failure  Hyponatremia  Elevated liver enzymes  1. Rhabdomyolysis. Creatine kinase is improved with dialysis. Etiology is rhabdomyolysis may be alcohol versus cocaine related. 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 It also indicated denervation atrophy, mild. Patient has not received any steroids here in the hospital and has shown clinical improvement. It is possible that his rhabdomyolysis is related to alcohol versus cocaine. Since he reports that this has been a recurrent problem, I think it would be reasonable for him to followup with a rheumatologist at [facility]. 2. Acute renal failure. Patient was started on temporary dialysis. His renal function hasn't improved. The decision to undergo long-term dialysis will be deferred to nephrology. If he does need long-term dialysis , then he will need a permanent catheter placed in his internal jugular. Currently he has a femoral catheter. The 3. Alcohol abuse. No signs of alcohol withdrawal at present. Patient was counseled and reports that he does not intend to return to drinking. She's been seen by Child psychotherapist has declined any available resources. 4. Hyperkalemia. Improved 5. Chronic pain syndrome 6. Elevated liver enzymes, likely related to rhabdomyolysis, improved with dialysis  Code Status: full code Family Communication: discussed with patient, and wife over the phone Disposition Plan: discharge home once medically stable   Brief narrative: This gentleman was brought to the emergency room with complaints of lower leg pain. Patient had bilateral lower extremity pain which is intermittent and chronic in nature. His  current episode started approximately 2 weeks ago it has progressively gotten worse. Labs revealed an elevated creatine kinase greater than 50,000 and an elevated creatinine. Patient was referred for admission  Consultants:  Nephrology  General surgery  Procedures:  None  Antibiotics:  None  HPI/Subjective: Feeling better today, wants to go home, reports pain in his legs has improved  Objective: Filed Vitals:   06/04/12 1553 06/04/12 2058 06/05/12 0500 06/05/12 0555  BP: 174/103 160/101  166/97  Pulse: 110 96  96  Temp: 98.6 F (37 C) 98.5 F (36.9 C)  98.4 F (36.9 C)  TempSrc: Oral Oral  Oral  Resp: 20 20  18   Height:      Weight:   65.6 kg (144 lb 10 oz)   SpO2: 98% 95%  95%    Intake/Output Summary (Last 24 hours) at 06/05/12 1130 Last data filed at 06/05/12 0900  Gross per 24 hour  Intake    910 ml  Output   5875 ml  Net  -4965 ml   Filed Weights   06/04/12 1003 06/04/12 1520 06/05/12 0500  Weight: 68 kg (149 lb 14.6 oz) 64.5 kg (142 lb 3.2 oz) 65.6 kg (144 lb 10 oz)    Exam:   General:  No acute distress  Cardiovascular: S1, S2, regular rate  Respiratory: Clear to auscultation bilaterally  Abdomen: soft, nt, bs+  Neuro: Alert and oriented x3, no tremors  Data Reviewed: Basic Metabolic Panel:  Lab 06/05/12 4696 06/04/12 0750 06/03/12 0515 06/02/12 0810 06/01/12 0452 05/31/12 0011 05/30/12 0511  NA 135 132* 134* 131* 133* -- --  K 3.0* 3.6 3.5 3.6 3.5 -- --  CL 96 95* 95* 95* 94* -- --  CO2 27 25 27 28 24  -- --  GLUCOSE 116* 114* 98 167* 95 -- --  BUN 11 27* 26* 19 31* -- --  CREATININE 3.10* 4.48* 3.84* 3.12* 4.74* -- --  CALCIUM 8.5 8.7 8.3* 8.1* 8.6 -- --  MG -- -- -- -- -- -- --  PHOS -- 4.2 -- 2.2* -- 5.1* 5.2*   Liver Function Tests:  Lab 06/05/12 0600 06/04/12 0750 06/03/12 0900 06/03/12 0515 06/02/12 0810  AST 68* 119* 204* 203* 161*096*  ALT 124* 183* 221* 221* 258*260*  ALKPHOS 57 67 55 56 5861  BILITOT 0.4 0.4 0.2*  0.2* 0.2*0.2*  PROT 5.7* 6.4 5.7* 5.6* 5.6*5.5*  ALBUMIN 2.6* 2.9* 2.6* 2.6* 2.5*2.4*   No results found for this basename: LIPASE:5,AMYLASE:5 in the last 168 hours No results found for this basename: AMMONIA:5 in the last 168 hours CBC:  Lab 06/04/12 0750 06/02/12 0645 06/01/12 0452 05/31/12 0510 05/30/12 0511  WBC 8.1 9.3 6.8 8.5 11.3*  NEUTROABS -- -- -- -- --  HGB 14.7 13.5 14.4 13.3 14.2  HCT 42.6 38.5* 40.5 36.7* 40.3  MCV 88.6 88.5 87.1 87.0 87.2  PLT 172 130* 172 161 152   Cardiac Enzymes:  Lab 06/05/12 0600 06/04/12 0750 06/03/12 0515 06/02/12 0810 06/01/12 0452  CKTOTAL 1030* 2420* 5100* 7618* 13552*  CKMB -- -- -- -- --  CKMBINDEX -- -- -- -- --  TROPONINI -- -- -- -- --   BNP (last 3 results) No results found for this basename: PROBNP:3 in the last 8760 hours CBG: No results found for this basename: GLUCAP:5 in the last 168 hours  No results found for this or any previous visit (from the past 240 hour(s)).   Studies: No results found.  Scheduled Meds:    . enoxaparin (LOVENOX) injection  30 mg Subcutaneous Q24H  . folic acid  1 mg Oral Daily  . furosemide  200 mg Intravenous BID  . metolazone  5 mg Oral BID  . multivitamin with minerals  1 tablet Oral Daily  . nicotine  21 mg Transdermal Daily  . thiamine  100 mg Oral Daily   Continuous Infusions:    . sodium chloride 50 mL/hr at 06/04/12 1809    Active Problems:  Hepatitis C  Alcohol abuse  Chronic pain syndrome  Hyperkalemia  Rhabdomyolysis  Acute renal failure  Hyponatremia  Elevated liver enzymes    Time spent:    MEMON,JEHANZEB  [facility] Pager [phone]. If 7PM-7AM, please contact night-coverage at [email or web address], password [facility] 06/05/2012, 11:30 AM  LOS: 8 days

## 2012-06-06 DIAGNOSIS — E875 Hyperkalemia: Secondary | ICD-10-CM | POA: Diagnosis not present

## 2012-06-06 DIAGNOSIS — N179 Acute kidney failure, unspecified: Secondary | ICD-10-CM | POA: Diagnosis not present

## 2012-06-06 DIAGNOSIS — N189 Chronic kidney disease, unspecified: Secondary | ICD-10-CM | POA: Diagnosis not present

## 2012-06-06 DIAGNOSIS — M6282 Rhabdomyolysis: Secondary | ICD-10-CM | POA: Diagnosis not present

## 2012-06-06 LAB — CBC
HCT: 41.1 % (ref 39.0–52.0)
Hemoglobin: 14.5 g/dL (ref 13.0–17.0)
MCH: 30.9 pg (ref 26.0–34.0)
MCHC: 35.3 g/dL (ref 30.0–36.0)
MCV: 87.4 fL (ref 78.0–100.0)
Platelets: 209 10*3/uL (ref 150–400)
RBC: 4.7 MIL/uL (ref 4.22–5.81)
RDW: 12.8 % (ref 11.5–15.5)
WBC: 11.8 10*3/uL — ABNORMAL HIGH (ref 4.0–10.5)

## 2012-06-06 LAB — COMPREHENSIVE METABOLIC PANEL
Alkaline Phosphatase: 52 U/L (ref 39–117)
BUN: 16 mg/dL (ref 6–23)
GFR calc Af Amer: 20 mL/min — ABNORMAL LOW (ref >90)
Glucose, Bld: 115 mg/dL — ABNORMAL HIGH (ref 70–99)
Potassium: 3.2 mEq/L — ABNORMAL LOW (ref 3.5–5.1)
Total Bilirubin: 0.5 mg/dL (ref 0.3–1.2)
Total Protein: 6.1 g/dL (ref 6.0–8.3)

## 2012-06-06 LAB — PHOSPHORUS: Phosphorus: 4.2 mg/dL (ref 2.3–4.6)

## 2012-06-06 MED ORDER — FUROSEMIDE 10 MG/ML IJ SOLN
100.0000 mg | Freq: Two times a day (BID) | INTRAVENOUS | Status: DC
  Administered 2012-06-06 – 2012-06-07 (×2): 100 mg via INTRAVENOUS
  Filled 2012-06-06 (×2): qty 10

## 2012-06-06 MED ORDER — FUROSEMIDE 10 MG/ML IJ SOLN
INTRAMUSCULAR | Status: AC
  Filled 2012-06-06: qty 10

## 2012-06-06 MED ORDER — POTASSIUM CHLORIDE IN NACL 20-0.45 MEQ/L-% IV SOLN
INTRAVENOUS | Status: DC
  Administered 2012-06-06 (×2): via INTRAVENOUS
  Administered 2012-06-07: 1000 mL via INTRAVENOUS
  Filled 2012-06-06 (×4): qty 1000

## 2012-06-06 NOTE — Progress Notes (Signed)
Pt's IV site WNL.  Pt is difficult stick.  Verbal order received to continue current IV site access d/t anticipated d/c tomorrow.

## 2012-06-06 NOTE — CV Procedure (Signed)
HD catheter removal.  Discussed with the patient.    Sutures removed.  Catheter removed without issues.  Pressure held ~74min  No evidence of bleeding.  Dressing placed.

## 2012-06-06 NOTE — Progress Notes (Signed)
Subjective: Interval History: has no complaint of difficulty in breathing. He denies any nausea no vomiting. Overall her history is feeling better..  Objective: Vital signs in last 24 hours: Temp:  [98.4 F (36.9 C)-98.8 F (37.1 C)] 98.4 F (36.9 C) (09/19 0524) Pulse Rate:  [84-90] 84  (09/19 0524) Resp:  [19-20] 20  (09/19 0524) BP: (151-167)/(89-100) 151/94 mmHg (09/19 0524) SpO2:  [95 %-97 %] 95 % (09/19 0524) Weight:  [63.8 kg (140 lb 10.5 oz)] 63.8 kg (140 lb 10.5 oz) (09/19 0524) Weight change: -4.2 kg (-9 lb 4.1 oz)  Intake/Output from previous day: 09/18 0701 - 09/19 0700 In: 1270 [P.O.:720; I.V.:550] Out: 2375 [Urine:2375] Intake/Output this shift:    General appearance: alert, cooperative and no distress  Lab Results:  Basename 06/06/12 0537 06/04/12 0750  WBC 11.8* 8.1  HGB 14.5 14.7  HCT 41.1 42.6  PLT 209 172   BMET:  Basename 06/06/12 0537 06/05/12 0600  NA 133* 135  K 3.2* 3.0*  CL 93* 96  CO2 29 27  GLUCOSE 115* 116*  BUN 16 11  CREATININE 3.82* 3.10*  CALCIUM 8.8 8.5   No results found for this basename: PTH:2 in the last 72 hours Iron Studies: No results found for this basename: IRON,TIBC,TRANSFERRIN,FERRITIN in the last 72 hours  Studies/Results: No results found.  I have reviewed the patient's current medications.  Assessment/Plan: Problem #1 acute kidney injury secondary to rhabdomyolysis his pedis 16 creatinine 3.82 status post hemodialysis on Tuesday. Presently she doesn't have any uremic sinus symptoms. Problem #2 rhabdomyolysis his CPK has improved is presently less than 1000. Problem #3 history of hypokalemia possibly to diuretic use his potassium is 3.2 low but better. his urine output seems to  Problem #4 history of hyponatremia sodium was history be getting better. Problem #5 history of hepatitis C Problem #6 history of elevated liver function Problem #7 history of polysubstance abuse. Plan: We'll DC today is femoral  catheter We'll check his basic metabolic panel in the morning and if his renal function remains stable we'll discontinue his dialysis. If her his BUN and creatinine continues to increase probably will send him to have an IJ catheter for possible outpatient dialysis.  We'll increase his IV fluid to 125 cc per hour and add 20 mg of KCl. We'll decrease Lasix to 100 mg IV twice a day   LOS: 9 days   [name] S 06/06/2012,7:16 AM

## 2012-06-06 NOTE — Progress Notes (Signed)
Pt complaining of sore throat. Dr. Kerry Hough paged.

## 2012-06-06 NOTE — Progress Notes (Signed)
[facility] PROGRESS NOTE  [name] ZOX:[id number] DOB: [birth year] DOA: 05/28/2012 PCP: No primary provider on file.  Assessment/Plan: Active Problems:  Hepatitis C  Alcohol abuse  Chronic pain syndrome  Hyperkalemia  Rhabdomyolysis  Acute renal failure  Hyponatremia  Elevated liver enzymes  1. Rhabdomyolysis. Creatine kinase is improved with dialysis. Etiology is rhabdomyolysis may be alcohol versus cocaine related. 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 It also indicated denervation atrophy, mild. Patient has not received any steroids here in the hospital and has shown clinical improvement. It is possible that his rhabdomyolysis is related to alcohol versus cocaine. Since he reports that this has been a recurrent problem, I think it would be reasonable for him to followup with a rheumatologist at [facility]. 2. Acute renal failure. Patient was started on temporary dialysis. His renal function has improved. Currently, we are following renal function on a daily basis. The decision to undergo long-term dialysis will be deferred to nephrology. If he does need long-term dialysis , then he will need a permanent catheter placed in his internal jugular. Femoral catheter was removed today. 3. Alcohol abuse. No signs of alcohol withdrawal at present. Patient was counseled and reports that he does not intend to return to drinking. He's been seen by social worker has declined any available resources. 4. Hyperkalemia. Improved 5. Chronic pain syndrome 6. Elevated liver enzymes, likely related to rhabdomyolysis, improved with dialysis  Code Status: full code Family Communication: discussed with patient, and wife over the phone Disposition Plan: discharge home once medically stable   Brief narrative: This gentleman was brought to the emergency room with complaints of lower leg pain. Patient had bilateral lower extremity pain  which is intermittent and chronic in nature. His current episode started approximately 2 weeks ago it has progressively gotten worse. Labs revealed an elevated creatine kinase greater than 50,000 and an elevated creatinine. Patient was referred for admission  Consultants:  Nephrology  General surgery  Procedures:  None  Antibiotics:  None  HPI/Subjective: Feeling better, no new complaints.  Objective: Filed Vitals:   06/05/12 1356 06/05/12 2101 06/06/12 0524 06/06/12 1400  BP: 153/89 167/100 151/94 149/90  Pulse: 85 90 84 80  Temp: 98.6 F (37 C) 98.8 F (37.1 C) 98.4 F (36.9 C) 98.5 F (36.9 C)  TempSrc:  Oral Oral   Resp: 19 20 20 20   Height:      Weight:   63.8 kg (140 lb 10.5 oz)   SpO2: 97% 96% 95% 97%    Intake/Output Summary (Last 24 hours) at 06/06/12 1723 Last data filed at 06/06/12 1144  Gross per 24 hour  Intake   1030 ml  Output   2600 ml  Net  -1570 ml   Filed Weights   06/04/12 1520 06/05/12 0500 06/06/12 0524  Weight: 64.5 kg (142 lb 3.2 oz) 65.6 kg (144 lb 10 oz) 63.8 kg (140 lb 10.5 oz)    Exam:   General:  No acute distress  Cardiovascular: S1, S2, regular rate  Respiratory: Clear to auscultation bilaterally  Abdomen: soft, nt, bs+  Neuro: Alert and oriented x3, no tremors  Data Reviewed: Basic Metabolic Panel:  Lab 06/06/12 8119 06/05/12 0600 06/04/12 0750 06/03/12 0515 06/02/12 0810 05/31/12 0011  NA 133* 135 132* 134* 131* --  K 3.2* 3.0* 3.6 3.5 3.6 --  CL 93* 96 95* 95* 95* --  CO2 29 27 25 27 28  --  GLUCOSE  115* 116* 114* 98 167* --  BUN 16 11 27* 26* 19 --  CREATININE 3.82* 3.10* 4.48* 3.84* 3.12* --  CALCIUM 8.8 8.5 8.7 8.3* 8.1* --  MG -- -- -- -- -- --  PHOS 4.2 -- 4.2 -- 2.2* 5.1*   Liver Function Tests:  Lab 06/06/12 0537 06/05/12 0600 06/04/12 0750 06/03/12 0900 06/03/12 0515  AST 50* 68* 119* 204* 203*  ALT 100* 124* 183* 221* 221*  ALKPHOS 52 57 67 55 56  BILITOT 0.5 0.4 0.4 0.2* 0.2*  PROT 6.1 5.7*  6.4 5.7* 5.6*  ALBUMIN 2.7* 2.6* 2.9* 2.6* 2.6*   No results found for this basename: LIPASE:5,AMYLASE:5 in the last 168 hours No results found for this basename: AMMONIA:5 in the last 168 hours CBC:  Lab 06/06/12 0537 06/04/12 0750 06/02/12 0645 06/01/12 0452 05/31/12 0510  WBC 11.8* 8.1 9.3 6.8 8.5  NEUTROABS -- -- -- -- --  HGB 14.5 14.7 13.5 14.4 13.3  HCT 41.1 42.6 38.5* 40.5 36.7*  MCV 87.4 88.6 88.5 87.1 87.0  PLT 209 172 130* 172 161   Cardiac Enzymes:  Lab 06/06/12 0537 06/05/12 0600 06/04/12 0750 06/03/12 0515 06/02/12 0810  CKTOTAL 723* 1030* 2420* 5100* 7618*  CKMB -- -- -- -- --  CKMBINDEX -- -- -- -- --  TROPONINI -- -- -- -- --   BNP (last 3 results) No results found for this basename: PROBNP:3 in the last 8760 hours CBG: No results found for this basename: GLUCAP:5 in the last 168 hours  No results found for this or any previous visit (from the past 240 hour(s)).   Studies: No results found.  Scheduled Meds:    . enoxaparin (LOVENOX) injection  30 mg Subcutaneous Q24H  . folic acid  1 mg Oral Daily  . furosemide  100 mg Intravenous BID  . metolazone  5 mg Oral BID  . milk and molasses   Rectal Once  . multivitamin with minerals  1 tablet Oral Daily  . nicotine  21 mg Transdermal Daily  . polyethylene glycol  17 g Oral Daily  . potassium chloride SA  20 mEq Oral BID  . thiamine  100 mg Oral Daily  . DISCONTD: furosemide  200 mg Intravenous BID   Continuous Infusions:    . 0.45 % NaCl with KCl 20 mEq / L 125 mL/hr at 06/06/12 1026  . DISCONTD: sodium chloride 50 mL/hr at 06/05/12 1800    Active Problems:  Hepatitis C  Alcohol abuse  Chronic pain syndrome  Hyperkalemia  Rhabdomyolysis  Acute renal failure  Hyponatremia  Elevated liver enzymes    Time spent:    [facility]  [facility] Pager [phone]. If 7PM-7AM, please contact night-coverage at [email or web address], password [facility] 06/06/2012, 5:23 PM  LOS: 9 days

## 2012-06-07 DIAGNOSIS — N179 Acute kidney failure, unspecified: Secondary | ICD-10-CM | POA: Diagnosis not present

## 2012-06-07 DIAGNOSIS — E875 Hyperkalemia: Secondary | ICD-10-CM | POA: Diagnosis not present

## 2012-06-07 DIAGNOSIS — M6282 Rhabdomyolysis: Secondary | ICD-10-CM | POA: Diagnosis not present

## 2012-06-07 LAB — COMPREHENSIVE METABOLIC PANEL
ALT: 80 U/L — ABNORMAL HIGH (ref 0–53)
AST: 41 U/L — ABNORMAL HIGH (ref 0–37)
Albumin: 2.7 g/dL — ABNORMAL LOW (ref 3.5–5.2)
Alkaline Phosphatase: 56 U/L (ref 39–117)
BUN: 21 mg/dL (ref 6–23)
Chloride: 83 mEq/L — ABNORMAL LOW (ref 96–112)
Potassium: 3.5 mEq/L (ref 3.5–5.1)
Sodium: 123 mEq/L — ABNORMAL LOW (ref 135–145)
Total Protein: 6 g/dL (ref 6.0–8.3)

## 2012-06-07 LAB — CK: Total CK: 499 U/L — ABNORMAL HIGH (ref 7–232)

## 2012-06-07 LAB — CRYOGLOBULIN

## 2012-06-07 MED ORDER — SODIUM CHLORIDE 0.9 % IV SOLN
INTRAVENOUS | Status: DC
  Administered 2012-06-07 (×2): via INTRAVENOUS

## 2012-06-07 NOTE — Progress Notes (Signed)
[facility] PROGRESS NOTE  STEN DEMATTEO ZOX:[id number] DOB: 01/29/1966 DOA: 05/28/2012 PCP: No primary provider on file.  Assessment/Plan: Active Problems:  Hepatitis C  Alcohol abuse  Chronic pain syndrome  Hyperkalemia  Rhabdomyolysis  Acute renal failure  Hyponatremia  Elevated liver enzymes  1. Rhabdomyolysis. Creatine kinase is improved with dialysis. Etiology of rhabdomyolysis may be alcohol versus cocaine related. 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 It also indicated denervation atrophy, mild. Patient has not received any steroids here in the hospital and has shown clinical improvement. It is possible that his rhabdomyolysis is related to alcohol versus cocaine. Since he reports that this has been a recurrent problem, I think it would be reasonable for him to followup with a rheumatologist at [facility]. 2. Acute renal failure. Patient was started on temporary dialysis. His renal function is stabilizing. Currently, we are following renal function on a daily basis. The decision to undergo long-term dialysis will be deferred to nephrology. If he does need long-term dialysis , then he will need a permanent catheter placed in his internal jugular. Femoral catheter was removed. 3. Hyponatremia, likely hypovolemic.  Patient is on IV fluids, repeat in AM 4. Alcohol abuse. No signs of alcohol withdrawal at present. Patient was counseled and reports that he does not intend to return to drinking. He's been seen by social worker has declined any available resources. 5. Hyperkalemia. Improved 6. Chronic pain syndrome 7. Elevated liver enzymes, likely related to rhabdomyolysis, improved with dialysis  Code Status: full code Family Communication: discussed with patient, and wife over the phone Disposition Plan: discharge home once medically stable   Brief narrative: This gentleman was brought to the emergency room with  complaints of lower leg pain. Patient had bilateral lower extremity pain which is intermittent and chronic in nature. His current episode started approximately 2 weeks ago it has progressively gotten worse. Labs revealed an elevated creatine kinase greater than 50,000 and an elevated creatinine. Patient was referred for admission  Consultants:  Nephrology  General surgery  Procedures:  None  Antibiotics:  None  HPI/Subjective: Feeling better, no new complaints. Very adamant about going home.  Objective: Filed Vitals:   06/06/12 0524 06/06/12 1400 06/06/12 2156 06/07/12 0500  BP: 151/94 149/90 153/96 154/87  Pulse: 84 80 84 75  Temp: 98.4 F (36.9 C) 98.5 F (36.9 C) 98.2 F (36.8 C) 98.3 F (36.8 C)  TempSrc: Oral  Oral Oral  Resp: 20 20 18 20   Height:      Weight: 63.8 kg (140 lb 10.5 oz)   65.3 kg (143 lb 15.4 oz)  SpO2: 95% 97% 95% 96%    Intake/Output Summary (Last 24 hours) at 06/07/12 1052 Last data filed at 06/07/12 0931  Gross per 24 hour  Intake   1290 ml  Output   3000 ml  Net  -1710 ml   Filed Weights   06/05/12 0500 06/06/12 0524 06/07/12 0500  Weight: 65.6 kg (144 lb 10 oz) 63.8 kg (140 lb 10.5 oz) 65.3 kg (143 lb 15.4 oz)    Exam:   General:  No acute distress  Cardiovascular: S1, S2, regular rate  Respiratory: Clear to auscultation bilaterally  Abdomen: soft, nt, bs+  Neuro: Alert and oriented x3  Data Reviewed: Basic Metabolic Panel:  Lab 06/07/12 8119 06/06/12 0537 06/05/12 0600 06/04/12 0750 06/03/12 0515 06/02/12 0810  NA 123* 133* 135 132* 134* --  K 3.5 3.2* 3.0* 3.6  3.5 --  CL 83* 93* 96 95* 95* --  CO2 28 29 27 25 27  --  GLUCOSE 140* 115* 116* 114* 98 --  BUN 21 16 11  27* 26* --  CREATININE 4.19* 3.82* 3.10* 4.48* 3.84* --  CALCIUM 8.9 8.8 8.5 8.7 8.3* --  MG -- -- -- -- -- --  PHOS -- 4.2 -- 4.2 -- 2.2*   Liver Function Tests:  Lab 06/07/12 0535 06/06/12 0537 06/05/12 0600 06/04/12 0750 06/03/12 0900  AST 41* 50*  68* 119* 204*  ALT 80* 100* 124* 183* 221*  ALKPHOS 56 52 57 67 55  BILITOT 0.5 0.5 0.4 0.4 0.2*  PROT 6.0 6.1 5.7* 6.4 5.7*  ALBUMIN 2.7* 2.7* 2.6* 2.9* 2.6*   No results found for this basename: LIPASE:5,AMYLASE:5 in the last 168 hours No results found for this basename: AMMONIA:5 in the last 168 hours CBC:  Lab 06/06/12 0537 06/04/12 0750 06/02/12 0645 06/01/12 0452  WBC 11.8* 8.1 9.3 6.8  NEUTROABS -- -- -- --  HGB 14.5 14.7 13.5 14.4  HCT 41.1 42.6 38.5* 40.5  MCV 87.4 88.6 88.5 87.1  PLT 209 172 130* 172   Cardiac Enzymes:  Lab 06/07/12 0535 06/06/12 0537 06/05/12 0600 06/04/12 0750 06/03/12 0515  CKTOTAL 499* 723* 1030* 2420* 5100*  CKMB -- -- -- -- --  CKMBINDEX -- -- -- -- --  TROPONINI -- -- -- -- --   BNP (last 3 results) No results found for this basename: PROBNP:3 in the last 8760 hours CBG: No results found for this basename: GLUCAP:5 in the last 168 hours  No results found for this or any previous visit (from the past 240 hour(s)).   Studies: No results found.  Scheduled Meds:    . enoxaparin (LOVENOX) injection  30 mg Subcutaneous Q24H  . folic acid  1 mg Oral Daily  . milk and molasses   Rectal Once  . multivitamin with minerals  1 tablet Oral Daily  . nicotine  21 mg Transdermal Daily  . polyethylene glycol  17 g Oral Daily  . thiamine  100 mg Oral Daily  . DISCONTD: furosemide  100 mg Intravenous BID  . DISCONTD: metolazone  5 mg Oral BID   Continuous Infusions:    . sodium chloride 125 mL/hr at 06/07/12 0818  . DISCONTD: 0.45 % NaCl with KCl 20 mEq / L 1,000 mL (06/07/12 0349)    Active Problems:  Hepatitis C  Alcohol abuse  Chronic pain syndrome  Hyperkalemia  Rhabdomyolysis  Acute renal failure  Hyponatremia  Elevated liver enzymes    Time spent:    [facility]  [facility] Pager [phone]. If 7PM-7AM, please contact night-coverage at [email or web address], password [facility] 06/07/2012, 10:52 AM  LOS: 10 days

## 2012-06-07 NOTE — Progress Notes (Signed)
Subjective: Interval History: has no complaint of difficulty in breathing. His appetite is good he does have any nausea vomiting..  Objective: Vital signs in last 24 hours: Temp:  [98.2 F (36.8 C)-98.5 F (36.9 C)] 98.3 F (36.8 C) (09/20 0500) Pulse Rate:  [75-84] 75  (09/20 0500) Resp:  [18-20] 20  (09/20 0500) BP: (149-154)/(87-96) 154/87 mmHg (09/20 0500) SpO2:  [95 %-97 %] 96 % (09/20 0500) Weight:  [65.3 kg (143 lb 15.4 oz)] 65.3 kg (143 lb 15.4 oz) (09/20 0500) Weight change: 1.5 kg (3 lb 4.9 oz)  Intake/Output from previous day: 09/19 0701 - 09/20 0700 In: 1050 [I.V.:1050] Out: 3000 [Urine:3000] Intake/Output this shift:    General appearance: alert, cooperative and no distress  Lab Results:  [facility] 06/06/12 0537  WBC 11.8*  HGB 14.5  HCT 41.1  PLT 209   BMET:  Basename 06/07/12 0535 06/06/12 0537  NA 123* 133*  K 3.5 3.2*  CL 83* 93*  CO2 28 29  GLUCOSE 140* 115*  BUN 21 16  CREATININE 4.19* 3.82*  CALCIUM 8.9 8.8   No results found for this basename: PTH:2 in the last 72 hours Iron Studies: No results found for this basename: IRON,TIBC,TRANSFERRIN,FERRITIN in the last 72 hours  Studies/Results: No results found.  I have reviewed the patient's current medications.  Assessment/Plan: Problem #1 acute kidney injury his BUN is 21 creatinine 4.19 renal function still doesn't seems to be stabilizing. However increasing BUN and creatinine seems to be moderate. His urine output seems to be picking up has about 3 L the last 24 hours. Problem #2 hypokalemia potassium is 3.5 has improved. Problem #3 hyponatremia most likely secondary to hypovolemic hyponatremia with increased free water intake. Problem #4 history of rhabdomyolysis Problem #5 history of hepatitis C infection. Problem #6 history of elevated liver function enzymes has improved. Problem #7 history of polysubstance abuse. Plan: We'll DC Lasix We'll change his IV fluid to normal saline at 135  cc per hour We'll put him on freewater restriction liberalize his salt intake. We'll check his basic metabolic panel in the morning he was renal function is stable patient could be discharged to follow as outpatient.   LOS: 10 days   Kati Riggenbach S 06/07/2012,7:59 AM

## 2012-06-08 DIAGNOSIS — E875 Hyperkalemia: Secondary | ICD-10-CM | POA: Diagnosis not present

## 2012-06-08 DIAGNOSIS — M6282 Rhabdomyolysis: Secondary | ICD-10-CM | POA: Diagnosis not present

## 2012-06-08 DIAGNOSIS — N179 Acute kidney failure, unspecified: Secondary | ICD-10-CM | POA: Diagnosis not present

## 2012-06-08 LAB — COMPREHENSIVE METABOLIC PANEL
AST: 36 U/L (ref 0–37)
CO2: 31 mEq/L (ref 19–32)
Chloride: 84 mEq/L — ABNORMAL LOW (ref 96–112)
Creatinine, Ser: 4.52 mg/dL — ABNORMAL HIGH (ref 0.50–1.35)
GFR calc Af Amer: 17 mL/min — ABNORMAL LOW (ref >90)
GFR calc non Af Amer: 14 mL/min — ABNORMAL LOW (ref >90)
Glucose, Bld: 87 mg/dL (ref 70–99)
Total Bilirubin: 0.4 mg/dL (ref 0.3–1.2)

## 2012-06-08 LAB — CK: Total CK: 419 U/L — ABNORMAL HIGH (ref 7–232)

## 2012-06-08 MED ORDER — POTASSIUM CHLORIDE CRYS ER 20 MEQ PO TBCR
40.0000 meq | EXTENDED_RELEASE_TABLET | Freq: Two times a day (BID) | ORAL | Status: DC
Start: 2012-06-08 — End: 2012-06-08

## 2012-06-08 MED ORDER — POTASSIUM CHLORIDE CRYS ER 20 MEQ PO TBCR
40.0000 meq | EXTENDED_RELEASE_TABLET | Freq: Once | ORAL | Status: AC
  Administered 2012-06-08: 40 meq via ORAL
  Filled 2012-06-08: qty 2

## 2012-06-08 NOTE — Progress Notes (Signed)
Subjective: Interval History: has no complaint of nausea or vomiting. He states that he's feeling better..  Objective: Vital signs in last 24 hours: Temp:  [98.4 F (36.9 C)-98.7 F (37.1 C)] 98.5 F (36.9 C) (09/21 0500) Pulse Rate:  [86-104] 86  (09/21 0500) Resp:  [16-20] 16  (09/21 0500) BP: (135-155)/(86-98) 135/86 mmHg (09/21 0500) SpO2:  [95 %-97 %] 96 % (09/21 0500) Weight:  [65.1 kg (143 lb 8.3 oz)] 65.1 kg (143 lb 8.3 oz) (09/21 0500) Weight change: -0.2 kg (-7.1 oz)  Intake/Output from previous day: 09/20 0701 - 09/21 0700 In: 2667.5 [P.O.:1080; I.V.:1587.5] Out: 1950 [Urine:1950] Intake/Output this shift: Total I/O In: -  Out: 300 [Urine:300]  General appearance: alert, cooperative and no distress Neck: no adenopathy, no carotid bruit, no JVD, supple, symmetrical, trachea midline and thyroid not enlarged, symmetric, no tenderness/mass/nodules Resp: clear to auscultation bilaterally Cardio: regular rate and rhythm, S1, S2 normal, no murmur, click, rub or gallop GI: soft, non-tender; bowel sounds normal; no masses,  no organomegaly Extremities: extremities normal, atraumatic, no cyanosis or edema  Lab Results:  [facility] 06/06/12 0537  WBC 11.8*  HGB 14.5  HCT 41.1  PLT 209   BMET:  Basename 06/08/12 0537 06/07/12 0535  NA 125* 123*  K 3.3* 3.5  CL 84* 83*  CO2 31 28  GLUCOSE 87 140*  BUN 27* 21  CREATININE 4.52* 4.19*  CALCIUM 8.7 8.9   No results found for this basename: PTH:2 in the last 72 hours Iron Studies: No results found for this basename: IRON,TIBC,TRANSFERRIN,FERRITIN in the last 72 hours  Studies/Results: No results found.  I have reviewed the patient's current medications.  Assessment/Plan: Problem #1 acute kidney injury presently none oliguric his pedis 47 creatinine is 4.52 renal function continued to get worse. Presently her patient however is a symptomatic. Problem #2 hypokalemia potassium 3.3 declining Problem #3 hyponatremia  sodium 125 improving. Problem #4 history of hepatitis C Problem #5 history of chronic pain syndrome Problem #6 history of rhabdomyolysis CPK has improved and he is a symptomatic Problem #7 history of elevated liver function tests has improved. Problem #8 history of polysubstance abuse. Plan: In view of increasing creatinine we'll put IJ dialysis catheter and make arrangements for outpatient dialysis. We'll replace his potassium orally. We'll check his basic metabolic panel, CBC in the morning.   LOS: 11 days   Dreya Buhrman S 06/08/2012,10:35 AM

## 2012-06-08 NOTE — Progress Notes (Signed)
Pt has decided to leave against medical advice.  Dr Kerry Hough in to speak with pt about the dangers of leaving  Dr Kerry Hough contacted pt family

## 2012-06-08 NOTE — Discharge Summary (Signed)
Physician Discharge Summary  [name] ZOX:[id number] DOB: 14-Sep-1966 DOA: 05/28/2012  PCP: No primary provider on file.  Admit date: 05/28/2012 Discharge date: 06/08/2012  PATIENT SIGNED OUT FROM THE HOSPITAL AGAINST MEDICAL ADVICE  Discharge Diagnoses:    Rhabdomyolysis Active Problems:  Hepatitis C  Alcohol abuse  Chronic pain syndrome  Hyperkalemia   Acute renal failure requiring hemodialysis  Hyponatremia, likely due to hypovolemia  Elevated liver enzymes, likely due to rhabdo, trending down    [employer]   06/06/12 0524 06/07/12 0500 06/08/12 0500  Weight: 63.8 kg (140 lb 10.5 oz) 65.3 kg (143 lb 15.4 oz) 65.1 kg (143 lb 8.3 oz)    History of present illness:  [name] is a 46 y.o. 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 Patient presents to the emergency room with complaints of bilateral lower extremity pain. He's had this pain on and off for many many years now. He reports his current episode started approximately 2 weeks ago progressively getting worse. He has pain primarily in his calves bilaterally and also in his thighs. He also has pain in his upper extremities mostly his forearms and hands but also in his upper arms as well. His pain got to the point where he was practically unable to walk. He denies any other recent vomiting, diarrhea, abdominal pain, fever, shortness of breath or cough. He was evaluated in the emergency room where he was found to have an elevated creatine kinase 50,000 as well as an elevated creatinine of 1.6. He had multiple other metabolic derangements. He's not on any prescribed medication at home. The only thing he takes is ibuprofen and a multivitamin. He denies any other over-the-counter agents or herbal medications. He's not had any recent trauma. He has not noted any swelling in his extremities. He's not had any fever or evidence of a viral syndrome. He has not used  any illicit drugs. He's not had prolonged exposure in the sun/heat exhaustion. Patient will be admitted for further treatments.   Hospital Course:  This gentleman has had a prolonged hospital course and unfortunately ended up leaving AGAINST MEDICAL ADVICE today. He initially presents to the hospital with complaints of bilateral lower extremity calf pain and generalized weakness. He was found to have an elevated creatine kinase greater than 50,000 as well as acute renal failure with a creatinine of 1.6. He has a history of substance abuse including alcohol and cocaine. He reports a recurrent history of rhabdomyolysis and has been seen by both [facility] as well as [facility]. He had multiple muscle biopsy in the past but does not formally carry any specific diagnosis. One of his biopsy reports were reviewed and there was question of a mild inflammatory myopathy, the patient has not been on any anti-inflammatory/immune modulating drugs.  Patient was admitted to the hospital and was started on IV fluids. He was seen in consultation by the nephrology service who followed his renal function closely. Unfortunately his renal function did not improve with IV fluids and he did require temporary hemodialysis. Hemodialysis catheter was placed by the surgical service. Patient underwent multiple treatments with hemodialysis and did have improvement in his renal function. His creatine kinase trended back down to normal range. Symptomatically patient improved significantly, he no longer had any extremity pain. The etiology of his rhabdomyolysis was not entirely clear, although it was thought that it may be related to alcohol versus cocaine.  It was recommended that patient see a rheumatologist at [facility] for any further workup, especially since they are familiar with him. Towards the end of his hospitalization, hemodialysis was stopped, dialysis catheter was removed and the patient's renal  function was observed for further recovery. Unfortunately his creatinine continued to trend up and therefore the decision was made to place an internal jugular dialysis catheter for longer term hemodialysis. The patient did not wish to wait for this procedure and wanted to leave the hospital AGAINST MEDICAL ADVICE. He was explained the risks and dangers of doing so and verbalized understanding. Since he left the hospital under these circumstances, his medications were not reconciled and he did not receive any discharge instructions or followup.  Procedures:  Femoral dialysis catheter placement  Consultations:  Nephrology, Dr. Kristian Covey  Surgery, Dr. Lovell Sheehan for dialysis catheter placement    The results of significant diagnostics from this hospitalization (including imaging, microbiology, ancillary and laboratory) are listed below for reference.    Significant Diagnostic Studies: US Renal  05/29/2012  *RADIOLOGY REPORT*  Clinical Data:  Acute renal failure, there are urine  RENAL/URINARY TRACT ULTRASOUND COMPLETE  Comparison:  None.  Findings:  Right Kidney:  The renal parenchyma is echogenic with respect to the liver and there is increased corticomedullary differentiation. Kidneys are upper limits of normal for size at 13.2 cm in length. No evidence of mass or hydronephrosis.  Left Kidney:  The renal parenchyma is echogenic and there is increased corticomedullary differentiation.  Upper limit of normal for size at 14 cm in length. No evidence of mass or hydronephrosis.  Bladder:  Appears normal for degree of bladder distention.  IMPRESSION:  1.  Echogenic renal parenchyma bilaterally with increased corticomedullary differentiation suggesting underlying medical renal disease.  Given that the kidneys are at the upper limits of normal for size, HIV nephropathy is a possible differential consideration.  2.  No evidence of hydronephrosis.   Original Report Authenticated By: Vilma Prader    US Venous Img  Lower Bilateral  05/29/2012  *RADIOLOGY REPORT*  Clinical Data: Bilateral leg pain, elevated CK enzymes, smoker  BILATERAL LOWER EXTREMITY VENOUS DUPLEX ULTRASOUND  Technique:  Gray-scale sonography with graded compression, as well as color Doppler and duplex ultrasound, were performed to evaluate the deep venous system of both lower extremities from the level of the common femoral vein through the popliteal and proximal calf veins.  Spectral Doppler was utilized to evaluate flow at rest and with distal augmentation maneuvers.  Comparison:  None.  Findings:  Normal compressibility of bilateral common femoral, superficial femoral, and popliteal veins is demonstrated, as well as the visualized proximal calf veins.  No filling defects to suggest DVT on grayscale or color Doppler imaging.  Doppler waveforms show normal direction of venous flow, normal respiratory phasicity and response to augmentation.  Included views of the gastrocnemius musculature demonstrates strandy edema throughout the muscle fibers bilaterally, worse on the right. This is nonspecific by ultrasound but can be seen with muscle strain, injury, or inflammation/edema.  IMPRESSION: No evidence of deep vein thrombosis in either lower extremity.   Original Report Authenticated By: Judie Petit. Ruel Favors, M.D.     Microbiology: No results found for this or any previous visit (from the past 240 hour(s)).   Labs: Basic Metabolic Panel:  Lab 06/08/12 5784 06/07/12 0535 06/06/12 0537 06/05/12 0600 06/04/12 0750 06/02/12 0810  NA 125* 123* 133* 135 132* --  K 3.3* 3.5 3.2* 3.0* 3.6 --  CL 84* 83* 93* 96  95* --  CO2 31 28 29 27 25  --  GLUCOSE 87 140* 115* 116* 114* --  BUN 27* 21 16 11  27* --  CREATININE 4.52* 4.19* 3.82* 3.10* 4.48* --  CALCIUM 8.7 8.9 8.8 8.5 8.7 --  MG -- -- -- -- -- --  PHOS -- -- 4.2 -- 4.2 2.2*   Liver Function Tests:  Lab 06/08/12 0537 06/07/12 0535 06/06/12 0537 06/05/12 0600 06/04/12 0750  AST 36 41* 50* 68* 119*    ALT 64* 80* 100* 124* 183*  ALKPHOS 53 56 52 57 67  BILITOT 0.4 0.5 0.5 0.4 0.4  PROT 6.0 6.0 6.1 5.7* 6.4  ALBUMIN 2.9* 2.7* 2.7* 2.6* 2.9*   No results found for this basename: LIPASE:5,AMYLASE:5 in the last 168 hours No results found for this basename: AMMONIA:5 in the last 168 hours CBC:  Lab 06/06/12 0537 06/04/12 0750 06/02/12 0645  WBC 11.8* 8.1 9.3  NEUTROABS -- -- --  HGB 14.5 14.7 13.5  HCT 41.1 42.6 38.5*  MCV 87.4 88.6 88.5  PLT 209 172 130*   Cardiac Enzymes:  Lab 06/08/12 0537 06/07/12 0535 06/06/12 0537 06/05/12 0600 06/04/12 0750  CKTOTAL 419* 499* 723* 1030* 2420*  CKMB -- -- -- -- --  CKMBINDEX -- -- -- -- --  TROPONINI -- -- -- -- --   BNP: BNP (last 3 results) No results found for this basename: PROBNP:3 in the last 8760 hours CBG: No results found for this basename: GLUCAP:5 in the last 168 hours  Time coordinating discharge:25 minutes  Signed:  MEMON,JEHANZEB  [facility] 06/08/2012, 5:43 PM

## 2012-06-09 ENCOUNTER — Emergency Department (HOSPITAL_COMMUNITY)
Admission: EM | Admit: 2012-06-09 | Discharge: 2012-06-10 | Disposition: A | Discharge status: Home / Self Care | Payer: Medicare Other | Attending: Emergency Medicine | Admitting: Emergency Medicine

## 2012-06-09 ENCOUNTER — Encounter (HOSPITAL_COMMUNITY): Payer: Self-pay

## 2012-06-09 DIAGNOSIS — G894 Chronic pain syndrome: Secondary | ICD-10-CM | POA: Diagnosis not present

## 2012-06-09 DIAGNOSIS — R609 Edema, unspecified: Secondary | ICD-10-CM | POA: Diagnosis not present

## 2012-06-09 DIAGNOSIS — M7989 Other specified soft tissue disorders: Secondary | ICD-10-CM | POA: Insufficient documentation

## 2012-06-09 DIAGNOSIS — N289 Disorder of kidney and ureter, unspecified: Secondary | ICD-10-CM

## 2012-06-09 DIAGNOSIS — F17200 Nicotine dependence, unspecified, uncomplicated: Secondary | ICD-10-CM | POA: Diagnosis not present

## 2012-06-09 DIAGNOSIS — B1920 Unspecified viral hepatitis C without hepatic coma: Secondary | ICD-10-CM | POA: Diagnosis not present

## 2012-06-09 DIAGNOSIS — R600 Localized edema: Secondary | ICD-10-CM

## 2012-06-09 LAB — CBC WITH DIFFERENTIAL/PLATELET
Basophils Relative: 1 % (ref 0–1)
Eosinophils Absolute: 0.5 10*3/uL (ref 0.0–0.7)
Lymphs Abs: 2.5 10*3/uL (ref 0.7–4.0)
MCH: 31 pg (ref 26.0–34.0)
MCHC: 35.6 g/dL (ref 30.0–36.0)
Neutrophils Relative %: 61 % (ref 43–77)
Platelets: 269 10*3/uL (ref 150–400)
RBC: 4.51 MIL/uL (ref 4.22–5.81)

## 2012-06-09 LAB — BASIC METABOLIC PANEL
GFR calc Af Amer: 17 mL/min — ABNORMAL LOW (ref >90)
GFR calc non Af Amer: 15 mL/min — ABNORMAL LOW (ref >90)
Potassium: 3.5 mEq/L (ref 3.5–5.1)
Sodium: 123 mEq/L — ABNORMAL LOW (ref 135–145)

## 2012-06-09 LAB — URINE MICROSCOPIC-ADD ON

## 2012-06-09 LAB — URINALYSIS, ROUTINE W REFLEX MICROSCOPIC
Nitrite: NEGATIVE
Specific Gravity, Urine: <1.005 — ABNORMAL LOW (ref 1.005–1.030)
Urobilinogen, UA: 0.2 mg/dL (ref 0.0–1.0)

## 2012-06-09 MED ORDER — ONDANSETRON HCL 4 MG/2ML IJ SOLN
4.0000 mg | Freq: Once | INTRAMUSCULAR | Status: AC
  Administered 2012-06-09: 4 mg via INTRAVENOUS
  Filled 2012-06-09: qty 2

## 2012-06-09 MED ORDER — OXYCODONE-ACETAMINOPHEN 5-325 MG PO TABS: 6.0000 | ORAL_TABLET | Freq: Once | ORAL | Status: DC

## 2012-06-09 MED ORDER — OXYCODONE-ACETAMINOPHEN 5-325 MG PO TABS: 1.0000 | ORAL_TABLET | Freq: Four times a day (QID) | ORAL | Status: AC | PRN

## 2012-06-09 MED ORDER — HYDROMORPHONE HCL PF 1 MG/ML IJ SOLN: 1.0000 mg | Freq: Once | INTRAMUSCULAR | Status: DC

## 2012-06-09 MED ORDER — HYDROMORPHONE HCL PF 1 MG/ML IJ SOLN
1.0000 mg | Freq: Once | INTRAMUSCULAR | Status: AC
  Administered 2012-06-09: 1 mg via INTRAVENOUS
  Filled 2012-06-09: qty 1

## 2012-06-09 MED ORDER — OXYCODONE HCL 10 MG PO TABS: 10.0000 mg | ORAL_TABLET | Freq: Two times a day (BID) | ORAL | Status: DC | PRN

## 2012-06-09 NOTE — ED Notes (Signed)
CRITICAL VALUE ALERT  Critical value received:  CKMB  Date of notification:  06/10/07  Time of notification:  2055  Critical value read back:YES  Nurse who received alert: Lezlie Octave  MD notified (1st page): COOK  Time of first page:  2055  MD notified (2nd page):  Time of second page:  Responding MD:  Adriana Simas  Time MD responded:  *2055

## 2012-06-09 NOTE — ED Provider Notes (Signed)
History  This chart was scribed for Donnetta Hutching, MD by Erskine Emery. This patient was seen in room APA04/APA04 and the patient's care was started at 18:07.   CSN: [id number]  Arrival date & time 06/09/12  1756   First MD Initiated Contact with Patient 06/09/12 1807      Chief Complaint  Patient presents with  . Leg Swelling    (Consider location/radiation/quality/duration/timing/severity/associated sxs/prior treatment) The history is provided by the patient. No language interpreter was used.  [name] is a 46 y.o. male who presents to the Emergency Department complaining of bilateral lower extremity swelling. Pt was recently admitted here for the same issue but signed himself out yesterday after 11 days in the hospital against the doctor's orders to see his son for his birthday. Pt was admitted for kidney problems and began receiving dialysis (Monday, Wednesday, Friday) while here. Pt reports he is supposed to get a new dialysis catheter placed in his chest tomorrow. Pt originally came in when he noticed decreased urination and brown urine last week; he is still passing little bits of urine. Pt has a h/o myositis and Hepatitis C and denies any h/o IV drug usage.   Patient reports no chest pain or shortness of breath to me   Past Medical History  Diagnosis Date  . Hepatitis C   . Myositis     Left deltoid biopsy at [facility] 10/11/11.  Marland Kitchen Cirrhosis   . Chronic pain syndrome   . 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 Alcohol abuse     Psychiatric admissions for alcohol and drug abuse  . History of cocaine abuse   . Depression     Multiple psychiatric admissions  . Tobacco abuse   . Tattoos   . Low TSH level January 2013    Normal free T4 of 1.06  . Back pain, chronic     Past Surgical History  Procedure Date  . Hernia repair   . Muscle biopsy   . Cardiac catheterization June 2006    Normal coronary arteries  . Appendectomy     No family history on file.  History  Substance Use  Topics  . Smoking status: Current Every Day Smoker -- 1.0 packs/day    Types: Cigarettes  . Smokeless tobacco: Current User  . Alcohol Use: 58.8 oz/week    48 Cans of beer, 50 Shots of liquor per week     Pt drinks heavily      Review of Systems A complete 10 system review of systems was obtained and all systems are negative except as noted in the HPI and PMH.    Allergies  Acetaminophen  Home Medications   Current Outpatient Rx  Name Route Sig Dispense Refill  . IBUPROFEN 200 MG PO TABS Oral Take 400 mg by mouth every 6 (six) hours as needed. Pain    . ADULT MULTIVITAMIN W/MINERALS CH Oral Take 1 tablet by mouth daily.      Triage Vitals: BP 146/90  Pulse 88  Temp 98.6 F (37 C) (Oral)  Resp 20  Ht 5\' 8"  (1.727 m)  Wt 140 lb (63.504 kg)  BMI 21.29 kg/m2  SpO2 98%  Physical Exam  Nursing note and vitals reviewed. Constitutional: He is oriented to person, place, and time. He appears well-developed and well-nourished.  HENT:  Head: Normocephalic and atraumatic.  Eyes: Conjunctivae normal and EOM are normal. Pupils are equal, round, and reactive to light.  Neck: Normal range of motion. Neck supple.  Cardiovascular:  Normal rate, regular rhythm and normal heart sounds.   Pulmonary/Chest: Effort normal and breath sounds normal.  Abdominal: Soft. Bowel sounds are normal.  Musculoskeletal: Normal range of motion. He exhibits edema.       3+ bilateral lower extremity edema.  Neurological: He is alert and oriented to person, place, and time.  Skin: Skin is warm and dry.  Psychiatric: He has a normal mood and affect.    ED Course  Procedures (including critical care time) DIAGNOSTIC STUDIES: Oxygen Saturation is 98% on room air, normal by my interpretation.    COORDINATION OF CARE: 18:50--I evaluated the patient and we discussed a treatment plan including blood work, pain medication, and possible readmission to which the pt agreed.   Results for orders placed during  the hospital encounter of 06/09/12  CBC WITH DIFFERENTIAL      Component Value Range   WBC 11.1 (*) 4.0 - 10.5 K/uL   RBC 4.51  4.22 - 5.81 MIL/uL   Hemoglobin 14.0  13.0 - 17.0 g/dL   HCT 69.6  29.5 - 28.4 %   MCV 87.1  78.0 - 100.0 fL   MCH 31.0  26.0 - 34.0 pg   MCHC 35.6  30.0 - 36.0 g/dL   RDW 13.2  44.0 - 10.2 %   Platelets 269  150 - 400 K/uL   Neutrophils Relative 61  43 - 77 %   Neutro Abs 6.7  1.7 - 7.7 K/uL   Lymphocytes Relative 22  12 - 46 %   Lymphs Abs 2.5  0.7 - 4.0 K/uL   Monocytes Relative 12  3 - 12 %   Monocytes Absolute 1.3 (*) 0.1 - 1.0 K/uL   Eosinophils Relative 4  0 - 5 %   Eosinophils Absolute 0.5  0.0 - 0.7 K/uL   Basophils Relative 1  0 - 1 %   Basophils Absolute 0.1  0.0 - 0.1 K/uL  BASIC METABOLIC PANEL      Component Value Range   Sodium 123 (*) 135 - 145 mEq/L   Potassium 3.5  3.5 - 5.1 mEq/L   Chloride 82 (*) 96 - 112 mEq/L   CO2 26  19 - 32 mEq/L   Glucose, Bld 123 (*) 70 - 99 mg/dL   BUN 29 (*) 6 - 23 mg/dL   Creatinine, Ser 7.25 (*) 0.50 - 1.35 mg/dL   Calcium 9.4  8.4 - 36.6 mg/dL   GFR calc non Af Amer 15 (*) >90 mL/min   GFR calc Af Amer 17 (*) >90 mL/min  URINALYSIS, ROUTINE W REFLEX MICROSCOPIC      Component Value Range   Color, Urine YELLOW  YELLOW   APPearance CLEAR  CLEAR   Specific Gravity, Urine <1.005 (*) 1.005 - 1.030   pH 6.0  5.0 - 8.0   Glucose, UA NEGATIVE  NEGATIVE mg/dL   Hgb urine dipstick SMALL (*) NEGATIVE   Bilirubin Urine NEGATIVE  NEGATIVE   Ketones, ur NEGATIVE  NEGATIVE mg/dL   Protein, ur NEGATIVE  NEGATIVE mg/dL   Urobilinogen, UA 0.2  0.0 - 1.0 mg/dL   Nitrite NEGATIVE  NEGATIVE   Leukocytes, UA NEGATIVE  NEGATIVE  URINE MICROSCOPIC-ADD ON      Component Value Range   Squamous Epithelial / LPF RARE  RARE   WBC, UA 3-6  <3 WBC/hpf   RBC / HPF 3-6  <3 RBC/hpf   Bacteria, UA RARE  RARE        No diagnosis found.  MDM  Discussed clinical scenario with hospitalist on-call and nephrologist  Dr. Kristian Covey.  Neither consultant felt it was necessary to admit patient. He was referred to [facility] Department. Creatinine has stabilized. Elevated CPK-MB does not have clinical significance.   Patient admitted to the hospital recently with rhabdomyolysis. CPK has normalized.  No chest pain/sob     I personally performed the services described in this documentation, which was scribed in my presence. The recorded information has been reviewed and considered.    Donnetta Hutching, MD 06/09/12 [phone]

## 2012-06-09 NOTE — ED Notes (Signed)
Pt signed self out of hospital Friday, here today for bil leg swelling, has been told he has renal failure and needs dialysis, thinks he is scheduled for shunt placement in the am--but unsure. reprots being sob and having mild chest pain.

## 2012-06-09 NOTE — ED Notes (Signed)
Pt c/o bilateral leg swelling and sob for a while. Pt signed himself out of the hospital yesterday AMA.

## 2012-06-10 MED FILL — Oxycodone w/ Acetaminophen Tab 5-325 MG: ORAL | Qty: 6 | Status: AC

## 2012-06-11 ENCOUNTER — Encounter (HOSPITAL_COMMUNITY): Payer: Self-pay

## 2012-06-11 ENCOUNTER — Emergency Department (HOSPITAL_COMMUNITY): Payer: Medicare Other

## 2012-06-11 ENCOUNTER — Emergency Department (HOSPITAL_COMMUNITY)
Admission: EM | Admit: 2012-06-11 | Discharge: 2012-06-11 | Disposition: A | Discharge status: Home / Self Care | Payer: Medicare Other | Attending: Emergency Medicine | Admitting: Emergency Medicine

## 2012-06-11 DIAGNOSIS — S0083XA Contusion of other part of head, initial encounter: Secondary | ICD-10-CM | POA: Diagnosis not present

## 2012-06-11 DIAGNOSIS — Z043 Encounter for examination and observation following other accident: Secondary | ICD-10-CM | POA: Diagnosis not present

## 2012-06-11 DIAGNOSIS — S0003XA Contusion of scalp, initial encounter: Secondary | ICD-10-CM | POA: Insufficient documentation

## 2012-06-11 DIAGNOSIS — H5710 Ocular pain, unspecified eye: Secondary | ICD-10-CM | POA: Insufficient documentation

## 2012-06-11 DIAGNOSIS — W19XXXA Unspecified fall, initial encounter: Secondary | ICD-10-CM | POA: Insufficient documentation

## 2012-06-11 DIAGNOSIS — S0510XA Contusion of eyeball and orbital tissues, unspecified eye, initial encounter: Secondary | ICD-10-CM | POA: Diagnosis not present

## 2012-06-11 DIAGNOSIS — R51 Headache: Secondary | ICD-10-CM | POA: Diagnosis not present

## 2012-06-11 DIAGNOSIS — S1093XA Contusion of unspecified part of neck, initial encounter: Secondary | ICD-10-CM | POA: Diagnosis not present

## 2012-06-11 NOTE — ED Provider Notes (Signed)
History    Scribed for Ryan Hutching, MD, the patient was seen in room APA02/APA02 . This chart was scribed by Lewanda Rife.  CSN: [id number]  Arrival date & time 06/11/12  1631   First MD Initiated Contact with Patient 06/11/12 1707      Chief Complaint  Patient presents with  . Fall    (Consider location/radiation/quality/duration/timing/severity/associated sxs/prior Treatment) Hx was provided by the pt. HPI  [name] is a 46 y.o. male who presents to the Emergency Department complaining of a fall yesterday night after tripping over a curb at the pharmacy. Pt reports he has pain over right forehead and right eye region. Pt states his "legs gave out." Pt denies loss of consciousness, headaches and any other injury. Pt describes the pain as mild. Pt denies taking any pain medicine prior to arrival.     Past Medical History  Diagnosis Date  . Hepatitis C   . Myositis     Left deltoid biopsy at [facility] 10/11/11.  Marland Kitchen Cirrhosis   . Chronic pain syndrome   . 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 Alcohol abuse     Psychiatric admissions for alcohol and drug abuse  . History of cocaine abuse   . Depression     Multiple psychiatric admissions  . Tobacco abuse   . Tattoos   . Low TSH level January 2013    Normal free T4 of 1.06  . Back pain, chronic     Past Surgical History  Procedure Date  . Hernia repair   . Muscle biopsy   . Cardiac catheterization June 2006    Normal coronary arteries  . Appendectomy     No family history on file.  History  Substance Use Topics  . Smoking status: Current Every Day Smoker -- 1.0 packs/day    Types: Cigarettes  . Smokeless tobacco: Current User  . Alcohol Use: 0.0 oz/week      Review of Systems  Constitutional: Negative.   HENT: Negative.  Negative for neck pain.   Respiratory: Negative.   Cardiovascular: Negative.   Gastrointestinal: Negative.   Musculoskeletal: Negative.   Skin: Positive for wound (right forehead abrasion).    Neurological: Negative for syncope and headaches.  Hematological: Negative.   Psychiatric/Behavioral: Negative.     Allergies  Acetaminophen  Home Medications   Current Outpatient Rx  Name Route Sig Dispense Refill  . IBUPROFEN 200 MG PO TABS Oral Take 400 mg by mouth every 6 (six) hours as needed. Pain    . ADULT MULTIVITAMIN W/MINERALS CH Oral Take 1 tablet by mouth daily.    . OXYCODONE HCL 10 MG PO TABS Oral Take 1 tablet (10 mg total) by mouth 2 (two) times daily as needed. 20 tablet 0  . OXYCODONE-ACETAMINOPHEN 5-325 MG PO TABS Oral Take 1-2 tablets by mouth every 6 (six) hours as needed for pain. 6 tablet 0    BP 152/97  Pulse 94  Temp 98.1 F (36.7 C) (Oral)  Resp 20  Ht 5\' 8"  (1.727 m)  Wt 145 lb (65.772 kg)  BMI 22.05 kg/m2  SpO2 100%  Physical Exam  Nursing note and vitals reviewed. Constitutional: He is oriented to person, place, and time. He appears well-developed and well-nourished.  HENT:  Head: Normocephalic.  Right Ear: Hearing and tympanic membrane normal.  Left Ear: Hearing and tympanic membrane normal.  Nose: Nose normal.       Right periorbital contusion 4 by 3 cm triangular abrasion right forehead  Eyes:  Conjunctivae normal and EOM are normal. Pupils are equal, round, and reactive to light.  Neck: Normal range of motion. Neck supple.  Cardiovascular: Normal rate, regular rhythm and normal heart sounds.   Pulmonary/Chest: Effort normal and breath sounds normal.  Abdominal: Soft. Bowel sounds are normal.  Musculoskeletal: Normal range of motion.  Neurological: He is alert and oriented to person, place, and time.  Skin: Skin is warm and dry.       4 by 3 cm triangular abrasion on right forehead  Psychiatric: He has a normal mood and affect.    ED Course  Procedures (including critical care time)  Labs Reviewed - No data to display No results found.  Ct Head Wo Contrast  06/11/2012  *RADIOLOGY REPORT*  Clinical Data:  46 year old male  status post fall with injury around the right eye.  Pain.  CT HEAD WITHOUT CONTRAST CT MAXILLOFACIAL WITHOUT CONTRAST  Technique:  Multidetector CT imaging of the head and maxillofacial structures were performed using the standard protocol without intravenous contrast. Multiplanar CT image reconstructions of the maxillofacial structures were also generated.  Comparison:  Head CT 12/11/2011 and earlier.  CT HEAD  Findings: Face findings are below.  No superior scalp hematoma. Calvarium appears intact.  Mastoids are clear.  Mild Calcified atherosclerosis at the skull base.  Small chronic cortical infarct in the posterior division of the right MCA territory is new since March.  Stable cerebral volume elsewhere. No ventriculomegaly. No midline shift, mass effect, or evidence of mass lesion.  No suspicious intracranial vascular hyperdensity. Mega cisterna magna again noted. No acute intracranial hemorrhage identified.  IMPRESSION: 1.  No acute traumatic injury to the brain. 2.  Chronic right MCA infarct, but new since 12/11/2011. 3.  See face findings below.  CT MAXILLOFACIAL  Findings:   The right periorbital and supraorbital scalp hematoma measuring up to 8 mm in thickness.  Right globe appears intact.  No retrobulbar are soft tissue injury in the right orbit.  Left orbit within normal limits.  Bony orbital walls are intact.  No acute nasal bone fracture. Visualized paranasal sinuses and mastoids are clear.  The mandible intact.  No other acute facial fracture identified.  Visualized deep soft tissue spaces of the face are within normal limits.  IMPRESSION: 1.  Right periorbital soft tissue injury. 2.  No acute facial fracture.   Original Report Authenticated By: Harley Hallmark, M.D.    Ct Maxillofacial Wo Cm  06/11/2012  *RADIOLOGY REPORT*  Clinical Data:  46 year old male status post fall with injury around the right eye.  Pain.  CT HEAD WITHOUT CONTRAST CT MAXILLOFACIAL WITHOUT CONTRAST  Technique:  Multidetector  CT imaging of the head and maxillofacial structures were performed using the standard protocol without intravenous contrast. Multiplanar CT image reconstructions of the maxillofacial structures were also generated.  Comparison:  Head CT 12/11/2011 and earlier.  CT HEAD  Findings: Face findings are below.  No superior scalp hematoma. Calvarium appears intact.  Mastoids are clear.  Mild Calcified atherosclerosis at the skull base.  Small chronic cortical infarct in the posterior division of the right MCA territory is new since March.  Stable cerebral volume elsewhere. No ventriculomegaly. No midline shift, mass effect, or evidence of mass lesion.  No suspicious intracranial vascular hyperdensity. Mega cisterna magna again noted. No acute intracranial hemorrhage identified.  IMPRESSION: 1.  No acute traumatic injury to the brain. 2.  Chronic right MCA infarct, but new since 12/11/2011. 3.  See face findings below.  CT MAXILLOFACIAL  Findings:   The right periorbital and supraorbital scalp hematoma measuring up to 8 mm in thickness.  Right globe appears intact.  No retrobulbar are soft tissue injury in the right orbit.  Left orbit within normal limits.  Bony orbital walls are intact.  No acute nasal bone fracture. Visualized paranasal sinuses and mastoids are clear.  The mandible intact.  No other acute facial fracture identified.  Visualized deep soft tissue spaces of the face are within normal limits.  IMPRESSION: 1.  Right periorbital soft tissue injury. 2.  No acute facial fracture.   Original Report Authenticated By: Harley Hallmark, M.D.        1. Fall   2. Facial contusion       MDM    Status post fall.   CT head and CT maxillofacial negative for fracture. No neuro deficit   I personally performed the services described in this documentation, which was scribed in my presence. The recorded information has been reviewed and considered.    Ryan Hutching, MD 06/27/12 2101

## 2012-06-11 NOTE — ED Notes (Signed)
Pt reports legs gave out at the pharmacy last night and fell.  Pt has bruising, swelling, and abrasion to right eye.

## 2012-06-11 NOTE — ED Provider Notes (Addendum)
History     CSN: [id number]  Arrival date & time 06/11/12  1631   First MD Initiated Contact with Patient 06/11/12 1707      Chief Complaint  Patient presents with  . Fall    (Consider location/radiation/quality/duration/timing/severity/associated sxs/prior treatment) HPI  Past Medical History  Diagnosis Date  . Hepatitis C   . Myositis     Left deltoid biopsy at [facility] 10/11/11.  Marland Kitchen Cirrhosis   . Chronic pain syndrome   . 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 Alcohol abuse     Psychiatric admissions for alcohol and drug abuse  . History of cocaine abuse   . Depression     Multiple psychiatric admissions  . Tobacco abuse   . Tattoos   . Low TSH level January 2013    Normal free T4 of 1.06  . Back pain, chronic     Past Surgical History  Procedure Date  . Hernia repair   . Muscle biopsy   . Cardiac catheterization June 2006    Normal coronary arteries  . Appendectomy     No family history on file.  History  Substance Use Topics  . Smoking status: Current Every Day Smoker -- 1.0 packs/day    Types: Cigarettes  . Smokeless tobacco: Current User  . Alcohol Use: 0.0 oz/week      Review of Systems  Allergies  Acetaminophen  Home Medications   Current Outpatient Rx  Name Route Sig Dispense Refill  . ASPIRIN EC 81 MG PO TBEC Oral Take 81-324 mg by mouth daily as needed. For headache pain    . IBUPROFEN 200 MG PO TABS Oral Take 400 mg by mouth every 6 (six) hours as needed. Pain    . ADULT MULTIVITAMIN W/MINERALS CH Oral Take 1 tablet by mouth daily.    . OXYCODONE HCL 10 MG PO TABS Oral Take 1 tablet (10 mg total) by mouth 2 (two) times daily as needed. 20 tablet 0  . OXYCODONE-ACETAMINOPHEN 5-325 MG PO TABS Oral Take 1-2 tablets by mouth every 6 (six) hours as needed for pain. 6 tablet 0    BP 136/91  Pulse 96  Temp 98.1 F (36.7 C) (Oral)  Resp 18  Ht 5\' 8"  (1.727 m)  Wt 145 lb (65.772 kg)  BMI 22.05 kg/m2  SpO2 100%  Physical Exam  ED Course    Procedures (including critical care time)  Labs Reviewed - No data to display Ct Head Wo Contrast  06/11/2012  *RADIOLOGY REPORT*  Clinical Data:  46 year old male status post fall with injury around the right eye.  Pain.  CT HEAD WITHOUT CONTRAST CT MAXILLOFACIAL WITHOUT CONTRAST  Technique:  Multidetector CT imaging of the head and maxillofacial structures were performed using the standard protocol without intravenous contrast. Multiplanar CT image reconstructions of the maxillofacial structures were also generated.  Comparison:  Head CT 12/11/2011 and earlier.  CT HEAD  Findings: Face findings are below.  No superior scalp hematoma. Calvarium appears intact.  Mastoids are clear.  Mild Calcified atherosclerosis at the skull base.  Small chronic cortical infarct in the posterior division of the right MCA territory is new since March.  Stable cerebral volume elsewhere. No ventriculomegaly. No midline shift, mass effect, or evidence of mass lesion.  No suspicious intracranial vascular hyperdensity. Mega cisterna magna again noted. No acute intracranial hemorrhage identified.  IMPRESSION: 1.  No acute traumatic injury to the brain. 2.  Chronic right MCA infarct, but new since 12/11/2011. 3.  See face  findings below.  CT MAXILLOFACIAL  Findings:   The right periorbital and supraorbital scalp hematoma measuring up to 8 mm in thickness.  Right globe appears intact.  No retrobulbar are soft tissue injury in the right orbit.  Left orbit within normal limits.  Bony orbital walls are intact.  No acute nasal bone fracture. Visualized paranasal sinuses and mastoids are clear.  The mandible intact.  No other acute facial fracture identified.  Visualized deep soft tissue spaces of the face are within normal limits.  IMPRESSION: 1.  Right periorbital soft tissue injury. 2.  No acute facial fracture.   Original Report Authenticated By: Harley Hallmark, M.D.    Ct Maxillofacial Wo Cm  06/11/2012  *RADIOLOGY REPORT*   Clinical Data:  46 year old male status post fall with injury around the right eye.  Pain.  CT HEAD WITHOUT CONTRAST CT MAXILLOFACIAL WITHOUT CONTRAST  Technique:  Multidetector CT imaging of the head and maxillofacial structures were performed using the standard protocol without intravenous contrast. Multiplanar CT image reconstructions of the maxillofacial structures were also generated.  Comparison:  Head CT 12/11/2011 and earlier.  CT HEAD  Findings: Face findings are below.  No superior scalp hematoma. Calvarium appears intact.  Mastoids are clear.  Mild Calcified atherosclerosis at the skull base.  Small chronic cortical infarct in the posterior division of the right MCA territory is new since March.  Stable cerebral volume elsewhere. No ventriculomegaly. No midline shift, mass effect, or evidence of mass lesion.  No suspicious intracranial vascular hyperdensity. Mega cisterna magna again noted. No acute intracranial hemorrhage identified.  IMPRESSION: 1.  No acute traumatic injury to the brain. 2.  Chronic right MCA infarct, but new since 12/11/2011. 3.  See face findings below.  CT MAXILLOFACIAL  Findings:   The right periorbital and supraorbital scalp hematoma measuring up to 8 mm in thickness.  Right globe appears intact.  No retrobulbar are soft tissue injury in the right orbit.  Left orbit within normal limits.  Bony orbital walls are intact.  No acute nasal bone fracture. Visualized paranasal sinuses and mastoids are clear.  The mandible intact.  No other acute facial fracture identified.  Visualized deep soft tissue spaces of the face are within normal limits.  IMPRESSION: 1.  Right periorbital soft tissue injury. 2.  No acute facial fracture.   Original Report Authenticated By: Harley Hallmark, M.D.      1. Fall       MDM  CT of head and maxillofacial area were negative for fracture. He is alert and oriented, moving all extremities. Can discharge home    I personally performed the  services described in this documentation, which was scribed in my presence. The recorded information has been reviewed and considered.       Donnetta Hutching, MD 06/11/12 [id number]  Donnetta Hutching, MD 06/27/12 1191  Donnetta Hutching, MD 06/27/12 2104

## 2012-06-13 DIAGNOSIS — N179 Acute kidney failure, unspecified: Secondary | ICD-10-CM | POA: Diagnosis not present

## 2012-06-21 DIAGNOSIS — IMO0001 Reserved for inherently not codable concepts without codable children: Secondary | ICD-10-CM | POA: Diagnosis not present

## 2012-06-21 DIAGNOSIS — R42 Dizziness and giddiness: Secondary | ICD-10-CM | POA: Diagnosis not present

## 2012-06-21 DIAGNOSIS — N19 Unspecified kidney failure: Secondary | ICD-10-CM | POA: Diagnosis not present

## 2012-07-04 DIAGNOSIS — Z23 Encounter for immunization: Secondary | ICD-10-CM | POA: Diagnosis not present

## 2012-07-04 DIAGNOSIS — N19 Unspecified kidney failure: Secondary | ICD-10-CM | POA: Diagnosis not present

## 2012-09-03 DIAGNOSIS — M549 Dorsalgia, unspecified: Secondary | ICD-10-CM | POA: Diagnosis not present

## 2012-09-03 DIAGNOSIS — N19 Unspecified kidney failure: Secondary | ICD-10-CM | POA: Diagnosis not present

## 2012-09-03 DIAGNOSIS — B182 Chronic viral hepatitis C: Secondary | ICD-10-CM | POA: Diagnosis not present

## 2012-09-04 ENCOUNTER — Ambulatory Visit (HOSPITAL_COMMUNITY)
Admission: RE | Admit: 2012-09-04 | Discharge: 2012-09-04 | Disposition: A | Discharge status: Home / Self Care | Payer: Medicare Other | Source: Ambulatory Visit | Attending: Internal Medicine | Admitting: Internal Medicine

## 2012-09-04 ENCOUNTER — Other Ambulatory Visit (HOSPITAL_COMMUNITY): Payer: Self-pay | Admitting: Internal Medicine

## 2012-09-04 DIAGNOSIS — M549 Dorsalgia, unspecified: Secondary | ICD-10-CM

## 2012-09-06 ENCOUNTER — Encounter (HOSPITAL_COMMUNITY): Payer: Self-pay | Admitting: *Deleted

## 2012-09-06 ENCOUNTER — Inpatient Hospital Stay (HOSPITAL_COMMUNITY)
Admission: EM | Admit: 2012-09-06 | Discharge: 2012-09-10 | Discharge status: Against Medical Advice | Payer: Medicare Other | Source: Home / Self Care | Attending: Internal Medicine | Admitting: Internal Medicine

## 2012-09-06 ENCOUNTER — Observation Stay (HOSPITAL_COMMUNITY): Payer: Medicare Other

## 2012-09-06 ENCOUNTER — Emergency Department (HOSPITAL_COMMUNITY): Payer: Medicare Other

## 2012-09-06 DIAGNOSIS — R109 Unspecified abdominal pain: Secondary | ICD-10-CM | POA: Diagnosis not present

## 2012-09-06 DIAGNOSIS — D72829 Elevated white blood cell count, unspecified: Secondary | ICD-10-CM | POA: Diagnosis present

## 2012-09-06 DIAGNOSIS — R000 Tachycardia, unspecified: Secondary | ICD-10-CM | POA: Diagnosis present

## 2012-09-06 DIAGNOSIS — M3320 Polymyositis, organ involvement unspecified: Principal | ICD-10-CM | POA: Diagnosis present

## 2012-09-06 DIAGNOSIS — E871 Hypo-osmolality and hyponatremia: Secondary | ICD-10-CM | POA: Diagnosis not present

## 2012-09-06 DIAGNOSIS — M6281 Muscle weakness (generalized): Secondary | ICD-10-CM | POA: Diagnosis present

## 2012-09-06 DIAGNOSIS — E860 Dehydration: Secondary | ICD-10-CM | POA: Diagnosis present

## 2012-09-06 DIAGNOSIS — F17200 Nicotine dependence, unspecified, uncomplicated: Secondary | ICD-10-CM

## 2012-09-06 DIAGNOSIS — M6282 Rhabdomyolysis: Secondary | ICD-10-CM

## 2012-09-06 DIAGNOSIS — M609 Myositis, unspecified: Secondary | ICD-10-CM

## 2012-09-06 DIAGNOSIS — B1710 Acute hepatitis C without hepatic coma: Secondary | ICD-10-CM | POA: Diagnosis present

## 2012-09-06 DIAGNOSIS — E875 Hyperkalemia: Secondary | ICD-10-CM | POA: Diagnosis present

## 2012-09-06 DIAGNOSIS — M791 Myalgia, unspecified site: Secondary | ICD-10-CM | POA: Diagnosis present

## 2012-09-06 DIAGNOSIS — G894 Chronic pain syndrome: Secondary | ICD-10-CM | POA: Diagnosis present

## 2012-09-06 DIAGNOSIS — IMO0001 Reserved for inherently not codable concepts without codable children: Secondary | ICD-10-CM | POA: Diagnosis present

## 2012-09-06 DIAGNOSIS — N390 Urinary tract infection, site not specified: Secondary | ICD-10-CM | POA: Diagnosis not present

## 2012-09-06 DIAGNOSIS — R7989 Other specified abnormal findings of blood chemistry: Secondary | ICD-10-CM | POA: Diagnosis present

## 2012-09-06 HISTORY — DX: Acute kidney failure, unspecified: N17.9

## 2012-09-06 LAB — URINALYSIS, ROUTINE W REFLEX MICROSCOPIC
Specific Gravity, Urine: 1.015 (ref 1.005–1.030)
Urobilinogen, UA: 1 mg/dL (ref 0.0–1.0)

## 2012-09-06 LAB — RAPID URINE DRUG SCREEN, HOSP PERFORMED
Amphetamines: NOT DETECTED
Cocaine: NOT DETECTED
Opiates: NOT DETECTED
Tetrahydrocannabinol: NOT DETECTED

## 2012-09-06 LAB — COMPREHENSIVE METABOLIC PANEL
AST: 1425 U/L — ABNORMAL HIGH (ref 0–37)
Albumin: 4.2 g/dL (ref 3.5–5.2)
Calcium: 9 mg/dL (ref 8.4–10.5)
Creatinine, Ser: 1.15 mg/dL (ref 0.50–1.35)
GFR calc non Af Amer: 75 mL/min — ABNORMAL LOW (ref >90)
Total Protein: 8.3 g/dL (ref 6.0–8.3)

## 2012-09-06 LAB — CBC WITH DIFFERENTIAL/PLATELET
Basophils Absolute: 0 10*3/uL (ref 0.0–0.1)
Basophils Relative: 0 % (ref 0–1)
Eosinophils Absolute: 0 10*3/uL (ref 0.0–0.7)
Eosinophils Relative: 0 % (ref 0–5)
HCT: 49.8 % (ref 39.0–52.0)
MCHC: 35.9 g/dL (ref 30.0–36.0)
MCV: 87.1 fL (ref 78.0–100.0)
Monocytes Absolute: 1.8 10*3/uL — ABNORMAL HIGH (ref 0.1–1.0)
RDW: 14.3 % (ref 11.5–15.5)

## 2012-09-06 MED ORDER — ONDANSETRON HCL 4 MG PO TABS: 4.0000 mg | ORAL_TABLET | Freq: Four times a day (QID) | ORAL | Status: DC | PRN

## 2012-09-06 MED ORDER — ALUM & MAG HYDROXIDE-SIMETH 200-200-20 MG/5ML PO SUSP: 30.0000 mL | Freq: Four times a day (QID) | ORAL | Status: DC | PRN

## 2012-09-06 MED ORDER — ALBUTEROL SULFATE (5 MG/ML) 0.5% IN NEBU: 2.5000 mg | INHALATION_SOLUTION | RESPIRATORY_TRACT | Status: DC | PRN

## 2012-09-06 MED ORDER — TRAZODONE HCL 50 MG PO TABS
50.0000 mg | ORAL_TABLET | Freq: Every evening | ORAL | Status: DC | PRN
  Administered 2012-09-07 – 2012-09-09 (×3): 50 mg via ORAL
  Filled 2012-09-06 (×4): qty 1

## 2012-09-06 MED ORDER — SODIUM CHLORIDE 0.9 % IV BOLUS (SEPSIS): 1000.0000 mL | Freq: Once | INTRAVENOUS | Status: DC

## 2012-09-06 MED ORDER — METHYLPREDNISOLONE SODIUM SUCC 125 MG IJ SOLR
80.0000 mg | Freq: Two times a day (BID) | INTRAMUSCULAR | Status: DC
  Administered 2012-09-07 – 2012-09-10 (×7): 80 mg via INTRAVENOUS
  Filled 2012-09-06 (×7): qty 2

## 2012-09-06 MED ORDER — SENNA 8.6 MG PO TABS
1.0000 | ORAL_TABLET | Freq: Every day | ORAL | Status: DC
  Administered 2012-09-07 – 2012-09-10 (×4): 8.6 mg via ORAL
  Filled 2012-09-06 (×6): qty 1

## 2012-09-06 MED ORDER — OXYCODONE HCL 5 MG PO TABS
5.0000 mg | ORAL_TABLET | ORAL | Status: DC | PRN
  Administered 2012-09-07 – 2012-09-10 (×11): 5 mg via ORAL
  Filled 2012-09-06 (×13): qty 1

## 2012-09-06 MED ORDER — DEXTROSE 5 % IV SOLN
1.0000 g | INTRAVENOUS | Status: DC
  Administered 2012-09-07 – 2012-09-09 (×4): 1 g via INTRAVENOUS
  Filled 2012-09-06 (×4): qty 10

## 2012-09-06 MED ORDER — METHYLPREDNISOLONE SODIUM SUCC 125 MG IJ SOLR
80.0000 mg | Freq: Once | INTRAMUSCULAR | Status: AC
  Administered 2012-09-06: 80 mg via INTRAVENOUS
  Filled 2012-09-06: qty 2

## 2012-09-06 MED ORDER — SODIUM CHLORIDE 0.9 % IV BOLUS (SEPSIS): 500.0000 mL | Freq: Once | INTRAVENOUS | Status: DC

## 2012-09-06 MED ORDER — FENTANYL CITRATE 0.05 MG/ML IJ SOLN
50.0000 ug | INTRAMUSCULAR | Status: AC | PRN
  Administered 2012-09-06 (×2): 50 ug via INTRAVENOUS
  Filled 2012-09-06 (×2): qty 2

## 2012-09-06 MED ORDER — HYDROMORPHONE HCL PF 1 MG/ML IJ SOLN
1.0000 mg | INTRAMUSCULAR | Status: DC | PRN
  Administered 2012-09-07 – 2012-09-08 (×8): 1 mg via INTRAVENOUS
  Filled 2012-09-06 (×8): qty 1

## 2012-09-06 MED ORDER — SODIUM BICARBONATE 8.4 % IV SOLN
INTRAVENOUS | Status: DC
  Administered 2012-09-07 – 2012-09-10 (×16): via INTRAVENOUS
  Filled 2012-09-06 (×21): qty 1000

## 2012-09-06 MED ORDER — ONDANSETRON HCL 4 MG/2ML IJ SOLN: 4.0000 mg | Freq: Four times a day (QID) | INTRAMUSCULAR | Status: DC | PRN

## 2012-09-06 MED ORDER — GUAIFENESIN-DM 100-10 MG/5ML PO SYRP: 5.0000 mL | ORAL_SOLUTION | ORAL | Status: DC | PRN

## 2012-09-06 MED ORDER — SODIUM CHLORIDE 0.9 % IV BOLUS (SEPSIS)
1000.0000 mL | Freq: Once | INTRAVENOUS | Status: AC
  Administered 2012-09-06: 1000 mL via INTRAVENOUS

## 2012-09-06 MED ORDER — SODIUM CHLORIDE 0.9 % IV SOLN: INTRAVENOUS | Status: DC

## 2012-09-06 NOTE — H&P (Signed)
[facility] History and Physical  CROCKETT RALLO ZOX:[id number] DOB: Feb 09, 1966 DOA: 09/06/2012  Referring physician: ED physician Dr. Clarene Duke PCP: Avon Gully, MD  Specialists:   Chief Complaint: Generalized muscle aches; flank pain.  HPI: [name] is a 46 y.o. male with a history significant for either myositis or polymyositis, hepatitis C, former alcohol and drug abuser, and depression, who presents to the hospital today with a chief complaint of bilateral flank pain and generalized muscle aches. His symptoms started 2-3 days ago. The pain started primarily in his abdominal sides, and progressed to cramping-like pain in his arms and his legs, particularly in his calf muscles. He rates the pain 10 over 10. He has generalized weakness in his legs. He believes the weakness is from the pain. His wife has had to help him ambulate over the past couple of days. His urine turned from yellow to [place] color early this morning. There may have been bright red blood in his urine as well. He has had "cold sweats" on his legs, but no subjective fever. He reports decreasing his intake of alcohol significantly over the past couple months. He now drinks 2 or 3 beers daily down from 1-2 sixpacks daily. He no longer smokes crack cocaine.  In the emergency department, he tachycardic with a heart rate ranging from 113-122 beats per minute. CT of his abdomen and pelvis reveals no acute abnormality. His chest x-ray reveals no acute cardiopulmonary disease. His lab data are significant for a total CK of greater than 50,000, serum sodium of 123, potassium of 6.2, AST of 1425, ALT of 197, and WBC of 21.3. His urinalysis reveals 40 ketones, greater than 300 protein, positive nitrite, trace of leukocytes, and large hemoglobin. His alcohol level was less than 11. His urine drug screen is negative. He is being admitted for further evaluation and management.  Review of Systems: As above in history present illness.  Otherwise negative.  Past Medical History  Diagnosis Date  . Hepatitis C   . Myositis     Left deltoid biopsy at [facility] 10/11/11.  Marland Kitchen Cirrhosis   . Chronic pain syndrome   . 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 Alcohol abuse     Psychiatric admissions for alcohol and drug abuse  . History of cocaine abuse   . Depression     Multiple psychiatric admissions  . Tobacco abuse   . Tattoos   . Low TSH level January 2013    Normal free T4 of 1.06  . Back pain, chronic   . Acute renal failure     Secondary to rhabdo. Treated with dialysis short-term.   Past Surgical History  Procedure Date  . Hernia repair   . Muscle biopsy   . Cardiac catheterization June 2006    Normal coronary arteries  . Appendectomy    Social History: He is married. However, he and his wife live in separate residences. He lives with his mother in [place]. He has 3 children. He smokes one pack of cigarettes per day. He drinks 2-3 beers per day, significantly less than 2-3 months ago. He denies illicit drug use. He stopped smoking cocaine several months ago. He receives disability.    Allergies  Allergen Reactions  . Acetaminophen Other (See Comments)    Liver disease    Of note, the patient does not have a true allergy to it acetaminophen. He was told to avoid it because of his liver disease.  Family history: His mother is 77 years of age  and has no chronic medical conditions other than poor site. His father died of throat cancer.   Prior to Admission medications   Medication Sig Start Date End Date Taking? Authorizing Provider  aspirin EC 81 MG tablet Take 81-324 mg by mouth daily as needed. For headache pain   Yes Historical Provider, MD  ibuprofen (ADVIL,MOTRIN) 200 MG tablet Take 400 mg by mouth every 6 (six) hours as needed. Pain   Yes Historical Provider, MD  Oxycodone HCl 10 MG TABS Take 1 tablet (10 mg total) by mouth 2 (two) times daily as needed. 06/09/12  Yes Donnetta Hutching, MD   Physical Exam: Filed Vitals:    09/06/12 1712 09/06/12 2236  BP: 172/92 164/92  Pulse: 122 113  Temp: 97.9 F (36.6 C) 98.6 F (37 C)  TempSrc: Oral Oral  Resp: 18 18  Height: 5\' 9"  (1.753 m)   Weight: 60.782 kg (134 lb)   SpO2: 100% 100%     General:  Pleasant alert 46 year old Caucasian man sitting up in bed, in no acute distress.  Eyes: Pupils equal, round, reactive to light. Extraocular was are intact. Conjunctivae are clear. Sclerae are white.  ENT: Oropharynx reveals mildly dry mucous membranes. No posterior exudates or erythema.  Neck: Supple, no adenopathy, no thyromegaly, no JVD.  Cardiovascular: S1, S2, with mild tachycardia.  Respiratory: Clear to auscultation bilaterally.  Abdomen: Positive bowel sounds, soft, nontender, nondistended.  Skin: Multiple tattoos on his torso and bilateral arms. Skin turgor fair to good.  Musculoskeletal: Mild to moderate diffuse tenderness to large muscles on his torso, arms, and legs. No acute hot original is. Pedal pulses palpable. No pedal edema.  Psychiatric: Pleasant affect. He is alert and oriented x3. Cooperative.  Neurologic: Cranial nerves II through XII are intact. He is able to raise each leg against gravity approximately 30. Handgrip bilaterally is 5 over 5. Sensation is grossly intact. Gait not assessed.  Labs on Admission:  Basic Metabolic Panel:  Lab 09/06/12 4540  NA 123*  K 6.2*  CL 84*  CO2 22  GLUCOSE 119*  BUN 17  CREATININE 1.15  CALCIUM 9.0  MG --  PHOS --   Liver Function Tests:  Lab 09/06/12 2035  AST 1425*  ALT 197*  ALKPHOS 56  BILITOT 0.8  PROT 8.3  ALBUMIN 4.2   No results found for this basename: LIPASE:5,AMYLASE:5 in the last 168 hours No results found for this basename: AMMONIA:5 in the last 168 hours CBC:  Lab 09/06/12 2035  WBC 21.3*  NEUTROABS 17.8*  HGB 17.9*  HCT 49.8  MCV 87.1  PLT 290   Cardiac Enzymes:  Lab 09/06/12 2035  CKTOTAL >50000*  CKMB --  CKMBINDEX --  TROPONINI --    BNP  (last 3 results) No results found for this basename: PROBNP:3 in the last 8760 hours CBG: No results found for this basename: GLUCAP:5 in the last 168 hours  Radiological Exams on Admission: Ct Abdomen Pelvis Wo Contrast  09/06/2012  *RADIOLOGY REPORT*  Clinical Data: Right flank pain for 1 month.  Chronic pain.  CT ABDOMEN AND PELVIS WITHOUT CONTRAST  Technique:  Multidetector CT imaging of the abdomen and pelvis was performed following the standard protocol without intravenous contrast.  Comparison: None.  Findings: Lung Bases: Mild dependent atelectasis.  Liver:  Grossly normal. Unenhanced CT was performed per clinician order.  Lack of IV contrast limits sensitivity and specificity, especially for evaluation of abdominal/pelvic solid viscera.  Spleen:  Normal.  Gallbladder:  Normal.  Common bile duct:  Normal.  Pancreas:  Normal.  Adrenal glands:  Normal.  Kidneys:  No renal calculi.  Ureters appear normal.  Stomach:  Grossly normal.  Small bowel:  Normal.  Colon:   Normal.  Pelvic Genitourinary:  Normal.  Bones:  No aggressive osseous lesions. L5-S1 predominant lumbar spondylosis.  Vasculature: Atherosclerosis.  No acute vascular abnormality for noncontrast study.  IMPRESSION: No acute abnormality.   Original Report Authenticated By: Andreas Newport, M.D.    Dg Chest Port 1 View  09/06/2012  *RADIOLOGY REPORT*  Clinical Data: Leukocytosis  PORTABLE CHEST - 1 VIEW  Comparison: 12/10/2011  Findings: Lungs are clear. No pleural effusion or pneumothorax. The cardiomediastinal contours are within normal limits. The visualized bones and soft tissues are without significant appreciable abnormality.  IMPRESSION: No radiographic evidence of acute cardiopulmonary process.   Original Report Authenticated By: Jearld Lesch, M.D.     EKG: Not ordered.  Assessment/Plan Principal Problem:  *Generalized muscle ache Active Problems:  Polymyositis  Elevated LFTs  Hyponatremia  Leukocytosis  HEPATITIS  C  MYOSITIS  Tachycardia  Dehydration  Chronic pain syndrome  Hyperkalemia  Muscle weakness (generalized)   1. This is a 46 year old man with a known history of either polymyositis or myositis, chronic pain syndrome, and hepatitis C, who presents with multiple aches secondary to exacerbation of myositis. He presented very similarly in September of 2013. However, this time, he is not in acute renal failure. His total CK is impressive. In September, his count was also greater than 50,000. His hyponatremia is secondary to hypovolemia and dehydration. His elevated hemoglobin is also reflection of dehydration. His liver transaminases are elevated primarily from myositis and secondarily from hepatitis C. His hyperkalemia is secondary to diffuse muscle breakdown. His tachycardia is from volume depletion/dehydration. The etiology of his leukocytosis may be a manifestation of myositis although underlying urinary tract infection is considered. He did present with bilateral flank pain, but the CT scan of his abdomen and pelvis is unremarkable. His urinalysis is positive for nitrite, and therefore, he'll be treated for possible urinary tract infection.     Plan: 1. The patient was given at least a liter and a half of normal saline and 80 mg of Solu-Medrol in the emergency department. 2. We'll continue IV fluid hydration with half-normal saline with bicarbonate added which will help decrease his serum potassium. He would need aggressive IV fluid hydration over the next several days. Would discontinue the bicarbonate in his IV fluids in 24 hours or less. 3. Start Rocephin empirically. 4. His pain will be treated with as needed oxycodone or as needed IV hydromorphone. 5. We'll continue IV steroids and taper accordingly. 6. Tobacco cessation counseling. Nicotine replacement therapy was offered but the patient refused. 7. Follow his total CK, renal function, serum potassium, LFTs, etc. closely. 8. Physical  therapy consultation and evaluation. 9. Further management will be deferred to his attending physician in the morning. Consider outpatient referral to rheumatology.    Code Status: Full code. Family Communication: No family available. Disposition Plan: Discharge to home when clinically improved.  Time spent: One hour  [facility] Pager [phone]  If 7PM-7AM, please contact night-coverage [email or web address] Password [facility] 09/06/2012, 11:37 PM

## 2012-09-06 NOTE — ED Provider Notes (Signed)
History     CSN: [id number]  Arrival date & time 09/06/12  1658   First MD Initiated Contact with Patient 09/06/12 1935      Chief Complaint  Patient presents with  . Pain  . Flank Pain     HPI Pt was seen at 2015.   Per pt, c/o gradual onset and worsening of persistent generalized muscles "cramping" pain for the past 1+ months, worse over the past 2 days.  States he has also developed right flank pain and hematuria for the past 2 days.  Denies testicular pain/swelling, no dysuria, no abd pain, no CP/SOB, no fevers, no N/V/D, no rash, no fevers, no injury.    States no PMD Past Medical History  Diagnosis Date  . Hepatitis C   . Myositis     Left deltoid biopsy at [facility] 10/11/11.  Marland Kitchen Cirrhosis   . Chronic pain syndrome   . 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 Alcohol abuse     Psychiatric admissions for alcohol and drug abuse  . History of cocaine abuse   . Depression     Multiple psychiatric admissions  . Tobacco abuse   . Tattoos   . Low TSH level January 2013    Normal free T4 of 1.06  . Back pain, chronic     Past Surgical History  Procedure Date  . Hernia repair   . Muscle biopsy   . Cardiac catheterization June 2006    Normal coronary arteries  . Appendectomy     History  Substance Use Topics  . Smoking status: Current Every Day Smoker -- 1.0 packs/day    Types: Cigarettes  . Smokeless tobacco: Current User  . Alcohol Use: 0.0 oz/week      Review of Systems ROS: Statement: All systems negative except as marked or noted in the HPI; Constitutional: Negative for fever and chills. ; ; Eyes: Negative for eye pain, redness and discharge. ; ; ENMT: Negative for ear pain, hoarseness, nasal congestion, sinus pressure and sore throat. ; ; Cardiovascular: Negative for chest pain, palpitations, diaphoresis, dyspnea and peripheral edema. ; ; Respiratory: Negative for cough, wheezing and stridor. ; ; Gastrointestinal: Negative for nausea, vomiting, diarrhea, abdominal pain,  blood in stool, hematemesis, jaundice and rectal bleeding. . ; ; Genitourinary: Negative for dysuria. +flank pain and hematuria. ; ; Musculoskeletal: +diffuse muscle pain. Negative for back pain and neck pain. Negative for swelling and trauma.; ; Skin: Negative for pruritus, rash, abrasions, blisters, bruising and skin lesion.; ; Neuro: Negative for headache, lightheadedness and neck stiffness. Negative for weakness, altered level of consciousness , altered mental status, extremity weakness, paresthesias, involuntary movement, seizure and syncope.       Allergies  Acetaminophen  Home Medications   Current Outpatient Rx  Name  Route  Sig  Dispense  Refill  . ASPIRIN EC 81 MG PO TBEC   Oral   Take 81-324 mg by mouth daily as needed. For headache pain         . IBUPROFEN 200 MG PO TABS   Oral   Take 400 mg by mouth every 6 (six) hours as needed. Pain         . OXYCODONE HCL 10 MG PO TABS   Oral   Take 1 tablet (10 mg total) by mouth 2 (two) times daily as needed.   20 tablet   0     BP 172/92  Pulse 122  Temp 97.9 F (36.6 C) (Oral)  Resp 18  Ht  5\' 9"  (1.753 m)  Wt 134 lb (60.782 kg)  BMI 19.79 kg/m2  SpO2 100%  Physical Exam 2020: Physical examination:  Nursing notes reviewed; Vital signs and O2 SAT reviewed;  Constitutional: Well developed, Well nourished, Well hydrated, In no acute distress; Head:  Normocephalic, atraumatic; Eyes: EOMI, PERRL, No scleral icterus; ENMT: Mouth and pharynx normal, Mucous membranes moist; Neck: Supple, Full range of motion, No lymphadenopathy; Cardiovascular: Regular rate and rhythm, No murmur, rub, or gallop; Respiratory: Breath sounds clear & equal bilaterally, No rales, rhonchi, wheezes.  Speaking full sentences with ease, Normal respiratory effort/excursion; Chest: Nontender, Movement normal; Abdomen: Soft, Nontender, Nondistended, Normal bowel sounds; Genitourinary: No CVA tenderness; Extremities: Pulses normal, Bilat UE's and LE's  muscles compartments soft.  No tenderness. No deformity. No edema, No calf edema or asymmetry.; Neuro: AA&Ox3, Major CN grossly intact.  Speech clear. No gross focal motor or sensory deficits in extremities.; Skin: Color normal, Warm, Dry.   ED Course  Procedures     MDM  MDM Reviewed: previous chart, nursing note and vitals Reviewed previous: labs Interpretation: labs and CT scan Consults: admitting MD    Results for orders placed during the hospital encounter of 09/06/12  CBC WITH DIFFERENTIAL      Component Value Range   WBC 21.3 (*) 4.0 - 10.5 K/uL   RBC 5.72  4.22 - 5.81 MIL/uL   Hemoglobin 17.9 (*) 13.0 - 17.0 g/dL   HCT 16.1  09.6 - 04.5 %   MCV 87.1  78.0 - 100.0 fL   MCH 31.3  26.0 - 34.0 pg   MCHC 35.9  30.0 - 36.0 g/dL   RDW 40.9  81.1 - 91.4 %   Platelets 290  150 - 400 K/uL   Neutrophils Relative 84 (*) 43 - 77 %   Neutro Abs 17.8 (*) 1.7 - 7.7 K/uL   Lymphocytes Relative 8 (*) 12 - 46 %   Lymphs Abs 1.7  0.7 - 4.0 K/uL   Monocytes Relative 9  3 - 12 %   Monocytes Absolute 1.8 (*) 0.1 - 1.0 K/uL   Eosinophils Relative 0  0 - 5 %   Eosinophils Absolute 0.0  0.0 - 0.7 K/uL   Basophils Relative 0  0 - 1 %   Basophils Absolute 0.0  0.0 - 0.1 K/uL  COMPREHENSIVE METABOLIC PANEL      Component Value Range   Sodium 123 (*) 135 - 145 mEq/L   Potassium 6.2 (*) 3.5 - 5.1 mEq/L   Chloride 84 (*) 96 - 112 mEq/L   CO2 22  19 - 32 mEq/L   Glucose, Bld 119 (*) 70 - 99 mg/dL   BUN 17  6 - 23 mg/dL   Creatinine, Ser 7.82  0.50 - 1.35 mg/dL   Calcium 9.0  8.4 - 95.6 mg/dL   Total Protein 8.3  6.0 - 8.3 g/dL   Albumin 4.2  3.5 - 5.2 g/dL   AST 2130 (*) 0 - 37 U/L   ALT 197 (*) 0 - 53 U/L   Alkaline Phosphatase 56  39 - 117 U/L   Total Bilirubin 0.8  0.3 - 1.2 mg/dL   GFR calc non Af Amer 75 (*) >90 mL/min   GFR calc Af Amer 87 (*) >90 mL/min  CK      Component Value Range   Total CK >50000 (*) 7 - 232 U/L  ETHANOL      Component Value Range   Alcohol, Ethyl (B)  <11  0 - 11 mg/dL   Ct Abdomen Pelvis Wo Contrast 09/06/2012  *RADIOLOGY REPORT*  Clinical Data: Right flank pain for 1 month.  Chronic pain.  CT ABDOMEN AND PELVIS WITHOUT CONTRAST  Technique:  Multidetector CT imaging of the abdomen and pelvis was performed following the standard protocol without intravenous contrast.  Comparison: None.  Findings: Lung Bases: Mild dependent atelectasis.  Liver:  Grossly normal. Unenhanced CT was performed per clinician order.  Lack of IV contrast limits sensitivity and specificity, especially for evaluation of abdominal/pelvic solid viscera.  Spleen:  Normal.  Gallbladder:  Normal.  Common bile duct:  Normal.  Pancreas:  Normal.  Adrenal glands:  Normal.  Kidneys:  No renal calculi.  Ureters appear normal.  Stomach:  Grossly normal.  Small bowel:  Normal.  Colon:   Normal.  Pelvic Genitourinary:  Normal.  Bones:  No aggressive osseous lesions. L5-S1 predominant lumbar spondylosis.  Vasculature: Atherosclerosis.  No acute vascular abnormality for noncontrast study.  IMPRESSION: No acute abnormality.   Original Report Authenticated By: Andreas Newport, M.D.    Results for IRBIN, FINES (MRN [id number]) as of 09/06/2012 22:38  Ref. Range 06/04/2012 07:50 06/05/2012 06:00 06/06/2012 05:37 06/07/2012 05:35 06/08/2012 05:37 09/06/2012 20:35  AST Latest Range: 0-37 U/L 119 (H) 68 (H) 50 (H) 41 (H) 36 1425 (H)  ALT Latest Range: 0-53 U/L 183 (H) 124 (H) 100 (H) 80 (H) 64 (H) 197 (H)     2225:  CK total significantly elevated; will continue aggressive IVF.  LFT's elevated from previous.  States he has hx hepatitis C.  Denies daily etoh use, but EPIC chart review shows pt has hx of etoh and cocaine abuse.  No signs of etoh withdrawal currently.  States to me he does not have a regular PMD. Dx and testing d/w pt.  Questions answered.  Verb understanding, agreeable to admit.  T/C to [facility] Dr. [name], case discussed, including:  HPI, pertinent PM/SHx, VS/PE, dx testing, ED course and  treatment:  Agreeable to admit, requests to obtain tele bed to team 2.      Laray Anger, DO 09/09/12 0116

## 2012-09-06 NOTE — ED Notes (Signed)
Pt states right flank pain and "muscles feel like they are knotting up all over body"  Symptoms began x ~13mo ago to back and muscular pain x 2 days.

## 2012-09-06 NOTE — ED Notes (Signed)
RN at bedside

## 2012-09-07 ENCOUNTER — Encounter (HOSPITAL_COMMUNITY): Payer: Self-pay

## 2012-09-07 DIAGNOSIS — R7989 Other specified abnormal findings of blood chemistry: Secondary | ICD-10-CM | POA: Diagnosis not present

## 2012-09-07 DIAGNOSIS — N390 Urinary tract infection, site not specified: Secondary | ICD-10-CM

## 2012-09-07 DIAGNOSIS — IMO0001 Reserved for inherently not codable concepts without codable children: Secondary | ICD-10-CM

## 2012-09-07 DIAGNOSIS — F17200 Nicotine dependence, unspecified, uncomplicated: Secondary | ICD-10-CM | POA: Diagnosis not present

## 2012-09-07 DIAGNOSIS — N19 Unspecified kidney failure: Secondary | ICD-10-CM | POA: Diagnosis not present

## 2012-09-07 DIAGNOSIS — M6282 Rhabdomyolysis: Secondary | ICD-10-CM | POA: Diagnosis not present

## 2012-09-07 DIAGNOSIS — M3320 Polymyositis, organ involvement unspecified: Principal | ICD-10-CM

## 2012-09-07 LAB — CBC
Hemoglobin: 15.4 g/dL (ref 13.0–17.0)
MCV: 84.4 fL (ref 78.0–100.0)
Platelets: 218 10*3/uL (ref 150–400)
RBC: 5.12 MIL/uL (ref 4.22–5.81)
WBC: 15 10*3/uL — ABNORMAL HIGH (ref 4.0–10.5)

## 2012-09-07 LAB — BASIC METABOLIC PANEL
CO2: 26 mEq/L (ref 19–32)
Chloride: 91 mEq/L — ABNORMAL LOW (ref 96–112)
Creatinine, Ser: 1.81 mg/dL — ABNORMAL HIGH (ref 0.50–1.35)
GFR calc Af Amer: 50 mL/min — ABNORMAL LOW (ref >90)
GFR calc non Af Amer: 44 mL/min — ABNORMAL LOW (ref >90)
Glucose, Bld: 234 mg/dL — ABNORMAL HIGH (ref 70–99)
Potassium: 4.3 mEq/L (ref 3.5–5.1)
Sodium: 128 mEq/L — ABNORMAL LOW (ref 135–145)

## 2012-09-07 LAB — COMPREHENSIVE METABOLIC PANEL WITH GFR
ALT: 271 U/L — ABNORMAL HIGH (ref 0–53)
AST: 1931 U/L — ABNORMAL HIGH (ref 0–37)
Albumin: 3.2 g/dL — ABNORMAL LOW (ref 3.5–5.2)
Alkaline Phosphatase: 45 U/L (ref 39–117)
BUN: 22 mg/dL (ref 6–23)
CO2: 23 meq/L (ref 19–32)
Calcium: 7.6 mg/dL — ABNORMAL LOW (ref 8.4–10.5)
Chloride: 94 meq/L — ABNORMAL LOW (ref 96–112)
Creatinine, Ser: 1.5 mg/dL — ABNORMAL HIGH (ref 0.50–1.35)
GFR calc Af Amer: 63 mL/min — ABNORMAL LOW
GFR calc non Af Amer: 55 mL/min — ABNORMAL LOW
Glucose, Bld: 201 mg/dL — ABNORMAL HIGH (ref 70–99)
Potassium: 5.9 meq/L — ABNORMAL HIGH (ref 3.5–5.1)
Sodium: 126 meq/L — ABNORMAL LOW (ref 135–145)
Total Bilirubin: 0.3 mg/dL (ref 0.3–1.2)
Total Protein: 6.7 g/dL (ref 6.0–8.3)

## 2012-09-07 LAB — CK: Total CK: >50000 U/L — ABNORMAL HIGH (ref 7–232)

## 2012-09-07 LAB — SEDIMENTATION RATE: Sed Rate: 0 mm/h (ref 0–16)

## 2012-09-07 MED ORDER — SODIUM BICARBONATE 8.4 % IV SOLN
INTRAVENOUS | Status: AC
  Filled 2012-09-07: qty 50

## 2012-09-07 MED ORDER — SODIUM POLYSTYRENE SULFONATE 15 GM/60ML PO SUSP
15.0000 g | Freq: Once | ORAL | Status: AC
  Administered 2012-09-07: 15 g via ORAL
  Filled 2012-09-07: qty 60

## 2012-09-07 MED ORDER — CLONIDINE HCL 0.1 MG PO TABS
0.1000 mg | ORAL_TABLET | Freq: Two times a day (BID) | ORAL | Status: DC
  Administered 2012-09-07 – 2012-09-10 (×8): 0.1 mg via ORAL
  Filled 2012-09-07 (×8): qty 1

## 2012-09-07 MED ORDER — FUROSEMIDE 10 MG/ML IJ SOLN
20.0000 mg | Freq: Once | INTRAMUSCULAR | Status: AC
  Administered 2012-09-07: 20 mg via INTRAVENOUS
  Filled 2012-09-07: qty 2

## 2012-09-07 MED ORDER — SODIUM POLYSTYRENE SULFONATE 15 GM/60ML PO SUSP: 60.0000 g | Freq: Once | ORAL | Status: DC

## 2012-09-07 MED ORDER — HYDRALAZINE HCL 20 MG/ML IJ SOLN: 5.0000 mg | INTRAMUSCULAR | Status: DC | PRN

## 2012-09-07 NOTE — Discharge Summary (Signed)
Subjective: Patient was admitted last night due to muscle pain secondary to rhabdomyolysis. He is doing better today Objective: Vital signs in last 24 hours: Temp:  [97.8 F (36.6 C)-98.6 F (37 C)] 97.8 F (36.6 C) (12/21 0540) Pulse Rate:  [95-122] 95  (12/21 0540) Resp:  [18-20] 20  (12/21 0540) BP: (149-181)/(92-116) 149/99 mmHg (12/21 0540) SpO2:  [95 %-100 %] 98 % (12/21 0540) Weight:  [134 lb (60.782 kg)-141 lb 1.5 oz (64 kg)] 141 lb 1.5 oz (64 kg) (12/21 0216) Weight change:  Last BM Date: 09/04/12  Intake/Output from previous day: 12/20 0701 - 12/21 0700 In: -  Out: 1150 [Urine:1150]  PHYSICAL EXAM General appearance: alert and no distress Resp: clear to auscultation bilaterally Cardio: S1, S2 normal GI: soft, non-tender; bowel sounds normal; no masses,  no organomegaly Extremities: extremities normal, atraumatic, no cyanosis or edema  Lab Results:    @labtest @ ABGS No results found for this basename: PHART,PCO2,PO2ART,TCO2,HCO3 in the last 72 hours CULTURES No results found for this or any previous visit (from the past 240 hour(s)). Studies/Results: Ct Abdomen Pelvis Wo Contrast  09/06/2012  *RADIOLOGY REPORT*  Clinical Data: Right flank pain for 1 month.  Chronic pain.  CT ABDOMEN AND PELVIS WITHOUT CONTRAST  Technique:  Multidetector CT imaging of the abdomen and pelvis was performed following the standard protocol without intravenous contrast.  Comparison: None.  Findings: Lung Bases: Mild dependent atelectasis.  Liver:  Grossly normal. Unenhanced CT was performed per clinician order.  Lack of IV contrast limits sensitivity and specificity, especially for evaluation of abdominal/pelvic solid viscera.  Spleen:  Normal.  Gallbladder:  Normal.  Common bile duct:  Normal.  Pancreas:  Normal.  Adrenal glands:  Normal.  Kidneys:  No renal calculi.  Ureters appear normal.  Stomach:  Grossly normal.  Small bowel:  Normal.  Colon:   Normal.  Pelvic Genitourinary:  Normal.   Bones:  No aggressive osseous lesions. L5-S1 predominant lumbar spondylosis.  Vasculature: Atherosclerosis.  No acute vascular abnormality for noncontrast study.  IMPRESSION: No acute abnormality.   Original Report Authenticated By: Andreas Newport, M.D.    Dg Chest Port 1 View  09/06/2012  *RADIOLOGY REPORT*  Clinical Data: Leukocytosis  PORTABLE CHEST - 1 VIEW  Comparison: 12/10/2011  Findings: Lungs are clear. No pleural effusion or pneumothorax. The cardiomediastinal contours are within normal limits. The visualized bones and soft tissues are without significant appreciable abnormality.  IMPRESSION: No radiographic evidence of acute cardiopulmonary process.   Original Report Authenticated By: Jearld Lesch, M.D.     Medications: I have reviewed the patient's current medications.  Assesment: Principal Problem:  *Generalized muscle ache Active Problems:  HEPATITIS C  MYOSITIS  Tachycardia  Dehydration  Elevated LFTs  Chronic pain syndrome  Hyperkalemia  Hyponatremia  Polymyositis  Leukocytosis  Muscle weakness (generalized)  UTI (urinary tract infection)    Plan: Continue Iv hydration Will treat hyperkalemia We will do repeat BMP.    LOS: 1 day   [name] 09/07/2012, 10:14 AM

## 2012-09-07 NOTE — ED Notes (Signed)
Patient would like some pain medicine at this time. RN made aware.

## 2012-09-07 NOTE — Progress Notes (Signed)
Addendum:  The patient's blood pressure increased overnight. Therefore, clonidine and when necessary hydralazine were started. His serum potassium is still elevated at 5.9 this morning. In addition to bicarbonate in the IV fluids, I will order one dose of IV Lasix and one dose of Kayexalate. A followup basic metabolic panel is ordered for 1:61 PM today.  I have asked the nursing staff to notify Dr. Felecia Shelling of the patient's admission.

## 2012-09-07 NOTE — Progress Notes (Signed)
Dr. Felecia Shelling in to see patient, new order for Kayexalate d/t K 5.9. Patient had a dose of Kayexalate this AM. Notified Dr. Ouida Sills who is on call for Dr. Felecia Shelling. Explained potassium level and that patient received Kayexalate this AM. Dr. Ouida Sills ordered not to give Kayexalate. BMP ordered for this afternoon.

## 2012-09-08 DIAGNOSIS — N19 Unspecified kidney failure: Secondary | ICD-10-CM | POA: Diagnosis not present

## 2012-09-08 DIAGNOSIS — IMO0001 Reserved for inherently not codable concepts without codable children: Secondary | ICD-10-CM | POA: Diagnosis not present

## 2012-09-08 DIAGNOSIS — M6282 Rhabdomyolysis: Secondary | ICD-10-CM | POA: Diagnosis not present

## 2012-09-08 LAB — BASIC METABOLIC PANEL
BUN: 37 mg/dL — ABNORMAL HIGH (ref 6–23)
Chloride: 92 mEq/L — ABNORMAL LOW (ref 96–112)
GFR calc Af Amer: 49 mL/min — ABNORMAL LOW (ref >90)
GFR calc non Af Amer: 42 mL/min — ABNORMAL LOW (ref >90)
Potassium: 3.8 mEq/L (ref 3.5–5.1)
Sodium: 129 mEq/L — ABNORMAL LOW (ref 135–145)

## 2012-09-08 LAB — URINE CULTURE

## 2012-09-08 LAB — CK: Total CK: 117053 U/L — ABNORMAL HIGH (ref 7–232)

## 2012-09-08 MED ORDER — HYDROMORPHONE HCL PF 1 MG/ML IJ SOLN
2.0000 mg | INTRAMUSCULAR | Status: DC | PRN
  Administered 2012-09-08 – 2012-09-10 (×9): 2 mg via INTRAVENOUS
  Filled 2012-09-08 (×9): qty 2

## 2012-09-08 MED ORDER — HYDROMORPHONE HCL PF 1 MG/ML IJ SOLN: 1.0000 mg | INTRAMUSCULAR | Status: DC | PRN

## 2012-09-08 MED ORDER — LORAZEPAM 2 MG/ML IJ SOLN
0.5000 mg | INTRAMUSCULAR | Status: DC | PRN
  Administered 2012-09-08 – 2012-09-09 (×3): 0.5 mg via INTRAVENOUS
  Filled 2012-09-08 (×3): qty 1

## 2012-09-08 NOTE — Plan of Care (Signed)
Problem: Phase I Progression Outcomes Goal: Pain controlled with appropriate interventions Outcome: Progressing Pain medication increased this morning.  Pt verbalizes increased relief.

## 2012-09-08 NOTE — Plan of Care (Signed)
Problem: Phase I Progression Outcomes Goal: Initial discharge plan identified Outcome: Completed/Met Date Met:  09/08/12 Return home at discharge.

## 2012-09-08 NOTE — Progress Notes (Signed)
Subjective: I am seeing this patient for Dr. Felecia Shelling. He is admitted with rhabdomyolysis and apparently has a history of myositis although at the level of the details of that. He has developed some acute renal failure I believe probably on the basis of his rhabdomyolysis. He was hyperkalemic yesterday which was treated with Kayexalate and it's better. He says he has diffuse pain which is not being controlled with his current treatments  Objective: Vital signs in last 24 hours: Temp:  [97.3 F (36.3 C)-97.5 F (36.4 C)] 97.3 F (36.3 C) (12/22 0627) Pulse Rate:  [81-85] 85  (12/22 0627) Resp:  [18-20] 20  (12/22 0627) BP: (111-142)/(75-87) 142/87 mmHg (12/22 0627) SpO2:  [96 %-98 %] 97 % (12/22 0627) Weight:  [67.8 kg (149 lb 7.6 oz)] 67.8 kg (149 lb 7.6 oz) (12/22 0627) Weight change: 7.018 kg (15 lb 7.6 oz) Last BM Date: 09/07/12  Intake/Output from previous day: 12/21 0701 - 12/22 0700 In: 3895.8 [P.O.:360; I.V.:3535.8] Out: 2800 [Urine:2800]  PHYSICAL EXAM General appearance: alert, cooperative and mild distress Resp: clear to auscultation bilaterally Cardio: regular rate and rhythm, S1, S2 normal, no murmur, click, rub or gallop GI: soft, non-tender; bowel sounds normal; no masses,  no organomegaly Extremities: extremities normal, atraumatic, no cyanosis or edema  Lab Results:    Basic Metabolic Panel:  Basename 09/08/12 0540 09/07/12 1353  NA 129* 128*  K 3.8 4.3  CL 92* 91*  CO2 28 26  GLUCOSE 193* 234*  BUN 37* 30*  CREATININE 1.86* 1.81*  CALCIUM 7.5* 7.3*  MG -- --  PHOS -- --   Liver Function Tests:  Basename 09/07/12 0507 09/06/12 2035  AST 1931* 1425*  ALT 271* 197*  ALKPHOS 45 56  BILITOT 0.3 0.8  PROT 6.7 8.3  ALBUMIN 3.2* 4.2   No results found for this basename: LIPASE:2,AMYLASE:2 in the last 72 hours No results found for this basename: AMMONIA:2 in the last 72 hours CBC:  Basename 09/07/12 0507 09/06/12 2035  WBC 15.0* 21.3*  NEUTROABS --  17.8*  HGB 15.4 17.9*  HCT 43.2 49.8  MCV 84.4 87.1  PLT 218 290   Cardiac Enzymes:  Basename 09/08/12 0540 09/07/12 0507 09/06/12 2035  CKTOTAL 213086* >50000* >50000*  CKMB -- -- --  CKMBINDEX -- -- --  TROPONINI -- -- --   BNP: No results found for this basename: PROBNP:3 in the last 72 hours D-Dimer: No results found for this basename: DDIMER:2 in the last 72 hours CBG: No results found for this basename: GLUCAP:6 in the last 72 hours Hemoglobin A1C: No results found for this basename: HGBA1C in the last 72 hours Fasting Lipid Panel: No results found for this basename: CHOL,HDL,LDLCALC,TRIG,CHOLHDL,LDLDIRECT in the last 72 hours Thyroid Function Tests: No results found for this basename: TSH,T4TOTAL,FREET4,T3FREE,THYROIDAB in the last 72 hours Anemia Panel: No results found for this basename: VITAMINB12,FOLATE,FERRITIN,TIBC,IRON,RETICCTPCT in the last 72 hours Coagulation: No results found for this basename: LABPROT:2,INR:2 in the last 72 hours Urine Drug Screen: Drugs of Abuse     Component Value Date/Time   LABOPIA NONE DETECTED 09/06/2012 2258   COCAINSCRNUR NONE DETECTED 09/06/2012 2258   LABBENZ NONE DETECTED 09/06/2012 2258   AMPHETMU NONE DETECTED 09/06/2012 2258   THCU NONE DETECTED 09/06/2012 2258   LABBARB NONE DETECTED 09/06/2012 2258    Alcohol Level:  Basename 09/06/12 2035  ETH <11   Urinalysis:  Basename 09/06/12 2228  COLORURINE BROWN*  LABSPEC 1.015  PHURINE 6.5  GLUCOSEU NEGATIVE  HGBUR LARGE*  BILIRUBINUR SMALL*  KETONESUR 40*  PROTEINUR >300*  UROBILINOGEN 1.0  NITRITE POSITIVE*  LEUKOCYTESUR TRACE*   Misc. Labs:  [facility] No results found for this basename: PHART,PCO2,PO2ART,TCO2,HCO3 in the last 72 hours CULTURES No results found for this or any previous visit (from the past 240 hour(s)). Studies/Results: Ct Abdomen Pelvis Wo Contrast  09/06/2012  *RADIOLOGY REPORT*  Clinical Data: Right flank pain for 1 month.  Chronic pain.   CT ABDOMEN AND PELVIS WITHOUT CONTRAST  Technique:  Multidetector CT imaging of the abdomen and pelvis was performed following the standard protocol without intravenous contrast.  Comparison: None.  Findings: Lung Bases: Mild dependent atelectasis.  Liver:  Grossly normal. Unenhanced CT was performed per clinician order.  Lack of IV contrast limits sensitivity and specificity, especially for evaluation of abdominal/pelvic solid viscera.  Spleen:  Normal.  Gallbladder:  Normal.  Common bile duct:  Normal.  Pancreas:  Normal.  Adrenal glands:  Normal.  Kidneys:  No renal calculi.  Ureters appear normal.  Stomach:  Grossly normal.  Small bowel:  Normal.  Colon:   Normal.  Pelvic Genitourinary:  Normal.  Bones:  No aggressive osseous lesions. L5-S1 predominant lumbar spondylosis.  Vasculature: Atherosclerosis.  No acute vascular abnormality for noncontrast study.  IMPRESSION: No acute abnormality.   Original Report Authenticated By: Andreas Newport, M.D.    Dg Chest Port 1 View  09/06/2012  *RADIOLOGY REPORT*  Clinical Data: Leukocytosis  PORTABLE CHEST - 1 VIEW  Comparison: 12/10/2011  Findings: Lungs are clear. No pleural effusion or pneumothorax. The cardiomediastinal contours are within normal limits. The visualized bones and soft tissues are without significant appreciable abnormality.  IMPRESSION: No radiographic evidence of acute cardiopulmonary process.   Original Report Authenticated By: Jearld Lesch, M.D.     Medications:  Scheduled:   . cefTRIAXone (ROCEPHIN)  IV  1 g Intravenous Q24H  . cloNIDine  0.1 mg Oral BID  . methylPREDNISolone (SOLU-MEDROL) injection  80 mg Intravenous Q12H  . senna  1 tablet Oral Daily  . sodium polystyrene  60 g Oral Once   Continuous:   . sodium chloride 0.45 % 1,000 mL with sodium bicarbonate 50 mEq infusion 200 mL/hr at 09/08/12 0602   RUE:[id number], alum & mag hydroxide-simeth, guaiFENesin-dextromethorphan, hydrALAZINE, HYDROmorphone (DILAUDID)  injection, ondansetron (ZOFRAN) IV, ondansetron, oxyCODONE, traZODone  Assesment: He has what appears to be rhabdomyolysis probably an exacerbation of myositis. His LFTs are elevated also. He has some renal dysfunction. He says his pain medication is not totally effective. He says he wants to leave the floor but I don't think that's a good idea Principal Problem:  *Generalized muscle ache Active Problems:  HEPATITIS C  MYOSITIS  Tachycardia  Dehydration  Elevated LFTs  Chronic pain syndrome  Hyperkalemia  Hyponatremia  Polymyositis  Leukocytosis  Muscle weakness (generalized)  UTI (urinary tract infection)    Plan: Continue IV fluids I increased his pain medication he will continue other medicines and have repeat laboratory work in the morning.    LOS: 2 days   Lyndall Windt L 09/08/2012, 10:13 AM

## 2012-09-09 DIAGNOSIS — M6282 Rhabdomyolysis: Secondary | ICD-10-CM | POA: Diagnosis not present

## 2012-09-09 DIAGNOSIS — IMO0001 Reserved for inherently not codable concepts without codable children: Secondary | ICD-10-CM | POA: Diagnosis not present

## 2012-09-09 DIAGNOSIS — N19 Unspecified kidney failure: Secondary | ICD-10-CM | POA: Diagnosis not present

## 2012-09-09 LAB — CK: Total CK: >50000 U/L — ABNORMAL HIGH (ref 7–232)

## 2012-09-09 LAB — BASIC METABOLIC PANEL
BUN: 39 mg/dL — ABNORMAL HIGH (ref 6–23)
Chloride: 99 mEq/L (ref 96–112)
GFR calc Af Amer: 49 mL/min — ABNORMAL LOW (ref >90)
GFR calc non Af Amer: 42 mL/min — ABNORMAL LOW (ref >90)
Potassium: 4 mEq/L (ref 3.5–5.1)
Sodium: 138 mEq/L (ref 135–145)

## 2012-09-09 MED ORDER — BIOTENE DRY MOUTH MT LIQD
15.0000 mL | Freq: Two times a day (BID) | OROMUCOSAL | Status: DC
  Administered 2012-09-09 – 2012-09-10 (×3): 15 mL via OROMUCOSAL

## 2012-09-09 NOTE — Progress Notes (Signed)
Patient is yelling for a cigarette demanding that he going outside to smoke.  Patient bed alarm is on due to his unsteady gait and increased confusion.  Patient was offered a nicotine patch but has refused.  PRN Anxiety medication given to help calm the patient.

## 2012-09-09 NOTE — Progress Notes (Signed)
Patient is more calm and alert from earlier episodes.  Patient displayed increased confusion during shift report which patient stated "I feel different since my pain med."  Patient PRN pain medication of Dilaudid was increased in amount and frequency recently.  Patient wanted to go home AMA and check on his mother.  His blood pressure and anxiety were increased at the time.  Continually reoriented patient and had wife talk to him as well.  MD on-call was notified and PRN anxiety given to assist him.

## 2012-09-09 NOTE — Discharge Summary (Signed)
Subjective: Patient feels better. He has less muscle pain and spasm. His CPK is still over 50,000 and his renal function is abnormal. His hyperkalemia corrected. Objective: Vital signs in last 24 hours: Temp:  [97.6 F (36.4 C)-98.7 F (37.1 C)] 97.8 F (36.6 C) (12/23 0715) Pulse Rate:  [89-95] 93  (12/23 0715) Resp:  [16-20] 20  (12/23 0715) BP: (136-172)/(82-113) 168/113 mmHg (12/23 0715) SpO2:  [87 %-96 %] 93 % (12/23 0715) Weight change:  Last BM Date: 09/07/12  Intake/Output from previous day: 12/22 0701 - 12/23 0700 In: 8460 [P.O.:1200; I.V.:7110; IV Piggyback:150] Out: 3350 [Urine:3350]  PHYSICAL EXAM General appearance: alert and no distress Resp: clear to auscultation bilaterally Cardio: S1, S2 normal GI: soft, non-tender; bowel sounds normal; no masses,  no organomegaly Extremities: extremities normal, atraumatic, no cyanosis or edema  Lab Results:    @labtest @ [facility] No results found for this basename: PHART,PCO2,PO2ART,TCO2,HCO3 in the last 72 hours CULTURES Recent Results (from the past 240 hour(s))  URINE CULTURE     Status: Normal   Collection Time   09/06/12 10:28 PM      Component Value Range Status Comment   Specimen Description URINE, CLEAN CATCH   Final    Special Requests NONE   Final    Culture  Setup Time 09/07/2012 23:24   Final    Colony Count 3,000 COLONIES/ML   Final    Culture INSIGNIFICANT GROWTH   Final    Report Status 09/08/2012 FINAL   Final    Studies/Results: No results found.  Medications: I have reviewed the patient's current medications.  Assesment: Principal Problem:  *Generalized muscle ache Active Problems:  HEPATITIS C  MYOSITIS  Tachycardia  Dehydration  Elevated LFTs  Chronic pain syndrome  Hyperkalemia  Hyponatremia  Polymyositis  Leukocytosis  Muscle weakness (generalized)  UTI (urinary tract infection)    Plan: Continue Iv hydration We will do repeat BMP and cpk Continue regular treatment.    LOS: 3 days   Sharaine Delange 09/09/2012, 7:54 AM

## 2012-09-10 DIAGNOSIS — N19 Unspecified kidney failure: Secondary | ICD-10-CM | POA: Diagnosis not present

## 2012-09-10 DIAGNOSIS — M6282 Rhabdomyolysis: Secondary | ICD-10-CM | POA: Diagnosis not present

## 2012-09-10 DIAGNOSIS — IMO0001 Reserved for inherently not codable concepts without codable children: Secondary | ICD-10-CM | POA: Diagnosis not present

## 2012-09-10 LAB — BASIC METABOLIC PANEL
BUN: 36 mg/dL — ABNORMAL HIGH (ref 6–23)
GFR calc Af Amer: 53 mL/min — ABNORMAL LOW (ref >90)
GFR calc non Af Amer: 46 mL/min — ABNORMAL LOW (ref >90)
Potassium: 3.5 mEq/L (ref 3.5–5.1)

## 2012-09-10 LAB — CK: Total CK: >20000 U/L — ABNORMAL HIGH (ref 7–232)

## 2012-09-10 NOTE — Progress Notes (Signed)
UR Chart Review Completed  

## 2012-09-10 NOTE — Progress Notes (Addendum)
Patient left AMA. Encouraged patient to stay and continue treatment. Explained to patient that if he were to come back he would have to go through the emergency room and start treatment over. Explained to patient that we, as a facility, cannot be held responsible for anything if he leaves against Dr. Letitia Neri advice. Patient understood but stated that he had to go see his family. Explained that he could have visitors but patient was persistent stating he would be back later after seeing his family. IV removed. Patient wheeled out of facility by wife. Dr. Felecia Shelling notified.

## 2012-09-10 NOTE — Discharge Summary (Signed)
Subjective: Patient is improving. His urine his clearing. His CPK is still above 20,000.  Objective: Vital signs in last 24 hours: Temp:  [97.2 F (36.2 C)-98 F (36.7 C)] 98 F (36.7 C) (12/24 1610) Pulse Rate:  [89-99] 99  (12/24 0608) Resp:  [18-20] 20  (12/24 9604) BP: (161-181)/(18-108) 175/108 mmHg (12/24 0608) SpO2:  [92 %-96 %] 94 % (12/24 5409) Weight change:  Last BM Date: 09/07/12  Intake/Output from previous day: 12/23 0701 - 12/24 0700 In: 480 [P.O.:480] Out: 2150 [Urine:2150]  PHYSICAL EXAM General appearance: alert and no distress Resp: clear to auscultation bilaterally Cardio: S1, S2 normal GI: soft, non-tender; bowel sounds normal; no masses,  no organomegaly Extremities: extremities normal, atraumatic, no cyanosis or edema  Lab Results:    @labtest @ [facility] No results found for this basename: PHART,PCO2,PO2ART,TCO2,HCO3 in the last 72 hours CULTURES Recent Results (from the past 240 hour(s))  URINE CULTURE     Status: Normal   Collection Time   09/06/12 10:28 PM      Component Value Range Status Comment   Specimen Description URINE, CLEAN CATCH   Final    Special Requests NONE   Final    Culture  Setup Time 09/07/2012 23:24   Final    Colony Count 3,000 COLONIES/ML   Final    Culture INSIGNIFICANT GROWTH   Final    Report Status 09/08/2012 FINAL   Final    Studies/Results: No results found.  Medications: I have reviewed the patient's current medications.  Assesment: Principal Problem:  *Generalized muscle ache Active Problems:  HEPATITIS C  MYOSITIS  Tachycardia  Dehydration  Elevated LFTs  Chronic pain syndrome  Hyperkalemia  Hyponatremia  Polymyositis  Leukocytosis  Muscle weakness (generalized)  UTI (urinary tract infection)    Plan: Continue Iv hydration We will do repeat BMP and cpk Continue regular treatment.    LOS: 4 days   [name] 09/10/2012, 7:52 AM

## 2012-09-11 ENCOUNTER — Encounter (HOSPITAL_COMMUNITY): Payer: Self-pay | Admitting: Emergency Medicine

## 2012-09-11 ENCOUNTER — Inpatient Hospital Stay (HOSPITAL_COMMUNITY)
Admission: EM | Admit: 2012-09-11 | Discharge: 2012-09-15 | DRG: 546 | Disposition: A | Discharge status: Home / Self Care | Payer: Medicare Other | Attending: Internal Medicine | Admitting: Internal Medicine

## 2012-09-11 DIAGNOSIS — Z79899 Other long term (current) drug therapy: Secondary | ICD-10-CM

## 2012-09-11 DIAGNOSIS — B1920 Unspecified viral hepatitis C without hepatic coma: Secondary | ICD-10-CM | POA: Diagnosis present

## 2012-09-11 DIAGNOSIS — E871 Hypo-osmolality and hyponatremia: Secondary | ICD-10-CM | POA: Diagnosis present

## 2012-09-11 DIAGNOSIS — Z7982 Long term (current) use of aspirin: Secondary | ICD-10-CM

## 2012-09-11 DIAGNOSIS — F329 Major depressive disorder, single episode, unspecified: Secondary | ICD-10-CM | POA: Diagnosis present

## 2012-09-11 DIAGNOSIS — R000 Tachycardia, unspecified: Secondary | ICD-10-CM | POA: Diagnosis present

## 2012-09-11 DIAGNOSIS — R404 Transient alteration of awareness: Secondary | ICD-10-CM | POA: Diagnosis not present

## 2012-09-11 DIAGNOSIS — F3289 Other specified depressive episodes: Secondary | ICD-10-CM | POA: Diagnosis present

## 2012-09-11 DIAGNOSIS — F1410 Cocaine abuse, uncomplicated: Secondary | ICD-10-CM | POA: Diagnosis present

## 2012-09-11 DIAGNOSIS — R7989 Other specified abnormal findings of blood chemistry: Secondary | ICD-10-CM | POA: Diagnosis present

## 2012-09-11 DIAGNOSIS — G894 Chronic pain syndrome: Secondary | ICD-10-CM | POA: Diagnosis present

## 2012-09-11 DIAGNOSIS — F17200 Nicotine dependence, unspecified, uncomplicated: Secondary | ICD-10-CM | POA: Diagnosis present

## 2012-09-11 DIAGNOSIS — E875 Hyperkalemia: Secondary | ICD-10-CM | POA: Diagnosis present

## 2012-09-11 DIAGNOSIS — R52 Pain, unspecified: Secondary | ICD-10-CM | POA: Diagnosis not present

## 2012-09-11 DIAGNOSIS — M3320 Polymyositis, organ involvement unspecified: Principal | ICD-10-CM | POA: Diagnosis present

## 2012-09-11 DIAGNOSIS — M6282 Rhabdomyolysis: Secondary | ICD-10-CM | POA: Diagnosis present

## 2012-09-11 DIAGNOSIS — N179 Acute kidney failure, unspecified: Secondary | ICD-10-CM | POA: Diagnosis present

## 2012-09-11 DIAGNOSIS — F1010 Alcohol abuse, uncomplicated: Secondary | ICD-10-CM | POA: Diagnosis present

## 2012-09-11 DIAGNOSIS — D72829 Elevated white blood cell count, unspecified: Secondary | ICD-10-CM | POA: Diagnosis present

## 2012-09-11 DIAGNOSIS — K7460 Unspecified cirrhosis of liver: Secondary | ICD-10-CM | POA: Diagnosis present

## 2012-09-11 DIAGNOSIS — R51 Headache: Secondary | ICD-10-CM | POA: Diagnosis present

## 2012-09-11 LAB — BASIC METABOLIC PANEL
BUN: 28 mg/dL — ABNORMAL HIGH (ref 6–23)
Chloride: 92 mEq/L — ABNORMAL LOW (ref 96–112)
GFR calc Af Amer: 54 mL/min — ABNORMAL LOW (ref >90)
Potassium: 3 mEq/L — ABNORMAL LOW (ref 3.5–5.1)

## 2012-09-11 LAB — URINALYSIS, ROUTINE W REFLEX MICROSCOPIC
Leukocytes, UA: NEGATIVE
Nitrite: NEGATIVE
Specific Gravity, Urine: 1.01 (ref 1.005–1.030)
pH: 6.5 (ref 5.0–8.0)

## 2012-09-11 LAB — CBC WITH DIFFERENTIAL/PLATELET
Basophils Absolute: 0 10*3/uL (ref 0.0–0.1)
HCT: 42.2 % (ref 39.0–52.0)
Lymphocytes Relative: 19 % (ref 12–46)
Monocytes Relative: 8 % (ref 3–12)
Neutro Abs: 10.5 10*3/uL — ABNORMAL HIGH (ref 1.7–7.7)
RDW: 14 % (ref 11.5–15.5)
WBC: 14.4 10*3/uL — ABNORMAL HIGH (ref 4.0–10.5)

## 2012-09-11 LAB — URINE MICROSCOPIC-ADD ON

## 2012-09-11 MED ORDER — SODIUM CHLORIDE 0.9 % IV BOLUS (SEPSIS)
1000.0000 mL | Freq: Once | INTRAVENOUS | Status: AC
  Administered 2012-09-11: 1000 mL via INTRAVENOUS

## 2012-09-11 MED ORDER — FENTANYL CITRATE 0.05 MG/ML IJ SOLN: 100.0000 ug | INTRAMUSCULAR | Status: DC | PRN

## 2012-09-11 MED ORDER — SODIUM CHLORIDE 0.9 % IV SOLN
INTRAVENOUS | Status: DC
  Administered 2012-09-12 (×3): via INTRAVENOUS
  Administered 2012-09-13: 75 mL/h via INTRAVENOUS
  Administered 2012-09-14 – 2012-09-15 (×2): via INTRAVENOUS

## 2012-09-11 MED ORDER — POTASSIUM CHLORIDE 10 MEQ/100ML IV SOLN
10.0000 meq | INTRAVENOUS | Status: AC
  Administered 2012-09-12 (×3): 10 meq via INTRAVENOUS
  Filled 2012-09-11 (×3): qty 100

## 2012-09-11 MED ORDER — ENOXAPARIN SODIUM 40 MG/0.4ML ~~LOC~~ SOLN
40.0000 mg | Freq: Every day | SUBCUTANEOUS | Status: DC
  Administered 2012-09-11 – 2012-09-14 (×4): 40 mg via SUBCUTANEOUS
  Filled 2012-09-11 (×4): qty 0.4

## 2012-09-11 MED ORDER — SODIUM CHLORIDE 0.9 % IV SOLN
INTRAVENOUS | Status: AC
  Administered 2012-09-11: 1000 mL via INTRAVENOUS

## 2012-09-11 MED ORDER — ONDANSETRON HCL 4 MG/2ML IJ SOLN
4.0000 mg | Freq: Three times a day (TID) | INTRAMUSCULAR | Status: AC | PRN
  Administered 2012-09-11: 4 mg via INTRAVENOUS
  Filled 2012-09-11: qty 2

## 2012-09-11 MED ORDER — ASPIRIN EC 81 MG PO TBEC
81.0000 mg | DELAYED_RELEASE_TABLET | Freq: Every day | ORAL | Status: DC | PRN
  Administered 2012-09-13: 324 mg via ORAL
  Filled 2012-09-11 (×2): qty 4

## 2012-09-11 MED ORDER — HYDROMORPHONE HCL PF 1 MG/ML IJ SOLN
1.0000 mg | INTRAMUSCULAR | Status: AC | PRN
  Administered 2012-09-11 – 2012-09-12 (×2): 1 mg via INTRAVENOUS
  Filled 2012-09-11 (×2): qty 1

## 2012-09-11 NOTE — ED Notes (Signed)
Patient states he was admitted to the hospital a couple of days ago and checked himself out "because I wanted to watch my kids open their Christmas presents." Now complaining of generalized pain.

## 2012-09-11 NOTE — ED Provider Notes (Signed)
History     CSN: [id number]  Arrival date & time 09/11/12  2114   First MD Initiated Contact with Patient 09/11/12 2152      Chief Complaint  Patient presents with  . Pain     HPI Pt was seen at 2150.   Per pt, c/o gradual onset and persistence of constant generalized muscles "pain" that continues after he signed out AMA yesterday from his hospital for same, dx rhabdomyolysis.  States he returns to the ED today for re-admission.  Denies injury, no fevers, no CP/SOB, no abd pain, no N/V/D.    Past Medical History  Diagnosis Date  . Hepatitis C   . Myositis     Left deltoid biopsy at [facility] 10/11/11.  Marland Kitchen Cirrhosis   . Chronic pain syndrome   . 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 Alcohol abuse     Psychiatric admissions for alcohol and drug abuse  . History of cocaine abuse   . Depression     Multiple psychiatric admissions  . Tobacco abuse   . Tattoos   . Low TSH level January 2013    Normal free T4 of 1.06  . Back pain, chronic   . Acute renal failure     Secondary to rhabdo. Treated with dialysis short-term.    Past Surgical History  Procedure Date  . Hernia repair   . Muscle biopsy   . Cardiac catheterization June 2006    Normal coronary arteries  . Appendectomy      History  Substance Use Topics  . Smoking status: Current Every Day Smoker -- 1.0 packs/day    Types: Cigarettes  . Smokeless tobacco: Current User  . Alcohol Use: 0.0 oz/week      Review of Systems ROS: Statement: All systems negative except as marked or noted in the HPI; Constitutional: Negative for fever and chills. ; ; Eyes: Negative for eye pain, redness and discharge. ; ; ENMT: Negative for ear pain, hoarseness, nasal congestion, sinus pressure and sore throat. ; ; Cardiovascular: Negative for chest pain, palpitations, diaphoresis, dyspnea and peripheral edema. ; ; Respiratory: Negative for cough, wheezing and stridor. ; ; Gastrointestinal: Negative for nausea, vomiting, diarrhea, abdominal pain,  blood in stool, hematemesis, jaundice and rectal bleeding. . ; ; Genitourinary: Negative for dysuria, flank pain and hematuria. ; ; Musculoskeletal: +generalized muscles pain. Negative for back pain and neck pain. Negative for swelling and trauma.; ; Skin: Negative for pruritus, rash, abrasions, blisters, bruising and skin lesion.; ; Neuro: Negative for headache, lightheadedness and neck stiffness. Negative for weakness, altered level of consciousness , altered mental status, extremity weakness, paresthesias, involuntary movement, seizure and syncope.       Allergies  Acetaminophen  Home Medications   Current Outpatient Rx  Name  Route  Sig  Dispense  Refill  . ASPIRIN EC 81 MG PO TBEC   Oral   Take 81-324 mg by mouth daily as needed. For headache pain         . IBUPROFEN 200 MG PO TABS   Oral   Take 400 mg by mouth every 6 (six) hours as needed. Pain         . OXYCODONE HCL 10 MG PO TABS   Oral   Take 1 tablet (10 mg total) by mouth 2 (two) times daily as needed.   20 tablet   0   . RANITIDINE HCL 150 MG PO TABS   Oral   Take 150 mg by mouth 2 (two) times daily  as needed. FOR ACID REFLUX/GERD/INDIGESTION           BP 144/93  Pulse 108  Temp 98.4 F (36.9 C) (Oral)  Resp 20  Ht 5\' 9"  (1.753 m)  Wt 134 lb (60.782 kg)  BMI 19.79 kg/m2  SpO2 97%  Physical Exam 2155; Physical examination:  Nursing notes reviewed; Vital signs and O2 SAT reviewed;  Constitutional: Well developed, Well nourished, Well hydrated, In no acute distress; Head:  Normocephalic, atraumatic; Eyes: EOMI, PERRL, No scleral icterus; ENMT: Mouth and pharynx normal, Mucous membranes moist; Neck: Supple, Full range of motion, No lymphadenopathy; Cardiovascular: Regular rate and rhythm, No murmur, rub, or gallop; Respiratory: Breath sounds clear & equal bilaterally, No rales, rhonchi, wheezes.  Speaking full sentences with ease, Normal respiratory effort/excursion; Chest: Nontender, Movement normal;  Abdomen: Soft, Nontender, Nondistended, Normal bowel sounds;; Extremities: Pulses normal, No tenderness, No edema, No calf edema or asymmetry.; Neuro: AA&Ox3, Major CN grossly intact.  Speech clear. Climbs on and off the stretcher by himself. Gait steady. No gross focal motor or sensory deficits in extremities.; Skin: Color normal, Warm, Dry.   ED Course  Procedures    MDM  MDM Reviewed: previous chart, nursing note and vitals Reviewed previous: labs     2210:  Pt left AMA from inpatient for same complaint today.  CK >20000, BUN 36/Cr 1.74   Needs re-admit.  T/C to Dr. Renard Matter (on call for Dr. Felecia Shelling), case discussed, including:  HPI, pertinent PM/SHx, VS/PE, dx testing, ED course and treatment:  Agreeable to admit, requests to write temporary orders, obtain tele bed.             Laray Anger, DO 09/13/12 1842

## 2012-09-12 ENCOUNTER — Encounter (HOSPITAL_COMMUNITY): Payer: Self-pay | Admitting: General Practice

## 2012-09-12 DIAGNOSIS — E876 Hypokalemia: Secondary | ICD-10-CM | POA: Diagnosis not present

## 2012-09-12 DIAGNOSIS — M6282 Rhabdomyolysis: Secondary | ICD-10-CM | POA: Diagnosis not present

## 2012-09-12 DIAGNOSIS — N19 Unspecified kidney failure: Secondary | ICD-10-CM | POA: Diagnosis not present

## 2012-09-12 LAB — BASIC METABOLIC PANEL
CO2: 33 mEq/L — ABNORMAL HIGH (ref 19–32)
Calcium: 8.5 mg/dL (ref 8.4–10.5)
Chloride: 98 mEq/L (ref 96–112)
Creatinine, Ser: 1.64 mg/dL — ABNORMAL HIGH (ref 0.50–1.35)
Glucose, Bld: 93 mg/dL (ref 70–99)
Sodium: 140 mEq/L (ref 135–145)

## 2012-09-12 LAB — RAPID URINE DRUG SCREEN, HOSP PERFORMED
Amphetamines: NOT DETECTED
Barbiturates: NOT DETECTED
Benzodiazepines: NOT DETECTED

## 2012-09-12 MED ORDER — OXYCODONE HCL 5 MG PO TABS
5.0000 mg | ORAL_TABLET | ORAL | Status: DC | PRN
  Administered 2012-09-12: 10 mg via ORAL
  Administered 2012-09-12: 5 mg via ORAL
  Administered 2012-09-12 – 2012-09-14 (×11): 10 mg via ORAL
  Filled 2012-09-12 (×11): qty 2
  Filled 2012-09-12: qty 1
  Filled 2012-09-12 (×2): qty 2

## 2012-09-12 MED ORDER — OXYCODONE-ACETAMINOPHEN 5-325 MG PO TABS: 1.0000 | ORAL_TABLET | ORAL | Status: DC | PRN

## 2012-09-12 NOTE — Progress Notes (Signed)
Subjective: Patient feels better. He is being rehydrated. The muscle pain is improving.  Objective: Vital signs in last 24 hours: Temp:  [97.4 F (36.3 C)-98.9 F (37.2 C)] 97.4 F (36.3 C) (12/26 0542) Pulse Rate:  [84-108] 84  (12/26 0542) Resp:  [19-20] 20  (12/26 0542) BP: (144-155)/(87-93) 150/87 mmHg (12/26 0542) SpO2:  [93 %-97 %] 94 % (12/26 0542) Weight:  [134 lb (60.782 kg)-141 lb 3.2 oz (64.048 kg)] 141 lb 3.2 oz (64.048 kg) (12/26 0157) Weight change:  Last BM Date: 09/10/12  Intake/Output from previous day: 12/25 0701 - 12/26 0700 In: 1022.5 [P.O.:240; I.V.:782.5] Out: 1000 [Urine:1000]  PHYSICAL EXAM General appearance: alert and no distress Resp: clear to auscultation bilaterally Cardio: S1, S2 normal GI: soft, non-tender; bowel sounds normal; no masses,  no organomegaly Extremities: mild muscle spasm and tenderness diffusely  Lab Results:    @labtest @ [facility] No results found for this basename: PHART,PCO2,PO2ART,TCO2,HCO3 in the last 72 hours CULTURES Recent Results (from the past 240 hour(s))  URINE CULTURE     Status: Normal   Collection Time   09/06/12 10:28 PM      Component Value Range Status Comment   Specimen Description URINE, CLEAN CATCH   Final    Special Requests NONE   Final    Culture  Setup Time 09/07/2012 23:24   Final    Colony Count 3,000 COLONIES/ML   Final    Culture INSIGNIFICANT GROWTH   Final    Report Status 09/08/2012 FINAL   Final    Studies/Results: No results found.  Medications: I have reviewed the patient's current medications.  Assesment:  Generalized muscle ache  Active Problems:  HEPATITIS C  MYOSITIS  Tachycardia  Dehydration  Elevated LFTs  Chronic pain syndrome  Hyperkalemia  Hyponatremia  Polymyositis  Leukocytosis  Muscle weakness (generalized   Plan: Continue Iv fluids We will monitor CPK and BMP Current treatment    LOS: 1 day   [name] 09/12/2012, 2:52 PM

## 2012-09-12 NOTE — Progress Notes (Signed)
Patient also refusing rounds.

## 2012-09-12 NOTE — Progress Notes (Signed)
UR Chart Review Completed  

## 2012-09-12 NOTE — Care Management Note (Unsigned)
    Page 1 of 1   09/12/2012     3:12:34 PM   CARE MANAGEMENT NOTE 09/12/2012  Patient:  [name], [name]   Account Number:  [id number]  Date Initiated:  09/12/2012  Documentation initiated by:  Rosemary Holms  Subjective/Objective Assessment:   Pt previously admitted with Rabdomylosis. Pt left AMA on 09/10/12: "had to see his children open presents" Came back but now Dr. Felecia Shelling plans to DC from is practice after this admission.     Action/Plan:   Anticipated DC Date:  09/15/2012   Anticipated DC Plan:  HOME/SELF CARE      DC Planning Services  CM consult      Choice offered to / List presented to:             Status of service:  In process, will continue to follow Medicare Important Message given?   (If response is "NO", the following Medicare IM given date fields will be blank) Date Medicare IM given:   Date Additional Medicare IM given:    Discharge Disposition:    Per UR Regulation:    If discussed at Long Length of Stay Meetings, dates discussed:    Comments:  09/12/12 Rosemary Holms RN BSN CM

## 2012-09-12 NOTE — H&P (Signed)
[name] MRN: [id number] DOB/AGE: 24-Apr-1966 46 y.o. Primary Care Physician:Everline Mahaffy, MD Admit date: 09/11/2012 Chief Complaint:  Muscle pain HPI:  This is a 46 years old male who was recently admitted due rhabdomyolysis and signed AMA about 24 hours ago came to ER with the same complaint. His CPK is high and also has renal failure. Patient revaluated and admitted for further treatment. No fever, chills, cough, nausea, vomiting, abdominal pain, dysuria, urgency or frequency of urination.  Past Medical History  Diagnosis Date  . Hepatitis C   . Myositis     Left deltoid biopsy at [facility] 10/11/11.  Marland Kitchen Cirrhosis   . Chronic pain syndrome   . 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 Alcohol abuse     Psychiatric admissions for alcohol and drug abuse  . History of cocaine abuse   . Depression     Multiple psychiatric admissions  . Tobacco abuse   . Tattoos   . Low TSH level January 2013    Normal free T4 of 1.06  . Back pain, chronic   . Acute renal failure     Secondary to rhabdo. Treated with dialysis short-term.   Past Surgical History  Procedure Date  . Hernia repair   . Muscle biopsy   . Cardiac catheterization June 2006    Normal coronary arteries  . Appendectomy         History reviewed. No pertinent family history.  Social History:  reports that he has been smoking Cigarettes.  He has a 40 pack-year smoking history. He uses smokeless tobacco. He reports that he drinks alcohol. He reports that he uses illicit drugs (Cocaine and Marijuana).   Allergies:  Allergies  Allergen Reactions  . Acetaminophen Other (See Comments)    Liver disease     Medications Prior to Admission  Medication Sig Dispense Refill  . aspirin EC 81 MG tablet Take 81-324 mg by mouth daily as needed. For headache pain      . ibuprofen (ADVIL,MOTRIN) 200 MG tablet Take 400 mg by mouth every 6 (six) hours as needed. Pain      . Oxycodone HCl 10 MG TABS Take 1 tablet (10 mg total) by mouth 2 (two)  times daily as needed.  20 tablet  0  . ranitidine (ZANTAC) 150 MG tablet Take 150 mg by mouth 2 (two) times daily as needed. FOR ACID REFLUX/GERD/INDIGESTION           WUJ:WJXBJ from the symptoms mentioned above,there are no other symptoms referable to all systems reviewed.  Physical Exam: Blood pressure 150/87, pulse 84, temperature 97.4 F (36.3 C), temperature source Oral, resp. rate 20, height 5\' 9"  (1.753 m), weight 141 lb 3.2 oz (64.048 kg), SpO2 94.00%. General condition_ alert and awake, not in distress HE ENT- pupils equal and reactive, neck supple Respiratory-clear lung fined CVS-S1 and S2 heard, no murmur or gallop Abdomen- soft and lax, bowel sound+ Ext- generalized muscle spasm and tenderness   Basename 09/11/12 2153  WBC 14.4*  NEUTROABS 10.5*  HGB 15.0  HCT 42.2  MCV 86.7  PLT 207    Basename 09/11/12 2245 09/11/12 2153  NA 140 137  K 3.1* 3.0*  CL 98 92*  CO2 33* 34*  GLUCOSE 93 95  BUN 28* 28*  CREATININE 1.64* 1.70*  CALCIUM 8.5 9.3  MG -- --  lablast2(ast:2,ALT:2,alkphos:2,bilitot:2,prot:2,albumin:2)@    Recent Results (from the past 240 hour(s))  URINE CULTURE     Status: Normal   Collection Time  09/06/12 10:28 PM      Component Value Range Status Comment   Specimen Description URINE, CLEAN CATCH   Final    Special Requests NONE   Final    Culture  Setup Time 09/07/2012 23:24   Final    Colony Count 3,000 COLONIES/ML   Final    Culture INSIGNIFICANT GROWTH   Final    Report Status 09/08/2012 FINAL   Final      Ct Abdomen Pelvis Wo Contrast  09/06/2012  *RADIOLOGY REPORT*  Clinical Data: Right flank pain for 1 month.  Chronic pain.  CT ABDOMEN AND PELVIS WITHOUT CONTRAST  Technique:  Multidetector CT imaging of the abdomen and pelvis was performed following the standard protocol without intravenous contrast.  Comparison: None.  Findings: Lung Bases: Mild dependent atelectasis.  Liver:  Grossly normal. Unenhanced CT was performed per  clinician order.  Lack of IV contrast limits sensitivity and specificity, especially for evaluation of abdominal/pelvic solid viscera.  Spleen:  Normal.  Gallbladder:  Normal.  Common bile duct:  Normal.  Pancreas:  Normal.  Adrenal glands:  Normal.  Kidneys:  No renal calculi.  Ureters appear normal.  Stomach:  Grossly normal.  Small bowel:  Normal.  Colon:   Normal.  Pelvic Genitourinary:  Normal.  Bones:  No aggressive osseous lesions. L5-S1 predominant lumbar spondylosis.  Vasculature: Atherosclerosis.  No acute vascular abnormality for noncontrast study.  IMPRESSION: No acute abnormality.   Original Report Authenticated By: Andreas Newport, M.D.    Dg Chest Port 1 View  09/06/2012  *RADIOLOGY REPORT*  Clinical Data: Leukocytosis  PORTABLE CHEST - 1 VIEW  Comparison: 12/10/2011  Findings: Lungs are clear. No pleural effusion or pneumothorax. The cardiomediastinal contours are within normal limits. The visualized bones and soft tissues are without significant appreciable abnormality.  IMPRESSION: No radiographic evidence of acute cardiopulmonary process.   Original Report Authenticated By: Jearld Lesch, M.D.    Impression: Principal Problem:  *Generalized muscle ache  Active Problems:  HEPATITIS C  MYOSITIS  Tachycardia  Dehydration  Elevated LFTs  Chronic pain syndrome  Hyperkalemia  Hyponatremia  Polymyositis  Leukocytosis  Muscle weakness (generalized  Active Problems:  * No active hospital problems. *      Plan:  -Continue Iv hydration We wiill monitor CPK and BMP We will replace K+.      Gokul Waybright Pager [phone]  09/12/2012, 2:30 PM

## 2012-09-12 NOTE — Progress Notes (Signed)
Patient refusing to have the bed alarm on.  Explained to the patient the importance of the bed alarm and that it was for his safety.  He absolutely refused stating that he would just leave if the alarm was on.

## 2012-09-13 DIAGNOSIS — N19 Unspecified kidney failure: Secondary | ICD-10-CM | POA: Diagnosis not present

## 2012-09-13 DIAGNOSIS — E876 Hypokalemia: Secondary | ICD-10-CM | POA: Diagnosis not present

## 2012-09-13 DIAGNOSIS — M6282 Rhabdomyolysis: Secondary | ICD-10-CM | POA: Diagnosis not present

## 2012-09-13 LAB — URINE CULTURE

## 2012-09-13 LAB — BASIC METABOLIC PANEL
CO2: 31 mEq/L (ref 19–32)
Calcium: 8.2 mg/dL — ABNORMAL LOW (ref 8.4–10.5)
Chloride: 88 mEq/L — ABNORMAL LOW (ref 96–112)
Glucose, Bld: 101 mg/dL — ABNORMAL HIGH (ref 70–99)
Sodium: 132 mEq/L — ABNORMAL LOW (ref 135–145)

## 2012-09-13 MED ORDER — POTASSIUM CHLORIDE 10 MEQ/100ML IV SOLN
10.0000 meq | INTRAVENOUS | Status: AC
  Administered 2012-09-13 (×2): 10 meq via INTRAVENOUS
  Filled 2012-09-13: qty 200

## 2012-09-13 NOTE — Progress Notes (Signed)
Subjective: Patient is improving. His CPk level is improving. He has less muscle pain.  Objective: Vital signs in last 24 hours: Temp:  [98 F (36.7 C)-98.8 F (37.1 C)] 98.8 F (37.1 C) (12/27 0529) Pulse Rate:  [80-89] 80  (12/27 0529) Resp:  [20] 20  (12/27 0529) BP: (137-159)/(83-98) 159/98 mmHg (12/27 0529) SpO2:  [96 %-98 %] 97 % (12/27 0529) Weight change:  Last BM Date: 09/10/12  Intake/Output from previous day: 12/26 0701 - 12/27 0700 In: 4656 [P.O.:1196; I.V.:3460] Out: 2000 [Urine:2000]  PHYSICAL EXAM General appearance: alert and no distress Resp: clear to auscultation bilaterally Cardio: S1, S2 normal GI: soft, non-tender; bowel sounds normal; no masses,  no organomegaly Extremities: mild muscle spasm and tenderness diffusely  Lab Results:    @labtest @ ABGS No results found for this basename: PHART,PCO2,PO2ART,TCO2,HCO3 in the last 72 hours CULTURES Recent Results (from the past 240 hour(s))  URINE CULTURE     Status: Normal   Collection Time   09/06/12 10:28 PM      Component Value Range Status Comment   Specimen Description URINE, CLEAN CATCH   Final    Special Requests NONE   Final    Culture  Setup Time 09/07/2012 23:24   Final    Colony Count 3,000 COLONIES/ML   Final    Culture INSIGNIFICANT GROWTH   Final    Report Status 09/08/2012 FINAL   Final   URINE CULTURE     Status: Normal   Collection Time   09/11/12 10:07 PM      Component Value Range Status Comment   Specimen Description URINE, CLEAN CATCH   Final    Special Requests NONE   Final    Culture  Setup Time 09/11/2012 22:30   Final    Colony Count NO GROWTH   Final    Culture NO GROWTH   Final    Report Status 09/13/2012 FINAL   Final    Studies/Results: No results found.  Medications: I have reviewed the patient's current medications.  Assesment:  Generalized muscle ache  Active Problems:  HEPATITIS C  MYOSITIS  Tachycardia  Dehydration  Elevated LFTs  Chronic pain  syndrome  Hypokalemia  Hyponatremia  Polymyositis  Leukocytosis  Muscle weakness (generalized   Plan: Continue Iv fluids We will monitor CPK and BMP Will supplement K+ Current treatment    LOS: 2 days   Ramaj Frangos 09/13/2012, 8:30 AM

## 2012-09-14 DIAGNOSIS — M6282 Rhabdomyolysis: Secondary | ICD-10-CM | POA: Diagnosis not present

## 2012-09-14 LAB — BASIC METABOLIC PANEL
BUN: 22 mg/dL (ref 6–23)
CO2: 33 mEq/L — ABNORMAL HIGH (ref 19–32)
GFR calc non Af Amer: 48 mL/min — ABNORMAL LOW (ref >90)
Glucose, Bld: 110 mg/dL — ABNORMAL HIGH (ref 70–99)
Potassium: 2.9 mEq/L — ABNORMAL LOW (ref 3.5–5.1)

## 2012-09-14 MED ORDER — HYDROMORPHONE HCL PF 1 MG/ML IJ SOLN
1.0000 mg | INTRAMUSCULAR | Status: DC | PRN
  Administered 2012-09-14 – 2012-09-15 (×11): 1 mg via INTRAVENOUS
  Filled 2012-09-14 (×11): qty 1

## 2012-09-14 MED ORDER — POTASSIUM CHLORIDE 10 MEQ/100ML IV SOLN
10.0000 meq | INTRAVENOUS | Status: AC
  Administered 2012-09-14 (×5): 10 meq via INTRAVENOUS
  Filled 2012-09-14 (×5): qty 100

## 2012-09-14 MED ORDER — POTASSIUM CHLORIDE CRYS ER 20 MEQ PO TBCR
20.0000 meq | EXTENDED_RELEASE_TABLET | Freq: Two times a day (BID) | ORAL | Status: DC
  Administered 2012-09-14 – 2012-09-15 (×3): 20 meq via ORAL
  Filled 2012-09-14 (×3): qty 1

## 2012-09-14 MED ORDER — POTASSIUM CHLORIDE 10 MEQ/100ML IV SOLN
10.0000 meq | Freq: Once | INTRAVENOUS | Status: AC
  Administered 2012-09-14: 10 meq via INTRAVENOUS
  Filled 2012-09-14: qty 100

## 2012-09-14 NOTE — Progress Notes (Signed)
Subjective: He says he's better but still having significant pain in his current pain medication is not working. He says he has too much pain to put his feet flat on the floor.  Objective: Vital signs in last 24 hours: Temp:  [98.1 F (36.7 C)-98.5 F (36.9 C)] 98.1 F (36.7 C) (12/28 0500) Pulse Rate:  [89-94] 89  (12/28 0500) Resp:  [16-20] 16  (12/28 0500) BP: (159-165)/(89-97) 165/97 mmHg (12/28 0500) SpO2:  [95 %-97 %] 97 % (12/28 0500) Weight change:  Last BM Date: 09/13/12  Intake/Output from previous day: 12/27 0701 - 12/28 0700 In: 360 [P.O.:360] Out: 850 [Urine:850]  PHYSICAL EXAM General appearance: alert, cooperative and mild distress Resp: clear to auscultation bilaterally Cardio: regular rate and rhythm, S1, S2 normal, no murmur, click, rub or gallop GI: soft, non-tender; bowel sounds normal; no masses,  no organomegaly Extremities: Mild tenderness in the calves bilaterally  Lab Results:    Basic Metabolic Panel:  Basename 09/14/12 0447 09/13/12 0507  NA 137 132*  K 2.9* 3.0*  CL 99 88*  CO2 33* 31  GLUCOSE 110* 101*  BUN 22 28*  CREATININE 1.66* 1.70*  CALCIUM 8.3* 8.2*  MG -- --  PHOS -- --   Liver Function Tests: No results found for this basename: AST:2,ALT:2,ALKPHOS:2,BILITOT:2,PROT:2,ALBUMIN:2 in the last 72 hours No results found for this basename: LIPASE:2,AMYLASE:2 in the last 72 hours No results found for this basename: AMMONIA:2 in the last 72 hours CBC:  Basename 09/11/12 2153  WBC 14.4*  NEUTROABS 10.5*  HGB 15.0  HCT 42.2  MCV 86.7  PLT 207   Cardiac Enzymes:  Basename 09/14/12 0447 09/13/12 0507 09/11/12 2153  CKTOTAL 3032* 3900* 8381*  CKMB -- -- --  CKMBINDEX -- -- --  TROPONINI -- -- --   BNP: No results found for this basename: PROBNP:3 in the last 72 hours D-Dimer: No results found for this basename: DDIMER:2 in the last 72 hours CBG: No results found for this basename: GLUCAP:6 in the last 72 hours Hemoglobin  A1C: No results found for this basename: HGBA1C in the last 72 hours Fasting Lipid Panel: No results found for this basename: CHOL,HDL,LDLCALC,TRIG,CHOLHDL,LDLDIRECT in the last 72 hours Thyroid Function Tests: No results found for this basename: TSH,T4TOTAL,FREET4,T3FREE,THYROIDAB in the last 72 hours Anemia Panel: No results found for this basename: VITAMINB12,FOLATE,FERRITIN,TIBC,IRON,RETICCTPCT in the last 72 hours Coagulation: No results found for this basename: LABPROT:2,INR:2 in the last 72 hours Urine Drug Screen: Drugs of Abuse     Component Value Date/Time   LABOPIA NONE DETECTED 09/12/2012 1021   COCAINSCRNUR NONE DETECTED 09/12/2012 1021   LABBENZ NONE DETECTED 09/12/2012 1021   AMPHETMU NONE DETECTED 09/12/2012 1021   THCU NONE DETECTED 09/12/2012 1021   LABBARB NONE DETECTED 09/12/2012 1021    Alcohol Level: No results found for this basename: ETH:2 in the last 72 hours Urinalysis:  Basename 09/11/12 2207  COLORURINE YELLOW  LABSPEC 1.010  PHURINE 6.5  GLUCOSEU NEGATIVE  HGBUR LARGE*  BILIRUBINUR NEGATIVE  KETONESUR NEGATIVE  PROTEINUR NEGATIVE  UROBILINOGEN 0.2  NITRITE NEGATIVE  LEUKOCYTESUR NEGATIVE   Misc. Labs:  ABGS No results found for this basename: PHART,PCO2,PO2ART,TCO2,HCO3 in the last 72 hours CULTURES Recent Results (from the past 240 hour(s))  URINE CULTURE     Status: Normal   Collection Time   09/06/12 10:28 PM      Component Value Range Status Comment   Specimen Description URINE, CLEAN CATCH   Final    Special Requests NONE  Final    Culture  Setup Time 09/07/2012 23:24   Final    Colony Count 3,000 COLONIES/ML   Final    Culture INSIGNIFICANT GROWTH   Final    Report Status 09/08/2012 FINAL   Final   URINE CULTURE     Status: Normal   Collection Time   09/11/12 10:07 PM      Component Value Range Status Comment   Specimen Description URINE, CLEAN CATCH   Final    Special Requests NONE   Final    Culture  Setup Time  09/11/2012 22:30   Final    Colony Count NO GROWTH   Final    Culture NO GROWTH   Final    Report Status 09/13/2012 FINAL   Final    Studies/Results: No results found.  Medications:  Prior to Admission:  Prescriptions prior to admission  Medication Sig Dispense Refill  . aspirin EC 81 MG tablet Take 81-324 mg by mouth daily as needed. For headache pain      . ibuprofen (ADVIL,MOTRIN) 200 MG tablet Take 400 mg by mouth every 6 (six) hours as needed. Pain      . Oxycodone HCl 10 MG TABS Take 1 tablet (10 mg total) by mouth 2 (two) times daily as needed.  20 tablet  0  . ranitidine (ZANTAC) 150 MG tablet Take 150 mg by mouth 2 (two) times daily as needed. FOR ACID REFLUX/GERD/INDIGESTION       Scheduled:   . enoxaparin  40 mg Subcutaneous QHS  . potassium chloride  10 mEq Intravenous Q1 Hr x 6  . potassium chloride  20 mEq Oral BID   Continuous:   . sodium chloride 75 mL/hr at 09/14/12 0434   ZOX:WRUEAVW EC, HYDROmorphone, oxyCODONE  Assesment: He has myositis. His CPK has come down more. He is very hypokalemic this morning. His renal function is improving Active Problems:  * No active hospital problems. *     Plan: I added more pain medication for now. I encouraged him to get up and move more because I think that will help. He will have potassium replacement. Repeat laboratory work in the morning    LOS: 3 days   Ailin Rochford L 09/14/2012, 8:21 AM

## 2012-09-15 DIAGNOSIS — M6282 Rhabdomyolysis: Secondary | ICD-10-CM | POA: Diagnosis not present

## 2012-09-15 LAB — BASIC METABOLIC PANEL
BUN: 19 mg/dL (ref 6–23)
CO2: 30 mEq/L (ref 19–32)
Chloride: 98 mEq/L (ref 96–112)
Creatinine, Ser: 1.54 mg/dL — ABNORMAL HIGH (ref 0.50–1.35)
Glucose, Bld: 107 mg/dL — ABNORMAL HIGH (ref 70–99)
Potassium: 3.6 mEq/L (ref 3.5–5.1)

## 2012-09-15 MED ORDER — OXYCODONE HCL 15 MG PO TABS: 15.0000 mg | ORAL_TABLET | Freq: Three times a day (TID) | ORAL | Status: DC | PRN

## 2012-09-15 NOTE — Progress Notes (Signed)
Discharged to home. Escorted by staff per wheelchair to front exit. Wife waiting a door.

## 2012-09-15 NOTE — Progress Notes (Signed)
Orders given for discharge. IV access removed. Discharge instructions and prescription given. Waiting on wife for ride. Vitals signs stable.

## 2012-09-15 NOTE — Progress Notes (Signed)
Subjective: He says he feels better and wants to go home. He has been able to ambulate. He has a headache which he relates to hydromorphone  Objective: Vital signs in last 24 hours: Temp:  [98.1 F (36.7 C)-98.3 F (36.8 C)] 98.3 F (36.8 C) (12/29 0500) Pulse Rate:  [81-90] 90  (12/29 0500) Resp:  [18-20] 20  (12/29 0500) BP: (142-158)/(87-98) 142/89 mmHg (12/29 0500) SpO2:  [96 %-97 %] 97 % (12/29 0500) Weight change:  Last BM Date: 09/13/12  Intake/Output from previous day: 12/28 0701 - 12/29 0700 In: 5075 [P.O.:920; I.V.:4155] Out: -   PHYSICAL EXAM General appearance: alert, cooperative and no distress Resp: clear to auscultation bilaterally Cardio: regular rate and rhythm, S1, S2 normal, no murmur, click, rub or gallop GI: soft, non-tender; bowel sounds normal; no masses,  no organomegaly Extremities: extremities normal, atraumatic, no cyanosis or edema  Lab Results:    Basic Metabolic Panel:  Basename 09/15/12 0548 09/14/12 0447  NA 137 137  K 3.6 2.9*  CL 98 99  CO2 30 33*  GLUCOSE 107* 110*  BUN 19 22  CREATININE 1.54* 1.66*  CALCIUM 8.6 8.3*  MG -- --  PHOS -- --   Liver Function Tests: No results found for this basename: AST:2,ALT:2,ALKPHOS:2,BILITOT:2,PROT:2,ALBUMIN:2 in the last 72 hours No results found for this basename: LIPASE:2,AMYLASE:2 in the last 72 hours No results found for this basename: AMMONIA:2 in the last 72 hours CBC: No results found for this basename: WBC:2,NEUTROABS:2,HGB:2,HCT:2,MCV:2,PLT:2 in the last 72 hours Cardiac Enzymes:  Basename 09/15/12 0548 09/14/12 0447 09/13/12 0507  CKTOTAL 1830* 3032* 3900*  CKMB -- -- --  CKMBINDEX -- -- --  TROPONINI -- -- --   BNP: No results found for this basename: PROBNP:3 in the last 72 hours D-Dimer: No results found for this basename: DDIMER:2 in the last 72 hours CBG: No results found for this basename: GLUCAP:6 in the last 72 hours Hemoglobin A1C: No results found for this  basename: HGBA1C in the last 72 hours Fasting Lipid Panel: No results found for this basename: CHOL,HDL,LDLCALC,TRIG,CHOLHDL,LDLDIRECT in the last 72 hours Thyroid Function Tests: No results found for this basename: TSH,T4TOTAL,FREET4,T3FREE,THYROIDAB in the last 72 hours Anemia Panel: No results found for this basename: VITAMINB12,FOLATE,FERRITIN,TIBC,IRON,RETICCTPCT in the last 72 hours Coagulation: No results found for this basename: LABPROT:2,INR:2 in the last 72 hours Urine Drug Screen: Drugs of Abuse     Component Value Date/Time   LABOPIA NONE DETECTED 09/12/2012 1021   COCAINSCRNUR NONE DETECTED 09/12/2012 1021   LABBENZ NONE DETECTED 09/12/2012 1021   AMPHETMU NONE DETECTED 09/12/2012 1021   THCU NONE DETECTED 09/12/2012 1021   LABBARB NONE DETECTED 09/12/2012 1021    Alcohol Level: No results found for this basename: ETH:2 in the last 72 hours Urinalysis: No results found for this basename: COLORURINE:2,APPERANCEUR:2,LABSPEC:2,PHURINE:2,GLUCOSEU:2,HGBUR:2,BILIRUBINUR:2,KETONESUR:2,PROTEINUR:2,UROBILINOGEN:2,NITRITE:2,LEUKOCYTESUR:2 in the last 72 hours Misc. Labs:  ABGS No results found for this basename: PHART,PCO2,PO2ART,TCO2,HCO3 in the last 72 hours CULTURES Recent Results (from the past 240 hour(s))  URINE CULTURE     Status: Normal   Collection Time   09/06/12 10:28 PM      Component Value Range Status Comment   Specimen Description URINE, CLEAN CATCH   Final    Special Requests NONE   Final    Culture  Setup Time 09/07/2012 23:24   Final    Colony Count 3,000 COLONIES/ML   Final    Culture INSIGNIFICANT GROWTH   Final    Report Status 09/08/2012 FINAL   Final  URINE CULTURE     Status: Normal   Collection Time   09/11/12 10:07 PM      Component Value Range Status Comment   Specimen Description URINE, CLEAN CATCH   Final    Special Requests NONE   Final    Culture  Setup Time 09/11/2012 22:30   Final    Colony Count NO GROWTH   Final    Culture NO  GROWTH   Final    Report Status 09/13/2012 FINAL   Final    Studies/Results: No results found.  Medications:  Prior to Admission:  Prescriptions prior to admission  Medication Sig Dispense Refill  . aspirin EC 81 MG tablet Take 81-324 mg by mouth daily as needed. For headache pain      . ibuprofen (ADVIL,MOTRIN) 200 MG tablet Take 400 mg by mouth every 6 (six) hours as needed. Pain      . ranitidine (ZANTAC) 150 MG tablet Take 150 mg by mouth 2 (two) times daily as needed. FOR ACID REFLUX/GERD/INDIGESTION      . [DISCONTINUED] Oxycodone HCl 10 MG TABS Take 1 tablet (10 mg total) by mouth 2 (two) times daily as needed.  20 tablet  0   Scheduled:   . enoxaparin  40 mg Subcutaneous QHS  . potassium chloride  20 mEq Oral BID   Continuous:   . sodium chloride 75 mL/hr at 09/15/12 0101   YQM:VHQIONG EC, HYDROmorphone, oxyCODONE  Assesment: He has some form of myositis. His CPK is much improved. He feels better and wants to go home. Active Problems:  * No active hospital problems. *     Plan: Discharge home he will need close followup    LOS: 4 days   [name] L 09/15/2012, 10:38 AM

## 2012-09-18 NOTE — Discharge Summary (Signed)
Physician Discharge Summary  Patient ID: [name] MRN: [id number] DOB/AGE: 03/20/1966 47 y.o. Primary Care Physician:FANTA,TESFAYE, MD Admit date: 09/11/2012 Discharge date: 09/18/2012    Discharge Diagnoses:  Myositis Rhabdomyolysis secondary to above Acute on chronic renal failure Depression Hepatitis C Cirrhosis History of alcohol and cocaine abuse Active Problems:  * No active hospital problems. *      Medication List     As of 09/18/2012  9:53 AM    TAKE these medications         aspirin EC 81 MG tablet   Take 81-324 mg by mouth daily as needed. For headache pain      ibuprofen 200 MG tablet   Commonly known as: ADVIL,MOTRIN   Take 400 mg by mouth every 6 (six) hours as needed. Pain      oxyCODONE 15 MG immediate release tablet   Commonly known as: ROXICODONE   Take 1 tablet (15 mg total) by mouth every 8 (eight) hours as needed for pain.      ranitidine 150 MG tablet   Commonly known as: ZANTAC   Take 150 mg by mouth 2 (two) times daily as needed. FOR ACID REFLUX/GERD/INDIGESTION        Discharged Condition: Improved    Consults: None  Significant Diagnostic Studies: Ct Abdomen Pelvis Wo Contrast  09/06/2012  *RADIOLOGY REPORT*  Clinical Data: Right flank pain for 1 month.  Chronic pain.  CT ABDOMEN AND PELVIS WITHOUT CONTRAST  Technique:  Multidetector CT imaging of the abdomen and pelvis was performed following the standard protocol without intravenous contrast.  Comparison: None.  Findings: Lung Bases: Mild dependent atelectasis.  Liver:  Grossly normal. Unenhanced CT was performed per clinician order.  Lack of IV contrast limits sensitivity and specificity, especially for evaluation of abdominal/pelvic solid viscera.  Spleen:  Normal.  Gallbladder:  Normal.  Common bile duct:  Normal.  Pancreas:  Normal.  Adrenal glands:  Normal.  Kidneys:  No renal calculi.  Ureters appear normal.  Stomach:  Grossly normal.  Small bowel:  Normal.  Colon:   Normal.   Pelvic Genitourinary:  Normal.  Bones:  No aggressive osseous lesions. L5-S1 predominant lumbar spondylosis.  Vasculature: Atherosclerosis.  No acute vascular abnormality for noncontrast study.  IMPRESSION: No acute abnormality.   Original Report Authenticated By: Andreas Newport, M.D.    Dg Chest Port 1 View  09/06/2012  *RADIOLOGY REPORT*  Clinical Data: Leukocytosis  PORTABLE CHEST - 1 VIEW  Comparison: 12/10/2011  Findings: Lungs are clear. No pleural effusion or pneumothorax. The cardiomediastinal contours are within normal limits. The visualized bones and soft tissues are without significant appreciable abnormality.  IMPRESSION: No radiographic evidence of acute cardiopulmonary process.   Original Report Authenticated By: Jearld Lesch, M.D.     Lab Results: Basic Metabolic Panel: No results found for this basename: NA:2,K:2,CL:2,CO2:2,GLUCOSE:2,BUN:2,CREATININE:2,CALCIUM:2,MG:2,PHOS:2 in the last 72 hours Liver Function Tests: No results found for this basename: AST:2,ALT:2,ALKPHOS:2,BILITOT:2,PROT:2,ALBUMIN:2 in the last 72 hours   CBC: No results found for this basename: WBC:2,NEUTROABS:2,HGB:2,HCT:2,MCV:2,PLT:2 in the last 72 hours  Recent Results (from the past 240 hour(s))  URINE CULTURE     Status: Normal   Collection Time   09/11/12 10:07 PM      Component Value Range Status Comment   Specimen Description URINE, CLEAN CATCH   Final    Special Requests NONE   Final    Culture  Setup Time 09/11/2012 22:30   Final    Colony Count NO GROWTH  Final    Culture NO GROWTH   Final    Report Status 09/13/2012 FINAL   Final      Hospital Course: This is a 47 year old who had been in the hospital with rhabdomyolysis secondary to myositis and who signed out AMA at that at [facility]. He came back to the emergency room complaining of muscle pain and although his CPK was much improved it was still elevated. He was started on IV fluids his CPK continued to decrease his renal function  improved and he was able to ambulate. At the time of discharge he said he was comfortable. He said that he had been dismissed by Dr. Felecia Shelling because of checking out AMA and I agreed to see him for a 30 day period about this problem but I do not want to take him on as a primary care patient and I informed him of that. He says he will find a primary care physician and in the meantime I will try to refer him to a rheumatologist.  Discharge Exam: Blood pressure 142/89, pulse 90, temperature 98.3 F (36.8 C), temperature source Oral, resp. rate 20, height 5\' 9"  (1.753 m), weight 64.048 kg (141 lb 3.2 oz), SpO2 97.00%. He is awake and alert. His chest is clear. His heart is regular   Disposition: home he will need followup with a primary care physician and with a rheumatologist       Discharge Orders    Future Orders Please Complete By Expires   Discharge patient           Signed: Fredirick Maudlin Pager [phone]  09/18/2012, 9:53 AM

## 2012-09-23 ENCOUNTER — Encounter: Payer: Self-pay | Admitting: *Deleted

## 2012-10-10 ENCOUNTER — Telehealth: Payer: Self-pay | Admitting: Urgent Care

## 2012-10-10 ENCOUNTER — Ambulatory Visit: Payer: Medicare Other | Admitting: Urgent Care

## 2012-10-10 NOTE — Telephone Encounter (Signed)
Pt was a no show

## 2012-10-10 NOTE — Telephone Encounter (Signed)
PCP is aware  

## 2012-10-11 NOTE — Telephone Encounter (Signed)
noted 

## 2012-10-24 DIAGNOSIS — Z79899 Other long term (current) drug therapy: Secondary | ICD-10-CM | POA: Diagnosis not present

## 2012-10-24 DIAGNOSIS — Z5181 Encounter for therapeutic drug level monitoring: Secondary | ICD-10-CM | POA: Diagnosis not present

## 2013-03-17 ENCOUNTER — Encounter (HOSPITAL_COMMUNITY): Payer: Self-pay | Admitting: *Deleted

## 2013-03-17 ENCOUNTER — Emergency Department (HOSPITAL_COMMUNITY)
Admission: EM | Admit: 2013-03-17 | Discharge: 2013-03-17 | Discharge status: Against Medical Advice | Payer: Medicare Other | Attending: Emergency Medicine | Admitting: Emergency Medicine

## 2013-03-17 DIAGNOSIS — M79609 Pain in unspecified limb: Secondary | ICD-10-CM | POA: Diagnosis not present

## 2013-03-17 NOTE — ED Notes (Signed)
bil  Leg and back pain.

## 2013-05-02 ENCOUNTER — Encounter (HOSPITAL_COMMUNITY): Payer: Self-pay

## 2013-05-02 ENCOUNTER — Inpatient Hospital Stay (HOSPITAL_COMMUNITY)
Admission: EM | Admit: 2013-05-02 | Discharge: 2013-05-04 | DRG: 641 | Discharge status: Against Medical Advice | Payer: Medicare Other | Attending: Internal Medicine | Admitting: Internal Medicine

## 2013-05-02 DIAGNOSIS — R197 Diarrhea, unspecified: Secondary | ICD-10-CM

## 2013-05-02 DIAGNOSIS — G894 Chronic pain syndrome: Secondary | ICD-10-CM | POA: Diagnosis present

## 2013-05-02 DIAGNOSIS — F17200 Nicotine dependence, unspecified, uncomplicated: Secondary | ICD-10-CM | POA: Diagnosis present

## 2013-05-02 DIAGNOSIS — B1920 Unspecified viral hepatitis C without hepatic coma: Secondary | ICD-10-CM | POA: Diagnosis present

## 2013-05-02 DIAGNOSIS — F1020 Alcohol dependence, uncomplicated: Secondary | ICD-10-CM | POA: Diagnosis present

## 2013-05-02 DIAGNOSIS — B1710 Acute hepatitis C without hepatic coma: Secondary | ICD-10-CM | POA: Diagnosis present

## 2013-05-02 DIAGNOSIS — F3289 Other specified depressive episodes: Secondary | ICD-10-CM | POA: Diagnosis present

## 2013-05-02 DIAGNOSIS — E871 Hypo-osmolality and hyponatremia: Secondary | ICD-10-CM | POA: Diagnosis not present

## 2013-05-02 DIAGNOSIS — Z720 Tobacco use: Secondary | ICD-10-CM | POA: Diagnosis present

## 2013-05-02 DIAGNOSIS — F1410 Cocaine abuse, uncomplicated: Secondary | ICD-10-CM | POA: Diagnosis present

## 2013-05-02 DIAGNOSIS — M3320 Polymyositis, organ involvement unspecified: Secondary | ICD-10-CM

## 2013-05-02 DIAGNOSIS — F329 Major depressive disorder, single episode, unspecified: Secondary | ICD-10-CM | POA: Diagnosis present

## 2013-05-02 DIAGNOSIS — R112 Nausea with vomiting, unspecified: Secondary | ICD-10-CM | POA: Diagnosis present

## 2013-05-02 DIAGNOSIS — K7030 Alcoholic cirrhosis of liver without ascites: Secondary | ICD-10-CM | POA: Diagnosis present

## 2013-05-02 DIAGNOSIS — E860 Dehydration: Secondary | ICD-10-CM | POA: Diagnosis present

## 2013-05-02 DIAGNOSIS — M791 Myalgia, unspecified site: Secondary | ICD-10-CM

## 2013-05-02 DIAGNOSIS — F1910 Other psychoactive substance abuse, uncomplicated: Secondary | ICD-10-CM | POA: Diagnosis present

## 2013-05-02 DIAGNOSIS — IMO0001 Reserved for inherently not codable concepts without codable children: Secondary | ICD-10-CM | POA: Diagnosis present

## 2013-05-02 DIAGNOSIS — F1010 Alcohol abuse, uncomplicated: Secondary | ICD-10-CM | POA: Diagnosis present

## 2013-05-02 DIAGNOSIS — M549 Dorsalgia, unspecified: Secondary | ICD-10-CM | POA: Diagnosis present

## 2013-05-02 LAB — COMPREHENSIVE METABOLIC PANEL
AST: 33 U/L (ref 0–37)
Albumin: 4.2 g/dL (ref 3.5–5.2)
Alkaline Phosphatase: 42 U/L (ref 39–117)
BUN: 5 mg/dL — ABNORMAL LOW (ref 6–23)
CO2: 25 mEq/L (ref 19–32)
Chloride: 78 mEq/L — ABNORMAL LOW (ref 96–112)
Creatinine, Ser: 0.58 mg/dL (ref 0.50–1.35)
GFR calc non Af Amer: >90 mL/min (ref >90)
Potassium: 4.2 mEq/L (ref 3.5–5.1)
Total Bilirubin: 0.9 mg/dL (ref 0.3–1.2)

## 2013-05-02 LAB — CBC WITH DIFFERENTIAL/PLATELET
Basophils Relative: 0 % (ref 0–1)
Eosinophils Absolute: 0.1 10*3/uL (ref 0.0–0.7)
Eosinophils Relative: 1 % (ref 0–5)
HCT: 42.2 % (ref 39.0–52.0)
Hemoglobin: 16 g/dL (ref 13.0–17.0)
MCH: 31.1 pg (ref 26.0–34.0)
MCHC: 37.9 g/dL — ABNORMAL HIGH (ref 30.0–36.0)
MCV: 81.9 fL (ref 78.0–100.0)
Monocytes Absolute: 0.9 10*3/uL (ref 0.1–1.0)
Neutro Abs: 5.6 10*3/uL (ref 1.7–7.7)
Neutrophils Relative %: 62 % (ref 43–77)
RBC: 5.15 MIL/uL (ref 4.22–5.81)

## 2013-05-02 LAB — BASIC METABOLIC PANEL
BUN: 7 mg/dL (ref 6–23)
CO2: 29 mEq/L (ref 19–32)
Calcium: 8.5 mg/dL (ref 8.4–10.5)
Chloride: 86 mEq/L — ABNORMAL LOW (ref 96–112)
Creatinine, Ser: 0.66 mg/dL (ref 0.50–1.35)
GFR calc non Af Amer: >90 mL/min (ref >90)
Potassium: 3.7 mEq/L (ref 3.5–5.1)
Sodium: 117 mEq/L — CL (ref 135–145)

## 2013-05-02 LAB — RAPID URINE DRUG SCREEN, HOSP PERFORMED
Barbiturates: NOT DETECTED
Cocaine: NOT DETECTED
Opiates: NOT DETECTED
Tetrahydrocannabinol: NOT DETECTED

## 2013-05-02 LAB — CK: Total CK: 141 U/L (ref 7–232)

## 2013-05-02 MED ORDER — ADULT MULTIVITAMIN W/MINERALS CH
1.0000 | ORAL_TABLET | Freq: Every day | ORAL | Status: DC
  Administered 2013-05-02 – 2013-05-03 (×2): 1 via ORAL
  Filled 2013-05-02 (×2): qty 1

## 2013-05-02 MED ORDER — SODIUM CHLORIDE 0.9 % IV SOLN
INTRAVENOUS | Status: DC
  Administered 2013-05-02 – 2013-05-04 (×4): via INTRAVENOUS

## 2013-05-02 MED ORDER — TRAMADOL HCL 50 MG PO TABS: 50.0000 mg | ORAL_TABLET | Freq: Four times a day (QID) | ORAL | Status: DC | PRN

## 2013-05-02 MED ORDER — LORAZEPAM 1 MG PO TABS: 1.0000 mg | ORAL_TABLET | Freq: Four times a day (QID) | ORAL | Status: DC | PRN

## 2013-05-02 MED ORDER — HYDROMORPHONE HCL PF 1 MG/ML IJ SOLN
0.5000 mg | INTRAMUSCULAR | Status: DC | PRN
  Administered 2013-05-02 – 2013-05-04 (×9): 0.5 mg via INTRAVENOUS
  Filled 2013-05-02 (×9): qty 1

## 2013-05-02 MED ORDER — PANTOPRAZOLE SODIUM 40 MG IV SOLR
40.0000 mg | INTRAVENOUS | Status: DC
  Administered 2013-05-02 – 2013-05-03 (×2): 40 mg via INTRAVENOUS
  Filled 2013-05-02 (×2): qty 40

## 2013-05-02 MED ORDER — THIAMINE HCL 100 MG/ML IJ SOLN: 100.0000 mg | Freq: Every day | INTRAMUSCULAR | Status: DC

## 2013-05-02 MED ORDER — HYDROMORPHONE HCL PF 1 MG/ML IJ SOLN
1.0000 mg | Freq: Once | INTRAMUSCULAR | Status: AC
  Administered 2013-05-02: 1 mg via INTRAVENOUS
  Filled 2013-05-02: qty 1

## 2013-05-02 MED ORDER — LORAZEPAM 2 MG/ML IJ SOLN
1.0000 mg | Freq: Four times a day (QID) | INTRAMUSCULAR | Status: DC | PRN
  Administered 2013-05-03 – 2013-05-04 (×4): 1 mg via INTRAVENOUS
  Filled 2013-05-02 (×4): qty 1

## 2013-05-02 MED ORDER — FOLIC ACID 1 MG PO TABS
1.0000 mg | ORAL_TABLET | Freq: Every day | ORAL | Status: DC
  Administered 2013-05-02 – 2013-05-03 (×2): 1 mg via ORAL
  Filled 2013-05-02 (×2): qty 1

## 2013-05-02 MED ORDER — ONDANSETRON HCL 4 MG/2ML IJ SOLN: 4.0000 mg | Freq: Four times a day (QID) | INTRAMUSCULAR | Status: DC | PRN

## 2013-05-02 MED ORDER — ALUM & MAG HYDROXIDE-SIMETH 200-200-20 MG/5ML PO SUSP
30.0000 mL | Freq: Four times a day (QID) | ORAL | Status: DC | PRN
Start: 2013-05-02 — End: 2013-05-04

## 2013-05-02 MED ORDER — SODIUM CHLORIDE 0.9 % IV BOLUS (SEPSIS)
1000.0000 mL | Freq: Once | INTRAVENOUS | Status: AC
  Administered 2013-05-02: 1000 mL via INTRAVENOUS

## 2013-05-02 MED ORDER — ONDANSETRON HCL 4 MG/2ML IJ SOLN
4.0000 mg | Freq: Once | INTRAMUSCULAR | Status: AC
  Administered 2013-05-02: 4 mg via INTRAVENOUS
  Filled 2013-05-02: qty 2

## 2013-05-02 MED ORDER — ALBUTEROL SULFATE (5 MG/ML) 0.5% IN NEBU: 2.5000 mg | INHALATION_SOLUTION | RESPIRATORY_TRACT | Status: DC | PRN

## 2013-05-02 MED ORDER — TRAMADOL HCL 50 MG PO TABS
50.0000 mg | ORAL_TABLET | Freq: Four times a day (QID) | ORAL | Status: DC | PRN
  Administered 2013-05-02: 50 mg via ORAL
  Filled 2013-05-02: qty 1

## 2013-05-02 MED ORDER — VITAMIN B-1 100 MG PO TABS
100.0000 mg | ORAL_TABLET | Freq: Every day | ORAL | Status: DC
  Administered 2013-05-02 – 2013-05-03 (×2): 100 mg via ORAL
  Filled 2013-05-02 (×2): qty 1

## 2013-05-02 MED ORDER — ONDANSETRON HCL 4 MG PO TABS: 4.0000 mg | ORAL_TABLET | Freq: Four times a day (QID) | ORAL | Status: DC | PRN

## 2013-05-02 MED ORDER — ENOXAPARIN SODIUM 40 MG/0.4ML ~~LOC~~ SOLN
40.0000 mg | SUBCUTANEOUS | Status: DC
  Administered 2013-05-02 – 2013-05-03 (×2): 40 mg via SUBCUTANEOUS
  Filled 2013-05-02 (×2): qty 0.4

## 2013-05-02 MED ORDER — ZOLPIDEM TARTRATE 5 MG PO TABS
5.0000 mg | ORAL_TABLET | Freq: Every evening | ORAL | Status: DC | PRN
  Administered 2013-05-03: 5 mg via ORAL
  Filled 2013-05-02: qty 1

## 2013-05-02 NOTE — ED Notes (Signed)
Pt reports severe rt thigh pain for the past few days.  Pt reports having hx of myositis.  Pt denies any injury to his leg.

## 2013-05-02 NOTE — ED Provider Notes (Signed)
Results for orders placed during the hospital encounter of 05/02/13  CBC WITH DIFFERENTIAL      Result Value Range   WBC 9.0  4.0 - 10.5 K/uL   RBC 5.15  4.22 - 5.81 MIL/uL   Hemoglobin 16.0  13.0 - 17.0 g/dL   HCT 16.1  09.6 - 04.5 %   MCV 81.9  78.0 - 100.0 fL   MCH 31.1  26.0 - 34.0 pg   MCHC 37.9 (*) 30.0 - 36.0 g/dL   RDW 40.9  81.1 - 91.4 %   Platelets 182  150 - 400 K/uL   Neutrophils Relative % 62  43 - 77 %   Lymphocytes Relative 27  12 - 46 %   Monocytes Relative 10  3 - 12 %   Eosinophils Relative 1  0 - 5 %   Basophils Relative 0  0 - 1 %   Neutro Abs 5.6  1.7 - 7.7 K/uL   Lymphs Abs 2.4  0.7 - 4.0 K/uL   Monocytes Absolute 0.9  0.1 - 1.0 K/uL   Eosinophils Absolute 0.1  0.0 - 0.7 K/uL   Basophils Absolute 0.0  0.0 - 0.1 K/uL   RBC Morphology SPHEROCYTES     WBC Morphology ATYPICAL LYMPHOCYTES     Smear Review LARGE PLATELETS PRESENT    COMPREHENSIVE METABOLIC PANEL      Result Value Range   Sodium 114 (*) 135 - 145 mEq/L   Potassium 4.2  3.5 - 5.1 mEq/L   Chloride 78 (*) 96 - 112 mEq/L   CO2 25  19 - 32 mEq/L   Glucose, Bld 97  70 - 99 mg/dL   BUN 5 (*) 6 - 23 mg/dL   Creatinine, Ser 7.82  0.50 - 1.35 mg/dL   Calcium 9.4  8.4 - 95.6 mg/dL   Total Protein 7.5  6.0 - 8.3 g/dL   Albumin 4.2  3.5 - 5.2 g/dL   AST 33  0 - 37 U/L   ALT 25  0 - 53 U/L   Alkaline Phosphatase 42  39 - 117 U/L   Total Bilirubin 0.9  0.3 - 1.2 mg/dL   GFR calc non Af Amer >90  >90 mL/min   GFR calc Af Amer >90  >90 mL/min  URINE RAPID DRUG SCREEN (HOSP PERFORMED)      Result Value Range   Opiates NONE DETECTED  NONE DETECTED   Cocaine NONE DETECTED  NONE DETECTED   Benzodiazepines NONE DETECTED  NONE DETECTED   Amphetamines NONE DETECTED  NONE DETECTED   Tetrahydrocannabinol NONE DETECTED  NONE DETECTED   Barbiturates NONE DETECTED  NONE DETECTED  ETHANOL      Result Value Range   Alcohol, Ethyl (B) <11  0 - 11 mg/dL  CK      Result Value Range   Total CK 141  7 - 232 U/L   BASIC METABOLIC PANEL      Result Value Range   Sodium 117 (*) 135 - 145 mEq/L   Potassium 3.7  3.5 - 5.1 mEq/L   Chloride 82 (*) 96 - 112 mEq/L   CO2 25  19 - 32 mEq/L   Glucose, Bld 92  70 - 99 mg/dL   BUN 5 (*) 6 - 23 mg/dL   Creatinine, Ser 2.13  0.50 - 1.35 mg/dL   Calcium 8.5  8.4 - 08.6 mg/dL   GFR calc non Af Amer >90  >90 mL/min   GFR calc Af Amer >  90  >90 mL/min    Na continues low. Pt mentating per baseline. T/C to [facility] Dr. Waymon Amato, case discussed, including:  HPI, pertinent PM/SHx, VS/PE, dx testing, ED course and treatment:  Agreeable to admit, requests to write temporary orders, obtain regular bed to team 2.    Laray Anger, DO 05/02/13 [phone]

## 2013-05-02 NOTE — ED Notes (Signed)
Report given to [place], Charity fundraiser. Ready to receive patient.

## 2013-05-02 NOTE — H&P (Signed)
[facility] History and Physical  CLIFTON SAFLEY ZOX:[id number] DOB: September 18, 1966 DOA: 05/02/2013  Referring physician: EDP PCP: No primary provider on file.  Specialists:  None.  Chief Complaint: Leg pains, nausea & vomiting.  HPI: [name] is a 47 y.o. male with history of hepatitis C cirrhosis, chronic leg pain attributed to myositis of unclear etiology, polysubstance abuse-alcohol/tobacco/cocaine in the past, rhabdomyolysis associated acute renal failure on temporary HD presented to the ED with worsening bilateral lower extremity pain, intractable nausea, vomiting and some diarrhea. Patient states that he's had bilateral leg pain waist down for 5-6 years of unclear etiology. He's not on any specific followup or treatment for same. 3 days ago he noticed worsening pain that is constant, aching, nonradiating with no specific aggravating or relieving factors. He also complains of 1 day history of intractable nausea, vomiting and diarrhea. He denies abdominal pain, fever, chills, sickly contacts or recent antibiotic exposure. No coffee ground or bloody emesis. Stools yellow in color without mucus or blood. In the ED, sodium initially 114-repeat 117. Patient states that he's been drinking a lot of bottled water (1 case). Hospitalist admission requested   Review of Systems: All systems reviewed and apart from history of presenting illness, are negative  Past Medical History  Diagnosis Date  . Hepatitis C   . Myositis     Left deltoid biopsy at [facility] 10/11/11.  Marland Kitchen Cirrhosis   . Chronic pain syndrome   . 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 Alcohol abuse     Psychiatric admissions for alcohol and drug abuse  . History of cocaine abuse   . Depression     Multiple psychiatric admissions  . Tobacco abuse   . Tattoos   . Low TSH level January 2013    Normal free T4 of 1.06  . Back pain, chronic   . Acute renal failure     Secondary to rhabdo. Treated with dialysis short-term.   Past Surgical  History  Procedure Laterality Date  . Hernia repair    . Muscle biopsy    . Appendectomy    . Cardiac catheterization  June 2006    Normal coronary arteries   Social History:  reports that he has been smoking Cigarettes.  He has a 40 pack-year smoking history. He uses smokeless tobacco. He reports that  drinks alcohol. He reports that he uses illicit drugs (Cocaine and Marijuana). Married but lives alone with his mother. Patient has wheelchair and other DME at home and says that he ambulates minimally. Claims that he drank only 2 beers over the last week. Denies substance abuse.  Allergies  Allergen Reactions  . Acetaminophen Other (See Comments)    Liver disease     No family history on file. negative history  Prior to Admission medications   Medication Sig Start Date End Date Taking? Authorizing Provider  naproxen sodium (ALEVE) 220 MG tablet Take 220 mg by mouth 2 (two) times daily as needed (Pain).   Yes Historical Provider, MD   Physical Exam: Filed Vitals:   05/02/13 1313  BP: 164/101  Pulse: 99  Temp: 98.3 F (36.8 C)  TempSrc: Oral  Resp: 24  Height: 5\' 7"  (1.702 m)  Weight: 65.772 kg (145 lb)  SpO2: 100%     General exam: Moderately built and nourished patient, lying comfortably supine on the gurney in no obvious distress.  Head, eyes and ENT: Nontraumatic and normocephalic. Pupils equally reacting to light and accommodation. Oral mucosa dry.  Neck: Supple.  No JVD, carotid bruit or thyromegaly.  Lymphatics: No lymphadenopathy.  Respiratory system: Clear to auscultation. No increased work of breathing.  Cardiovascular system: S1 and S2 heard, RRR. No JVD, murmurs, gallops, clicks or pedal edema.  Gastrointestinal system: Abdomen is nondistended, soft and nontender. Normal bowel sounds heard. No organomegaly or masses appreciated.  Central nervous system: Alert and oriented. No focal neurological deficits.  Extremities: Symmetric 5 x 5 power. Peripheral  pulses symmetrically felt.  Skin: No rashes or acute findings.  Musculoskeletal system: Negative exam.  Psychiatry: Pleasant and cooperative.   Labs on Admission:  Basic Metabolic Panel:  Recent Labs Lab 05/02/13 1358 05/02/13 1539  NA 114* 117*  K 4.2 3.7  CL 78* 82*  CO2 25 25  GLUCOSE 97 92  BUN 5* 5*  CREATININE 0.58 0.59  CALCIUM 9.4 8.5   Liver Function Tests:  Recent Labs Lab 05/02/13 1358  AST 33  ALT 25  ALKPHOS 42  BILITOT 0.9  PROT 7.5  ALBUMIN 4.2   No results found for this basename: LIPASE, AMYLASE,  in the last 168 hours No results found for this basename: AMMONIA,  in the last 168 hours CBC:  Recent Labs Lab 05/02/13 1358  WBC 9.0  NEUTROABS 5.6  HGB 16.0  HCT 42.2  MCV 81.9  PLT 182   Cardiac Enzymes:  Recent Labs Lab 05/02/13 1358  CKTOTAL 141    BNP (last 3 results) No results found for this basename: PROBNP,  in the last 8760 hours CBG: No results found for this basename: GLUCAP,  in the last 168 hours  Radiological Exams on Admission: No results found.    Assessment/Plan Active Problems:   HEPATITIS C   TOBACCO ABUSE   MYOSITIS   Dehydration   Alcohol abuse   Chronic pain syndrome   Hyponatremia   1. Hyponatremia: Probably multifactorial-dehydration, beer potomania, nausea vomiting diarrhea, free water consumption. Rule out SIADH. Admit to medical floor. Check urine and serum osmolarity and urine lytes. Continue IV normal saline and check frequent BMPs. Aim to correct Na by 10 Meq/24 hours. No mental status changes 2. Nausea, vomiting and diarrhea: DD-acute viral GE, gastritis, esophagitis, PUD. IV PPI. Check C. difficile PCR. Clear liquid diet and advance diet as tolerated.  3. Polysubstance abuse-alcohol/tobacco and cocaine. Unable to get a clear picture as to exact amount of alcohol intake. Place on CIWA protocol. Tobacco cessation counseled. 4. Chronic pain: No acute findings in lower extremities. When  necessary tramadol. CK normal. 5. History of hepatitis C: Not on treatment. 6. Elevated blood pressures:? Related to pain. Monitor off medications for now.  Code Status: Full  Family Communication: None  Disposition Plan: Discharge home when medically stable.   Time spent: 45 minutes  [facility] Pager [phone]  If 7PM-7AM, please contact night-coverage [email or web address] Password [email or web address] 05/02/2013, 5:10 PM

## 2013-05-02 NOTE — ED Notes (Signed)
Admitting MD at bedside.

## 2013-05-02 NOTE — ED Provider Notes (Addendum)
CSN: [id number]     Arrival date & time 05/02/13  1311 History     First MD Initiated Contact with Patient 05/02/13 1355     Chief Complaint  Patient presents with  . Leg Pain   (Consider location/radiation/quality/duration/timing/severity/associated sxs/prior Treatment) Patient is a 47 y.o. male presenting with leg pain. The history is provided by the patient.  Leg Pain Associated symptoms: no back pain, no fever and no neck pain    patient with a history of myositis. Patient's also had rhabdomyolysis in the past the most recent admission was in December. Patient states he has sort of chronic bilateral leg pain but worse in the last several weeks in sniffily worse in the last 3 days. Patient doesn't have any pain medication at home he does have a history of alcohol abuse states that he frequently drinks heavy does deal with the pain. Patient states his urine has not been dark. Patient denies any abdominal pain or chest pain or shortness of breath.  Past Medical History  Diagnosis Date  . Hepatitis C   . Myositis     Left deltoid biopsy at [facility] 10/11/11.  Marland Kitchen Cirrhosis   . Chronic pain syndrome   . 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 Alcohol abuse     Psychiatric admissions for alcohol and drug abuse  . History of cocaine abuse   . Depression     Multiple psychiatric admissions  . Tobacco abuse   . Tattoos   . Low TSH level January 2013    Normal free T4 of 1.06  . Back pain, chronic   . Acute renal failure     Secondary to rhabdo. Treated with dialysis short-term.   Past Surgical History  Procedure Laterality Date  . Hernia repair    . Muscle biopsy    . Appendectomy    . Cardiac catheterization  June 2006    Normal coronary arteries   No family history on file. History  Substance Use Topics  . Smoking status: Current Every Day Smoker -- 1.00 packs/day for 40 years    Types: Cigarettes  . Smokeless tobacco: Current User  . Alcohol Use: 0.0 oz/week    Review of Systems   Constitutional: Negative for fever.  HENT: Negative for neck pain.   Eyes: Negative for visual disturbance.  Respiratory: Negative for shortness of breath.   Cardiovascular: Negative for chest pain and leg swelling.  Gastrointestinal: Negative for nausea, vomiting, abdominal pain and diarrhea.  Genitourinary: Negative for dysuria and hematuria.  Musculoskeletal: Negative for back pain.  Skin: Negative for rash.  Neurological: Negative for weakness and headaches.  Hematological: Does not bruise/bleed easily.  Psychiatric/Behavioral: Negative for confusion.    Allergies  Acetaminophen  Home Medications   Current Outpatient Rx  Name  Route  Sig  Dispense  Refill  . naproxen sodium (ALEVE) 220 MG tablet   Oral   Take 220 mg by mouth 2 (two) times daily as needed (Pain).          BP 164/101  Pulse 99  Temp(Src) 98.3 F (36.8 C) (Oral)  Resp 24  Ht 5\' 7"  (1.702 m)  Wt 145 lb (65.772 kg)  BMI 22.71 kg/m2  SpO2 100% Physical Exam  Nursing note and vitals reviewed. Constitutional: He is oriented to person, place, and time. He appears well-developed and well-nourished. No distress.  HENT:  Head: Normocephalic and atraumatic.  Mouth/Throat: Oropharynx is clear and moist.  Eyes: Conjunctivae and EOM are normal. Pupils are equal,  round, and reactive to light.  Neck: Normal range of motion. Neck supple.  Cardiovascular: Normal rate, regular rhythm and normal heart sounds.   No murmur heard. Pulmonary/Chest: Effort normal and breath sounds normal. No respiratory distress.  Abdominal: Soft. Bowel sounds are normal. There is no tenderness.  Neurological: He is alert and oriented to person, place, and time. No cranial nerve deficit. He exhibits normal muscle tone. Coordination normal.  Skin: Skin is warm. No rash noted.    ED Course   Procedures (including critical care time)  Labs Reviewed  CBC WITH DIFFERENTIAL - Abnormal; Notable for the following:    MCHC 37.9 (*)     All other components within normal limits  COMPREHENSIVE METABOLIC PANEL - Abnormal; Notable for the following:    Sodium 114 (*)    Chloride 78 (*)    BUN 5 (*)    All other components within normal limits  ETHANOL  CK  URINE RAPID DRUG SCREEN (HOSP PERFORMED)   Results for orders placed during the hospital encounter of 05/02/13  CBC WITH DIFFERENTIAL      Result Value Range   WBC 9.0  4.0 - 10.5 K/uL   RBC 5.15  4.22 - 5.81 MIL/uL   Hemoglobin 16.0  13.0 - 17.0 g/dL   HCT 10.9  60.4 - 54.0 %   MCV 81.9  78.0 - 100.0 fL   MCH 31.1  26.0 - 34.0 pg   MCHC 37.9 (*) 30.0 - 36.0 g/dL   RDW 98.1  19.1 - 47.8 %   Platelets 182  150 - 400 K/uL   Neutrophils Relative % PENDING  43 - 77 %   Neutro Abs PENDING  1.7 - 7.7 K/uL   Band Neutrophils PENDING  0 - 10 %   Lymphocytes Relative PENDING  12 - 46 %   Lymphs Abs PENDING  0.7 - 4.0 K/uL   Monocytes Relative PENDING  3 - 12 %   Monocytes Absolute PENDING  0.1 - 1.0 K/uL   Eosinophils Relative PENDING  0 - 5 %   Eosinophils Absolute PENDING  0.0 - 0.7 K/uL   Basophils Relative PENDING  0 - 1 %   Basophils Absolute PENDING  0.0 - 0.1 K/uL   WBC Morphology PENDING     RBC Morphology PENDING     Smear Review PENDING     nRBC PENDING  0 /100 WBC   Metamyelocytes Relative PENDING     Myelocytes PENDING     Promyelocytes Absolute PENDING     Blasts PENDING    COMPREHENSIVE METABOLIC PANEL      Result Value Range   Sodium 114 (*) 135 - 145 mEq/L   Potassium 4.2  3.5 - 5.1 mEq/L   Chloride 78 (*) 96 - 112 mEq/L   CO2 25  19 - 32 mEq/L   Glucose, Bld 97  70 - 99 mg/dL   BUN 5 (*) 6 - 23 mg/dL   Creatinine, Ser 2.95  0.50 - 1.35 mg/dL   Calcium 9.4  8.4 - 62.1 mg/dL   Total Protein 7.5  6.0 - 8.3 g/dL   Albumin 4.2  3.5 - 5.2 g/dL   AST 33  0 - 37 U/L   ALT 25  0 - 53 U/L   Alkaline Phosphatase 42  39 - 117 U/L   Total Bilirubin 0.9  0.3 - 1.2 mg/dL   GFR calc non Af Amer >90  >90 mL/min   GFR calc Af Amer >  90  >90 mL/min   ETHANOL      Result Value Range   Alcohol, Ethyl (B) <11  0 - 11 mg/dL  CK      Result Value Range   Total CK 141  7 - 232 U/L      No results found. 1. Polymyositis   2. Hyponatremia     MDM   Patient without any evidence of rhabdomyolysis today. CPK is not elevated however he does have significant hypo-daydream he this will require admission. Will contact the hospitalist for admission.   Patient is mentating fine. Has a history of myositis. Has had pain in both lower trimming is now for a number of days but has been worse for the past 3 days. Patient has had rhabdomyolysis in the past but no evidence of that today.   In discussion with the hospitalist. They are suspicious that perhaps the lab made an error with sodium and chloride since his mental status is very normal I do not disagree however both sodium and chloride are both down. They're recommending that we repeat the lab is still low then recontact them. Clearly if it is a lab error and his sodium and chloride is normal the patient can be discharged home. Patient returned over to Dr. Donnetta Hutching who will assess the lab and recontact hospitalist as appropriate.  Shelda Jakes, MD 05/02/13 1610  Shelda Jakes, MD 05/02/13 [phone]

## 2013-05-03 DIAGNOSIS — M3320 Polymyositis, organ involvement unspecified: Secondary | ICD-10-CM

## 2013-05-03 LAB — BASIC METABOLIC PANEL
BUN: 5 mg/dL — ABNORMAL LOW (ref 6–23)
CO2: 27 mEq/L (ref 19–32)
Calcium: 8.6 mg/dL (ref 8.4–10.5)
Chloride: 88 mEq/L — ABNORMAL LOW (ref 96–112)
GFR calc non Af Amer: >90 mL/min (ref >90)
Glucose, Bld: 112 mg/dL — ABNORMAL HIGH (ref 70–99)
Glucose, Bld: 100 mg/dL — ABNORMAL HIGH (ref 70–99)
Potassium: 3.8 mEq/L (ref 3.5–5.1)
Sodium: 122 mEq/L — ABNORMAL LOW (ref 135–145)

## 2013-05-03 LAB — OSMOLALITY: Osmolality: 238 mOsm/kg — ABNORMAL LOW (ref 275–300)

## 2013-05-03 NOTE — Progress Notes (Addendum)
[facility] PROGRESS NOTE  SINAI MAHANY ZOX:[id number] DOB: [birth year] DOA: 05/02/2013 PCP: No primary provider on file.  Assessment/Plan: Active Problems:   HEPATITIS C   TOBACCO ABUSE   MYOSITIS   Dehydration   Alcohol abuse   Chronic pain syndrome   Hyponatremia   Polysubstance abuse   Nausea, vomiting and diarrhea     1. Hyponatremia: Probably multifactorial-dehydration, beer potomania,polydipsia , nausea vomiting diarrhea, free water consumption. Rule out SIADH. Admit to medical floor. Check urine and serum osmolarity and urine lytes. Continue IV normal saline and check frequent BMPs. Aim to correct Na by 10 Meq/24 hours. No mental status changes.Fluid restriction to 1200 cc 2. Nausea, vomiting and diarrhea: DD-acute viral GE, gastritis, esophagitis, PUD. IV PPI. Check C. difficile PCR.  advance diet as tolerated.  3. Polysubstance abuse-alcohol/tobacco and cocaine. Unable to get a clear picture as to exact amount of alcohol intake. Place on CIWA protocol. Tobacco cessation counseled.Check magnesium 4. Chronic pain: No acute findings in lower extremities. When necessary tramadol. CK normal. 5. History of hepatitis C: Not on treatment. 6. Elevated blood pressures:? Related to pain. Monitor off medications for now. 7.   Code Status: full Family Communication: family updated about patient's clinical progress Disposition Plan:  As above    Brief narrative: [name] is a 47 y.o. male with history of hepatitis C cirrhosis, chronic leg pain attributed to myositis of unclear etiology, polysubstance abuse-alcohol/tobacco/cocaine in the past, rhabdomyolysis associated acute renal failure on temporary HD presented to the ED with worsening bilateral lower extremity pain, intractable nausea, vomiting and some diarrhea. Patient states that he's had bilateral leg pain waist down for 5-6 years of unclear etiology. He's not on any specific followup or treatment for same. 3 days ago he  noticed worsening pain that is constant, aching, nonradiating with no specific aggravating or relieving factors. He also complains of 1 day history of intractable nausea, vomiting and diarrhea. He denies abdominal pain, fever, chills, sickly contacts or recent antibiotic exposure. No coffee ground or bloody emesis. Stools yellow in color without mucus or blood. In the ED, sodium initially 114-repeat 117. Patient states that he's been drinking a lot of bottled water (1 case). Hospitalist admission requested   Consultants:     Procedures:     Antibiotics:     HPI/Subjective: Nausea , vomitting improved , no diarrhea   Objective: Filed Vitals:   05/02/13 1800 05/02/13 2254 05/03/13 0241 05/03/13 0549  BP: 138/88 120/76 112/76 129/79  Pulse: 80 76 73 71  Temp: 98.2 F (36.8 C) 97.4 F (36.3 C) 98.1 F (36.7 C) 97.8 F (36.6 C)  TempSrc: Oral Oral Oral Axillary  Resp: 18 20 20 20   Height:      Weight:      SpO2: 95% 99% 100% 99%    Intake/Output Summary (Last 24 hours) at 05/03/13 1055 Last data filed at 05/03/13 0600  Gross per 24 hour  Intake 2788.33 ml  Output   1800 ml  Net 988.33 ml    Exam: General exam: Moderately built and nourished patient, lying comfortably supine on the gurney in no obvious distress. Head, eyes and ENT: Nontraumatic and normocephalic. Pupils equally reacting to light and accommodation. Oral mucosa dry. Neck: Supple. No JVD, carotid bruit or thyromegaly. Lymphatics: No lymphadenopathy. Respiratory system: Clear to auscultation. No increased work of breathing. Cardiovascular system: S1 and S2 heard, RRR. No JVD, murmurs, gallops, clicks or pedal edema. Gastrointestinal system: Abdomen is nondistended, soft and nontender.  Normal bowel sounds heard. No organomegaly or masses appreciated. Central nervous system: Alert and oriented. No focal neurological deficits. Extremities: Symmetric 5 x 5 power. Peripheral pulses symmetrically felt. Skin: No rashes or  acute findings. Musculoskeletal system: Negative exam. Psychiatry: Pleasant and cooperative    Data Reviewed: Basic Metabolic Panel:  Recent Labs Lab 05/02/13 1358 05/02/13 1539 05/02/13 2159 05/03/13 0643 05/03/13 1003  NA 114* 117* 121* 122* 122*  K 4.2 3.7 3.7 4.1 3.8  CL 78* 82* 86* 88* 88*  CO2 25 25 29 27 27   GLUCOSE 97 92 79 112* 100*  BUN 5* 5* 7 6 5*  CREATININE 0.58 0.59 0.66 0.57 0.57  CALCIUM 9.4 8.5 8.6 8.6 8.4    Liver Function Tests:  Recent Labs Lab 05/02/13 1358  AST 33  ALT 25  ALKPHOS 42  BILITOT 0.9  PROT 7.5  ALBUMIN 4.2   No results found for this basename: LIPASE, AMYLASE,  in the last 168 hours No results found for this basename: AMMONIA,  in the last 168 hours  CBC:  Recent Labs Lab 05/02/13 1358  WBC 9.0  NEUTROABS 5.6  HGB 16.0  HCT 42.2  MCV 81.9  PLT 182    Cardiac Enzymes:  Recent Labs Lab 05/02/13 1358  CKTOTAL 141   BNP (last 3 results) No results found for this basename: PROBNP,  in the last 8760 hours   CBG: No results found for this basename: GLUCAP,  in the last 168 hours  No results found for this or any previous visit (from the past 240 hour(s)).   Studies: No results found.  Scheduled Meds: . enoxaparin (LOVENOX) injection  40 mg Subcutaneous Q24H  . folic acid  1 mg Oral Daily  . multivitamin with minerals  1 tablet Oral Daily  . pantoprazole (PROTONIX) IV  40 mg Intravenous Q24H  . thiamine  100 mg Oral Daily   Or  . thiamine  100 mg Intravenous Daily   Continuous Infusions: . sodium chloride 100 mL/hr at 05/02/13 2223    Active Problems:   HEPATITIS C   TOBACCO ABUSE   MYOSITIS   Dehydration   Alcohol abuse   Chronic pain syndrome   Hyponatremia   Polysubstance abuse   Nausea, vomiting and diarrhea    Time spent: 40 minutes   [facility]  [facility] Pager [phone]. If 8PM-8AM, please contact night-coverage at [email or web address], password [facility] 05/03/2013, 10:55 AM  LOS: 1  day

## 2013-05-03 NOTE — Progress Notes (Signed)
Nutrition Brief Note  Patient identified on the Malnutrition Screening Tool (MST) Report, with a score of 2.   Wt Readings from Last 15 Encounters:  05/02/13 145 lb (65.772 kg)  03/17/13 130 lb (58.968 kg)  09/12/12 141 lb 3.2 oz (64.048 kg)  09/08/12 149 lb 7.6 oz (67.8 kg)  06/11/12 145 lb (65.772 kg)  06/09/12 140 lb (63.504 kg)  06/08/12 143 lb 8.3 oz (65.1 kg)  04/28/12 134 lb (60.782 kg)  04/27/12 134 lb (60.782 kg)  04/24/12 134 lb 2 oz (60.839 kg)  12/24/11 140 lb (63.504 kg)  12/12/11 139 lb 15.9 oz (63.5 kg)  10/30/11 140 lb (63.504 kg)  10/19/11 134 lb 0.6 oz (60.8 kg)  06/17/10 143 lb 8 oz (65.091 kg)    Body mass index is 22.71 kg/(m^2). Patient meets criteria for normal weight based on current BMI.   Current diet order is regular, patient is consuming approximately 100% of meals at this time. Labs and medications reviewed.   No nutrition interventions warranted at this time. If nutrition issues arise, please consult RD.   Milana Salay A. Mayford Knife, RD, LDN Pager: [phone]

## 2013-05-04 ENCOUNTER — Encounter (HOSPITAL_COMMUNITY): Payer: Self-pay | Admitting: Emergency Medicine

## 2013-05-04 ENCOUNTER — Emergency Department (HOSPITAL_COMMUNITY)
Admission: EM | Admit: 2013-05-04 | Discharge: 2013-05-04 | Disposition: A | Discharge status: Home / Self Care | Payer: Medicare Other | Attending: Internal Medicine | Admitting: Internal Medicine

## 2013-05-04 DIAGNOSIS — E876 Hypokalemia: Secondary | ICD-10-CM | POA: Insufficient documentation

## 2013-05-04 DIAGNOSIS — M79609 Pain in unspecified limb: Secondary | ICD-10-CM | POA: Insufficient documentation

## 2013-05-04 DIAGNOSIS — Z8719 Personal history of other diseases of the digestive system: Secondary | ICD-10-CM | POA: Insufficient documentation

## 2013-05-04 DIAGNOSIS — Z8619 Personal history of other infectious and parasitic diseases: Secondary | ICD-10-CM | POA: Insufficient documentation

## 2013-05-04 DIAGNOSIS — F17200 Nicotine dependence, unspecified, uncomplicated: Secondary | ICD-10-CM | POA: Insufficient documentation

## 2013-05-04 DIAGNOSIS — G894 Chronic pain syndrome: Secondary | ICD-10-CM | POA: Insufficient documentation

## 2013-05-04 DIAGNOSIS — E871 Hypo-osmolality and hyponatremia: Secondary | ICD-10-CM | POA: Diagnosis not present

## 2013-05-04 DIAGNOSIS — R42 Dizziness and giddiness: Secondary | ICD-10-CM | POA: Diagnosis not present

## 2013-05-04 DIAGNOSIS — G8929 Other chronic pain: Secondary | ICD-10-CM | POA: Diagnosis not present

## 2013-05-04 DIAGNOSIS — Z8739 Personal history of other diseases of the musculoskeletal system and connective tissue: Secondary | ICD-10-CM | POA: Insufficient documentation

## 2013-05-04 DIAGNOSIS — Z872 Personal history of diseases of the skin and subcutaneous tissue: Secondary | ICD-10-CM | POA: Insufficient documentation

## 2013-05-04 DIAGNOSIS — F1410 Cocaine abuse, uncomplicated: Secondary | ICD-10-CM | POA: Insufficient documentation

## 2013-05-04 DIAGNOSIS — Z87448 Personal history of other diseases of urinary system: Secondary | ICD-10-CM | POA: Insufficient documentation

## 2013-05-04 DIAGNOSIS — M549 Dorsalgia, unspecified: Secondary | ICD-10-CM | POA: Insufficient documentation

## 2013-05-04 DIAGNOSIS — F1010 Alcohol abuse, uncomplicated: Secondary | ICD-10-CM

## 2013-05-04 DIAGNOSIS — R52 Pain, unspecified: Secondary | ICD-10-CM | POA: Insufficient documentation

## 2013-05-04 DIAGNOSIS — M3320 Polymyositis, organ involvement unspecified: Secondary | ICD-10-CM

## 2013-05-04 DIAGNOSIS — Z8659 Personal history of other mental and behavioral disorders: Secondary | ICD-10-CM | POA: Insufficient documentation

## 2013-05-04 LAB — BASIC METABOLIC PANEL
CO2: 26 mEq/L (ref 19–32)
Calcium: 9.3 mg/dL (ref 8.4–10.5)
Chloride: 94 mEq/L — ABNORMAL LOW (ref 96–112)
Chloride: 95 mEq/L — ABNORMAL LOW (ref 96–112)
GFR calc Af Amer: >90 mL/min (ref >90)
GFR calc non Af Amer: >90 mL/min (ref >90)
Glucose, Bld: 110 mg/dL — ABNORMAL HIGH (ref 70–99)
Potassium: 3.7 mEq/L (ref 3.5–5.1)
Potassium: 4.5 mEq/L (ref 3.5–5.1)
Sodium: 129 mEq/L — ABNORMAL LOW (ref 135–145)
Sodium: 131 mEq/L — ABNORMAL LOW (ref 135–145)

## 2013-05-04 LAB — CBC WITH DIFFERENTIAL/PLATELET
Basophils Absolute: 0 10*3/uL (ref 0.0–0.1)
Lymphocytes Relative: 22 % (ref 12–46)
Lymphs Abs: 1.6 10*3/uL (ref 0.7–4.0)
Neutro Abs: 4.5 10*3/uL (ref 1.7–7.7)
Neutrophils Relative %: 62 % (ref 43–77)
Platelets: 183 10*3/uL (ref 150–400)
RBC: 4.22 MIL/uL (ref 4.22–5.81)
RDW: 12.5 % (ref 11.5–15.5)
WBC: 7.2 10*3/uL (ref 4.0–10.5)

## 2013-05-04 LAB — RAPID URINE DRUG SCREEN, HOSP PERFORMED
Amphetamines: NOT DETECTED
Cocaine: NOT DETECTED
Opiates: NOT DETECTED

## 2013-05-04 LAB — ETHANOL: Alcohol, Ethyl (B): <11 mg/dL (ref 0–11)

## 2013-05-04 LAB — SEDIMENTATION RATE: Sed Rate: 5 mm/hr (ref 0–16)

## 2013-05-04 MED ORDER — SODIUM CHLORIDE 0.9 % IV BOLUS (SEPSIS)
500.0000 mL | Freq: Once | INTRAVENOUS | Status: AC
  Administered 2013-05-04: 500 mL via INTRAVENOUS

## 2013-05-04 NOTE — Discharge Summary (Addendum)
Physician Discharge Summary  [name] MRN: [id number] DOB/AGE: [id number] 46 y.o.  PCP: No primary provider on file.   Admit date: 05/04/2013 Discharge date: 05/04/2013  Discharge Diagnoses:  Hyponatremia Alcohol dependence Left AGAINST MEDICAL ADVICE     Medication List         ALEVE 220 MG tablet  Generic drug:  naproxen sodium  Take 220 mg by mouth 2 (two) times daily as needed (Pain).        Discharge Condition: *  Disposition: 07-Left Against Medical Advice   Consults:    Significant Diagnostic Studies: No results found.    Microbiology: No results found for this or any previous visit (from the past 240 hour(s)).   Labs: Results for orders placed during the hospital encounter of 05/04/13 (from the past 48 hour(s))  CBC WITH DIFFERENTIAL     Status: Abnormal   Collection Time    05/04/13  2:34 PM      Result Value Range   WBC 7.2  4.0 - 10.5 K/uL   RBC 4.22  4.22 - 5.81 MIL/uL   Hemoglobin 13.1  13.0 - 17.0 g/dL   HCT 19.1 (*) 47.8 - 29.5 %   MCV 84.4  78.0 - 100.0 fL   MCH 31.0  26.0 - 34.0 pg   MCHC 36.8 (*) 30.0 - 36.0 g/dL   RDW 62.1  30.8 - 65.7 %   Platelets 183  150 - 400 K/uL   Neutrophils Relative % 62  43 - 77 %   Neutro Abs 4.5  1.7 - 7.7 K/uL   Lymphocytes Relative 22  12 - 46 %   Lymphs Abs 1.6  0.7 - 4.0 K/uL   Monocytes Relative 15 (*) 3 - 12 %   Monocytes Absolute 1.1 (*) 0.1 - 1.0 K/uL   Eosinophils Relative 1  0 - 5 %   Eosinophils Absolute 0.0  0.0 - 0.7 K/uL   Basophils Relative 0  0 - 1 %   Basophils Absolute 0.0  0.0 - 0.1 K/uL  BASIC METABOLIC PANEL     Status: Abnormal   Collection Time    05/04/13  2:34 PM      Result Value Range   Sodium 129 (*) 135 - 145 mEq/L   Comment: DELTA CHECK NOTED   Potassium 3.7  3.5 - 5.1 mEq/L   Chloride 94 (*) 96 - 112 mEq/L   CO2 26  19 - 32 mEq/L   Glucose, Bld 134 (*) 70 - 99 mg/dL   BUN 6  6 - 23 mg/dL   Creatinine, Ser 8.46  0.50 - 1.35 mg/dL   Calcium 9.3  8.4 -  96.2 mg/dL   GFR calc non Af Amer >90  >90 mL/min   GFR calc Af Amer >90  >90 mL/min   Comment: (NOTE)     The eGFR has been calculated using the CKD EPI equation.     This calculation has not been validated in all clinical situations.     eGFR's persistently <90 mL/min signify possible Chronic Kidney     Disease.  ETHANOL     Status: None   Collection Time    05/04/13  2:34 PM      Result Value Range   Alcohol, Ethyl (B) <11  0 - 11 mg/dL   Comment:            LOWEST DETECTABLE LIMIT FOR     SERUM ALCOHOL IS 11 mg/dL  FOR MEDICAL PURPOSES ONLY  CK     Status: None   Collection Time    05/04/13  2:34 PM      Result Value Range   Total CK 191  7 - 232 U/L     HPI : [name] is a 47 y.o. male with history of hepatitis C cirrhosis, chronic leg pain attributed to myositis of unclear etiology, polysubstance abuse-alcohol/tobacco/cocaine in the past, rhabdomyolysis associated acute renal failure on temporary HD presented to the ED with worsening bilateral lower extremity pain, intractable nausea, vomiting and some diarrhea. Patient states that he's had bilateral leg pain waist down for 5-6 years of unclear etiology. He's not on any specific followup or treatment for same. 3 days ago he noticed worsening pain that is constant, aching, nonradiating with no specific aggravating or relieving factors. He also complains of 1 day history of intractable nausea, vomiting and diarrhea. He denies abdominal pain, fever, chills, sickly contacts or recent antibiotic exposure. No coffee ground or bloody emesis. Stools yellow in color without mucus or blood. In the ED, sodium initially 114-repeat 117. Patient states that he's been drinking a lot of bottled water (1 case). Hospitalist admission requested  HOSPITAL COURSE:  1. Hyponatremia: Probably multifactorial-dehydration, beer potomania,polydipsia , nausea vomiting diarrhea, free water consumption. Patient admitted to drinking excessively. He was  admitted to the medical floor. Hydrated with IV fluids. Sodium improved from 114-129 and a period of 48 hours. Patient left AGAINST MEDICAL ADVICE in the middle of the night. Presented back to the ER for pain in his legs and more Percocet prescriptions.. Discussed with EDp. At this point the patient will be hydrated in the ER, will check orthostatics, patient counseled about alcohol cessation, patient has been without alcohol for more than 3-4 days. Doubt that he will start withdrawing at this point. He will be hydrated with IV fluids. We'll recheck her sodium this afternoon in stable and the patient will be discharged home today from the ED,he does not need to be readmitted. 2. Nausea, vomiting and diarrhea: DD-acute viral GE, gastritis, esophagitis, PUD. Start her on Protonix, symptoms resolved.. C. difficile PCR negative. Patient recommended to hydrate himself with Gatorade, juices. And not drink free water. 3. Polysubstance abuse-alcohol/tobacco and cocaine. Unable to get a clear picture as to exact amount of alcohol intake. No signs of withdrawal during this hospitalization. Tobacco cessation counseled.Check magnesium 4. Chronic pain:patient states he has had b/l LE pain for several years , he was DC from Dr Juanetta Gosling practice due to noncompliance.  No narcotics will be prescribed.  Patient recommended to take his Naprosyn. 5. History of hepatitis C: Not on treatment. 6.    Discharge Exam: *  Blood pressure 142/87, pulse 92, temperature 98.8 F (37.1 C), temperature source Oral, resp. rate 20, height 5\' 8"  (1.727 m), weight 65.772 kg (145 lb), SpO2 99.00%.   Physical examination: Nursing notes reviewed; Vital signs and O2 SAT reviewed; Constitutional: Well developed, Well nourished, Well hydrated, In no acute distress; Head: Normocephalic, atraumatic; Eyes: EOMI, PERRL, No scleral icterus; ENMT: Mouth and pharynx normal, Mucous membranes moist; Neck: Supple, Full range of motion, No lymphadenopathy;  Cardiovascular: Regular rate and rhythm, No gallop; Respiratory: Breath sounds clear & equal bilaterally, No rales, rhonchi, wheezes. Speaking full sentences with ease, Normal respiratory effort/excursion; Chest: Nontender, Movement normal; Abdomen: Soft, Nontender, Nondistended, Normal bowel sounds; Genitourinary: No CVA tenderness; Extremities: Pulses normal, No tenderness, No edema, No calf edema or asymmetry.; Neuro: AA&Ox3, Major CN grossly  intact. Speech clear. No gross focal motor or sensory deficits in extremities.; Skin: Color normal, Warm, Dry.       SignedRicharda Overlie 05/04/2013, 3:31 PM

## 2013-05-04 NOTE — ED Notes (Addendum)
Pt dx with myocitis. Pt here yesterday for pain to legs "and anywhere there is a muscle".pt was admitted yesterday for hyponatremia.  Pt c/o dizziness started today. Pt alert/oriented. Denies cp/sob/gu sx's. Denies n/v/d. States he left this am AMA. Stated he dont know why he left "probably because I wanted to smoke a cigarette.

## 2013-05-04 NOTE — ED Notes (Signed)
Patient with no complaints at this time. Respirations even and unlabored. Skin warm/dry. Discharge instructions reviewed with patient at this time. Patient given opportunity to voice concerns/ask questions. IV removed per policy and band-aid applied to site. Patient discharged at this time and left Emergency Department with steady gait.  

## 2013-05-04 NOTE — ED Provider Notes (Signed)
CSN: [id number]     Arrival date & time 05/04/13  1413 History     First MD Initiated Contact with Patient 05/04/13 1421     Chief Complaint  Patient presents with  . Dizziness  . Leg Pain    HPI Pt was seen at 1425. Per pt, c/o gradual onset and persistence of constant generalized "muscle aches all over" for the past several days. Pt was admitted to the hospital for hyponatremia (Na 114) 2 days ago. Pt states he left the hospital AMA this morning "because I wanted to go smoke a cigarette." States he has come back to the ED "to get admitted again." Denies CP/palpitations, no SOB/cough, no abd pain, no N/V/D, no fevers, no injury.    Past Medical History  Diagnosis Date  . Hepatitis C   . Myositis     Left deltoid biopsy at [facility] 10/11/11.  Marland Kitchen Cirrhosis   . Chronic pain syndrome   . 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 Alcohol abuse     Psychiatric admissions for alcohol and drug abuse  . History of cocaine abuse   . Depression     Multiple psychiatric admissions  . Tobacco abuse   . Tattoos   . Low TSH level January 2013    Normal free T4 of 1.06  . Back pain, chronic   . Acute renal failure     Secondary to rhabdo. Treated with dialysis short-term.   Past Surgical History  Procedure Laterality Date  . Hernia repair    . Muscle biopsy    . Appendectomy      History  Substance Use Topics  . Smoking status: Current Every Day Smoker -- 1.00 packs/day for 40 years    Types: Cigarettes  . Smokeless tobacco: Current User  . Alcohol Use: 0.0 oz/week     Comment: last use of beer yesterday.     Review of Systems ROS: Statement: All systems negative except as marked or noted in the HPI; Constitutional: Negative for fever and chills. ; ; Eyes: Negative for eye pain, redness and discharge. ; ; ENMT: Negative for ear pain, hoarseness, nasal congestion, sinus pressure and sore throat. ; ; Cardiovascular: Negative for chest pain, palpitations, diaphoresis, dyspnea and peripheral edema. ; ;  Respiratory: Negative for cough, wheezing and stridor. ; ; Gastrointestinal: Negative for nausea, vomiting, diarrhea, abdominal pain, blood in stool, hematemesis, jaundice and rectal bleeding. . ; ; Genitourinary: Negative for dysuria, flank pain and hematuria. ; ; Musculoskeletal: +muscles aches. Negative for back pain and neck pain. Negative for swelling and trauma.; ; Skin: Negative for pruritus, rash, abrasions, blisters, bruising and skin lesion.; ; Neuro: Negative for headache, lightheadedness and neck stiffness. Negative for weakness, altered level of consciousness , altered mental status, extremity weakness, paresthesias, involuntary movement, seizure and syncope.       Allergies  Acetaminophen  Home Medications   Current Outpatient Rx  Name  Route  Sig  Dispense  Refill  . naproxen sodium (ALEVE) 220 MG tablet   Oral   Take 220 mg by mouth 2 (two) times daily as needed (Pain).          BP 142/87  Pulse 92  Temp(Src) 98.8 F (37.1 C) (Oral)  Resp 20  Ht 5\' 8"  (1.727 m)  Wt 145 lb (65.772 kg)  BMI 22.05 kg/m2  SpO2 99% Physical Exam 1430: Physical examination:  Nursing notes reviewed; Vital signs and O2 SAT reviewed;  Constitutional: Well developed, Well nourished, Well hydrated, In  no acute distress; Head:  Normocephalic, atraumatic; Eyes: EOMI, PERRL, No scleral icterus; ENMT: Mouth and pharynx normal, Mucous membranes moist; Neck: Supple, Full range of motion, No lymphadenopathy; Cardiovascular: Regular rate and rhythm, No gallop; Respiratory: Breath sounds clear & equal bilaterally, No rales, rhonchi, wheezes.  Speaking full sentences with ease, Normal respiratory effort/excursion; Chest: Nontender, Movement normal; Abdomen: Soft, Nontender, Nondistended, Normal bowel sounds; Genitourinary: No CVA tenderness; Extremities: Pulses normal, No tenderness, No edema, No calf edema or asymmetry.; Neuro: AA&Ox3, Major CN grossly intact.  Speech clear. Climbs on and off stretcher  easily by himself. Gait steady. No gross focal motor or sensory deficits in extremities.; Skin: Color normal, Warm, Dry.   ED Course   Procedures     MDM  MDM Reviewed: previous chart, nursing note and vitals Reviewed previous: labs Interpretation: labs   Results for orders placed during the hospital encounter of 05/04/13  CBC WITH DIFFERENTIAL      Result Value Range   WBC 7.2  4.0 - 10.5 K/uL   RBC 4.22  4.22 - 5.81 MIL/uL   Hemoglobin 13.1  13.0 - 17.0 g/dL   HCT 81.1 (*) 91.4 - 78.2 %   MCV 84.4  78.0 - 100.0 fL   MCH 31.0  26.0 - 34.0 pg   MCHC 36.8 (*) 30.0 - 36.0 g/dL   RDW 95.6  21.3 - 08.6 %   Platelets 183  150 - 400 K/uL   Neutrophils Relative % 62  43 - 77 %   Neutro Abs 4.5  1.7 - 7.7 K/uL   Lymphocytes Relative 22  12 - 46 %   Lymphs Abs 1.6  0.7 - 4.0 K/uL   Monocytes Relative 15 (*) 3 - 12 %   Monocytes Absolute 1.1 (*) 0.1 - 1.0 K/uL   Eosinophils Relative 1  0 - 5 %   Eosinophils Absolute 0.0  0.0 - 0.7 K/uL   Basophils Relative 0  0 - 1 %   Basophils Absolute 0.0  0.0 - 0.1 K/uL  BASIC METABOLIC PANEL      Result Value Range   Sodium 129 (*) 135 - 145 mEq/L   Potassium 3.7  3.5 - 5.1 mEq/L   Chloride 94 (*) 96 - 112 mEq/L   CO2 26  19 - 32 mEq/L   Glucose, Bld 134 (*) 70 - 99 mg/dL   BUN 6  6 - 23 mg/dL   Creatinine, Ser 5.78  0.50 - 1.35 mg/dL   Calcium 9.3  8.4 - 46.9 mg/dL   GFR calc non Af Amer >90  >90 mL/min   GFR calc Af Amer >90  >90 mL/min  ETHANOL      Result Value Range   Alcohol, Ethyl (B) <11  0 - 11 mg/dL  CK      Result Value Range   Total CK 191  7 - 232 U/L    1500:  Pt made aware that he will not be receiving narcotic pain meds for his chronic pain while in the ED. T/C to [facility]. Susie Cassette, case discussed, including:  HPI, pertinent PM/SHx, VS/PE, dx testing, ED course and treatment:  Agreeable to come to ED for eval.    Laray Anger, DO 05/06/13 1219

## 2013-05-04 NOTE — ED Notes (Signed)
States he is supposed to be taking Percocet but ran out and can't find a PMD who will take him.  States he doesn't want to use a pain clinic b/c he likes to drink and they won't let you drink.

## 2013-05-04 NOTE — Progress Notes (Signed)
0645 Pt requested to sign out AMA because he wanted to go smoke a cigarette. I offered to get a Nicotine patch ordered but Pt declined. Dr. Rito Ehrlich notified and Pt counseled that if he should leave his Sodium level could drop even lower which could make his condition worse or even lead to death. Pt stated that he understood and that he might be back later. IV discontinued and Pt signed AMA papers and escorted out by [employer].

## 2013-05-04 NOTE — ED Notes (Signed)
Patient upset he has not received pain medication.  Informed him that when MD gives an order for anything he will get it.

## 2013-05-07 NOTE — Progress Notes (Signed)
UR Chart Review Completed  

## 2014-01-14 DIAGNOSIS — F17200 Nicotine dependence, unspecified, uncomplicated: Secondary | ICD-10-CM | POA: Diagnosis not present

## 2014-06-29 ENCOUNTER — Encounter (HOSPITAL_COMMUNITY): Payer: Self-pay | Admitting: Emergency Medicine

## 2014-06-29 ENCOUNTER — Emergency Department (HOSPITAL_COMMUNITY)
Admission: EM | Admit: 2014-06-29 | Discharge: 2014-06-30 | Disposition: A | Discharge status: Other Acute Inpatient Hospital | Payer: Medicare Other | Attending: Emergency Medicine | Admitting: Emergency Medicine

## 2014-06-29 DIAGNOSIS — Z87448 Personal history of other diseases of urinary system: Secondary | ICD-10-CM | POA: Diagnosis not present

## 2014-06-29 DIAGNOSIS — G8929 Other chronic pain: Secondary | ICD-10-CM | POA: Insufficient documentation

## 2014-06-29 DIAGNOSIS — Z8659 Personal history of other mental and behavioral disorders: Secondary | ICD-10-CM | POA: Insufficient documentation

## 2014-06-29 DIAGNOSIS — Z8719 Personal history of other diseases of the digestive system: Secondary | ICD-10-CM | POA: Diagnosis not present

## 2014-06-29 DIAGNOSIS — Z720 Tobacco use: Secondary | ICD-10-CM | POA: Insufficient documentation

## 2014-06-29 DIAGNOSIS — Z8739 Personal history of other diseases of the musculoskeletal system and connective tissue: Secondary | ICD-10-CM | POA: Diagnosis not present

## 2014-06-29 DIAGNOSIS — F10230 Alcohol dependence with withdrawal, uncomplicated: Secondary | ICD-10-CM

## 2014-06-29 DIAGNOSIS — F1020 Alcohol dependence, uncomplicated: Secondary | ICD-10-CM | POA: Diagnosis present

## 2014-06-29 DIAGNOSIS — Z8619 Personal history of other infectious and parasitic diseases: Secondary | ICD-10-CM | POA: Insufficient documentation

## 2014-06-29 LAB — COMPREHENSIVE METABOLIC PANEL
ALBUMIN: 4.2 g/dL (ref 3.5–5.2)
ALK PHOS: 39 U/L (ref 39–117)
ALT: 42 U/L (ref 0–53)
ANION GAP: 13 (ref 5–15)
AST: 53 U/L — ABNORMAL HIGH (ref 0–37)
BUN: 6 mg/dL (ref 6–23)
CALCIUM: 10.2 mg/dL (ref 8.4–10.5)
CO2: 24 mEq/L (ref 19–32)
CREATININE: 0.7 mg/dL (ref 0.50–1.35)
Chloride: 93 mEq/L — ABNORMAL LOW (ref 96–112)
GFR calc non Af Amer: >90 mL/min (ref >90)
GLUCOSE: 90 mg/dL (ref 70–99)
POTASSIUM: 5.4 meq/L — AB (ref 3.7–5.3)
Sodium: 130 mEq/L — ABNORMAL LOW (ref 137–147)
TOTAL PROTEIN: 7.5 g/dL (ref 6.0–8.3)
Total Bilirubin: 0.3 mg/dL (ref 0.3–1.2)

## 2014-06-29 LAB — CBC
HEMATOCRIT: 43.9 % (ref 39.0–52.0)
HEMOGLOBIN: 16.1 g/dL (ref 13.0–17.0)
MCH: 31.9 pg (ref 26.0–34.0)
MCHC: 36.6 g/dL — AB (ref 30.0–36.0)
MCV: 87.1 fL (ref 78.0–100.0)
Platelets: 218 10*3/uL (ref 150–400)
RBC: 5.04 MIL/uL (ref 4.22–5.81)
RDW: 12.7 % (ref 11.5–15.5)
WBC: 8 10*3/uL (ref 4.0–10.5)

## 2014-06-29 LAB — RAPID URINE DRUG SCREEN, HOSP PERFORMED
Amphetamines: NOT DETECTED
Barbiturates: NOT DETECTED
Benzodiazepines: NOT DETECTED
COCAINE: POSITIVE — AB
OPIATES: NOT DETECTED
Tetrahydrocannabinol: NOT DETECTED

## 2014-06-29 LAB — CK TOTAL AND CKMB (NOT AT ARMC)
CK, MB: 8.1 ng/mL — AB (ref 0.3–4.0)
RELATIVE INDEX: 3.6 — AB (ref 0.0–2.5)
Total CK: 222 U/L (ref 7–232)

## 2014-06-29 LAB — ETHANOL

## 2014-06-29 LAB — ACETAMINOPHEN LEVEL

## 2014-06-29 LAB — SALICYLATE LEVEL

## 2014-06-29 MED ORDER — IBUPROFEN 200 MG PO TABS: 600.0000 mg | ORAL_TABLET | Freq: Three times a day (TID) | ORAL | Status: DC | PRN

## 2014-06-29 MED ORDER — NICOTINE 21 MG/24HR TD PT24
21.0000 mg | MEDICATED_PATCH | Freq: Every day | TRANSDERMAL | Status: DC
  Filled 2014-06-29: qty 1

## 2014-06-29 MED ORDER — THIAMINE HCL 100 MG/ML IJ SOLN: 100.0000 mg | Freq: Every day | INTRAMUSCULAR | Status: DC

## 2014-06-29 MED ORDER — OXYCODONE-ACETAMINOPHEN 5-325 MG PO TABS
2.0000 | ORAL_TABLET | Freq: Once | ORAL | Status: AC
  Administered 2014-06-29: 2 via ORAL
  Filled 2014-06-29: qty 2

## 2014-06-29 MED ORDER — SODIUM CHLORIDE 0.9 % IV BOLUS (SEPSIS)
1000.0000 mL | Freq: Once | INTRAVENOUS | Status: AC
  Administered 2014-06-29: 1000 mL via INTRAVENOUS

## 2014-06-29 MED ORDER — LORAZEPAM 1 MG PO TABS
1.0000 mg | ORAL_TABLET | Freq: Three times a day (TID) | ORAL | Status: DC | PRN
  Administered 2014-06-30 (×2): 1 mg via ORAL
  Filled 2014-06-29 (×3): qty 1

## 2014-06-29 MED ORDER — ONDANSETRON HCL 4 MG PO TABS: 4.0000 mg | ORAL_TABLET | Freq: Three times a day (TID) | ORAL | Status: DC | PRN

## 2014-06-29 MED ORDER — SODIUM POLYSTYRENE SULFONATE 15 GM/60ML PO SUSP
30.0000 g | Freq: Once | ORAL | Status: DC
  Filled 2014-06-29: qty 120

## 2014-06-29 MED ORDER — VITAMIN B-1 100 MG PO TABS
100.0000 mg | ORAL_TABLET | Freq: Every day | ORAL | Status: DC
  Administered 2014-06-29 – 2014-06-30 (×2): 100 mg via ORAL
  Filled 2014-06-29 (×2): qty 1

## 2014-06-29 MED ORDER — LORAZEPAM 1 MG PO TABS: 0.0000 mg | ORAL_TABLET | Freq: Two times a day (BID) | ORAL | Status: DC

## 2014-06-29 MED ORDER — ALUM & MAG HYDROXIDE-SIMETH 200-200-20 MG/5ML PO SUSP: 30.0000 mL | ORAL | Status: DC | PRN

## 2014-06-29 MED ORDER — OXYCODONE-ACETAMINOPHEN 5-325 MG PO TABS
1.0000 | ORAL_TABLET | Freq: Four times a day (QID) | ORAL | Status: DC | PRN
  Administered 2014-06-29 – 2014-06-30 (×4): 1 via ORAL
  Filled 2014-06-29 (×4): qty 1

## 2014-06-29 MED ORDER — ZOLPIDEM TARTRATE 5 MG PO TABS: 5.0000 mg | ORAL_TABLET | Freq: Every evening | ORAL | Status: DC | PRN

## 2014-06-29 MED ORDER — LORAZEPAM 1 MG PO TABS
0.0000 mg | ORAL_TABLET | Freq: Four times a day (QID) | ORAL | Status: DC
  Administered 2014-06-29: 1 mg via ORAL
  Administered 2014-06-29: 2 mg via ORAL
  Administered 2014-06-30 (×2): 1 mg via ORAL
  Filled 2014-06-29: qty 2
  Filled 2014-06-29 (×2): qty 1

## 2014-06-29 MED ORDER — IBUPROFEN 800 MG PO TABS
800.0000 mg | ORAL_TABLET | Freq: Once | ORAL | Status: AC
  Administered 2014-06-29: 800 mg via ORAL
  Filled 2014-06-29: qty 1

## 2014-06-29 NOTE — ED Notes (Signed)
Pt voluntarily admitted for alcohol detox. Denies SI/HI and A/V hallucinations.   Pt reported hx of muscle disorder. Pt reported constant aching pain from "back all the way down to my leg".  He currently rates his pain 9/10.  Pt reported hx of insomnia and consuming an 18-24 pack of beer daily "sometimes more, whatever I can get my hands on" as a means of self medicating.  Pt also reports hx of drug use including LSD, Acid, Marijuana, and Cocaine "about 20 years ago" but recently had a "hit of crack" but that is not the norm.  He has 3 children under the age of 47 years old and wants to get sober for them.  Patient currently lives with 47 yr old mother. Pt's children live with their mother and maternal grandparents but he see's them on weekends. Pt named wife and children as his support system. Pt requests information about [facility] facility.

## 2014-06-29 NOTE — ED Notes (Signed)
When I went in to get patient to sign his patient belonging sheet, patient started explaining to writer that he wants to see if a doctor can send him to pain management because his leg is hurting. He said because of his pain he chooses to drink 12 to 18 cans of beer a day and that he had tried crack yesterday if though that was not his first time, but he would rather drink. He explained to Clinical research associatewriter that he is tired of spending all of this money on drinking. Writer asked how much does he spends he said 30 to 40 dollars a day and he said he just would like some help. Writer explained that someone would be in to talk with him and they will talk about what is best for.

## 2014-06-29 NOTE — ED Provider Notes (Signed)
Patient signed out to me by Rexanne ManoMintz, PA-C at change of shift. Patient here for detox from alcohol. No history of DTs. No SI/HI. Hx of polymyositis. CK pending to rule out rhabdomyolysis. Hyponatremic, received IVF. Patient with mild hyperkalemia, will give kayexalate. Anion gap 13. Patient otherwise can be medically cleared to psych.   Patient with total CK of 222. No rhabo. No chest pain, shortness of breath. Patient feels improved after Ativan and Percocet. Patient reports only doing 1 hit of crack yesterday. Patient medically cleared for psych.   Results for orders placed during the hospital encounter of 06/29/14  ACETAMINOPHEN LEVEL      Result Value Ref Range   Acetaminophen (Tylenol), Serum <15.0  10 - 30 ug/mL  CBC      Result Value Ref Range   WBC 8.0  4.0 - 10.5 K/uL   RBC 5.04  4.22 - 5.81 MIL/uL   Hemoglobin 16.1  13.0 - 17.0 g/dL   HCT 40.943.9  81.139.0 - 91.452.0 %   MCV 87.1  78.0 - 100.0 fL   MCH 31.9  26.0 - 34.0 pg   MCHC 36.6 (*) 30.0 - 36.0 g/dL   RDW 78.212.7  95.611.5 - 21.315.5 %   Platelets 218  150 - 400 K/uL  COMPREHENSIVE METABOLIC PANEL      Result Value Ref Range   Sodium 130 (*) 137 - 147 mEq/L   Potassium 5.4 (*) 3.7 - 5.3 mEq/L   Chloride 93 (*) 96 - 112 mEq/L   CO2 24  19 - 32 mEq/L   Glucose, Bld 90  70 - 99 mg/dL   BUN 6  6 - 23 mg/dL   Creatinine, Ser 0.860.70  0.50 - 1.35 mg/dL   Calcium 57.810.2  8.4 - 46.910.5 mg/dL   Total Protein 7.5  6.0 - 8.3 g/dL   Albumin 4.2  3.5 - 5.2 g/dL   AST 53 (*) 0 - 37 U/L   ALT 42  0 - 53 U/L   Alkaline Phosphatase 39  39 - 117 U/L   Total Bilirubin 0.3  0.3 - 1.2 mg/dL   GFR calc non Af Amer >90  >90 mL/min   GFR calc Af Amer >90  >90 mL/min   Anion gap 13  5 - 15  ETHANOL      Result Value Ref Range   Alcohol, Ethyl (B) <11  0 - 11 mg/dL  SALICYLATE LEVEL      Result Value Ref Range   Salicylate Lvl <2.0 (*) 2.8 - 20.0 mg/dL  URINE RAPID DRUG SCREEN (HOSP PERFORMED)      Result Value Ref Range   Opiates NONE DETECTED  NONE DETECTED   Cocaine POSITIVE (*) NONE DETECTED   Benzodiazepines NONE DETECTED  NONE DETECTED   Amphetamines NONE DETECTED  NONE DETECTED   Tetrahydrocannabinol NONE DETECTED  NONE DETECTED   Barbiturates NONE DETECTED  NONE DETECTED  CK TOTAL AND CKMB      Result Value Ref Range   Total CK 222  7 - 232 U/L   CK, MB 8.1 (*) 0.3 - 4.0 ng/mL   Relative Index 3.6 (*) 0.0 - 2.5    EKG Interpretation  Date/Time:  Monday June 29 2014 18:39:23 EDT Ventricular Rate:  71 PR Interval:  188 QRS Duration: 82 QT Interval:  374 QTC Calculation: 406 R Axis:   85 Text Interpretation:  Normal sinus rhythm Anteroseptal infarct , age undetermined Abnormal ECG No significant change since last tracing Confirmed by  POLLINA  MD, CHRISTOPHER [phone]([id number]) on 06/29/2014 6:57:57 PM        Mora BellmanHannah S Azure Budnick, PA-C 06/30/14 1311

## 2014-06-29 NOTE — ED Notes (Signed)
PA at bedside.

## 2014-06-29 NOTE — ED Notes (Signed)
Pt requesting detox from ETOH, last drink 06/28/14. Denies n/v, c/o headache.

## 2014-06-29 NOTE — ED Provider Notes (Signed)
CSN: [id number]     Arrival date & time 06/29/14  [id number] History   First MD Initiated Contact with Patient 06/29/14 [phone]     Chief Complaint  Patient presents with  . detox      (Consider location/radiation/quality/duration/timing/severity/associated sxs/prior Treatment) HPI [name] is a 48 year old male with past medical history of alcohol abuse, cocaine abuse, depression, hepatitis C, polymyositis, liver cirrhosis who presents to the ER today requesting alcohol detox. Patient states he has been an alcoholic since he was [age], and reports seeking help multiple times, however tends to relapse do to his chronic pain from polymyositis. Patient states he has poor followup with primary care, and has little to no ongoing care with his chronic pain. Patient states she's been referred to pain clinic in the past, however was turned away due to to drinking alcohol during the same day. Patient states he requests to get detoxed in order to obtain appropriate followup and treatment regarding his chronic medical problems. Patient also endorses occasional using cocaine. Patient states he only uses cocaine when he is drunk. He states his like to stop using cocaine as well, however states since he only uses it while he is drinking, he feels that alcohol detox will help him stop with that as well. Patient denies any signs or symptoms of withdrawal. Patient denies nausea, vomiting, headache, blurred vision, dizziness, abdominal pain, dysuria, chest pain, shortness of breath. Patient does not endorse having any suicidal or homicidal ideation the Past Medical History  Diagnosis Date  . Hepatitis C   . Myositis     [facility]/11.  Marland Kitchen. Cirrhosis   . Chronic pain syndrome   . 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 Alcohol abuse     Psychiatric admissions for alcohol and drug abuse  . History of cocaine abuse   . Depression     Multiple psychiatric admissions  . Tobacco abuse   . Tattoos   . Low TSH level  January 2013    Normal free T4 of 1.06  . Back pain, chronic   . Acute renal failure     Secondary to rhabdo. Treated with dialysis short-term.   Past Surgical History  Procedure Laterality Date  . Hernia repair    . Muscle biopsy    . Appendectomy     No family history on file. History  Substance Use Topics  . Smoking status: Current Every Day Smoker -- 1.00 packs/day for 40 years    Types: Cigarettes  . Smokeless tobacco: Current User  . Alcohol Use: 0.0 oz/week     Comment: last use of beer yesterday.     Review of Systems  Constitutional: Negative for fever.  HENT: Negative for trouble swallowing.   Eyes: Negative for visual disturbance.  Respiratory: Negative for shortness of breath.   Cardiovascular: Negative for chest pain.  Gastrointestinal: Negative for nausea, vomiting and abdominal pain.  Genitourinary: Negative for dysuria.  Musculoskeletal: Negative for neck pain.  Skin: Negative for rash.  Neurological: Negative for dizziness, weakness and numbness.  Psychiatric/Behavioral: Negative.  Negative for suicidal ideas.       Substance abuse.      Allergies  Acetaminophen  Home Medications   Prior to Admission medications   Medication Sig Start Date End Date Taking? Authorizing Provider  ibuprofen (ADVIL,MOTRIN) 200 MG tablet Take 800 mg by mouth every 6 (six) hours as needed for moderate pain.   Yes Historical Provider, MD   BP 129/78  Pulse 79  Temp(Src) 98.1 F (36.7 C) (Oral)  Resp 20  SpO2 98% Physical Exam  Constitutional: He is oriented to person, place, and time. He appears well-developed and well-nourished. No distress.  HENT:  Head: Normocephalic and atraumatic.  Mouth/Throat: Oropharynx is clear and moist. No oropharyngeal exudate.  Eyes: Right eye exhibits no discharge. Left eye exhibits no discharge. No scleral icterus.  Neck: Normal range of motion.  Cardiovascular: Normal rate, regular rhythm and normal heart sounds.   No murmur  heard. Pulmonary/Chest: Effort normal and breath sounds normal. No respiratory distress.  Abdominal: Soft. There is no tenderness.  Musculoskeletal: Normal range of motion. He exhibits no edema and no tenderness.  Patient has diffuse pain with range of motion or palpation to his lower back which radiates down to the major muscle groups of his legs. Patient reporting this is consistent with polymyositis pain.  Neurological: He is alert and oriented to person, place, and time. No cranial nerve deficit. Coordination normal.  Skin: Skin is warm and dry. No rash noted. He is not diaphoretic.  Psychiatric:  Patient's mood is depressed with affect appropriate to mood. Patient's speech is normal, not slurred or tangential. His behaviors revolve around trying to self medicate his pain with alcohol as opposed to obtaining proper medical followup. Patient's thought content is appropriate, and he does not appear to be responding to any internal stimuli. Patient's judgment is impaired in that he cannot stop himself from drinking despite him knowing the consequences of it determine him from proper medical care with his chronic pain.    ED Course  Procedures (including critical care time) Labs Review Labs Reviewed  CBC - Abnormal; Notable for the following:    MCHC 36.6 (*)    All other components within normal limits  COMPREHENSIVE METABOLIC PANEL - Abnormal; Notable for the following:    Sodium 130 (*)    Potassium 5.4 (*)    Chloride 93 (*)    AST 53 (*)    All other components within normal limits  SALICYLATE LEVEL - Abnormal; Notable for the following:    Salicylate Lvl <2.0 (*)    All other components within normal limits  URINE RAPID DRUG SCREEN (HOSP PERFORMED) - Abnormal; Notable for the following:    Cocaine POSITIVE (*)    All other components within normal limits  ACETAMINOPHEN LEVEL  ETHANOL  CK TOTAL AND CKMB    Imaging Review No results found.   EKG Interpretation None       MDM   Final diagnoses:  Alcohol dependence with uncomplicated withdrawal    48 year old male with history of alcoholism and abuse requesting detox. Patient stating he went to behavioral health earlier, and was turned away and told he needed medical clearance first. Will obtain labs, and consult TTS.  Patient with mild hyponatremia at 1:30. Will correct this with 0.9% normal saline.  2:30 PM:  Patient stating he is experiencing much more pain than usual, and that he is experiencing pain in his lower back, and bilateral legs. Patient states pain is consistent with polymyositis pain he has experienced in the past. Patient is concerned for rhabdomyolysis, and states he has had to be admitted to the hospital before do to muscle breakdown. We will obtain CK-MB/total CK and continue fluids. Percocet by mouth for pain.  4:30 PM: Still waiting for CK MB/total CK. Patient signed out to [facility], PA-C with plan to follow up with results on CK. If signs of rhabdomyolysis and extremely elevated CK  are present, consider admission to medicine versus behavioral health.  BP 129/78  Pulse 79  Temp(Src) 98.1 F (36.7 C) (Oral)  Resp 20  SpO2 98%  Signed,  Ladona MowJoe Inis Borneman, PA-C 5:06 PM  Patient seen and discussed with Dr. Linwood DibblesJon Knapp, MD  Monte FantasiaJoseph W Lovis More, PA-C 06/29/14 1706

## 2014-06-29 NOTE — ED Notes (Signed)
Pt offered ibuprofen for pain he refused. Pt observed talking on the phone stating:"They don't give a shit, I will just leave and self-medicate."

## 2014-06-29 NOTE — ED Notes (Signed)
Pt eating lunch at this time

## 2014-06-29 NOTE — ED Notes (Signed)
Pt is requesting pain medications, PA notified. Pt sts he needs to have percocet every 3-4 hours "around the clock". Pt sts when he is not taking pain meds he needs to medicate himself with alcohol. Pt is getting increasingly agitated. PA aware.

## 2014-06-29 NOTE — ED Notes (Signed)
Dahlia ClientHannah PA notified of critical lab results CKMB 8.1

## 2014-06-29 NOTE — ED Notes (Signed)
Bed: [facility] Expected date:  Expected time:  Means of arrival:  Comments: 

## 2014-06-29 NOTE — BH Assessment (Signed)
Assessment Note  [name] is an 49 y.o. male with a long standing hx of alcoholism. Patient started drinking at age 76 or 31. He drinks a 12 pack of beer daily. His last drink was yesterday approx 7pm or 8pm. Withdrawal symptoms include tremors, diarrhea, and cold chills. No hx of seizures. Pt does have a hx of black outs. He presents to [facility] today requesting alcohol detox. Sts that his alcoholism is triggered by chronic pain issues. Pt sts, "If I wasn't in so much pain all the time I wouldn't drink" He has participated in detox and rehab programs in the past. Pt reports reciving services at [facility], [facility] [place], and a facility that he is unable to recall in [facility]. Pt sts, "I want to see my grandchildren grow up" and "I am afraid that my drinking is causing me to have Cirrhosis". Patient explains that his alcoholism has also led to legal issues. He has a total of 9 DUI's starting in 1986. His DUI obtained was November 2014. Patient has served 3 state prison sentences for his alcoholism. Pt also reports cocaine use. He started using cocaine at age 53. He uses $50-$150 1x per week. His use has been on-going. Last use was 06/28/2014 (1 hit). No family hx of substance abuse  Patient denies SI, HI, and AVH's. He reports no associated hx. No depression reported. He does not sleep well reporting 4 hrs of sleep per night. His appetite has increased with some wt. Gain "few pounds". His stressor consist of getting his spouse to move back in the home with him. Sts she moved out due to his drinking. He reports on-going severe anxiety.   Axis I:  Alcohol Dependence Axis II: Deferred Axis III:  Past Medical History  Diagnosis Date  . Hepatitis C   . Myositis     Left deltoid [facility] 10/11/11.  Marland Kitchen Cirrhosis   . Chronic pain syndrome   . 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 Alcohol abuse     Psychiatric admissions for alcohol and drug abuse  . History of cocaine abuse   . Depression    Multiple psychiatric admissions  . Tobacco abuse   . Tattoos   . Low TSH level January 2013    Normal free T4 of 1.06  . Back pain, chronic   . Acute renal failure     Secondary to rhabdo. Treated with dialysis short-term.   Axis IV: other psychosocial or environmental problems, problems related to legal system/crime, problems related to social environment and problems with access to health care services Axis V: 31-40 impairment in reality testing  Past Medical History:  Past Medical History  Diagnosis Date  . Hepatitis C   . Myositis     [facility] deltoid [facility] at [facility] 10/11/11.  Marland Kitchen Cirrhosis   . Chronic pain syndrome   . 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 Alcohol abuse     Psychiatric admissions for alcohol and drug abuse  . History of cocaine abuse   . Depression     Multiple psychiatric admissions  . Tobacco abuse   . Tattoos   . Low TSH level January 2013    Normal free T4 of 1.06  . Back pain, chronic   . Acute renal failure     Secondary to rhabdo. Treated with dialysis short-term.    Past Surgical History  Procedure Laterality Date  . Hernia repair    . Muscle biopsy    . Appendectomy  Family History: No family history on file.  Social History:  reports that he has been smoking Cigarettes.  He has a 40 pack-year smoking history. He uses smokeless tobacco. He reports that he drinks alcohol. He reports that he uses illicit drugs (Cocaine and Marijuana).  Additional Social History:  Alcohol / Drug Use Pain Medications: SEE MAR Prescriptions: SEE MAR Over the Counter: SEE MAR History of alcohol / drug use?: No history of alcohol / drug abuse  CIWA: CIWA-Ar BP: 129/78 mmHg Pulse Rate: 79 Nausea and Vomiting: mild nausea with no vomiting Tactile Disturbances: very mild itching, pins and needles, burning or numbness Tremor: not visible, but can be felt fingertip to fingertip Auditory Disturbances: not present Paroxysmal Sweats: no sweat visible Visual Disturbances:  very mild sensitivity Anxiety: three Headache, Fullness in Head: mild Agitation: somewhat more than normal activity Orientation and Clouding of Sensorium: oriented and can do serial additions CIWA-Ar Total: 10 COWS:    Allergies:  Allergies  Allergen Reactions  . Acetaminophen Other (See Comments)    Liver disease     Home Medications:  (Not in a hospital admission)  OB/GYN Status:  No LMP for male patient.  General Assessment Data Location of Assessment: [facility] Is this a Tele or Face-to-Face Assessment?: Tele Assessment Is this an Initial Assessment or a Re-assessment for this encounter?: Initial Assessment Living Arrangements: Spouse/significant other Can pt return to current living arrangement?: Yes Admission Status: Voluntary Is patient capable of signing voluntary admission?: Yes Transfer from: Acute Hospital Referral Source: Self/Family/Friend     [facility] Living Arrangements: Spouse/significant other Name of Psychiatrist:  (No psychiatrist ) Name of Therapist:  (No therapist )  Education Status Is patient currently in school?: No  Risk to self with the past 6 months Suicidal Ideation: No Suicidal Intent: No Is patient at risk for suicide?: No Suicidal Plan?: No Access to Means: No What has been your use of drugs/alcohol within the last 12 months?:  (n/a) Previous Attempts/Gestures: No How many times?:  (0) Other Self Harm Risks:  (n/a) Intentional Self Injurious Behavior: None Family Suicide History: No Recent stressful life event(s): Other (Comment) (chronic pain issues, health, alcoholism) Persecutory voices/beliefs?: No Depression: No Substance abuse history and/or treatment for substance abuse?: No Suicide prevention information given to non-admitted patients: Not applicable  Risk to Others within the past 6 months Homicidal Ideation: No Thoughts of Harm to Others: No Current Homicidal Intent: No Current Homicidal Plan: No Access  to Homicidal Means: No Identified Victim:  (n/a) History of harm to others?: No Assessment of Violence: None Noted Violent Behavior Description:  (patient is calm and cooperative ) Does patient have access to weapons?: No Does patient have a court date: No  Psychosis Hallucinations: None noted Delusions: None noted  Mental Status Report Appear/Hygiene: Disheveled Eye Contact: Good Motor Activity: Freedom of movement Speech: Logical/coherent Level of Consciousness: Alert Mood: Depressed Affect: Appropriate to circumstance Anxiety Level: Severe Thought Processes: Coherent;Relevant Judgement: Unimpaired Orientation: Person;Situation;Place;Time Obsessive Compulsive Thoughts/Behaviors: None  Cognitive Functioning Concentration: Normal Memory: Recent Intact;Remote Intact IQ: Average Insight: Fair Impulse Control: Fair Appetite: Fair Weight Loss:  (none reported ) Weight Gain:  (Pt sts, :"I have gained a few pounds in the past month") Sleep: Decreased Total Hours of Sleep:  (4 hours per night) Vegetative Symptoms: None  ADLScreening [facility] Assessment Services) Patient's cognitive ability adequate to safely complete daily activities?: Yes Patient able to express need for assistance with ADLs?: No Independently performs ADLs?: Yes (appropriate  for developmental age)  Prior Inpatient Therapy Prior Inpatient Therapy: Yes Prior Therapy Dates:  (multiple x's in the past ) Prior Therapy Facilty/Provider(s):  [facility], [facility], [facility] Reason for Treatment:  (alcohol detox and rehabilitation )  Prior Outpatient Therapy Prior Outpatient Therapy: No Prior Therapy Dates:  (n/a) Prior Therapy Facilty/Provider(s):  (n/a) Reason for Treatment:  (n/a)  ADL Screening (condition at time of admission) Patient's cognitive ability adequate to safely complete daily activities?: Yes Is the patient deaf or have difficulty hearing?: No Does the patient  have difficulty seeing, even when wearing glasses/contacts?: No Does the patient have difficulty concentrating, remembering, or making decisions?: Yes Patient able to express need for assistance with ADLs?: No Does the patient have difficulty dressing or bathing?: No Independently performs ADLs?: Yes (appropriate for developmental age) Does the patient have difficulty walking or climbing stairs?: No Weakness of Legs: None Weakness of Arms/Hands: None  Home Assistive Devices/Equipment Home Assistive Devices/Equipment: None    Abuse/Neglect Assessment (Assessment to be complete while patient is alone) Physical Abuse: Denies Verbal Abuse: Denies Sexual Abuse: Denies Exploitation of patient/patient's resources: Denies Self-Neglect: Denies Values / Beliefs Cultural Requests During Hospitalization: None Spiritual Requests During Hospitalization: None   Advance Directives (For Healthcare) Does patient have an advance directive?: No Would patient like information on creating an advanced directive?: No - patient declined information Nutrition Screen- [facility] Patient's home diet: Regular  Additional Information 1:1 In Past 12 Months?: No CIRT Risk: No Elopement Risk: No Does patient have medical clearance?: Yes     Disposition:  Disposition Initial Assessment Completed for this Encounter: Yes Disposition of Patient: Inpatient treatment program Type of inpatient treatment program: Adult [facility](Jamison Lord, NP recommends inpatient treatment)  On Site Evaluation by:   Reviewed with Physician:    Melynda Rippleerry, Den[facility] 06/29/2014 3:45 PM

## 2014-06-30 ENCOUNTER — Encounter (HOSPITAL_COMMUNITY): Payer: Self-pay | Admitting: *Deleted

## 2014-06-30 ENCOUNTER — Observation Stay (HOSPITAL_COMMUNITY)
Admission: AD | Admit: 2014-06-30 | Discharge: 2014-06-30 | Disposition: A | Discharge status: Home / Self Care | Payer: Medicare Other | Source: Intra-hospital | Attending: Psychiatry | Admitting: Psychiatry

## 2014-06-30 DIAGNOSIS — Z720 Tobacco use: Secondary | ICD-10-CM | POA: Insufficient documentation

## 2014-06-30 DIAGNOSIS — F10229 Alcohol dependence with intoxication, unspecified: Principal | ICD-10-CM | POA: Insufficient documentation

## 2014-06-30 DIAGNOSIS — Z886 Allergy status to analgesic agent status: Secondary | ICD-10-CM | POA: Insufficient documentation

## 2014-06-30 DIAGNOSIS — Z79899 Other long term (current) drug therapy: Secondary | ICD-10-CM | POA: Insufficient documentation

## 2014-06-30 DIAGNOSIS — F329 Major depressive disorder, single episode, unspecified: Secondary | ICD-10-CM | POA: Insufficient documentation

## 2014-06-30 DIAGNOSIS — M549 Dorsalgia, unspecified: Secondary | ICD-10-CM | POA: Insufficient documentation

## 2014-06-30 DIAGNOSIS — K7030 Alcoholic cirrhosis of liver without ascites: Secondary | ICD-10-CM | POA: Insufficient documentation

## 2014-06-30 DIAGNOSIS — G8929 Other chronic pain: Secondary | ICD-10-CM | POA: Diagnosis not present

## 2014-06-30 DIAGNOSIS — M3320 Polymyositis, organ involvement unspecified: Secondary | ICD-10-CM | POA: Diagnosis not present

## 2014-06-30 DIAGNOSIS — F1020 Alcohol dependence, uncomplicated: Secondary | ICD-10-CM | POA: Diagnosis present

## 2014-06-30 DIAGNOSIS — F10129 Alcohol abuse with intoxication, unspecified: Secondary | ICD-10-CM | POA: Diagnosis present

## 2014-06-30 DIAGNOSIS — B1920 Unspecified viral hepatitis C without hepatic coma: Secondary | ICD-10-CM | POA: Diagnosis not present

## 2014-06-30 DIAGNOSIS — F1410 Cocaine abuse, uncomplicated: Secondary | ICD-10-CM | POA: Diagnosis not present

## 2014-06-30 MED ORDER — NICOTINE 21 MG/24HR TD PT24
21.0000 mg | MEDICATED_PATCH | Freq: Every day | TRANSDERMAL | Status: DC
  Filled 2014-06-30 (×2): qty 1

## 2014-06-30 MED ORDER — ALUM & MAG HYDROXIDE-SIMETH 200-200-20 MG/5ML PO SUSP: 30.0000 mL | ORAL | Status: DC | PRN

## 2014-06-30 MED ORDER — ZOLPIDEM TARTRATE 5 MG PO TABS: 5.0000 mg | ORAL_TABLET | Freq: Every evening | ORAL | Status: DC | PRN

## 2014-06-30 MED ORDER — LORAZEPAM 1 MG PO TABS: 0.0000 mg | ORAL_TABLET | Freq: Four times a day (QID) | ORAL | Status: DC

## 2014-06-30 MED ORDER — IBUPROFEN 600 MG PO TABS: 600.0000 mg | ORAL_TABLET | Freq: Three times a day (TID) | ORAL | Status: DC | PRN

## 2014-06-30 MED ORDER — MAGNESIUM HYDROXIDE 400 MG/5ML PO SUSP: 30.0000 mL | Freq: Every day | ORAL | Status: DC | PRN

## 2014-06-30 MED ORDER — LORAZEPAM 1 MG PO TABS: 0.0000 mg | ORAL_TABLET | Freq: Two times a day (BID) | ORAL | Status: DC

## 2014-06-30 MED ORDER — ONDANSETRON HCL 4 MG PO TABS: 4.0000 mg | ORAL_TABLET | Freq: Three times a day (TID) | ORAL | Status: DC | PRN

## 2014-06-30 MED ORDER — LORAZEPAM 1 MG PO TABS
1.0000 mg | ORAL_TABLET | Freq: Three times a day (TID) | ORAL | Status: DC | PRN
Start: 2014-06-30 — End: 2014-06-30

## 2014-06-30 MED ORDER — OXYCODONE-ACETAMINOPHEN 5-325 MG PO TABS: 1.0000 | ORAL_TABLET | Freq: Four times a day (QID) | ORAL | Status: DC | PRN

## 2014-06-30 MED ORDER — ACETAMINOPHEN 325 MG PO TABS: 650.0000 mg | ORAL_TABLET | Freq: Four times a day (QID) | ORAL | Status: DC | PRN

## 2014-06-30 NOTE — ED Notes (Signed)
Observation unit requested that pt be transferred after 12:15pm

## 2014-06-30 NOTE — Discharge Instructions (Signed)
To help you maintain a sober lifestyle, an outpatient treatment program may be beneficial to you.  Contact [facility] at your earliest convenience to schedule an intake appointment:       [facility] [place]      [address]      [place], [place] [address]      [phone]

## 2014-06-30 NOTE — BH Assessment (Signed)
Dr. Jannifer FranklinAkintayo [facility] Patient assigned to observation unit bed #5. Support paperwork completed.

## 2014-06-30 NOTE — H&P (Signed)
[facility] OBS UNIT H&P    [name] is an 48 y.o. male. Total Time spent with patient: 45 minutes  Assessment: AXIS I:  Alcohol abuse; substance abuse; substance-induced mood disorder.  AXIS II:  Deferred AXIS III:   Past Medical History  Diagnosis Date  . Hepatitis C   . Myositis     Left deltoid biopsy at [facility] 10/11/11.  Marland Kitchen Cirrhosis   . Chronic pain syndrome   . 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 Alcohol abuse     Psychiatric admissions for alcohol and drug abuse  . History of cocaine abuse   . Depression     Multiple psychiatric admissions  . Tobacco abuse   . Tattoos   . Low TSH level January 2013    Normal free T4 of 1.06  . Back pain, chronic   . Acute renal failure     Secondary to rhabdo. Treated with dialysis short-term.   AXIS IV:  other psychosocial or environmental problems and problems related to social environment AXIS V:  61-70 mild symptoms  Plan:  No evidence of imminent risk to self or others at present.   Patient does not meet criteria for psychiatric inpatient admission. Supportive therapy provided about ongoing stressors. Refer to IOP. Discussed crisis plan, support from social network, calling 911, coming to the Emergency Department, and calling Suicide Hotline.  Subjective:   [name] is a 48 y.o. male patient admitted with alcohol intoxication. Pt denies SI, HI, and AVH, contracts for safety. Pt reports that he would "like to be with my own stuff in my own place and I didn't know it was going to be all open like this. Arnetha Gula can just send me home and I'll go to [facility] tomorrow. I'm fine, I just don't want to be in this Obs Unit here". Pt is upset about being in the OBS unit but is agreeable to speak to Tom with TTS for referrals before leaving. Dr. Dwyane Dee in agreement with pt leaving as well as he does not meet admission criteria as mentioned above nor he is in any withdrawals.   HPI:  [name] is a 48 year old male with past medical history of alcohol abuse,  cocaine abuse, depression, hepatitis C, polymyositis, liver cirrhosis who presents to the ER today requesting alcohol detox. Patient states he has been an alcoholic since he was 12, and reports seeking help multiple times, however tends to relapse do to his chronic pain from polymyositis. Patient states he has poor followup with primary care, and has little to no ongoing care with his chronic pain. Patient states she's been referred to pain clinic in the past, however was turned away due to to drinking alcohol during the same day. Patient states he requests to get detoxed in order to obtain appropriate followup and treatment regarding his chronic medical problems. Patient also endorses occasional using cocaine. Patient states he only uses cocaine when he is drunk. He states his like to stop using cocaine as well, however states since he only uses it while he is drinking, he feels that alcohol detox will help him stop with that as well. Patient denies any signs or symptoms of withdrawal. Patient denies nausea, vomiting, headache, blurred vision, dizziness, abdominal pain, dysuria, chest pain, shortness of breath.  Patient does not endorse having any suicidal or homicidal ideation, he denies delusional thinking, psychosis or depression.  HPI Elements:   Location:  alcohol dependence. Quality:  severe. Context:  self medication for pain.  Past  Psychiatric History: Past Medical History  Diagnosis Date  . Hepatitis C   . Myositis     Left deltoid biopsy at [facility] 10/11/11.  Marland Kitchen Cirrhosis   . Chronic pain syndrome   . 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 Alcohol abuse     Psychiatric admissions for alcohol and drug abuse  . History of cocaine abuse   . Depression     Multiple psychiatric admissions  . Tobacco abuse   . Tattoos   . Low TSH level January 2013    Normal free T4 of 1.06  . Back pain, chronic   . Acute renal failure     Secondary to rhabdo. Treated with dialysis short-term.    reports that he  has been smoking Cigarettes.  He has a 40 pack-year smoking history. He uses smokeless tobacco. He reports that he drinks alcohol. He reports that he uses illicit drugs (Cocaine and Marijuana). History reviewed. No pertinent family history.   Living Arrangements: Spouse/significant other   Abuse/Neglect [facility]) Physical Abuse: Denies Verbal Abuse: Denies Sexual Abuse: Denies Allergies:   Allergies  Allergen Reactions  . Acetaminophen Other (See Comments)    Liver disease     ACT Assessment Complete:  Yes:    Educational Status    Risk to Self: Risk to self with the past 6 months Is patient at risk for suicide?: No Substance abuse history and/or treatment for substance abuse?: Yes  Risk to Others:    Abuse: Abuse/Neglect Assessment (Assessment to be complete while patient is alone) Physical Abuse: Denies Verbal Abuse: Denies Sexual Abuse: Denies Exploitation of patient/patient's resources: Denies Self-Neglect: Denies  Prior Inpatient Therapy:    Prior Outpatient Therapy:    Additional Information:        Objective: Blood pressure 132/86, pulse 80, temperature 98.3 F (36.8 C), temperature source Oral, resp. rate 16, height _0  (1.753 m), weight 63.504 kg (140 lb), SpO2 100.00%.Body mass index is 20.67 kg/(m^2). Results for orders placed during the hospital encounter of 06/29/14 (from the past 72 hour(s))  URINE RAPID DRUG SCREEN (HOSP PERFORMED)     Status: Abnormal   Collection Time    06/29/14  9:02 AM      Result Value Ref Range   Opiates NONE DETECTED  NONE DETECTED   Cocaine POSITIVE (*) NONE DETECTED   Benzodiazepines NONE DETECTED  NONE DETECTED   Amphetamines NONE DETECTED  NONE DETECTED   Tetrahydrocannabinol NONE DETECTED  NONE DETECTED   Barbiturates NONE DETECTED  NONE DETECTED   Comment:            DRUG SCREEN FOR MEDICAL PURPOSES     ONLY.  IF CONFIRMATION IS NEEDED     FOR ANY PURPOSE, NOTIFY LAB     WITHIN 5 DAYS.                LOWEST DETECTABLE  LIMITS     FOR URINE DRUG SCREEN     Drug Class       Cutoff (ng/mL)     Amphetamine      1000     Barbiturate      200     Benzodiazepine   175     Tricyclics       102     Opiates          300     Cocaine          300     THC  [place]     Status: None   Collection Time    06/29/14  9:52 AM      Result Value Ref Range   Acetaminophen (Tylenol), Serum <15.0  10 - 30 ug/mL   Comment:            THERAPEUTIC CONCENTRATIONS VARY     SIGNIFICANTLY. A RANGE OF 10-30     ug/mL MAY BE AN EFFECTIVE     CONCENTRATION FOR MANY PATIENTS.     HOWEVER, SOME ARE BEST TREATED     AT CONCENTRATIONS OUTSIDE THIS     RANGE.     ACETAMINOPHEN CONCENTRATIONS     >150 ug/mL AT 4 HOURS AFTER     INGESTION AND >50 ug/mL AT 12     HOURS AFTER INGESTION ARE     OFTEN ASSOCIATED WITH TOXIC     REACTIONS.  CBC     Status: Abnormal   Collection Time    06/29/14  9:52 AM      Result Value Ref Range   WBC 8.0  4.0 - 10.5 K/uL   RBC 5.04  4.22 - 5.81 MIL/uL   Hemoglobin 16.1  13.0 - 17.0 g/dL   HCT 43.9  39.0 - 52.0 %   MCV 87.1  78.0 - 100.0 fL   MCH 31.9  26.0 - 34.0 pg   MCHC 36.6 (*) 30.0 - 36.0 g/dL   RDW 12.7  11.5 - 15.5 %   Platelets 218  150 - 400 K/uL  COMPREHENSIVE METABOLIC PANEL     Status: Abnormal   Collection Time    06/29/14  9:52 AM      Result Value Ref Range   Sodium 130 (*) 137 - 147 mEq/L   Potassium 5.4 (*) 3.7 - 5.3 mEq/L   Chloride 93 (*) 96 - 112 mEq/L   CO2 24  19 - 32 mEq/L   Glucose, Bld 90  70 - 99 mg/dL   BUN 6  6 - 23 mg/dL   Creatinine, Ser 0.70  0.50 - 1.35 mg/dL   Calcium 10.2  8.4 - 10.5 mg/dL   Total Protein 7.5  6.0 - 8.3 g/dL   Albumin 4.2  3.5 - 5.2 g/dL   AST 53 (*) 0 - 37 U/L   Comment: SLIGHT HEMOLYSIS     HEMOLYSIS AT THIS LEVEL MAY AFFECT RESULT   ALT 42  0 - 53 U/L   Alkaline Phosphatase 39  39 - 117 U/L   Total Bilirubin 0.3  0.3 - 1.2 mg/dL   GFR calc non Af Amer >90  >90 mL/min   GFR calc Af Amer >90  >90  mL/min   Comment: (NOTE)     The eGFR has been calculated using the CKD EPI equation.     This calculation has not been validated in all clinical situations.     eGFR's persistently <90 mL/min signify possible Chronic Kidney     Disease.   Anion gap 13  5 - 15  ETHANOL     Status: None   Collection Time    06/29/14  9:52 AM      Result Value Ref Range   Alcohol, Ethyl (B) <11  0 - 11 mg/dL   Comment:            LOWEST DETECTABLE LIMIT FOR     SERUM ALCOHOL IS 11 mg/dL     FOR MEDICAL PURPOSES ONLY  SALICYLATE LEVEL  Status: Abnormal   Collection Time    06/29/14  9:52 AM      Result Value Ref Range   Salicylate Lvl <1.6 (*) 2.8 - 20.0 mg/dL  CK TOTAL AND CKMB     Status: Abnormal   Collection Time    06/29/14  3:13 PM      Result Value Ref Range   Total CK 222  7 - 232 U/L   CK, MB 8.1 (*) 0.3 - 4.0 ng/mL   Comment: CRITICAL RESULT CALLED TO, READ BACK BY AND VERIFIED WITH:     A KHALID,RN 1728 06/29/14 WBOND   Relative Index 3.6 (*) 0.0 - 2.5   Comment: Performed at [facility] are reviewed and are pertinent for as above.  Current Facility-Administered Medications  Medication Dose Route Frequency Provider Last Rate Last Dose  . alum & mag hydroxide-simeth (MAALOX/MYLANTA) 200-200-20 MG/5ML suspension 30 mL  30 mL Oral PRN Mojeed Akintayo      . alum & mag hydroxide-simeth (MAALOX/MYLANTA) 200-200-20 MG/5ML suspension 30 mL  30 mL Oral Q4H PRN Mojeed Akintayo      . ibuprofen (ADVIL,MOTRIN) tablet 600 mg  600 mg Oral Q8H PRN Mojeed Akintayo      . LORazepam (ATIVAN) tablet 0-4 mg  0-4 mg Oral 4 times per day Mojeed Akintayo       Followed by  . [START ON 07/02/2014] LORazepam (ATIVAN) tablet 0-4 mg  0-4 mg Oral Q12H Mojeed Akintayo      . LORazepam (ATIVAN) tablet 1 mg  1 mg Oral Q8H PRN Mojeed Akintayo      . magnesium hydroxide (MILK OF MAGNESIA) suspension 30 mL  30 mL Oral Daily PRN Mojeed Akintayo      . [START ON 07/01/2014] nicotine (NICODERM CQ -  dosed in mg/24 hours) patch 21 mg  21 mg Transdermal Daily Mojeed Akintayo      . ondansetron (ZOFRAN) tablet 4 mg  4 mg Oral Q8H PRN Mojeed Akintayo      . zolpidem (AMBIEN) tablet 5 mg  5 mg Oral QHS PRN Mojeed Akintayo        Psychiatric Specialty Exam:     Blood pressure 132/86, pulse 80, temperature 98.3 F (36.8 C), temperature source Oral, resp. rate 16, height _0  (1.753 m), weight 63.504 kg (140 lb), SpO2 100.00%.Body mass index is 20.67 kg/(m^2).  General Appearance: Casual  Eye Contact::  Good  Speech:  Clear and Coherent  Volume:  Normal  Mood:  Euthymic  Affect:  Appropriate  Thought Process:  Goal Directed  Orientation:  Full (Time, Place, and Person)  Thought Content:  Negative  Suicidal Thoughts:  No  Homicidal Thoughts:  No  Memory:  Immediate;   Good Recent;   Good Remote;   Good  Judgement:  Fair  Insight:  Good and Shallow  Psychomotor Activity:  Normal  Concentration:  Good  Recall:  [place] of Knowledge:Good  Language: Good  Akathisia:  No  Handed:  Right  AIMS (if indicated):     Assets:  Communication Skills Desire for Improvement  Sleep:      Musculoskeletal: Strength & Muscle Tone: within normal limits Gait & Station: normal Patient leans: N/A  Treatment Plan Summary: -Discharge home with outpatient resources as assisted by Gershon Mussel with [facility] TTS department.   Benjamine Mola, FNP-BC 06/30/2014 2:51 PM

## 2014-06-30 NOTE — Plan of Care (Signed)
[facility] Observation Crisis Plan  Reason for Crisis Plan:  Substance Abuse   Plan of Care:  Referral for Substance Abuse  Family Support:    Mother  Current Living Environment:  Living Arrangements: Spouse/significant other; Pt lives with his mother, and can return to the household  Insurance:   [facility]   Name Acct ID Class Status Primary Coverage   Erich Montaneix, [place] W [id number] BEHAVIORAL HEALTH OBSERVATION Open MEDICARE - MEDICARE PART A AND B        Guarantor Account (for Hospital Account [id number])   Name Relation to Pt Service Area Active? Acct Type   Erich MontaneNix, Olegario W Self [facility] Yes Behavioral Health   Address Phone       [address] [place], [place] [id number] [phone](H)          Coverage Information (for Hospital Account [id number])   F/O Payor/Plan Precert #   MEDICARE/MEDICARE PART A AND B    Subscriber Subscriber #   Erich Montaneix, [place] W [id number]   Address Phone   [address] [place], [place] [id number]       Legal Guardian:   Self  Primary Care Provider:  No primary provider on file.  Current Outpatient Providers:  None  Psychiatrist:   None  Counselor/Therapist:   None  Compliant with Medications:  Yes  Additional Information: After consulting with Claudette Headonrad Withrow, NP it has been determined that pt does not present a life threatening danger to himself or others, and that psychiatric hospitalization is not indicated for him at this time.  Pt is interested in outpatient treatment for his substance abuse problems at this time.  He has problems with transportation, and would need a provider in his community.  Discharge instructions will include referral information for [facility] in [place].  Doylene Canninghomas Velvet Moomaw, MA Triage Specialist Raphael GibneyHughes, Sokha Craker Patrick 10/13/20153:45 PM

## 2014-06-30 NOTE — Discharge Summary (Signed)
[facility] OBS UNIT DISCHARGE SUMMARY   [name] is an 48 y.o. male. Total Time spent with patient: 45 minutes  Assessment: AXIS I:  Alcohol abuse; substance abuse; substance-induced mood disorder.  AXIS II:  Deferred AXIS III:   Past Medical History  Diagnosis Date  . Hepatitis C   . Myositis     Left deltoid biopsy at [facility] 10/11/11.  Marland Kitchen Cirrhosis   . Chronic pain syndrome   . 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 Alcohol abuse     Psychiatric admissions for alcohol and drug abuse  . History of cocaine abuse   . Depression     Multiple psychiatric admissions  . Tobacco abuse   . Tattoos   . Low TSH level January 2013    Normal free T4 of 1.06  . Back pain, chronic   . Acute renal failure     Secondary to rhabdo. Treated with dialysis short-term.   AXIS IV:  other psychosocial or environmental problems and problems related to social environment AXIS V:  61-70 mild symptoms  Plan:  No evidence of imminent risk to self or others at present.   Patient does not meet criteria for psychiatric inpatient admission. Supportive therapy provided about ongoing stressors. Refer to IOP. Discussed crisis plan, support from social network, calling 911, coming to the Emergency Department, and calling Suicide Hotline.  Subjective:   [name] is a 48 y.o. male patient admitted with alcohol intoxication. Pt denies SI, HI, and AVH, contracts for safety. Pt reports that he would "like to be with my own stuff in my own place and I didn't know it was going to be all open like this. Arnetha Gula can just send me home and I'll go to [facility] tomorrow. I'm fine, I just don't want to be in this Obs Unit here". Pt is upset about being in the OBS unit but is agreeable to speak to Tom with TTS for referrals before leaving. Dr. Dwyane Dee in agreement with pt leaving as well as he does not meet admission criteria as mentioned above nor he is in any withdrawals.   *Pt became increasingly agitated as he was discussing disposition  planning with Tom and other staff members. Pt in agreement to followup outpatient. Discharging now.   HPI:  [name] is a 48 year old male with past medical history of alcohol abuse, cocaine abuse, depression, hepatitis C, polymyositis, liver cirrhosis who presents to the ER today requesting alcohol detox. Patient states he has been an alcoholic since he was 10, and reports seeking help multiple times, however tends to relapse do to his chronic pain from polymyositis. Patient states he has poor followup with primary care, and has little to no ongoing care with his chronic pain. Patient states she's been referred to pain clinic in the past, however was turned away due to to drinking alcohol during the same day. Patient states he requests to get detoxed in order to obtain appropriate followup and treatment regarding his chronic medical problems. Patient also endorses occasional using cocaine. Patient states he only uses cocaine when he is drunk. He states his like to stop using cocaine as well, however states since he only uses it while he is drinking, he feels that alcohol detox will help him stop with that as well. Patient denies any signs or symptoms of withdrawal. Patient denies nausea, vomiting, headache, blurred vision, dizziness, abdominal pain, dysuria, chest pain, shortness of breath.  Patient does not endorse having any suicidal or homicidal ideation, he  denies delusional thinking, psychosis or depression.  HPI Elements:   Location:  alcohol dependence. Quality:  severe. Context:  self medication for pain.  Past Psychiatric History: Past Medical History  Diagnosis Date  . Hepatitis C   . Myositis     Left deltoid biopsy at [facility] 10/11/11.  Marland Kitchen Cirrhosis   . Chronic pain syndrome   . 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 Alcohol abuse     Psychiatric admissions for alcohol and drug abuse  . History of cocaine abuse   . Depression     Multiple psychiatric admissions  . Tobacco abuse   . Tattoos   .  Low TSH level January 2013    Normal free T4 of 1.06  . Back pain, chronic   . Acute renal failure     Secondary to rhabdo. Treated with dialysis short-term.    reports that he has been smoking Cigarettes.  He has a 40 pack-year smoking history. He uses smokeless tobacco. He reports that he drinks alcohol. He reports that he uses illicit drugs (Cocaine and Marijuana). History reviewed. No pertinent family history.   Living Arrangements: Spouse/significant other   Abuse/Neglect [facility]) Physical Abuse: Denies Verbal Abuse: Denies Sexual Abuse: Denies Allergies:   Allergies  Allergen Reactions  . Acetaminophen Other (See Comments)    Liver disease     ACT Assessment Complete:  Yes:    Educational Status    Risk to Self: Risk to self with the past 6 months Is patient at risk for suicide?: No Substance abuse history and/or treatment for substance abuse?: Yes  Risk to Others:    Abuse: Abuse/Neglect Assessment (Assessment to be complete while patient is alone) Physical Abuse: Denies Verbal Abuse: Denies Sexual Abuse: Denies Exploitation of patient/patient's resources: Denies Self-Neglect: Denies  Prior Inpatient Therapy:    Prior Outpatient Therapy:    Additional Information:        Objective: Blood pressure 132/86, pulse 80, temperature 98.3 F (36.8 C), temperature source Oral, resp. rate 16, height _0  (1.753 m), weight 63.504 kg (140 lb), SpO2 100.00%.Body mass index is 20.67 kg/(m^2). Results for orders placed during the hospital encounter of 06/29/14 (from the past 72 hour(s))  URINE RAPID DRUG SCREEN (HOSP PERFORMED)     Status: Abnormal   Collection Time    06/29/14  9:02 AM      Result Value Ref Range   Opiates NONE DETECTED  NONE DETECTED   Cocaine POSITIVE (*) NONE DETECTED   Benzodiazepines NONE DETECTED  NONE DETECTED   Amphetamines NONE DETECTED  NONE DETECTED   Tetrahydrocannabinol NONE DETECTED  NONE DETECTED   Barbiturates NONE DETECTED  NONE DETECTED    Comment:            DRUG SCREEN FOR MEDICAL PURPOSES     ONLY.  IF CONFIRMATION IS NEEDED     FOR ANY PURPOSE, NOTIFY LAB     WITHIN 5 DAYS.                LOWEST DETECTABLE LIMITS     FOR URINE DRUG SCREEN     Drug Class       Cutoff (ng/mL)     Amphetamine      1000     Barbiturate      200     Benzodiazepine   017     Tricyclics       494     Opiates          300  Cocaine          300     THC              50  ACETAMINOPHEN LEVEL     Status: None   Collection Time    06/29/14  9:52 AM      Result Value Ref Range   Acetaminophen (Tylenol), Serum <15.0  10 - 30 ug/mL   Comment:            THERAPEUTIC CONCENTRATIONS VARY     SIGNIFICANTLY. A RANGE OF 10-30     ug/mL MAY BE AN EFFECTIVE     CONCENTRATION FOR MANY PATIENTS.     HOWEVER, SOME ARE BEST TREATED     AT CONCENTRATIONS OUTSIDE THIS     RANGE.     ACETAMINOPHEN CONCENTRATIONS     >150 ug/mL AT 4 HOURS AFTER     INGESTION AND >50 ug/mL AT 12     HOURS AFTER INGESTION ARE     OFTEN ASSOCIATED WITH TOXIC     REACTIONS.  CBC     Status: Abnormal   Collection Time    06/29/14  9:52 AM      Result Value Ref Range   WBC 8.0  4.0 - 10.5 K/uL   RBC 5.04  4.22 - 5.81 MIL/uL   Hemoglobin 16.1  13.0 - 17.0 g/dL   HCT 43.9  39.0 - 52.0 %   MCV 87.1  78.0 - 100.0 fL   MCH 31.9  26.0 - 34.0 pg   MCHC 36.6 (*) 30.0 - 36.0 g/dL   RDW 12.7  11.5 - 15.5 %   Platelets 218  150 - 400 K/uL  COMPREHENSIVE METABOLIC PANEL     Status: Abnormal   Collection Time    06/29/14  9:52 AM      Result Value Ref Range   Sodium 130 (*) 137 - 147 mEq/L   Potassium 5.4 (*) 3.7 - 5.3 mEq/L   Chloride 93 (*) 96 - 112 mEq/L   CO2 24  19 - 32 mEq/L   Glucose, Bld 90  70 - 99 mg/dL   BUN 6  6 - 23 mg/dL   Creatinine, Ser 0.70  0.50 - 1.35 mg/dL   Calcium 10.2  8.4 - 10.5 mg/dL   Total Protein 7.5  6.0 - 8.3 g/dL   Albumin 4.2  3.5 - 5.2 g/dL   AST 53 (*) 0 - 37 U/L   Comment: SLIGHT HEMOLYSIS     HEMOLYSIS AT THIS LEVEL MAY AFFECT  RESULT   ALT 42  0 - 53 U/L   Alkaline Phosphatase 39  39 - 117 U/L   Total Bilirubin 0.3  0.3 - 1.2 mg/dL   GFR calc non Af Amer >90  >90 mL/min   GFR calc Af Amer >90  >90 mL/min   Comment: (NOTE)     The eGFR has been calculated using the CKD EPI equation.     This calculation has not been validated in all clinical situations.     eGFR's persistently <90 mL/min signify possible Chronic Kidney     Disease.   Anion gap 13  5 - 15  ETHANOL     Status: None   Collection Time    06/29/14  9:52 AM      Result Value Ref Range   Alcohol, Ethyl (B) <11  0 - 11 mg/dL   Comment:  LOWEST DETECTABLE LIMIT FOR     SERUM ALCOHOL IS 11 mg/dL     FOR MEDICAL PURPOSES ONLY  SALICYLATE LEVEL     Status: Abnormal   Collection Time    06/29/14  9:52 AM      Result Value Ref Range   Salicylate Lvl <2.3 (*) 2.8 - 20.0 mg/dL  CK TOTAL AND CKMB     Status: Abnormal   Collection Time    06/29/14  3:13 PM      Result Value Ref Range   Total CK 222  7 - 232 U/L   CK, MB 8.1 (*) 0.3 - 4.0 ng/mL   Comment: CRITICAL RESULT CALLED TO, READ BACK BY AND VERIFIED WITH:     A KHALID,RN 1728 06/29/14 WBOND   Relative Index 3.6 (*) 0.0 - 2.5   Comment: Performed at [facility] are reviewed and are pertinent for as above.  Current Facility-Administered Medications  Medication Dose Route Frequency Provider Last Rate Last Dose  . alum & mag hydroxide-simeth (MAALOX/MYLANTA) 200-200-20 MG/5ML suspension 30 mL  30 mL Oral PRN Mojeed Akintayo      . alum & mag hydroxide-simeth (MAALOX/MYLANTA) 200-200-20 MG/5ML suspension 30 mL  30 mL Oral Q4H PRN Mojeed Akintayo      . ibuprofen (ADVIL,MOTRIN) tablet 600 mg  600 mg Oral Q8H PRN Mojeed Akintayo      . LORazepam (ATIVAN) tablet 0-4 mg  0-4 mg Oral 4 times per day Mojeed Akintayo       Followed by  . [START ON 07/02/2014] LORazepam (ATIVAN) tablet 0-4 mg  0-4 mg Oral Q12H Mojeed Akintayo      . LORazepam (ATIVAN) tablet 1 mg  1 mg Oral  Q8H PRN Mojeed Akintayo      . magnesium hydroxide (MILK OF MAGNESIA) suspension 30 mL  30 mL Oral Daily PRN Mojeed Akintayo      . [START ON 07/01/2014] nicotine (NICODERM CQ - dosed in mg/24 hours) patch 21 mg  21 mg Transdermal Daily Mojeed Akintayo      . ondansetron (ZOFRAN) tablet 4 mg  4 mg Oral Q8H PRN Mojeed Akintayo      . zolpidem (AMBIEN) tablet 5 mg  5 mg Oral QHS PRN Mojeed Akintayo        Psychiatric Specialty Exam:     Blood pressure 132/86, pulse 80, temperature 98.3 F (36.8 C), temperature source Oral, resp. rate 16, height _0  (1.753 m), weight 63.504 kg (140 lb), SpO2 100.00%.Body mass index is 20.67 kg/(m^2).  General Appearance: Casual  Eye Contact::  Good  Speech:  Clear and Coherent  Volume:  Normal  Mood:  Euthymic  Affect:  Appropriate  Thought Process:  Goal Directed  Orientation:  Full (Time, Place, and Person)  Thought Content:  Negative  Suicidal Thoughts:  No  Homicidal Thoughts:  No  Memory:  Immediate;   Good Recent;   Good Remote;   Good  Judgement:  Fair  Insight:  Good and Shallow  Psychomotor Activity:  Normal  Concentration:  Good  Recall:  [place] of Knowledge:Good  Language: Good  Akathisia:  No  Handed:  Right  AIMS (if indicated):     Assets:  Communication Skills Desire for Improvement  Sleep:      Musculoskeletal: Strength & Muscle Tone: within normal limits Gait & Station: normal Patient leans: N/A  Treatment Plan Summary: -Discharge home with outpatient resources as assisted by Gershon Mussel with [facility] TTS department.  Benjamine Mola, FNP-BC 06/30/2014 2:57 PM

## 2014-06-30 NOTE — Consult Note (Signed)
[facility] Face-to-Face Psychiatry Consult   Reason for Consult:  Alcohol detoxification Referring Physician: EDP [name] is an 48 y.o. male. Total Time spent with patient: 45 minutes  Assessment: AXIS I:  Alcohol dependence  AXIS II:  Deferred AXIS III:   Past Medical History  Diagnosis Date  . Hepatitis C   . Myositis     Left deltoid biopsy at [facility] 10/11/11.  Marland Kitchen Cirrhosis   . Chronic pain syndrome   . 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 Alcohol abuse     Psychiatric admissions for alcohol and drug abuse  . History of cocaine abuse   . Depression     Multiple psychiatric admissions  . Tobacco abuse   . Tattoos   . Low TSH level January 2013    Normal free T4 of 1.06  . Back pain, chronic   . Acute renal failure     Secondary to rhabdo. Treated with dialysis short-term.   AXIS IV:  other psychosocial or environmental problems and problems related to social environment AXIS V:  51-60 moderate symptoms  Plan:  No evidence of imminent risk to self or others at present.   Patient referred to [facility] OBS unit  Subjective:   [name] is a 48 y.o. male patient admitted with alcohol intoxication.  HPI:  [name] is a 48 year old male with past medical history of alcohol abuse, cocaine abuse, depression, hepatitis C, polymyositis, liver cirrhosis who presents to the ER today requesting alcohol detox. Patient states he has been an alcoholic since he was 15, and reports seeking help multiple times, however tends to relapse do to his chronic pain from polymyositis. Patient states he has poor followup with primary care, and has little to no ongoing care with his chronic pain. Patient states she's been referred to pain clinic in the past, however was turned away due to to drinking alcohol during the same day. Patient states he requests to get detoxed in order to obtain appropriate followup and treatment regarding his chronic medical problems. Patient also endorses occasional using cocaine.  Patient states he only uses cocaine when he is drunk. He states his like to stop using cocaine as well, however states since he only uses it while he is drinking, he feels that alcohol detox will help him stop with that as well. Patient denies any signs or symptoms of withdrawal. Patient denies nausea, vomiting, headache, blurred vision, dizziness, abdominal pain, dysuria, chest pain, shortness of breath.  Patient does not endorse having any suicidal or homicidal ideation, he denies delusional thinking, psychosis or depression.  HPI Elements:   Location:  alcohol dependence. Quality:  severe. Context:  self medication for pain.  Past Psychiatric History: Past Medical History  Diagnosis Date  . Hepatitis C   . Myositis     Left deltoid biopsy at [facility] 10/11/11.  Marland Kitchen Cirrhosis   . Chronic pain syndrome   . 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 Alcohol abuse     Psychiatric admissions for alcohol and drug abuse  . History of cocaine abuse   . Depression     Multiple psychiatric admissions  . Tobacco abuse   . Tattoos   . Low TSH level January 2013    Normal free T4 of 1.06  . Back pain, chronic   . Acute renal failure     Secondary to rhabdo. Treated with dialysis short-term.    reports that he has been smoking Cigarettes.  He has a 40 pack-year smoking history.  He uses smokeless tobacco. He reports that he drinks alcohol. He reports that he uses illicit drugs (Cocaine and Marijuana). No family history on file. Family History Substance Abuse: No Family Supports: Yes, List: Living Arrangements: Spouse/significant other Can pt return to current living arrangement?: Yes Abuse/Neglect [facility]) Physical Abuse: Denies Verbal Abuse: Denies Sexual Abuse: Denies Allergies:   Allergies  Allergen Reactions  . Acetaminophen Other (See Comments)    Liver disease     ACT Assessment Complete:  Yes:    Educational Status    Risk to Self: Risk to self with the past 6 months Suicidal Ideation:  No Suicidal Intent: No Is patient at risk for suicide?: No Suicidal Plan?: No Access to Means: No What has been your use of drugs/alcohol within the last 12 months?:  (n/a) Previous Attempts/Gestures: No How many times?:  (0) Other Self Harm Risks:  (n/a) Intentional Self Injurious Behavior: None Family Suicide History: No Recent stressful life event(s): Other (Comment) (chronic pain issues, health, alcoholism) Persecutory voices/beliefs?: No Depression: No Substance abuse history and/or treatment for substance abuse?: Yes Suicide prevention information given to non-admitted patients: Not applicable  Risk to Others: Risk to Others within the past 6 months Homicidal Ideation: No Thoughts of Harm to Others: No Current Homicidal Intent: No Current Homicidal Plan: No Access to Homicidal Means: No Identified Victim:  (n/a) History of harm to others?: No Assessment of Violence: None Noted Violent Behavior Description:  (patient is calm and cooperative ) Does patient have access to weapons?: No Criminal Charges Pending?: Yes Describe Pending Criminal Charges:  (DUI (pt reports a total of 9 DUI's)) Does patient have a court date: No  Abuse: Abuse/Neglect Assessment (Assessment to be complete while patient is alone) Physical Abuse: Denies Verbal Abuse: Denies Sexual Abuse: Denies Exploitation of patient/patient's resources: Denies Self-Neglect: Denies  Prior Inpatient Therapy: Prior Inpatient Therapy Prior Inpatient Therapy: Yes Prior Therapy Dates:  (multiple x's in the past ) Prior Therapy Facilty/Provider(s):  [facility], [facility], [facility]) Reason for Treatment:  (alcohol detox and rehabilitation )  Prior Outpatient Therapy: Prior Outpatient Therapy Prior Outpatient Therapy: No Prior Therapy Dates:  (n/a) Prior Therapy Facilty/Provider(s):  (n/a) Reason for Treatment:  (n/a)  Additional Information: Additional Information 1:1 In Past 12 Months?:  No CIRT Risk: No Elopement Risk: No Does patient have medical clearance?: Yes                  Objective: Blood pressure 121/92, pulse 77, temperature 97.7 F (36.5 C), temperature source Oral, resp. rate 20, SpO2 98.00%.There is no weight on file to calculate BMI. Results for orders placed during the hospital encounter of 06/29/14 (from the past 72 hour(s))  URINE RAPID DRUG SCREEN (HOSP PERFORMED)     Status: Abnormal   Collection Time    06/29/14  9:02 AM      Result Value Ref Range   Opiates NONE DETECTED  NONE DETECTED   Cocaine POSITIVE (*) NONE DETECTED   Benzodiazepines NONE DETECTED  NONE DETECTED   Amphetamines NONE DETECTED  NONE DETECTED   Tetrahydrocannabinol NONE DETECTED  NONE DETECTED   Barbiturates NONE DETECTED  NONE DETECTED   Comment:            DRUG SCREEN FOR MEDICAL PURPOSES     ONLY.  IF CONFIRMATION IS NEEDED     FOR ANY PURPOSE, NOTIFY LAB     WITHIN 5 DAYS.  LOWEST DETECTABLE LIMITS     FOR URINE DRUG SCREEN     Drug Class       Cutoff (ng/mL)     Amphetamine      1000     Barbiturate      200     Benzodiazepine   500     Tricyclics       370     Opiates          300     Cocaine          300     THC              50  ACETAMINOPHEN LEVEL     Status: None   Collection Time    06/29/14  9:52 AM      Result Value Ref Range   Acetaminophen (Tylenol), Serum <15.0  10 - 30 ug/mL   Comment:            THERAPEUTIC CONCENTRATIONS VARY     SIGNIFICANTLY. A RANGE OF 10-30     ug/mL MAY BE AN EFFECTIVE     CONCENTRATION FOR MANY PATIENTS.     HOWEVER, SOME ARE BEST TREATED     AT CONCENTRATIONS OUTSIDE THIS     RANGE.     ACETAMINOPHEN CONCENTRATIONS     >150 ug/mL AT 4 HOURS AFTER     INGESTION AND >50 ug/mL AT 12     HOURS AFTER INGESTION ARE     OFTEN ASSOCIATED WITH TOXIC     REACTIONS.  CBC     Status: Abnormal   Collection Time    06/29/14  9:52 AM      Result Value Ref Range   WBC 8.0  4.0 - 10.5 K/uL   RBC  5.04  4.22 - 5.81 MIL/uL   Hemoglobin 16.1  13.0 - 17.0 g/dL   HCT 43.9  39.0 - 52.0 %   MCV 87.1  78.0 - 100.0 fL   MCH 31.9  26.0 - 34.0 pg   MCHC 36.6 (*) 30.0 - 36.0 g/dL   RDW 12.7  11.5 - 15.5 %   Platelets 218  150 - 400 K/uL  COMPREHENSIVE METABOLIC PANEL     Status: Abnormal   Collection Time    06/29/14  9:52 AM      Result Value Ref Range   Sodium 130 (*) 137 - 147 mEq/L   Potassium 5.4 (*) 3.7 - 5.3 mEq/L   Chloride 93 (*) 96 - 112 mEq/L   CO2 24  19 - 32 mEq/L   Glucose, Bld 90  70 - 99 mg/dL   BUN 6  6 - 23 mg/dL   Creatinine, Ser 0.70  0.50 - 1.35 mg/dL   Calcium 10.2  8.4 - 10.5 mg/dL   Total Protein 7.5  6.0 - 8.3 g/dL   Albumin 4.2  3.5 - 5.2 g/dL   AST 53 (*) 0 - 37 U/L   Comment: SLIGHT HEMOLYSIS     HEMOLYSIS AT THIS LEVEL MAY AFFECT RESULT   ALT 42  0 - 53 U/L   Alkaline Phosphatase 39  39 - 117 U/L   Total Bilirubin 0.3  0.3 - 1.2 mg/dL   GFR calc non Af Amer >90  >90 mL/min   GFR calc Af Amer >90  >90 mL/min   Comment: (NOTE)     The eGFR has been calculated using the CKD EPI equation.     This calculation  has not been validated in all clinical situations.     eGFR's persistently <90 mL/min signify possible Chronic Kidney     Disease.   Anion gap 13  5 - 15  ETHANOL     Status: None   Collection Time    06/29/14  9:52 AM      Result Value Ref Range   Alcohol, Ethyl (B) <11  0 - 11 mg/dL   Comment:            LOWEST DETECTABLE LIMIT FOR     SERUM ALCOHOL IS 11 mg/dL     FOR MEDICAL PURPOSES ONLY  SALICYLATE LEVEL     Status: Abnormal   Collection Time    06/29/14  9:52 AM      Result Value Ref Range   Salicylate Lvl <3.0 (*) 2.8 - 20.0 mg/dL  CK TOTAL AND CKMB     Status: Abnormal   Collection Time    06/29/14  3:13 PM      Result Value Ref Range   Total CK 222  7 - 232 U/L   CK, MB 8.1 (*) 0.3 - 4.0 ng/mL   Comment: CRITICAL RESULT CALLED TO, READ BACK BY AND VERIFIED WITH:     A KHALID,RN 1728 06/29/14 WBOND   Relative Index 3.6 (*)  0.0 - 2.5   Comment: Performed at [facility] are reviewed and are pertinent for as above.  Current Facility-Administered Medications  Medication Dose Route Frequency Provider Last Rate Last Dose  . alum & mag hydroxide-simeth (MAALOX/MYLANTA) 200-200-20 MG/5ML suspension 30 mL  30 mL Oral PRN Carrie Mew, PA-C      . ibuprofen (ADVIL,MOTRIN) tablet 600 mg  600 mg Oral Q8H PRN Carrie Mew, PA-C      . LORazepam (ATIVAN) tablet 0-4 mg  0-4 mg Oral 4 times per day Carrie Mew, PA-C   1 mg at 06/30/14 0323   Followed by  . [START ON 07/01/2014] LORazepam (ATIVAN) tablet 0-4 mg  0-4 mg Oral Q12H Carrie Mew, PA-C      . LORazepam (ATIVAN) tablet 1 mg  1 mg Oral Q8H PRN Carrie Mew, PA-C   1 mg at 06/30/14 0800  . nicotine (NICODERM CQ - dosed in mg/24 hours) patch 21 mg  21 mg Transdermal Daily Carrie Mew, PA-C      . ondansetron [facility]) tablet 4 mg  4 mg Oral Q8H PRN Carrie Mew, PA-C      . oxyCODONE-acetaminophen (PERCOCET/ROXICET) 5-325 MG per tablet 1 tablet  1 tablet Oral Q6H PRN Elwyn Lade, PA-C   1 tablet at 06/30/14 [phone]  . sodium polystyrene (KAYEXALATE) 15 GM/60ML suspension 30 g  30 g Oral Once Hannah S Merrell, PA-C      . thiamine (VITAMIN B-1) tablet 100 mg  100 mg Oral Daily Carrie Mew, PA-C   100 mg at 06/30/14 1009   Or  . thiamine (B-1) injection 100 mg  100 mg Intravenous Daily Carrie Mew, PA-C      . zolpidem [facility]) tablet 5 mg  5 mg Oral QHS PRN Carrie Mew, PA-C       Current Outpatient Prescriptions  Medication Sig Dispense Refill  . ibuprofen (ADVIL,MOTRIN) 200 MG tablet Take 800 mg by mouth every 6 (six) hours as needed for moderate pain.        Psychiatric Specialty Exam:     Blood pressure 121/92, pulse  77, temperature 97.7 F (36.5 C), temperature source Oral, resp. rate 20, SpO2 98.00%.There is no weight on file to calculate BMI.  General Appearance: Casual  Eye Contact::  Good  Speech:  Clear and  Coherent  Volume:  Normal  Mood:  Euthymic  Affect:  Appropriate  Thought Process:  Goal Directed  Orientation:  Full (Time, Place, and Person)  Thought Content:  Negative  Suicidal Thoughts:  No  Homicidal Thoughts:  No  Memory:  Immediate;   Good Recent;   Good Remote;   Good  Judgement:  Impaired  Insight:  Shallow  Psychomotor Activity:  Normal  Concentration:  Fair  Recall:  [place] of Knowledge:Good  Language: Good  Akathisia:  No  Handed:  Right  AIMS (if indicated):     Assets:  Communication Skills Desire for Improvement  Sleep:      Musculoskeletal: Strength & Muscle Tone: within normal limits Gait & Station: normal Patient leans: N/A  Treatment Plan Summary: Daily contact with patient to assess and evaluate symptoms and progress in treatment Medication management Patient referred to OBS unit for 24 hours observation  Corena Pilgrim, MD 06/30/2014 11:16 AM

## 2014-06-30 NOTE — Progress Notes (Signed)
Patient ID: Ryan MontaneJames W Good, male   DOB: 10-18-1965, 48 y.o.   MRN: [id number] nursing admission note: D: pt came to the observation unit voluntarily from [facility] for [facility] from [facility]. He was  not having any type of withdrawal symptoms on admission to the observation unit. He denied any si/hi/av on admission and stated he did not want to stay and wanted "immediate discharge". He was assessed by RN, extender and tts. Extender will find him some follow up . He had no complaints of pain on admission and no complaints other than he wanted to go home. He has a medical hx  Hep C, myositis, cirrhosis, chronic pain, hx of cocaine abuse, depression, acute renal failure, hernia repair and an appendectomy. He refused any food/drink and remains anxious and repeatedly stated he is ready to go home.

## 2014-06-30 NOTE — Progress Notes (Signed)
Patient ID: [name], male   DOB: 1966-06-02, 48 y.o.   MRN: [id number] 06-30-14 @ 1523 nursing discharge note; D: pt stated he is ready for discharge. He denies si/hi//av. He stated he understood his discharge instructions and signed for his belongings. He stated his cousin will be transporting him home.

## 2014-07-01 NOTE — ED Provider Notes (Signed)
Medical screening examination/treatment/procedure(s) were conducted as a shared visit with non-physician practitioner(s) and myself.  I personally evaluated the patient during the encounter.   EKG Interpretation   Date/Time:  Monday June 29 2014 18:39:23 EDT Ventricular Rate:  71 PR Interval:  188 QRS Duration: 82 QT Interval:  374 QTC Calculation: 406 R Axis:   85 Text Interpretation:  Normal sinus rhythm Anteroseptal infarct , age  undetermined Abnormal ECG No significant change since last tracing  Confirmed by POLLINA  MD, CHRISTOPHER [phone]) on 06/29/2014 6:57:57 PM      Pt in the ED for detox.  Labs showed mild hyperkalemia but otherwise unremarkable.    Non toxic appearing.  Not tremulous or diaphoretic.    Does not appear to be in DTs.  Stable for psych assessment , eval.  Linwood DibblesJon Samie Reasons, MD 07/01/14 1540

## 2014-07-02 NOTE — ED Provider Notes (Signed)
Medical screening examination/treatment/procedure(s) were performed by non-physician practitioner and as supervising physician I was immediately available for consultation/collaboration.   EKG Interpretation   Date/Time:  Monday June 29 2014 18:39:23 EDT Ventricular Rate:  71 PR Interval:  188 QRS Duration: 82 QT Interval:  374 QTC Calculation: 406 R Axis:   85 Text Interpretation:  Normal sinus rhythm Anteroseptal infarct , age  undetermined Abnormal ECG No significant change since last tracing  Confirmed by [facility]  MD, Lydiah Pong [phone] on 06/29/2014 6:57:57 PM        Ryan Creasehristopher J. Dallis Czaja, MD 07/02/14 [phone]

## 2014-07-05 NOTE — H&P (Signed)
Case discussed,agree with treatment plan

## 2014-07-05 NOTE — Discharge Summary (Signed)
Case discussed,agree with treatment plan 

## 2014-07-24 ENCOUNTER — Emergency Department (HOSPITAL_BASED_OUTPATIENT_CLINIC_OR_DEPARTMENT_OTHER)
Admission: EM | Admit: 2014-07-24 | Discharge: 2014-07-24 | Disposition: A | Discharge status: Home / Self Care | Payer: Medicare Other | Attending: Emergency Medicine | Admitting: Emergency Medicine

## 2014-07-24 ENCOUNTER — Encounter (HOSPITAL_BASED_OUTPATIENT_CLINIC_OR_DEPARTMENT_OTHER): Payer: Self-pay

## 2014-07-24 DIAGNOSIS — R748 Abnormal levels of other serum enzymes: Secondary | ICD-10-CM | POA: Diagnosis not present

## 2014-07-24 DIAGNOSIS — M791 Myalgia, unspecified site: Secondary | ICD-10-CM

## 2014-07-24 DIAGNOSIS — Z720 Tobacco use: Secondary | ICD-10-CM | POA: Insufficient documentation

## 2014-07-24 DIAGNOSIS — Z8619 Personal history of other infectious and parasitic diseases: Secondary | ICD-10-CM | POA: Diagnosis not present

## 2014-07-24 DIAGNOSIS — G894 Chronic pain syndrome: Secondary | ICD-10-CM | POA: Insufficient documentation

## 2014-07-24 DIAGNOSIS — Z87448 Personal history of other diseases of urinary system: Secondary | ICD-10-CM | POA: Diagnosis not present

## 2014-07-24 DIAGNOSIS — Z8719 Personal history of other diseases of the digestive system: Secondary | ICD-10-CM | POA: Insufficient documentation

## 2014-07-24 DIAGNOSIS — Z8639 Personal history of other endocrine, nutritional and metabolic disease: Secondary | ICD-10-CM | POA: Diagnosis not present

## 2014-07-24 DIAGNOSIS — Z8659 Personal history of other mental and behavioral disorders: Secondary | ICD-10-CM | POA: Diagnosis not present

## 2014-07-24 DIAGNOSIS — Z791 Long term (current) use of non-steroidal anti-inflammatories (NSAID): Secondary | ICD-10-CM | POA: Diagnosis not present

## 2014-07-24 DIAGNOSIS — M79604 Pain in right leg: Secondary | ICD-10-CM | POA: Diagnosis present

## 2014-07-24 LAB — URINALYSIS, ROUTINE W REFLEX MICROSCOPIC
Bilirubin Urine: NEGATIVE
Glucose, UA: NEGATIVE mg/dL
Hgb urine dipstick: NEGATIVE
KETONES UR: NEGATIVE mg/dL
Nitrite: NEGATIVE
PROTEIN: NEGATIVE mg/dL
Specific Gravity, Urine: 1.017 (ref 1.005–1.030)
UROBILINOGEN UA: 0.2 mg/dL (ref 0.0–1.0)
pH: 6 (ref 5.0–8.0)

## 2014-07-24 LAB — URINE MICROSCOPIC-ADD ON

## 2014-07-24 LAB — CK: Total CK: 248 U/L — ABNORMAL HIGH (ref 7–232)

## 2014-07-24 MED ORDER — SODIUM CHLORIDE 0.9 % IV BOLUS (SEPSIS)
1000.0000 mL | Freq: Once | INTRAVENOUS | Status: AC
  Administered 2014-07-24: 1000 mL via INTRAVENOUS

## 2014-07-24 MED ORDER — SODIUM CHLORIDE 0.9 % IV BOLUS (SEPSIS): 1000.0000 mL | Freq: Once | INTRAVENOUS | Status: DC

## 2014-07-24 MED ORDER — HYDROMORPHONE HCL 1 MG/ML IJ SOLN
1.0000 mg | Freq: Once | INTRAMUSCULAR | Status: AC
  Administered 2014-07-24: 1 mg via INTRAVENOUS
  Filled 2014-07-24: qty 1

## 2014-07-24 MED ORDER — TRAMADOL HCL 50 MG PO TABS: 50.0000 mg | ORAL_TABLET | Freq: Four times a day (QID) | ORAL | Status: DC | PRN

## 2014-07-24 NOTE — ED Notes (Signed)
Pt reports bilateral leg pain. Reports pain x 12-13 days.

## 2014-07-24 NOTE — Discharge Instructions (Signed)
As discussed, it is very important that you monitor your condition carefully, drink plenty of fluids, and do not hesitate to return for concerning changes in your condition.

## 2014-07-24 NOTE — ED Provider Notes (Signed)
CSN: [id number]     Arrival date & time 07/24/14  1557 History   First MD Initiated Contact with Patient 07/24/14 1628     Chief Complaint  Patient presents with  . Leg Pain     (Consider location/radiation/quality/duration/timing/severity/associated sxs/prior Treatment) HPI  Patient presents with concerns of ongoing, increasing lower extremity pain.  Patient has a history of myositis. Over the past days the pain is become more severe diffusely symmetrically throughout both legs. Pain is typical for him. No new loss of sensation in either extremity.  Pain is worse with motion of the distal extremity in both legs. There is no other new pain. Patient has only OTC medication currently for pain relief. Patient is currently in an inpatient therapy Center for alcohol addiction. Patient notes prior exacerbations of pain have been accompanied with evidence for rhabdomyolysis, kidney injury.  Past Medical History  Diagnosis Date  . Hepatitis C   . Myositis     [facility]  Marland Kitchen. Cirrhosis   . Chronic pain syndrome   . 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 Alcohol abuse     Psychiatric admissions for alcohol and drug abuse  . History of cocaine abuse   . Depression     Multiple psychiatric admissions  . Tobacco abuse   . Tattoos   . Low TSH level January 2013    Normal free T4 of 1.06  . Back pain, chronic   . Acute renal failure     Secondary to rhabdo. Treated with dialysis short-term.   Past Surgical History  Procedure Laterality Date  . Hernia repair    . Muscle biopsy    . Appendectomy     No family history on file. History  Substance Use Topics  . Smoking status: Current Every Day Smoker -- 1.00 packs/day for 40 years    Types: Cigarettes  . Smokeless tobacco: Current User  . Alcohol Use: 0.0 oz/week     Comment: last use of beer yesterday.     Review of Systems  Constitutional:       Per HPI, otherwise negative  HENT:       Per HPI, otherwise  negative  Respiratory:       Per HPI, otherwise negative  Cardiovascular:       Per HPI, otherwise negative  Gastrointestinal: Negative for vomiting.  Endocrine:       Negative aside from HPI  Genitourinary:       Neg aside from HPI   Musculoskeletal:       Per HPI, otherwise negative  Skin: Negative.   Neurological: Negative for syncope.      Allergies  Acetaminophen  Home Medications   Prior to Admission medications   Medication Sig Start Date End Date Taking? Authorizing Provider  ibuprofen (ADVIL,MOTRIN) 200 MG tablet Take 800 mg by mouth every 6 (six) hours as needed for moderate pain.    Historical Provider, MD   There were no vitals taken for this visit. Physical Exam  Constitutional: He is oriented to person, place, and time. He appears well-developed. No distress.  HENT:  Head: Normocephalic and atraumatic.  Eyes: Conjunctivae and EOM are normal.  Cardiovascular: Normal rate and regular rhythm.   Pulmonary/Chest: Effort normal. No stridor. No respiratory distress.  Abdominal: He exhibits no distension.  Musculoskeletal: He exhibits no edema.  Patient can flex and extend both hips, knees, ankles, though he describes pain in both thighs and calves with any motion of either leg.  No superficial changes, no gross deformities.   Neurological: He is alert and oriented to person, place, and time.  Skin: Skin is warm and dry.  Psychiatric: He has a normal mood and affect.  Nursing note and vitals reviewed.   ED Course  Procedures (including critical care time) Labs Review Labs Reviewed  CK - Abnormal; Notable for the following:    Total CK 248 (*)    All other components within normal limits  URINALYSIS, ROUTINE W REFLEX MICROSCOPIC - Abnormal; Notable for the following:    Leukocytes, UA TRACE (*)    All other components within normal limits  URINE MICROSCOPIC-ADD ON    I reviewed the patient's chart. No evidence for muscle biopsy that the patient states  happened several years ago. As such, patient seemingly has myositis, without clear differentiation.  6:43 PM Patient appears better, states that he feels better.  IV fluids running. CK is similar to multiple prior studies for him.  MDM  Patient presents to 2 increasing leg pain.  Patient has a history of myositis, and today's evaluation demonstrates mildly elevated CK. Patient received IV fluids, analgesics, with substantial reduction in his pain.  No evidence for other acute pathology.  Patient was discharged in stable condition.    Gerhard Munchobert Marilyne Haseley, MD 07/24/14 1843

## 2014-08-10 ENCOUNTER — Ambulatory Visit: Payer: Medicare Other | Attending: Internal Medicine | Admitting: Internal Medicine

## 2014-08-10 ENCOUNTER — Ambulatory Visit (HOSPITAL_BASED_OUTPATIENT_CLINIC_OR_DEPARTMENT_OTHER): Payer: Medicare Other

## 2014-08-10 ENCOUNTER — Encounter: Payer: Self-pay | Admitting: Internal Medicine

## 2014-08-10 VITALS — BP 155/94 | HR 80 | Temp 98.0°F | Resp 16 | Wt 150.0 lb

## 2014-08-10 DIAGNOSIS — Z79891 Long term (current) use of opiate analgesic: Secondary | ICD-10-CM | POA: Insufficient documentation

## 2014-08-10 DIAGNOSIS — M79669 Pain in unspecified lower leg: Secondary | ICD-10-CM | POA: Diagnosis not present

## 2014-08-10 DIAGNOSIS — F1099 Alcohol use, unspecified with unspecified alcohol-induced disorder: Secondary | ICD-10-CM | POA: Insufficient documentation

## 2014-08-10 DIAGNOSIS — B1920 Unspecified viral hepatitis C without hepatic coma: Secondary | ICD-10-CM | POA: Diagnosis not present

## 2014-08-10 DIAGNOSIS — IMO0001 Reserved for inherently not codable concepts without codable children: Secondary | ICD-10-CM

## 2014-08-10 DIAGNOSIS — M79606 Pain in leg, unspecified: Secondary | ICD-10-CM

## 2014-08-10 DIAGNOSIS — Z8619 Personal history of other infectious and parasitic diseases: Secondary | ICD-10-CM | POA: Diagnosis not present

## 2014-08-10 DIAGNOSIS — F1410 Cocaine abuse, uncomplicated: Secondary | ICD-10-CM | POA: Insufficient documentation

## 2014-08-10 DIAGNOSIS — Z87898 Personal history of other specified conditions: Secondary | ICD-10-CM | POA: Diagnosis not present

## 2014-08-10 DIAGNOSIS — F17210 Nicotine dependence, cigarettes, uncomplicated: Secondary | ICD-10-CM | POA: Diagnosis not present

## 2014-08-10 DIAGNOSIS — Z23 Encounter for immunization: Secondary | ICD-10-CM | POA: Insufficient documentation

## 2014-08-10 DIAGNOSIS — Z720 Tobacco use: Secondary | ICD-10-CM

## 2014-08-10 DIAGNOSIS — F17200 Nicotine dependence, unspecified, uncomplicated: Secondary | ICD-10-CM

## 2014-08-10 DIAGNOSIS — R030 Elevated blood-pressure reading, without diagnosis of hypertension: Secondary | ICD-10-CM | POA: Diagnosis not present

## 2014-08-10 DIAGNOSIS — F1911 Other psychoactive substance abuse, in remission: Secondary | ICD-10-CM

## 2014-08-10 NOTE — Progress Notes (Signed)
Patient here to establish care Patient was recently in a treatment facility for alcohol Patient states he is currently thirty five days sober

## 2014-08-10 NOTE — Patient Instructions (Signed)
Smoking Cessation Quitting smoking is important to your health and has many advantages. However, it is not always easy to quit since nicotine is a very addictive drug. Oftentimes, people try 3 times or more before being able to quit. This document explains the best ways for you to prepare to quit smoking. Quitting takes hard work and a lot of effort, but you can do it. ADVANTAGES OF QUITTING SMOKING  You will live longer, feel better, and live better.  Your body will feel the impact of quitting smoking almost immediately.  Within 20 minutes, blood pressure decreases. Your pulse returns to its normal level.  After 8 hours, carbon monoxide levels in the blood return to normal. Your oxygen level increases.  After 24 hours, the chance of having a heart attack starts to decrease. Your breath, hair, and body stop smelling like smoke.  After 48 hours, damaged nerve endings begin to recover. Your sense of taste and smell improve.  After 72 hours, the body is virtually free of nicotine. Your bronchial tubes relax and breathing becomes easier.  After 2 to 12 weeks, lungs can hold more air. Exercise becomes easier and circulation improves.  The risk of having a heart attack, stroke, cancer, or lung disease is greatly reduced.  After 1 year, the risk of coronary heart disease is cut in half.  After 5 years, the risk of stroke falls to the same as a nonsmoker.  After 10 years, the risk of lung cancer is cut in half and the risk of other cancers decreases significantly.  After 15 years, the risk of coronary heart disease drops, usually to the level of a nonsmoker.  If you are pregnant, quitting smoking will improve your chances of having a healthy baby.  The people you live with, especially any children, will be healthier.  You will have extra money to spend on things other than cigarettes. QUESTIONS TO THINK ABOUT BEFORE ATTEMPTING TO QUIT You may want to talk about your answers with your  health care provider.  Why do you want to quit?  If you tried to quit in the past, what helped and what did not?  What will be the most difficult situations for you after you quit? How will you plan to handle them?  Who can help you through the tough times? Your family? Friends? A health care provider?  What pleasures do you get from smoking? What ways can you still get pleasure if you quit? Here are some questions to ask your health care provider:  How can you help me to be successful at quitting?  What medicine do you think would be best for me and how should I take it?  What should I do if I need more help?  What is smoking withdrawal like? How can I get information on withdrawal? GET READY  Set a quit date.  Change your environment by getting rid of all cigarettes, ashtrays, matches, and lighters in your home, car, or work. Do not let people smoke in your home.  Review your past attempts to quit. Think about what worked and what did not. GET SUPPORT AND ENCOURAGEMENT You have a better chance of being successful if you have help. You can get support in many ways.  Tell your family, friends, and coworkers that you are going to quit and need their support. Ask them not to smoke around you.  Get individual, group, or telephone counseling and support. Programs are available at local hospitals and health centers. Call   your local health department for information about programs in your area.  Spiritual beliefs and practices may help some smokers quit.  Download a "quit meter" on your computer to keep track of quit statistics, such as how long you have gone without smoking, cigarettes not smoked, and money saved.  Get a self-help book about quitting smoking and staying off tobacco. LEARN NEW SKILLS AND BEHAVIORS  Distract yourself from urges to smoke. Talk to someone, go for a walk, or occupy your time with a task.  Change your normal routine. Take a different route to work.  Drink tea instead of coffee. Eat breakfast in a different place.  Reduce your stress. Take a hot bath, exercise, or read a book.  Plan something enjoyable to do every day. Reward yourself for not smoking.  Explore interactive web-based programs that specialize in helping you quit. GET MEDICINE AND USE IT CORRECTLY Medicines can help you stop smoking and decrease the urge to smoke. Combining medicine with the above behavioral methods and support can greatly increase your chances of successfully quitting smoking.  Nicotine replacement therapy helps deliver nicotine to your body without the negative effects and risks of smoking. Nicotine replacement therapy includes nicotine gum, lozenges, inhalers, nasal sprays, and skin patches. Some may be available over-the-counter and others require a prescription.  Antidepressant medicine helps people abstain from smoking, but how this works is unknown. This medicine is available by prescription.  Nicotinic receptor partial agonist medicine simulates the effect of nicotine in your brain. This medicine is available by prescription. Ask your health care provider for advice about which medicines to use and how to use them based on your health history. Your health care provider will tell you what side effects to look out for if you choose to be on a medicine or therapy. Carefully read the information on the package. Do not use any other product containing nicotine while using a nicotine replacement product.  RELAPSE OR DIFFICULT SITUATIONS Most relapses occur within the first 3 months after quitting. Do not be discouraged if you start smoking again. Remember, most people try several times before finally quitting. You may have symptoms of withdrawal because your body is used to nicotine. You may crave cigarettes, be irritable, feel very hungry, cough often, get headaches, or have difficulty concentrating. The withdrawal symptoms are only temporary. They are strongest  when you first quit, but they will go away within 10-14 days. To reduce the chances of relapse, try to:  Avoid drinking alcohol. Drinking lowers your chances of successfully quitting.  Reduce the amount of caffeine you consume. Once you quit smoking, the amount of caffeine in your body increases and can give you symptoms, such as a rapid heartbeat, sweating, and anxiety.  Avoid smokers because they can make you want to smoke.  Do not let weight gain distract you. Many smokers will gain weight when they quit, usually less than 10 pounds. Eat a healthy diet and stay active. You can always lose the weight gained after you quit.  Find ways to improve your mood other than smoking. FOR MORE INFORMATION  www.smokefree.gov  Document Released: 08/29/2001 Document Revised: 01/19/2014 Document Reviewed: 12/14/2011 ExitCare Patient Information 2015 [employer]. This information is not intended to replace advice given to you by your health care provider. Make sure you discuss any questions you have with your health care provider. DASH Eating Plan DASH stands for "Dietary Approaches to Stop Hypertension." The DASH eating plan is a healthy eating plan that has   been shown to reduce high blood pressure (hypertension). Additional health benefits may include reducing the risk of type 2 diabetes mellitus, heart disease, and stroke. The DASH eating plan may also help with weight loss. WHAT DO I NEED TO KNOW ABOUT THE DASH EATING PLAN? For the DASH eating plan, you will follow these general guidelines:  Choose foods with a percent daily value for sodium of less than 5% (as listed on the food label).  Use salt-free seasonings or herbs instead of table salt or sea salt.  Check with your health care provider or pharmacist before using salt substitutes.  Eat lower-sodium products, often labeled as "lower sodium" or "no salt added."  Eat fresh foods.  Eat more vegetables, fruits, and low-fat dairy  products.  Choose whole grains. Look for the word "whole" as the first word in the ingredient list.  Choose fish and skinless chicken or turkey more often than red meat. Limit fish, poultry, and meat to 6 oz (170 g) each day.  Limit sweets, desserts, sugars, and sugary drinks.  Choose heart-healthy fats.  Limit cheese to 1 oz (28 g) per day.  Eat more home-cooked food and less restaurant, buffet, and fast food.  Limit fried foods.  Cook foods using methods other than frying.  Limit canned vegetables. If you do use them, rinse them well to decrease the sodium.  When eating at a restaurant, ask that your food be prepared with less salt, or no salt if possible. WHAT FOODS CAN I EAT? Seek help from a dietitian for individual calorie needs. Grains Whole grain or whole wheat bread. Brown rice. Whole grain or whole wheat pasta. Quinoa, bulgur, and whole grain cereals. Low-sodium cereals. Corn or whole wheat flour tortillas. Whole grain cornbread. Whole grain crackers. Low-sodium crackers. Vegetables Fresh or frozen vegetables (raw, steamed, roasted, or grilled). Low-sodium or reduced-sodium tomato and vegetable juices. Low-sodium or reduced-sodium tomato sauce and paste. Low-sodium or reduced-sodium canned vegetables.  Fruits All fresh, canned (in natural juice), or frozen fruits. Meat and Other Protein Products Ground beef (85% or leaner), grass-fed beef, or beef trimmed of fat. Skinless chicken or turkey. Ground chicken or turkey. Pork trimmed of fat. All fish and seafood. Eggs. Dried beans, peas, or lentils. Unsalted nuts and seeds. Unsalted canned beans. Dairy Low-fat dairy products, such as skim or 1% milk, 2% or reduced-fat cheeses, low-fat ricotta or cottage cheese, or plain low-fat yogurt. Low-sodium or reduced-sodium cheeses. Fats and Oils Tub margarines without trans fats. Light or reduced-fat mayonnaise and salad dressings (reduced sodium). Avocado. Safflower, olive, or canola  oils. Natural peanut or almond butter. Other Unsalted popcorn and pretzels. The items listed above may not be a complete list of recommended foods or beverages. Contact your dietitian for more options. WHAT FOODS ARE NOT RECOMMENDED? Grains White bread. White pasta. White rice. Refined cornbread. Bagels and croissants. Crackers that contain trans fat. Vegetables Creamed or fried vegetables. Vegetables in a cheese sauce. Regular canned vegetables. Regular canned tomato sauce and paste. Regular tomato and vegetable juices. Fruits Dried fruits. Canned fruit in light or heavy syrup. Fruit juice. Meat and Other Protein Products Fatty cuts of meat. Ribs, chicken wings, bacon, sausage, bologna, salami, chitterlings, fatback, hot dogs, bratwurst, and packaged luncheon meats. Salted nuts and seeds. Canned beans with salt. Dairy Whole or 2% milk, cream, half-and-half, and cream cheese. Whole-fat or sweetened yogurt. Full-fat cheeses or blue cheese. Nondairy creamers and whipped toppings. Processed cheese, cheese spreads, or cheese curds. Condiments Onion and garlic salt,   seasoned salt, table salt, and sea salt. Canned and packaged gravies. Worcestershire sauce. Tartar sauce. Barbecue sauce. Teriyaki sauce. Soy sauce, including reduced sodium. Steak sauce. Fish sauce. Oyster sauce. Cocktail sauce. Horseradish. Ketchup and mustard. Meat flavorings and tenderizers. Bouillon cubes. Hot sauce. Tabasco sauce. Marinades. Taco seasonings. Relishes. Fats and Oils Butter, stick margarine, lard, shortening, ghee, and bacon fat. Coconut, palm kernel, or palm oils. Regular salad dressings. Other Pickles and olives. Salted popcorn and pretzels. The items listed above may not be a complete list of foods and beverages to avoid. Contact your dietitian for more information. WHERE CAN I FIND MORE INFORMATION? National Heart, Lung, and [employer] Institute: www.nhlbi.nih.gov/health/health-topics/topics/dash/ Document Released:  08/24/2011 Document Revised: 01/19/2014 Document Reviewed: 07/09/2013 ExitCare Patient Information 2015 [employer]. This information is not intended to replace advice given to you by your health care provider. Make sure you discuss any questions you have with your health care provider.  

## 2014-08-10 NOTE — Progress Notes (Signed)
Patient Demographics  [name], is a 48 y.o. male  ZOX:[id number]  WJX:[id number]  DOB - [birth year]  CC:  Chief Complaint  Patient presents with  . Establish Care       HPI: [name] is a 48 y.o. male here today to establish medical care.patient has history of hepatitis C,myositis, polysubstance abuse,as per patient he went to local detox and was in the facility for 30 days, he has been sober for the last 35 days, as per patient he was diagnosed with hep C but didn't go to treatment since he was consuming alcohol at that point, he needs to be sober for one 20 days before he can go for the treatment of hepatitis C , he also smokes cigarettes, have counseled patient to quit smoking, today's blood pressure is borderline elevated, denies any headache dizziness chest and shortness of breath. Patient does have chronic leg pain, takes ibuprofen when necessary . Patient has No headache, No chest pain, No abdominal pain - No Nausea, No new weakness tingling or numbness, No Cough - SOB.  Allergies  Allergen Reactions  . Acetaminophen Other (See Comments)    Liver disease    Past Medical History  Diagnosis Date  . Hepatitis C   . Myositis     Left deltoid biopsy at [facility] 10/11/11.  Marland Kitchen Cirrhosis   . Chronic pain syndrome   . 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 Alcohol abuse     Psychiatric admissions for alcohol and drug abuse  . History of cocaine abuse   . Depression     Multiple psychiatric admissions  . Tobacco abuse   . Tattoos   . Low TSH level January 2013    Normal free T4 of 1.06  . Back pain, chronic   . Acute renal failure     Secondary to rhabdo. Treated with dialysis short-term.   Current Outpatient Prescriptions on File Prior to Visit  Medication Sig Dispense Refill  . ibuprofen (ADVIL,MOTRIN) 200 MG tablet Take 800 mg by mouth every 6 (six) hours as needed for moderate pain.    . traMADol (ULTRAM) 50 MG tablet Take 1 tablet (50 mg total) by mouth every 6 (six) hours as  needed. 15 tablet 0   No current facility-administered medications on file prior to visit.   Family History  Problem Relation Age of Onset  . Hypertension Mother   . Cancer Father     throat cancer   . Diabetes Brother    History   Social History  . Marital Status: Married    Spouse Name: N/A    Number of Children: N/A  . Years of Education: N/A   Occupational History  . Not on file.   Social History Main Topics  . Smoking status: Current Every Day Smoker -- 1.00 packs/day for 40 years    Types: Cigarettes  . Smokeless tobacco: Current User  . Alcohol Use: No     Comment: thirty five days sober  . Drug Use: Yes    Special: Cocaine, Marijuana     Comment: last use of cocaine one month ago, marijuana 6 years ago.   Marland Kitchen Sexual Activity: Yes   Other Topics Concern  . Not on file   Social History Narrative    Review of Systems: Constitutional: Negative for fever, chills, diaphoresis, activity change, appetite change and fatigue. HENT: Negative for ear pain, nosebleeds, congestion, facial swelling, rhinorrhea, neck pain, neck stiffness and ear discharge.  Eyes: Negative for pain,  discharge, redness, itching and visual disturbance. Respiratory: Negative for cough, choking, chest tightness, shortness of breath, wheezing and stridor.  Cardiovascular: Negative for chest pain, palpitations and leg swelling. Gastrointestinal: Negative for abdominal distention. Genitourinary: Negative for dysuria, urgency, frequency, hematuria, flank pain, decreased urine volume, difficulty urinating and dyspareunia.  Musculoskeletal: Negative for back pain, joint swelling, arthralgia and gait problem. Neurological: Negative for dizziness, tremors, seizures, syncope, facial asymmetry, speech difficulty, weakness, light-headedness, numbness and headaches.  Hematological: Negative for adenopathy. Does not bruise/bleed easily. Psychiatric/Behavioral: Negative for hallucinations, behavioral problems,  confusion, dysphoric mood, decreased concentration and agitation.    Objective:   Filed Vitals:   08/10/14 1526  BP: 155/94  Pulse: 80  Temp: 98 F (36.7 C)  Resp: 16    Physical Exam: Constitutional: Patient appears well-developed and well-nourished. No distress. HENT: Normocephalic, atraumatic, External right and left ear normal. Oropharynx is clear and moist.  Eyes: Conjunctivae and EOM are normal. PERRLA, no scleral icterus. Neck: Normal ROM. Neck supple. No JVD. No tracheal deviation. No thyromegaly. CVS: RRR, S1/S2 +, no murmurs, no gallops, no carotid bruit.  Pulmonary: Effort and breath sounds normal, no stridor, rhonchi, wheezes, rales.  Abdominal: Soft. BS +, no distension, tenderness, rebound or guarding.  Musculoskeletal: Normal range of motion. No edema and no tenderness.  Neuro: Alert. Normal reflexes, muscle tone coordination. No cranial nerve deficit. Skin: Skin is warm and dry. No rash noted. Not diaphoretic. No erythema. No pallor. Psychiatric: Normal mood and affect. Behavior, judgment, thought content normal.  Lab Results  Component Value Date   WBC 8.0 06/29/2014   HGB 16.1 06/29/2014   HCT 43.9 06/29/2014   MCV 87.1 06/29/2014   PLT 218 06/29/2014   Lab Results  Component Value Date   CREATININE 0.70 06/29/2014   BUN 6 06/29/2014   NA 130* 06/29/2014   K 5.4* 06/29/2014   CL 93* 06/29/2014   CO2 24 06/29/2014    No results found for: HGBA1C Lipid Panel     Component Value Date/Time   CHOL  10/14/2009 0645    127        ATP III CLASSIFICATION:  <200     mg/dL   Desirable  [id number]  mg/dL   Borderline High  >=045>=240    mg/dL   High          TRIG 53 10/14/2009 0645   HDL 59 10/14/2009 0645   CHOLHDL 2.2 10/14/2009 0645   VLDL 11 10/14/2009 0645   LDLCALC  10/14/2009 0645    57        Total Cholesterol/HDL:CHD Risk Coronary Heart Disease Risk Table                     Men   Women  1/2 Average Risk   3.4   3.3  Average Risk       5.0    4.4  2 X Average Risk   9.6   7.1  3 X Average Risk  23.4   11.0        Use the calculated Patient Ratio above and the CHD Risk Table to determine the patient's CHD Risk.        ATP III CLASSIFICATION (LDL):  <100     mg/dL   Optimal  [id number]  mg/dL   Near or Above                    Optimal  130-159  mg/dL   Borderline  160-189  mg/dL   High  >045>190     mg/dL   Very High       Assessment and plan:   1. Elevated BP Advised patient for DASH diet. - CBC with Differential  2. Smoking Advised patient to quit smoking.  3. History of hepatitis C  - COMPLETE METABOLIC PANEL WITH GFR  4. Needs flu shot Flu shot given today  5. Need for prophylactic vaccination against Streptococcus pneumoniae (pneumococcus) Pneumovax given today.  6. Pain of lower extremity, unspecified laterality Patient takes ibuprofen when necessary.  7. History of substance abuse Patient went  through alcohol detoxs been sober for the last 35 days.     Health Maintenance  -Vaccinations:  Flu shot and pneumovax today   Return in about 3 months (around 11/10/2014).   Doris CheadleADVANI, Donni Oglesby, MD

## 2014-08-11 LAB — CBC WITH DIFFERENTIAL/PLATELET
Basophils Absolute: 0 10*3/uL (ref 0.0–0.1)
Basophils Relative: 0 % (ref 0–1)
EOS ABS: 0.4 10*3/uL (ref 0.0–0.7)
EOS PCT: 4 % (ref 0–5)
HCT: 41.3 % (ref 39.0–52.0)
Hemoglobin: 14.1 g/dL (ref 13.0–17.0)
LYMPHS PCT: 35 % (ref 12–46)
Lymphs Abs: 3.6 10*3/uL (ref 0.7–4.0)
MCH: 30.5 pg (ref 26.0–34.0)
MCHC: 34.1 g/dL (ref 30.0–36.0)
MCV: 89.4 fL (ref 78.0–100.0)
MONOS PCT: 9 % (ref 3–12)
MPV: 10.2 fL (ref 9.4–12.4)
Monocytes Absolute: 0.9 10*3/uL (ref 0.1–1.0)
NEUTROS PCT: 52 % (ref 43–77)
Neutro Abs: 5.4 10*3/uL (ref 1.7–7.7)
PLATELETS: 188 10*3/uL (ref 150–400)
RBC: 4.62 MIL/uL (ref 4.22–5.81)
RDW: 12.9 % (ref 11.5–15.5)
WBC: 10.4 10*3/uL (ref 4.0–10.5)

## 2014-08-11 LAB — COMPLETE METABOLIC PANEL WITH GFR
ALK PHOS: 46 U/L (ref 39–117)
ALT: 38 U/L (ref 0–53)
AST: 31 U/L (ref 0–37)
Albumin: 4.3 g/dL (ref 3.5–5.2)
BILIRUBIN TOTAL: 0.5 mg/dL (ref 0.2–1.2)
BUN: 12 mg/dL (ref 6–23)
CO2: 30 mEq/L (ref 19–32)
CREATININE: 0.74 mg/dL (ref 0.50–1.35)
Calcium: 9.5 mg/dL (ref 8.4–10.5)
Chloride: 100 mEq/L (ref 96–112)
GFR, Est African American: >89 mL/min
GLUCOSE: 94 mg/dL (ref 70–99)
POTASSIUM: 5.3 meq/L (ref 3.5–5.3)
Sodium: 135 mEq/L (ref 135–145)
Total Protein: 6.8 g/dL (ref 6.0–8.3)

## 2014-08-12 ENCOUNTER — Telehealth: Payer: Self-pay | Admitting: *Deleted

## 2014-08-12 NOTE — Telephone Encounter (Signed)
I spoke with pt's wife and informed her that his labs where normal.

## 2014-08-12 NOTE — Telephone Encounter (Signed)
-----   Message from Doris Cheadleeepak Advani, MD sent at 08/12/2014 11:38 AM EST ----- Call and let the Patient know that blood work is normal.

## 2014-10-07 ENCOUNTER — Emergency Department (HOSPITAL_COMMUNITY)
Admission: EM | Admit: 2014-10-07 | Discharge: 2014-10-07 | Disposition: A | Discharge status: Home / Self Care | Payer: Medicare Other | Attending: Emergency Medicine | Admitting: Emergency Medicine

## 2014-10-07 ENCOUNTER — Encounter (HOSPITAL_COMMUNITY): Payer: Self-pay | Admitting: *Deleted

## 2014-10-07 ENCOUNTER — Emergency Department (HOSPITAL_COMMUNITY)
Admission: EM | Admit: 2014-10-07 | Discharge: 2014-10-07 | Disposition: A | Discharge status: Home / Self Care | Payer: Medicare Other | Source: Home / Self Care | Attending: Emergency Medicine | Admitting: Emergency Medicine

## 2014-10-07 ENCOUNTER — Encounter (HOSPITAL_COMMUNITY): Payer: Self-pay

## 2014-10-07 DIAGNOSIS — F329 Major depressive disorder, single episode, unspecified: Secondary | ICD-10-CM | POA: Insufficient documentation

## 2014-10-07 DIAGNOSIS — Z9989 Dependence on other enabling machines and devices: Secondary | ICD-10-CM | POA: Diagnosis not present

## 2014-10-07 DIAGNOSIS — M609 Myositis, unspecified: Secondary | ICD-10-CM

## 2014-10-07 DIAGNOSIS — R279 Unspecified lack of coordination: Secondary | ICD-10-CM | POA: Diagnosis not present

## 2014-10-07 DIAGNOSIS — F17210 Nicotine dependence, cigarettes, uncomplicated: Secondary | ICD-10-CM | POA: Insufficient documentation

## 2014-10-07 DIAGNOSIS — G8929 Other chronic pain: Secondary | ICD-10-CM | POA: Diagnosis not present

## 2014-10-07 DIAGNOSIS — B1710 Acute hepatitis C without hepatic coma: Secondary | ICD-10-CM | POA: Insufficient documentation

## 2014-10-07 DIAGNOSIS — Z8739 Personal history of other diseases of the musculoskeletal system and connective tissue: Secondary | ICD-10-CM | POA: Insufficient documentation

## 2014-10-07 DIAGNOSIS — Z8619 Personal history of other infectious and parasitic diseases: Secondary | ICD-10-CM | POA: Insufficient documentation

## 2014-10-07 DIAGNOSIS — R040 Epistaxis: Secondary | ICD-10-CM

## 2014-10-07 DIAGNOSIS — Z872 Personal history of diseases of the skin and subcutaneous tissue: Secondary | ICD-10-CM | POA: Insufficient documentation

## 2014-10-07 DIAGNOSIS — Z720 Tobacco use: Secondary | ICD-10-CM | POA: Insufficient documentation

## 2014-10-07 DIAGNOSIS — R110 Nausea: Secondary | ICD-10-CM | POA: Diagnosis not present

## 2014-10-07 DIAGNOSIS — Z87448 Personal history of other diseases of urinary system: Secondary | ICD-10-CM | POA: Diagnosis not present

## 2014-10-07 LAB — COMPREHENSIVE METABOLIC PANEL
ALT: 34 U/L (ref 0–53)
AST: 41 U/L — AB (ref 0–37)
Albumin: 4.5 g/dL (ref 3.5–5.2)
Alkaline Phosphatase: 32 U/L — ABNORMAL LOW (ref 39–117)
Anion gap: 9 (ref 5–15)
BILIRUBIN TOTAL: 0.7 mg/dL (ref 0.3–1.2)
BUN: 15 mg/dL (ref 6–23)
CHLORIDE: 103 meq/L (ref 96–112)
CO2: 22 mmol/L (ref 19–32)
CREATININE: 0.82 mg/dL (ref 0.50–1.35)
Calcium: 9.1 mg/dL (ref 8.4–10.5)
Glucose, Bld: 108 mg/dL — ABNORMAL HIGH (ref 70–99)
Potassium: 4.2 mmol/L (ref 3.5–5.1)
Sodium: 134 mmol/L — ABNORMAL LOW (ref 135–145)
Total Protein: 7.6 g/dL (ref 6.0–8.3)

## 2014-10-07 LAB — CBC WITH DIFFERENTIAL/PLATELET
Basophils Absolute: 0.1 10*3/uL (ref 0.0–0.1)
Basophils Absolute: 0.1 10*3/uL (ref 0.0–0.1)
Basophils Relative: 1 % (ref 0–1)
Basophils Relative: 0 % (ref 0–1)
EOS PCT: 5 % (ref 0–5)
Eosinophils Absolute: 0.5 10*3/uL (ref 0.0–0.7)
Eosinophils Absolute: 0.3 10*3/uL (ref 0.0–0.7)
Eosinophils Relative: 3 % (ref 0–5)
HCT: 38.8 % — ABNORMAL LOW (ref 39.0–52.0)
HEMATOCRIT: 40.2 % (ref 39.0–52.0)
Hemoglobin: 14.1 g/dL (ref 13.0–17.0)
Hemoglobin: 13.5 g/dL (ref 13.0–17.0)
LYMPHS ABS: 2.2 10*3/uL (ref 0.7–4.0)
LYMPHS ABS: 2.4 10*3/uL (ref 0.7–4.0)
LYMPHS PCT: 19 % (ref 12–46)
Lymphocytes Relative: 22 % (ref 12–46)
MCH: 30.9 pg (ref 26.0–34.0)
MCH: 31 pg (ref 26.0–34.0)
MCHC: 35.1 g/dL (ref 30.0–36.0)
MCHC: 34.8 g/dL (ref 30.0–36.0)
MCV: 88 fL (ref 78.0–100.0)
MCV: 89.2 fL (ref 78.0–100.0)
MONO ABS: 0.9 10*3/uL (ref 0.1–1.0)
MONO ABS: 1.3 10*3/uL — AB (ref 0.1–1.0)
MONOS PCT: 11 % (ref 3–12)
Monocytes Relative: 9 % (ref 3–12)
NEUTROS ABS: 6.3 10*3/uL (ref 1.7–7.7)
Neutro Abs: 8.4 10*3/uL — ABNORMAL HIGH (ref 1.7–7.7)
Neutrophils Relative %: 63 % (ref 43–77)
Neutrophils Relative %: 67 % (ref 43–77)
Platelets: 269 10*3/uL (ref 150–400)
Platelets: 338 10*3/uL (ref 150–400)
RBC: 4.57 MIL/uL (ref 4.22–5.81)
RBC: 4.35 MIL/uL (ref 4.22–5.81)
RDW: 13 % (ref 11.5–15.5)
RDW: 13.2 % (ref 11.5–15.5)
WBC: 10 10*3/uL (ref 4.0–10.5)
WBC: 12.5 10*3/uL — ABNORMAL HIGH (ref 4.0–10.5)

## 2014-10-07 LAB — BASIC METABOLIC PANEL
Anion gap: 12 (ref 5–15)
BUN: 11 mg/dL (ref 6–23)
CALCIUM: 9.1 mg/dL (ref 8.4–10.5)
CHLORIDE: 95 meq/L — AB (ref 96–112)
CO2: 23 mmol/L (ref 19–32)
Creatinine, Ser: 0.78 mg/dL (ref 0.50–1.35)
Glucose, Bld: 102 mg/dL — ABNORMAL HIGH (ref 70–99)
Potassium: 3.7 mmol/L (ref 3.5–5.1)
Sodium: 130 mmol/L — ABNORMAL LOW (ref 135–145)

## 2014-10-07 LAB — RAPID URINE DRUG SCREEN, HOSP PERFORMED
Amphetamines: NOT DETECTED
BARBITURATES: NOT DETECTED
Benzodiazepines: POSITIVE — AB
COCAINE: NOT DETECTED
Opiates: NOT DETECTED
TETRAHYDROCANNABINOL: NOT DETECTED

## 2014-10-07 LAB — PROTIME-INR
INR: 1.03 (ref 0.00–1.49)
Prothrombin Time: 13.6 seconds (ref 11.6–15.2)

## 2014-10-07 LAB — CK: Total CK: 196 U/L (ref 7–232)

## 2014-10-07 MED ORDER — BACITRACIN ZINC 500 UNIT/GM EX OINT
TOPICAL_OINTMENT | CUTANEOUS | Status: AC
  Filled 2014-10-07: qty 0.9

## 2014-10-07 MED ORDER — SODIUM CHLORIDE 0.9 % IV SOLN
1000.0000 mL | Freq: Once | INTRAVENOUS | Status: AC
  Administered 2014-10-07: 1000 mL via INTRAVENOUS

## 2014-10-07 MED ORDER — FENTANYL CITRATE 0.05 MG/ML IJ SOLN
50.0000 ug | Freq: Once | INTRAMUSCULAR | Status: AC
  Administered 2014-10-07: 50 ug via INTRAVENOUS
  Filled 2014-10-07: qty 2

## 2014-10-07 MED ORDER — SODIUM CHLORIDE 0.9 % IV SOLN: INTRAVENOUS | Status: DC

## 2014-10-07 MED ORDER — OXYMETAZOLINE HCL 0.05 % NA SOLN
NASAL | Status: AC
  Administered 2014-10-07: 1 via NASAL
  Filled 2014-10-07: qty 15

## 2014-10-07 MED ORDER — LIDOCAINE VISCOUS 2 % MT SOLN
OROMUCOSAL | Status: AC
  Administered 2014-10-07: 15 mL via OROMUCOSAL
  Filled 2014-10-07: qty 15

## 2014-10-07 MED ORDER — HYDROCODONE-ACETAMINOPHEN 5-325 MG PO TABS
2.0000 | ORAL_TABLET | Freq: Once | ORAL | Status: AC
  Administered 2014-10-07: 2 via ORAL

## 2014-10-07 MED ORDER — LIDOCAINE VISCOUS 2 % MT SOLN
15.0000 mL | Freq: Once | OROMUCOSAL | Status: AC
  Administered 2014-10-07: 15 mL via OROMUCOSAL

## 2014-10-07 MED ORDER — HYDROCODONE-ACETAMINOPHEN 5-325 MG PO TABS
ORAL_TABLET | ORAL | Status: DC
Start: 2014-10-07 — End: 2014-10-07
  Filled 2014-10-07: qty 2

## 2014-10-07 MED ORDER — TRAMADOL HCL 50 MG PO TABS: 100.0000 mg | ORAL_TABLET | Freq: Four times a day (QID) | ORAL | Status: DC | PRN

## 2014-10-07 MED ORDER — ONDANSETRON HCL 4 MG/2ML IJ SOLN
4.0000 mg | Freq: Once | INTRAMUSCULAR | Status: AC
  Administered 2014-10-07: 4 mg via INTRAVENOUS
  Filled 2014-10-07: qty 2

## 2014-10-07 MED ORDER — SULFAMETHOXAZOLE-TRIMETHOPRIM 800-160 MG PO TABS: 1.0000 | ORAL_TABLET | Freq: Two times a day (BID) | ORAL | Status: DC

## 2014-10-07 MED ORDER — OXYMETAZOLINE HCL 0.05 % NA SOLN
1.0000 | Freq: Once | NASAL | Status: AC
  Administered 2014-10-07: 1 via NASAL

## 2014-10-07 MED ORDER — OXYCODONE-ACETAMINOPHEN 5-325 MG PO TABS
2.0000 | ORAL_TABLET | Freq: Once | ORAL | Status: AC
  Administered 2014-10-07: 2 via ORAL
  Filled 2014-10-07: qty 2

## 2014-10-07 MED ORDER — CEPHALEXIN 500 MG PO CAPS: 500.0000 mg | ORAL_CAPSULE | Freq: Four times a day (QID) | ORAL | Status: DC

## 2014-10-07 MED ORDER — HYDROMORPHONE HCL 1 MG/ML IJ SOLN
1.0000 mg | Freq: Once | INTRAMUSCULAR | Status: AC
  Administered 2014-10-07: 1 mg via INTRAVENOUS
  Filled 2014-10-07: qty 1

## 2014-10-07 MED ORDER — TRAMADOL HCL 50 MG PO TABS: 50.0000 mg | ORAL_TABLET | Freq: Four times a day (QID) | ORAL | Status: DC | PRN

## 2014-10-07 MED ORDER — SODIUM CHLORIDE 0.9 % IV BOLUS (SEPSIS)
250.0000 mL | Freq: Once | INTRAVENOUS | Status: AC
  Administered 2014-10-07: 250 mL via INTRAVENOUS

## 2014-10-07 NOTE — Discharge Instructions (Signed)
Nosebleed °A nosebleed can be caused by many things, including: °· Getting hit hard in the nose. °· Infections. °· Dry nose. °· Colds. °· Medicines. °Your doctor may do lab testing if you get nosebleeds a lot and the cause is not known. °HOME CARE  °· If your nose was packed with material, keep it there until your doctor takes it out. Put the pack back in your nose if the pack falls out. °· Do not blow your nose for 12 hours after the nosebleed. °· Sit up and bend forward if your nose starts bleeding again. Pinch the front half of your nose nonstop for 20 minutes. °· Put petroleum jelly inside your nose every morning if you have a dry nose. °· Use a humidifier to make the air less dry. °· Do not take aspirin. °· Try not to strain, lift, or bend at the waist for many days after the nosebleed. °GET HELP RIGHT AWAY IF:  °· Nosebleeds keep happening and are hard to stop or control. °· You have bleeding or bruises that are not normal on other parts of the body. °· You have a fever. °· The nosebleeds get worse. °· You get lightheaded, feel faint, sweaty, or throw up (vomit) blood. °MAKE SURE YOU:  °· Understand these instructions. °· Will watch your condition. °· Will get help right away if you are not doing well or get worse. °Document Released: 06/13/2008 Document Revised: 11/27/2011 Document Reviewed: 06/13/2008 °ExitCare® Patient Information ©2015 [employer]. This information is not intended to replace advice given to you by your health care provider. Make sure you discuss any questions you have with your health care provider. ° °

## 2014-10-07 NOTE — ED Notes (Signed)
Pt reports nosebleed that started approx 30 mins prior to arrival from right nare.

## 2014-10-07 NOTE — ED Notes (Signed)
Consult called for ENT via [facility]

## 2014-10-07 NOTE — ED Notes (Addendum)
Pt nose bleeding around nasal rocket. MD notified.

## 2014-10-07 NOTE — ED Notes (Signed)
Consult called to ENT via [facility]

## 2014-10-07 NOTE — ED Provider Notes (Signed)
Patient seen on arrival, mildly tachycardic, recurrent nosebleeds, bilateral nares packed, right nostril with Rhino Rocket, left nostril with foam packing, no oropharyngeal bleeding, patient ambulatory, speaking in full sentences, ear nose and throat physician, Dr. Jearld FentonByers, aware of the patient and will come to see.  ENT has seen - bleeding all but stopped wtihout other intervention, meds Rx at ENT suggestion - f/u in 5 days.  Final diagnoses:  Epistaxis     Meds given in ED:  Medications  bacitracin 500 UNIT/GM ointment (not administered)  bacitracin 500 UNIT/GM ointment (not administered)  0.9 %  sodium chloride infusion ( Intravenous Duplicate 10/07/14 1204)  sodium chloride 0.9 % bolus 250 mL (0 mLs Intravenous Stopped 10/07/14 1047)  ondansetron (ZOFRAN) injection 4 mg (4 mg Intravenous Given 10/07/14 0959)  HYDROmorphone (DILAUDID) injection 1 mg (1 mg Intravenous Given 10/07/14 0959)  oxyCODONE-acetaminophen (PERCOCET/ROXICET) 5-325 MG per tablet 2 tablet (2 tablets Oral Given 10/07/14 1206)    New Prescriptions   CEPHALEXIN (KEFLEX) 500 MG CAPSULE    Take 1 capsule (500 mg total) by mouth 4 (four) times daily.   TRAMADOL (ULTRAM) 50 MG TABLET    Take 1 tablet (50 mg total) by mouth every 6 (six) hours as needed.      Vida RollerBrian D Shoji Pertuit, MD 10/07/14 1420

## 2014-10-07 NOTE — ED Notes (Signed)
Pt [facility] at room d32 [facility]. Pt has received 250 cc bolus NS. Pain 10/10. Nasal rockets are saturated with bright red blood. Pt alert and oriented x 4. Pt states he has never been diagnosed with hypertension. Pt has hx of hepatitis and myocytis.

## 2014-10-07 NOTE — ED Provider Notes (Signed)
CSN: [id number]     Arrival date & time 10/07/14  0900 History   First MD Initiated Contact with Patient 10/07/14 0914     Chief Complaint  Patient presents with  . Epistaxis     (Consider location/radiation/quality/duration/timing/severity/associated sxs/prior Treatment) Patient is a 49 y.o. male presenting with nosebleeds. The history is provided by the patient and the EMS personnel.  Epistaxis Associated symptoms: headaches   Associated symptoms: no congestion and no fever    patient seen in the emergency department overnight for the onset of a right nosebleed that occurred around midnight. Patient had a Rhino Rocket placed in the emergency department overnight bleeding seemed to improve but then started bleeding out of the left nares. Packing was placed in that. Bleeding not completely controlled and patient was discharged home. Patient got home sneezed a Rhino Rocket came out of the right nares extensive bleeding started again EMS called again patient brought back in. Patient without a history of bleeding problems as per the patient.  Past Medical History  Diagnosis Date  . Hepatitis C   . Myositis     [facility]  Marland Kitchen. Cirrhosis   . Chronic pain syndrome   . 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 Alcohol abuse     Psychiatric admissions for alcohol and drug abuse  . History of cocaine abuse   . Depression     Multiple psychiatric admissions  . Tobacco abuse   . Tattoos   . Low TSH level January 2013    Normal free T4 of 1.06  . Back pain, chronic   . Acute renal failure     Secondary to rhabdo. Treated with dialysis short-term.   Past Surgical History  Procedure Laterality Date  . Hernia repair    . Muscle biopsy    . Appendectomy     Family History  Problem Relation Age of Onset  . Hypertension Mother   . Cancer Father     throat cancer   . Diabetes Brother    History  Substance Use Topics  . Smoking status: Current Every Day Smoker -- 1.00  packs/day for 40 years    Types: Cigarettes  . Smokeless tobacco: Current User  . Alcohol Use: No     Comment: thirty five days sober    Review of Systems  Constitutional: Negative for fever.  HENT: Positive for nosebleeds. Negative for congestion.   Eyes: Negative for visual disturbance.  Respiratory: Negative for shortness of breath.   Cardiovascular: Negative for chest pain.  Gastrointestinal: Negative for nausea, vomiting and abdominal pain.  Genitourinary: Negative for dysuria.  Musculoskeletal: Negative for neck pain.  Skin: Negative for rash.  Neurological: Positive for headaches.  Hematological: Does not bruise/bleed easily.  Psychiatric/Behavioral: Negative for confusion.      Allergies  Acetaminophen  Home Medications   Prior to Admission medications   Medication Sig Start Date End Date Taking? Authorizing Provider  ibuprofen (ADVIL,MOTRIN) 200 MG tablet Take 800 mg by mouth every 6 (six) hours as needed for moderate pain.    Historical Provider, MD  sulfamethoxazole-trimethoprim (BACTRIM DS,SEPTRA DS) 800-160 MG per tablet Take 1 tablet by mouth 2 (two) times daily. 10/07/14   Ward GivensIva L Knapp, MD  traMADol (ULTRAM) 50 MG tablet Take 2 tablets (100 mg total) by mouth every 6 (six) hours as needed. 10/07/14   Ward GivensIva L Knapp, MD   BP 143/108 mmHg  Pulse 140  Resp 16  SpO2 100% Physical Exam  Constitutional:  He is oriented to person, place, and time. He appears well-developed and well-nourished. He appears distressed.  HENT:  Head: Normocephalic and atraumatic.  Extensive bleeding out of the right nares. Packing in the left nares but also bleeding from that area. Bleeding going down the back of the throat. Site of bleeding in the right nares not able to be visualized.  Eyes: Conjunctivae and EOM are normal. Pupils are equal, round, and reactive to light.  Neck: Normal range of motion. Neck supple.  Cardiovascular: Regular rhythm and normal heart sounds.   Tachycardic.   Pulmonary/Chest: Effort normal and breath sounds normal. No respiratory distress.  Abdominal: Soft. Bowel sounds are normal. There is no tenderness.  Musculoskeletal: Normal range of motion.  Neurological: He is alert and oriented to person, place, and time. No cranial nerve deficit. He exhibits normal muscle tone. Coordination normal.  Skin: Skin is warm. No rash noted.  Nursing note and vitals reviewed.   ED Course  EPISTAXIS MANAGEMENT Date/Time: 10/07/2014 9:57 AM Performed by: Vanetta Mulders Authorized by: Vanetta Mulders Consent: The procedure was performed in an emergent situation. Verbal consent obtained. Written consent not obtained. Risks and benefits: risks, benefits and alternatives were discussed Consent given by: patient Patient understanding: patient states understanding of the procedure being performed Patient identity confirmed: verbally with patient Time out: Immediately prior to procedure a "time out" was called to verify the correct patient, procedure, equipment, support staff and site/side marked as required. Patient sedated: no Treatment site: right anterior Repair method: nasal balloon Post-procedure assessment: bleeding decreased Treatment complexity: complex Recurrence: recurrence of recent bleed   (including critical care time) Labs Review Labs Reviewed  COMPREHENSIVE METABOLIC PANEL  CBC WITH DIFFERENTIAL  PROTIME-INR   Results for orders placed or performed during the hospital encounter of 10/07/14  CK  Result Value Ref Range   Total CK 196 7 - 232 U/L  CBC with Differential  Result Value Ref Range   WBC 10.0 4.0 - 10.5 K/uL   RBC 4.57 4.22 - 5.81 MIL/uL   Hemoglobin 14.1 13.0 - 17.0 g/dL   HCT 16.1 09.6 - 04.5 %   MCV 88.0 78.0 - 100.0 fL   MCH 30.9 26.0 - 34.0 pg   MCHC 35.1 30.0 - 36.0 g/dL   RDW 40.9 81.1 - 91.4 %   Platelets 269 150 - 400 K/uL   Neutrophils Relative % 63 43 - 77 %   Neutro Abs 6.3 1.7 - 7.7 K/uL   Lymphocytes  Relative 22 12 - 46 %   Lymphs Abs 2.2 0.7 - 4.0 K/uL   Monocytes Relative 9 3 - 12 %   Monocytes Absolute 0.9 0.1 - 1.0 K/uL   Eosinophils Relative 5 0 - 5 %   Eosinophils Absolute 0.5 0.0 - 0.7 K/uL   Basophils Relative 1 0 - 1 %   Basophils Absolute 0.1 0.0 - 0.1 K/uL  Basic metabolic panel  Result Value Ref Range   Sodium 130 (L) 135 - 145 mmol/L   Potassium 3.7 3.5 - 5.1 mmol/L   Chloride 95 (L) 96 - 112 mEq/L   CO2 23 19 - 32 mmol/L   Glucose, Bld 102 (H) 70 - 99 mg/dL   BUN 11 6 - 23 mg/dL   Creatinine, Ser 7.82 0.50 - 1.35 mg/dL   Calcium 9.1 8.4 - 95.6 mg/dL   GFR calc non Af Amer >90 >90 mL/min   GFR calc Af Amer >90 >90 mL/min   Anion gap 12 5 -  15  Drug screen panel, emergency  Result Value Ref Range   Opiates NONE DETECTED NONE DETECTED   Cocaine NONE DETECTED NONE DETECTED   Benzodiazepines POSITIVE (A) NONE DETECTED   Amphetamines NONE DETECTED NONE DETECTED   Tetrahydrocannabinol NONE DETECTED NONE DETECTED   Barbiturates NONE DETECTED NONE DETECTED     Imaging Review No results found.   EKG Interpretation None      MDM   Final diagnoses:  Epistaxis    Patient with recurrent 6 significant nosebleed. Patient seen overnight in the emergency department had a Rhino Rocket placed in the right nares. Had persistent bleeding out of the left nares and packing was placed in that. Patient did get home this morning around 6 in the morning patient's knees Rhino Rocket came out extensive bleeding started again.  Upon arrival here [facility] was replaced in the right nares and balloon inflated with some improvement and slowing of the bleeding but it did not stop. Discussed with Dr. Jearld Fenton your nose and throat he is aware the patient. Patient will go to [facility] discussed with Dr. Eber Hong there. Patient will go by EMS. Patient somewhat tachycardic here but not hypotensive.  Patient mentating fine. Patient with a significant past medical  history of hepatitis C known to have cirrhosis. Lab work was done during the night CBC and basic metabolic panel this morning lab work had been extended to look at liver function test another repeat CBC and an INR.   As stated bleeding is improved but not completely controlled. Patient stable at this point in time.    Vanetta Mulders, MD 10/07/14 [phone]

## 2014-10-07 NOTE — Discharge Instructions (Signed)
Unfortunately, you will need to leave the packs in your nose or the bleeding will restart. Call Dr Avel Sensoreoh's office today to get an appointment to have him evaluate your nose bleed. He is an ENT specialist and usually comes to [place] on Thursdays. Return to the ED if you have a lot of bleeding down the back of your throat or if you have bleeding coming around the packs in your nose. Take the tramadol for pain and take the antibiotic to prevent getting a sinus infection from the packs.  Nosebleed A nosebleed can be caused by many things, including:  Getting hit hard in the nose.  Infections.  Dry nose.  Colds.  Medicines. Your doctor may do lab testing if you get nosebleeds a lot and the cause is not known. HOME CARE   If your nose was packed with material, keep it there until your doctor takes it out. Put the pack back in your nose if the pack falls out.  Do not blow your nose for 12 hours after the nosebleed.  Sit up and bend forward if your nose starts bleeding again. Pinch the front half of your nose nonstop for 20 minutes.  Put petroleum jelly inside your nose every morning if you have a dry nose.  Use a humidifier to make the air less dry.  Do not take aspirin.  Try not to strain, lift, or bend at the waist for many days after the nosebleed. GET HELP RIGHT AWAY IF:   Nosebleeds keep happening and are hard to stop or control.  You have bleeding or bruises that are not normal on other parts of the body.  You have a fever.  The nosebleeds get worse.  You get lightheaded, feel faint, sweaty, or throw up (vomit) blood. MAKE SURE YOU:   Understand these instructions.  Will watch your condition.  Will get help right away if you are not doing well or get worse. Document Released: 06/13/2008 Document Revised: 11/27/2011 Document Reviewed: 06/13/2008 [facility] Patient Information 2015 [place] [place]. This information is not intended to replace advice given to you by your  health care provider. Make sure you discuss any questions you have with your health care provider.

## 2014-10-07 NOTE — ED Notes (Signed)
Nosebleed x 20 mins pta

## 2014-10-07 NOTE — ED Notes (Signed)
Left here at 0530 for same. Nasal pacing applied, Pt states it began at 0500. Continuously dripping blood at this time.

## 2014-10-07 NOTE — Consult Note (Signed)
Reason for Consult:epistaxis Referring Physician: er  [name] is an 50 y.o. male.  HPI: hx of epistaxis since last night. He had pack placed and came out. He startted bleeding again and another pack into the right side. He has all right sided bleeding except when right pack placed. He had pack placed into the left side based on that. He has no current bleeding. No hx of epistaxis. No trauma.  Past Medical History  Diagnosis Date  . Hepatitis C   . Myositis     Left deltoid biopsy at [facility] 10/11/11.  Marland Kitchen Cirrhosis   . Chronic pain syndrome   . 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 Alcohol abuse     Psychiatric admissions for alcohol and drug abuse  . History of cocaine abuse   . Depression     Multiple psychiatric admissions  . Tobacco abuse   . Tattoos   . Low TSH level January 2013    Normal free T4 of 1.06  . Back pain, chronic   . Acute renal failure     Secondary to rhabdo. Treated with dialysis short-term.    Past Surgical History  Procedure Laterality Date  . Hernia repair    . Muscle biopsy    . Appendectomy      Family History  Problem Relation Age of Onset  . Hypertension Mother   . Cancer Father     throat cancer   . Diabetes Brother     Social History:  reports that he has been smoking Cigarettes.  He has a 40 pack-year smoking history. He uses smokeless tobacco. He reports that he uses illicit drugs (Cocaine and Marijuana). He reports that he does not drink alcohol.  Allergies:  Allergies  Allergen Reactions  . Acetaminophen Other (See Comments)    Liver disease     Medications: I have reviewed the patient's current medications.  Results for orders placed or performed during the hospital encounter of 10/07/14 (from the past 48 hour(s))  Comprehensive metabolic panel     Status: Abnormal   Collection Time: 10/07/14  9:50 AM  Result Value Ref Range   Sodium 134 (L) 135 - 145 mmol/L    Comment: Please note change in reference range.   Potassium 4.2 3.5 - 5.1  mmol/L    Comment: Please note change in reference range.   Chloride 103 96 - 112 mEq/L    Comment: DELTA CHECK NOTED   CO2 22 19 - 32 mmol/L   Glucose, Bld 108 (H) 70 - 99 mg/dL   BUN 15 6 - 23 mg/dL   Creatinine, Ser 0.82 0.50 - 1.35 mg/dL   Calcium 9.1 8.4 - 10.5 mg/dL   Total Protein 7.6 6.0 - 8.3 g/dL   Albumin 4.5 3.5 - 5.2 g/dL   AST 41 (H) 0 - 37 U/L   ALT 34 0 - 53 U/L   Alkaline Phosphatase 32 (L) 39 - 117 U/L   Total Bilirubin 0.7 0.3 - 1.2 mg/dL   GFR calc non Af Amer >90 >90 mL/min   GFR calc Af Amer >90 >90 mL/min    Comment: (NOTE) The eGFR has been calculated using the CKD EPI equation. This calculation has not been validated in all clinical situations. eGFR's persistently <90 mL/min signify possible Chronic Kidney Disease.    Anion gap 9 5 - 15  CBC with Differential     Status: Abnormal   Collection Time: 10/07/14  9:50 AM  Result Value Ref Range  WBC 12.5 (H) 4.0 - 10.5 K/uL   RBC 4.35 4.22 - 5.81 MIL/uL   Hemoglobin 13.5 13.0 - 17.0 g/dL   HCT 38.8 (L) 39.0 - 52.0 %   MCV 89.2 78.0 - 100.0 fL   MCH 31.0 26.0 - 34.0 pg   MCHC 34.8 30.0 - 36.0 g/dL   RDW 13.2 11.5 - 15.5 %   Platelets 338 150 - 400 K/uL   Neutrophils Relative % 67 43 - 77 %   Neutro Abs 8.4 (H) 1.7 - 7.7 K/uL   Lymphocytes Relative 19 12 - 46 %   Lymphs Abs 2.4 0.7 - 4.0 K/uL   Monocytes Relative 11 3 - 12 %   Monocytes Absolute 1.3 (H) 0.1 - 1.0 K/uL   Eosinophils Relative 3 0 - 5 %   Eosinophils Absolute 0.3 0.0 - 0.7 K/uL   Basophils Relative 0 0 - 1 %   Basophils Absolute 0.1 0.0 - 0.1 K/uL  Protime-INR     Status: None   Collection Time: 10/07/14  9:50 AM  Result Value Ref Range   Prothrombin Time 13.6 11.6 - 15.2 seconds   INR 1.03 0.00 - 1.49    No results found.  Review of Systems  Constitutional: Negative.   HENT: Negative.   Eyes: Negative.   Skin: Negative.    Blood pressure 136/98, pulse 114, temperature 98.6 F (37 C), temperature source Oral, resp. rate  29, SpO2 99 %. Physical Exam  Constitutional: He appears well-developed and well-nourished.  HENT:  Packing in both sides of the nose. Left side removed as it was barely in. No bleeding from nose or nasopharynx. No facial swelling.   Eyes: Conjunctivae are normal. Pupils are equal, round, and reactive to light.  Neck: Normal range of motion. Neck supple.    Assessment/Plan: Epistaxis- he is currently packed and not bleeding. He should leave packing in the right side for 5 days. F/u in office in 5 days sooner if further bleeding. Left pack removed which was not in properly and it sounds like he had bleeding around the opposite side when the right pack placed. He should be on staph covering antibiotic.    Melissa Montane 10/07/2014, 11:25 AM

## 2014-10-07 NOTE — ED Provider Notes (Signed)
CSN: [id number]     Arrival date & time 10/07/14  0021 History   First MD Initiated Contact with Patient 10/07/14 0030     This chart was scribed for Ward GivensIva L Saina Waage, MD by Arlan OrganAshley Leger, ED Scribe. This patient was seen in room APA19/APA19 and the patient's care was started 12:54 AM.   Chief Complaint  Patient presents with  . Epistaxis   The history is provided by the patient. No language interpreter was used.    HPI Comments: [name] is a 49 y.o. male with a PMHx of hepatitis C, cirrhosis, myositis, and acute renal failure who presents to the Emergency Department complaining of new onset epistaxis from R nare x 20 minutes prior to arrival while watching television. No history of previous nosebleeds. He denies any recent colds. [name] has electric heating in his home. He denies any recent cocaine use. Pt admits to drinking one 12 ounce beer prior to nosebleed this evening. [name] is not currently taking an prescribed medications. Pt with known allergy to Acetaminophen.  He is not currently followed by a PCP.  Past Medical History  Diagnosis Date  . Hepatitis C   . Myositis     [facility]11.  Marland Kitchen. Cirrhosis   . Chronic pain syndrome   . 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 Alcohol abuse     Psychiatric admissions for alcohol and drug abuse  . History of cocaine abuse   . Depression     Multiple psychiatric admissions  . Tobacco abuse   . Tattoos   . Low TSH level January 2013    Normal free T4 of 1.06  . Back pain, chronic   . Acute renal failure     Secondary to rhabdo. Treated with dialysis short-term.   Past Surgical History  Procedure Laterality Date  . Hernia repair    . Muscle biopsy    . Appendectomy     Family History  Problem Relation Age of Onset  . Hypertension Mother   . Cancer Father     throat cancer   . Diabetes Brother    History  Substance Use Topics  . Smoking status: Current Every Day Smoker -- 1.00 packs/day for 40 years    Types:  Cigarettes  . Smokeless tobacco: Current User  . Alcohol Use: No     Comment: thirty five days sober   patient states he does not snort cocaine, he has not used it recently but states if he does he smokes it. Patient states he drinks 1 beer a day Patient lives at home with his spouse Patient smokes a half pack per day  Review of Systems  Constitutional: Negative for fever and chills.  HENT: Positive for nosebleeds.   Gastrointestinal: Positive for nausea.  All other systems reviewed and are negative.     Allergies  Acetaminophen  Home Medications   Prior to Admission medications   Medication Sig Start Date End Date Taking? Authorizing Provider  ibuprofen (ADVIL,MOTRIN) 200 MG tablet Take 800 mg by mouth every 6 (six) hours as needed for moderate pain.    Historical Provider, MD  traMADol (ULTRAM) 50 MG tablet Take 1 tablet (50 mg total) by mouth every 6 (six) hours as needed. 07/24/14   Gerhard Munchobert Lockwood, MD   Triage Vitals: BP 150/109 mmHg  Pulse 128  Resp 24  Ht 5\' 9"  (1.753 m)  Wt 136 lb (61.689 kg)  BMI 20.07 kg/m2  SpO2 98%  Vital signs normal except hypertension   Physical Exam  Constitutional: He is oriented to person, place, and time. He appears well-developed and well-nourished.  Non-toxic appearance. He does not appear ill. No distress.  HENT:  Head: Normocephalic and atraumatic.  Right Ear: External ear normal.  Left Ear: External ear normal.  Nose: Nose normal. No mucosal edema or rhinorrhea.  Mouth/Throat: Oropharynx is clear and moist and mucous membranes are normal. No dental abscesses or uvula swelling.  Eyes: Conjunctivae and EOM are normal. Pupils are equal, round, and reactive to light.  Neck: Normal range of motion and full passive range of motion without pain. Neck supple.  Cardiovascular: Normal rate, regular rhythm and normal heart sounds.  Exam reveals no gallop and no friction rub.   No murmur heard. Pulmonary/Chest: Effort normal and breath  sounds normal. No respiratory distress. He has no wheezes. He has no rhonchi. He has no rales. He exhibits no tenderness and no crepitus.  Abdominal: Soft. Normal appearance and bowel sounds are normal. He exhibits no distension. There is no tenderness. There is no rebound and no guarding.  Musculoskeletal: Normal range of motion. He exhibits no edema or tenderness.  Moves all extremities well.   Neurological: He is alert and oriented to person, place, and time. He has normal strength. No cranial nerve deficit.  Skin: Skin is warm, dry and intact. No rash noted. No erythema. No pallor.  Psychiatric: He has a normal mood and affect. His speech is normal and behavior is normal. His mood appears not anxious.  Nursing note and vitals reviewed.   ED Course  Procedures (including critical care time)  Medications  0.9 %  sodium chloride infusion (not administered)  0.9 %  sodium chloride infusion (0 mLs Intravenous Stopped 10/07/14 0340)  fentaNYL (SUBLIMAZE) injection 50 mcg (50 mcg Intravenous Given 10/07/14 0140)  lidocaine (XYLOCAINE) 2 % viscous mouth solution 15 mL (15 mLs Mouth/Throat Given 10/07/14 0140)  oxymetazoline (AFRIN) 0.05 % nasal spray 1 spray (0 sprays Each Nare Duplicate 10/07/14 0153)  HYDROcodone-acetaminophen (NORCO/VICODIN) 5-325 MG per tablet 2 tablet (2 tablets Oral Given 10/07/14 0257)     DIAGNOSTIC STUDIES: Oxygen Saturation is 98% on RA, Normal by my interpretation.    COORDINATION OF CARE:  Patient was having brisk bleeding despite pressure being held by nursing staff and myself. Therefore nasal packing was needed.  12:58 AM- Rapid rhino placed in R nostril. He then starting having bleeding out of L nostril, applied merocel sponge in L nostril. Discussed treatment plan with pt at bedside and pt agreed to plan.    IV was inserted and patient was given IV fluids. He was observed until almost 5 AM. The bleeding appears to be well-controlled with the packs in place.    Labs Review Results for orders placed or performed during the hospital encounter of 10/07/14  CK  Result Value Ref Range   Total CK 196 7 - 232 U/L  CBC with Differential  Result Value Ref Range   WBC 10.0 4.0 - 10.5 K/uL   RBC 4.57 4.22 - 5.81 MIL/uL   Hemoglobin 14.1 13.0 - 17.0 g/dL   HCT 45.4 09.8 - 11.9 %   MCV 88.0 78.0 - 100.0 fL   MCH 30.9 26.0 - 34.0 pg   MCHC 35.1 30.0 - 36.0 g/dL   RDW 14.7 82.9 - 56.2 %   Platelets 269 150 - 400 K/uL   Neutrophils Relative % 63 43 - 77 %   Neutro Abs  6.3 1.7 - 7.7 K/uL   Lymphocytes Relative 22 12 - 46 %   Lymphs Abs 2.2 0.7 - 4.0 K/uL   Monocytes Relative 9 3 - 12 %   Monocytes Absolute 0.9 0.1 - 1.0 K/uL   Eosinophils Relative 5 0 - 5 %   Eosinophils Absolute 0.5 0.0 - 0.7 K/uL   Basophils Relative 1 0 - 1 %   Basophils Absolute 0.1 0.0 - 0.1 K/uL  Basic metabolic panel  Result Value Ref Range   Sodium 130 (L) 135 - 145 mmol/L   Potassium 3.7 3.5 - 5.1 mmol/L   Chloride 95 (L) 96 - 112 mEq/L   CO2 23 19 - 32 mmol/L   Glucose, Bld 102 (H) 70 - 99 mg/dL   BUN 11 6 - 23 mg/dL   Creatinine, Ser 1.61 0.50 - 1.35 mg/dL   Calcium 9.1 8.4 - 09.6 mg/dL   GFR calc non Af Amer >90 >90 mL/min   GFR calc Af Amer >90 >90 mL/min   Anion gap 12 5 - 15  Drug screen panel, emergency  Result Value Ref Range   Opiates NONE DETECTED NONE DETECTED   Cocaine NONE DETECTED NONE DETECTED   Benzodiazepines POSITIVE (A) NONE DETECTED   Amphetamines NONE DETECTED NONE DETECTED   Tetrahydrocannabinol NONE DETECTED NONE DETECTED   Barbiturates NONE DETECTED NONE DETECTED   Laboratory interpretation all normal except hyponatremia     Imaging Review No results found.   EKG Interpretation None      MDM   Final diagnoses:  Epistaxis    New Prescriptions   SULFAMETHOXAZOLE-TRIMETHOPRIM (BACTRIM DS,SEPTRA DS) 800-160 MG PER TABLET    Take 1 tablet by mouth 2 (two) times daily.   TRAMADOL (ULTRAM) 50 MG TABLET    Take 2 tablets (100 mg  total) by mouth every 6 (six) hours as needed.    Plan discharge  I personally performed the services described in this documentation, which was scribed in my presence. The recorded information has been reviewed and considered.  Devoria Albe, MD, FACEP     Ward Givens, MD 10/07/14 [phone]

## 2014-10-07 NOTE — ED Notes (Signed)
Dr. Jearld FentonByers at bedside. Pt is going to be sent home according to MD. Pt given instructions to leave rocket in nostril for 5 days, will be removed on Monday by MD at office.

## 2014-10-13 DIAGNOSIS — Z789 Other specified health status: Secondary | ICD-10-CM | POA: Diagnosis not present

## 2014-10-13 DIAGNOSIS — Z720 Tobacco use: Secondary | ICD-10-CM | POA: Diagnosis not present

## 2014-10-13 DIAGNOSIS — R040 Epistaxis: Secondary | ICD-10-CM | POA: Diagnosis not present

## 2014-10-13 DIAGNOSIS — Z8619 Personal history of other infectious and parasitic diseases: Secondary | ICD-10-CM | POA: Diagnosis not present

## 2014-12-29 ENCOUNTER — Encounter (HOSPITAL_COMMUNITY): Payer: Self-pay

## 2014-12-29 ENCOUNTER — Inpatient Hospital Stay (HOSPITAL_COMMUNITY)
Admission: EM | Admit: 2014-12-29 | Discharge: 2014-12-30 | DRG: 558 | Disposition: A | Discharge status: Home / Self Care | Payer: Medicare Other | Attending: Internal Medicine | Admitting: Internal Medicine

## 2014-12-29 DIAGNOSIS — M6282 Rhabdomyolysis: Secondary | ICD-10-CM | POA: Diagnosis not present

## 2014-12-29 DIAGNOSIS — F329 Major depressive disorder, single episode, unspecified: Secondary | ICD-10-CM | POA: Diagnosis present

## 2014-12-29 DIAGNOSIS — F1910 Other psychoactive substance abuse, uncomplicated: Secondary | ICD-10-CM | POA: Diagnosis present

## 2014-12-29 DIAGNOSIS — B1920 Unspecified viral hepatitis C without hepatic coma: Secondary | ICD-10-CM | POA: Diagnosis present

## 2014-12-29 DIAGNOSIS — Z720 Tobacco use: Secondary | ICD-10-CM | POA: Diagnosis present

## 2014-12-29 DIAGNOSIS — Z79899 Other long term (current) drug therapy: Secondary | ICD-10-CM

## 2014-12-29 DIAGNOSIS — K7460 Unspecified cirrhosis of liver: Secondary | ICD-10-CM | POA: Diagnosis present

## 2014-12-29 DIAGNOSIS — F17210 Nicotine dependence, cigarettes, uncomplicated: Secondary | ICD-10-CM | POA: Diagnosis present

## 2014-12-29 DIAGNOSIS — F1410 Cocaine abuse, uncomplicated: Secondary | ICD-10-CM

## 2014-12-29 DIAGNOSIS — G894 Chronic pain syndrome: Secondary | ICD-10-CM | POA: Diagnosis present

## 2014-12-29 DIAGNOSIS — F1010 Alcohol abuse, uncomplicated: Secondary | ICD-10-CM | POA: Diagnosis present

## 2014-12-29 DIAGNOSIS — M791 Myalgia: Secondary | ICD-10-CM | POA: Diagnosis not present

## 2014-12-29 DIAGNOSIS — IMO0001 Reserved for inherently not codable concepts without codable children: Secondary | ICD-10-CM | POA: Diagnosis present

## 2014-12-29 HISTORY — DX: Rhabdomyolysis: M62.82

## 2014-12-29 HISTORY — DX: Other chronic pain: G89.29

## 2014-12-29 HISTORY — DX: Pain in leg, unspecified: M79.606

## 2014-12-29 LAB — CBC WITH DIFFERENTIAL/PLATELET
BASOS PCT: 1 % (ref 0–1)
Basophils Absolute: 0 10*3/uL (ref 0.0–0.1)
EOS ABS: 0.3 10*3/uL (ref 0.0–0.7)
Eosinophils Relative: 5 % (ref 0–5)
HCT: 42.4 % (ref 39.0–52.0)
HEMOGLOBIN: 15 g/dL (ref 13.0–17.0)
Lymphocytes Relative: 45 % (ref 12–46)
Lymphs Abs: 3.1 10*3/uL (ref 0.7–4.0)
MCH: 30.8 pg (ref 26.0–34.0)
MCHC: 35.4 g/dL (ref 30.0–36.0)
MCV: 87.1 fL (ref 78.0–100.0)
MONOS PCT: 10 % (ref 3–12)
Monocytes Absolute: 0.7 10*3/uL (ref 0.1–1.0)
NEUTROS PCT: 39 % — AB (ref 43–77)
Neutro Abs: 2.6 10*3/uL (ref 1.7–7.7)
PLATELETS: 195 10*3/uL (ref 150–400)
RBC: 4.87 MIL/uL (ref 4.22–5.81)
RDW: 13 % (ref 11.5–15.5)
WBC: 6.7 10*3/uL (ref 4.0–10.5)

## 2014-12-29 LAB — COMPREHENSIVE METABOLIC PANEL
ALBUMIN: 4.1 g/dL (ref 3.5–5.2)
ALT: 44 U/L (ref 0–53)
AST: 76 U/L — ABNORMAL HIGH (ref 0–37)
Alkaline Phosphatase: 32 U/L — ABNORMAL LOW (ref 39–117)
Anion gap: 11 (ref 5–15)
BILIRUBIN TOTAL: 0.4 mg/dL (ref 0.3–1.2)
BUN: 7 mg/dL (ref 6–23)
CHLORIDE: 100 mmol/L (ref 96–112)
CO2: 23 mmol/L (ref 19–32)
CREATININE: 0.86 mg/dL (ref 0.50–1.35)
Calcium: 9.5 mg/dL (ref 8.4–10.5)
GFR calc Af Amer: >90 mL/min (ref >90)
GFR calc non Af Amer: >90 mL/min (ref >90)
Glucose, Bld: 95 mg/dL (ref 70–99)
Potassium: 4.2 mmol/L (ref 3.5–5.1)
Sodium: 134 mmol/L — ABNORMAL LOW (ref 135–145)
Total Protein: 7 g/dL (ref 6.0–8.3)

## 2014-12-29 LAB — RAPID URINE DRUG SCREEN, HOSP PERFORMED
AMPHETAMINES: NOT DETECTED
Barbiturates: NOT DETECTED
Benzodiazepines: POSITIVE — AB
COCAINE: POSITIVE — AB
Opiates: NOT DETECTED
Tetrahydrocannabinol: NOT DETECTED

## 2014-12-29 LAB — ETHANOL: ALCOHOL ETHYL (B): 36 mg/dL — AB (ref 0–9)

## 2014-12-29 LAB — CK: Total CK: 683 U/L — ABNORMAL HIGH (ref 7–232)

## 2014-12-29 MED ORDER — SODIUM CHLORIDE 0.9 % IV SOLN: INTRAVENOUS | Status: DC

## 2014-12-29 MED ORDER — LORAZEPAM 1 MG PO TABS: 0.0000 mg | ORAL_TABLET | Freq: Two times a day (BID) | ORAL | Status: DC

## 2014-12-29 MED ORDER — VITAMIN B-1 100 MG PO TABS
100.0000 mg | ORAL_TABLET | Freq: Every day | ORAL | Status: DC
  Administered 2014-12-30: 100 mg via ORAL
  Filled 2014-12-29: qty 1

## 2014-12-29 MED ORDER — SODIUM CHLORIDE 0.9 % IV BOLUS (SEPSIS)
1000.0000 mL | Freq: Once | INTRAVENOUS | Status: AC
  Administered 2014-12-29: 1000 mL via INTRAVENOUS

## 2014-12-29 MED ORDER — LORAZEPAM 1 MG PO TABS
0.0000 mg | ORAL_TABLET | Freq: Four times a day (QID) | ORAL | Status: DC
  Administered 2014-12-29 – 2014-12-30 (×3): 1 mg via ORAL
  Filled 2014-12-29 (×3): qty 1

## 2014-12-29 MED ORDER — THIAMINE HCL 100 MG/ML IJ SOLN: 100.0000 mg | Freq: Every day | INTRAMUSCULAR | Status: DC

## 2014-12-29 NOTE — ED Notes (Signed)
CIWA Alcohol Scale score 7, pt will receive 1mg  of Ativan.

## 2014-12-29 NOTE — ED Notes (Signed)
Patient states that he stays in pain and that he has tried to get a doctor for his condition and that no one will accept him because he has medicaid. States that he has been drinking to relieve the pain.

## 2014-12-29 NOTE — ED Provider Notes (Signed)
CSN: [id number]     Arrival date & time 12/29/14  2021 History   First MD Initiated Contact with Patient 12/29/14 2109     Chief Complaint  Patient presents with  . Alcohol Problem      HPI Pt was seen at 2110.  Per pt, c/o gradual onset and persistence of constant alcohol abuse for the past several years. Pt states he "drinks to get rid of the pain from my muscles." Pt states he has chronic diffuse muscles pain for the past several years. Pt has not f/u with a specialist for same. Pt states he has had several hospital admissions for his muscles pain, dx rhabdomyolysis, and "even needed dialysis." Pt states he came to the ED today "because I want to stop drinking." Denies any other complaints. Denies any change in his usual chronic muscles pain. Denies abd pain, no N/V/D, no CP/SOB, no fevers, no rash, no focal motor weakness, no tingling/numbness in extremities, no injury. Denies SI, no HI, no hallucinations.     Past Medical History  Diagnosis Date  . Hepatitis C   . Myositis     [facility].  Marland Kitchen. Cirrhosis   . Chronic pain syndrome   . 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 Alcohol abuse     Psychiatric admissions for alcohol and drug abuse  . History of cocaine abuse   . Depression     Multiple psychiatric admissions  . Tobacco abuse   . Tattoos   . Low TSH level January 2013    Normal free T4 of 1.06  . Back pain, chronic   . Acute renal failure     Secondary to rhabdo. Treated with dialysis short-term.  . Chronic leg pain   . Rhabdomyolysis    Past Surgical History  Procedure Laterality Date  . Hernia repair    . Muscle biopsy    . Appendectomy     Family History  Problem Relation Age of Onset  . Hypertension Mother   . Cancer Father     throat cancer   . Diabetes Brother    History  Substance Use Topics  . Smoking status: Current Every Day Smoker -- 1.00 packs/day for 40 years    Types: Cigarettes  . Smokeless tobacco: Current User  . Alcohol  Use: No     Comment: thirty five days sober    Review of Systems ROS: Statement: All systems negative except as marked or noted in the HPI; Constitutional: Negative for fever and chills. ; ; Eyes: Negative for eye pain, redness and discharge. ; ; ENMT: Negative for ear pain, hoarseness, nasal congestion, sinus pressure and sore throat. ; ; Cardiovascular: Negative for chest pain, palpitations, diaphoresis, dyspnea and peripheral edema. ; ; Respiratory: Negative for cough, wheezing and stridor. ; ; Gastrointestinal: Negative for nausea, vomiting, diarrhea, abdominal pain, blood in stool, hematemesis, jaundice and rectal bleeding. . ; ; Genitourinary: Negative for dysuria, flank pain and hematuria. ; ; Musculoskeletal: +diffuse chronic "muscle pain." Negative for back pain and neck pain. Negative for swelling and trauma.; ; Skin: Negative for pruritus, rash, abrasions, blisters, bruising and skin lesion.; ; Neuro: Negative for headache, lightheadedness and neck stiffness. Negative for weakness, altered level of consciousness , altered mental status, extremity weakness, paresthesias, involuntary movement, seizure and syncope.; Psych:  No SI, no SA, no HI, no hallucinations.      Allergies  Acetaminophen  Home Medications   Prior to Admission medications   Medication Sig Start  Date End Date Taking? Authorizing Provider  acetaminophen (TYLENOL) 500 MG tablet Take 500 mg by mouth daily as needed for mild pain, moderate pain or headache.    Yes Historical Provider, MD  ibuprofen (ADVIL,MOTRIN) 200 MG tablet Take 800 mg by mouth daily as needed for headache or moderate pain.    Yes Historical Provider, MD  ranitidine (ZANTAC) 150 MG tablet Take 150 mg by mouth daily as needed.   Yes Historical Provider, MD  cephALEXin (KEFLEX) 500 MG capsule Take 1 capsule (500 mg total) by mouth 4 (four) times daily. Patient not taking: Reported on 12/29/2014 10/07/14   Eber Hong, MD  sulfamethoxazole-trimethoprim  (BACTRIM DS,SEPTRA DS) 800-160 MG per tablet Take 1 tablet by mouth 2 (two) times daily. Patient not taking: Reported on 12/29/2014 10/07/14   Devoria Albe, MD  traMADol (ULTRAM) 50 MG tablet Take 1 tablet (50 mg total) by mouth every 6 (six) hours as needed. Patient not taking: Reported on 12/29/2014 10/07/14   Eber Hong, MD   BP 158/83 mmHg  Pulse 107  Temp(Src) 98.9 F (37.2 C) (Oral)  Resp 18  Ht  (1.727 m)  Wt 145 lb (65.772 kg)  BMI 22.05 kg/m2  SpO2 98% Physical Exam  2115: Physical examination:  Nursing notes reviewed; Vital signs and O2 SAT reviewed;  Constitutional: Well developed, Well nourished, Well hydrated, In no acute distress; Head:  Normocephalic, atraumatic; Eyes: EOMI, PERRL, No scleral icterus; ENMT: Mouth and pharynx normal, Mucous membranes moist; Neck: Supple, Full range of motion, No lymphadenopathy; Cardiovascular: Tachycardic rate and rhythm, No gallop; Respiratory: Breath sounds clear & equal bilaterally, No wheezes.  Speaking full sentences with ease, Normal respiratory effort/excursion; Chest: Nontender, Movement normal; Abdomen: Soft, Nontender, Nondistended, Normal bowel sounds; Genitourinary: No CVA tenderness; Extremities: Pulses normal, No tenderness, No edema, No calf edema or asymmetry.; Neuro: AA&Ox3, Major CN grossly intact.  Speech clear. No gross focal motor or sensory deficits in extremities.; Skin: Color normal, Warm, Dry.   ED Course  Procedures     EKG Interpretation None      MDM  MDM Reviewed: previous chart, nursing note and vitals Reviewed previous: labs Interpretation: labs     Results for orders placed or performed during the hospital encounter of 12/29/14  Comprehensive metabolic panel  Result Value Ref Range   Sodium 134 (L) 135 - 145 mmol/L   Potassium 4.2 3.5 - 5.1 mmol/L   Chloride 100 96 - 112 mmol/L   CO2 23 19 - 32 mmol/L   Glucose, Bld 95 70 - 99 mg/dL   BUN 7 6 - 23 mg/dL   Creatinine, Ser 1.61 0.50 - 1.35  mg/dL   Calcium 9.5 8.4 - 09.6 mg/dL   Total Protein 7.0 6.0 - 8.3 g/dL   Albumin 4.1 3.5 - 5.2 g/dL   AST 76 (H) 0 - 37 U/L   ALT 44 0 - 53 U/L   Alkaline Phosphatase 32 (L) 39 - 117 U/L   Total Bilirubin 0.4 0.3 - 1.2 mg/dL   GFR calc non Af Amer >90 >90 mL/min   GFR calc Af Amer >90 >90 mL/min   Anion gap 11 5 - 15  Ethanol  Result Value Ref Range   Alcohol, Ethyl (B) 36 (H) 0 - 9 mg/dL  CBC with Differential  Result Value Ref Range   WBC 6.7 4.0 - 10.5 K/uL   RBC 4.87 4.22 - 5.81 MIL/uL   Hemoglobin 15.0 13.0 - 17.0 g/dL   HCT 42.4  39.0 - 52.0 %   MCV 87.1 78.0 - 100.0 fL   MCH 30.8 26.0 - 34.0 pg   MCHC 35.4 30.0 - 36.0 g/dL   RDW 16.1 09.6 - 04.5 %   Platelets 195 150 - 400 K/uL   Neutrophils Relative % 39 (L) 43 - 77 %   Neutro Abs 2.6 1.7 - 7.7 K/uL   Lymphocytes Relative 45 12 - 46 %   Lymphs Abs 3.1 0.7 - 4.0 K/uL   Monocytes Relative 10 3 - 12 %   Monocytes Absolute 0.7 0.1 - 1.0 K/uL   Eosinophils Relative 5 0 - 5 %   Eosinophils Absolute 0.3 0.0 - 0.7 K/uL   Basophils Relative 1 0 - 1 %   Basophils Absolute 0.0 0.0 - 0.1 K/uL  Urine rapid drug screen (hosp performed)  Result Value Ref Range   Opiates NONE DETECTED NONE DETECTED   Cocaine POSITIVE (A) NONE DETECTED   Benzodiazepines POSITIVE (A) NONE DETECTED   Amphetamines NONE DETECTED NONE DETECTED   Tetrahydrocannabinol NONE DETECTED NONE DETECTED   Barbiturates NONE DETECTED NONE DETECTED  CK  Result Value Ref Range   Total CK 683 (H) 7 - 232 U/L    2310:  CK elevated; IVF NS bolus and gtt started. Pt with hx of rhabdomyolysis several times, tx with HD during one admission. Pt denies etoh withdrawal symptoms at this time.  Dx and testing d/w pt.  Questions answered.  Verb understanding, agreeable to admit.  T/C to [facility]. Sharl Ma, case discussed, including:  HPI, pertinent PM/SHx, VS/PE, dx testing, ED course and treatment:  Agreeable to admit, requests to write temporary orders, obtain medical bed to  team APAdmits.     Samuel Jester, DO 12/31/14 1712

## 2014-12-30 DIAGNOSIS — F1010 Alcohol abuse, uncomplicated: Secondary | ICD-10-CM | POA: Diagnosis present

## 2014-12-30 DIAGNOSIS — Z79899 Other long term (current) drug therapy: Secondary | ICD-10-CM | POA: Diagnosis not present

## 2014-12-30 DIAGNOSIS — M6282 Rhabdomyolysis: Secondary | ICD-10-CM | POA: Diagnosis not present

## 2014-12-30 DIAGNOSIS — M791 Myalgia: Secondary | ICD-10-CM | POA: Diagnosis present

## 2014-12-30 DIAGNOSIS — K7460 Unspecified cirrhosis of liver: Secondary | ICD-10-CM | POA: Diagnosis present

## 2014-12-30 DIAGNOSIS — F329 Major depressive disorder, single episode, unspecified: Secondary | ICD-10-CM | POA: Diagnosis present

## 2014-12-30 DIAGNOSIS — B1920 Unspecified viral hepatitis C without hepatic coma: Secondary | ICD-10-CM | POA: Diagnosis present

## 2014-12-30 DIAGNOSIS — G894 Chronic pain syndrome: Secondary | ICD-10-CM | POA: Diagnosis present

## 2014-12-30 DIAGNOSIS — F17210 Nicotine dependence, cigarettes, uncomplicated: Secondary | ICD-10-CM | POA: Diagnosis present

## 2014-12-30 LAB — COMPREHENSIVE METABOLIC PANEL
ALT: 36 U/L (ref 0–53)
ANION GAP: 7 (ref 5–15)
AST: 57 U/L — ABNORMAL HIGH (ref 0–37)
Albumin: 3.5 g/dL (ref 3.5–5.2)
Alkaline Phosphatase: 30 U/L — ABNORMAL LOW (ref 39–117)
BUN: 9 mg/dL (ref 6–23)
CHLORIDE: 105 mmol/L (ref 96–112)
CO2: 24 mmol/L (ref 19–32)
Calcium: 8.5 mg/dL (ref 8.4–10.5)
Creatinine, Ser: 0.79 mg/dL (ref 0.50–1.35)
Glucose, Bld: 120 mg/dL — ABNORMAL HIGH (ref 70–99)
Potassium: 4.1 mmol/L (ref 3.5–5.1)
Sodium: 136 mmol/L (ref 135–145)
Total Bilirubin: 0.6 mg/dL (ref 0.3–1.2)
Total Protein: 6 g/dL (ref 6.0–8.3)

## 2014-12-30 LAB — CBC
HEMATOCRIT: 39.8 % (ref 39.0–52.0)
Hemoglobin: 13.6 g/dL (ref 13.0–17.0)
MCH: 30.4 pg (ref 26.0–34.0)
MCHC: 34.2 g/dL (ref 30.0–36.0)
MCV: 88.8 fL (ref 78.0–100.0)
Platelets: 189 10*3/uL (ref 150–400)
RBC: 4.48 MIL/uL (ref 4.22–5.81)
RDW: 13.1 % (ref 11.5–15.5)
WBC: 6.8 10*3/uL (ref 4.0–10.5)

## 2014-12-30 LAB — CK: CK TOTAL: 457 U/L — AB (ref 7–232)

## 2014-12-30 MED ORDER — ONDANSETRON HCL 4 MG/2ML IJ SOLN: 4.0000 mg | Freq: Four times a day (QID) | INTRAMUSCULAR | Status: DC | PRN

## 2014-12-30 MED ORDER — ONDANSETRON HCL 4 MG PO TABS: 4.0000 mg | ORAL_TABLET | Freq: Four times a day (QID) | ORAL | Status: DC | PRN

## 2014-12-30 MED ORDER — OXYCODONE HCL 5 MG PO TABS
10.0000 mg | ORAL_TABLET | Freq: Four times a day (QID) | ORAL | Status: DC | PRN
  Administered 2014-12-30 (×2): 10 mg via ORAL
  Filled 2014-12-30 (×2): qty 2

## 2014-12-30 MED ORDER — OXYCODONE HCL 5 MG PO TABS
10.0000 mg | ORAL_TABLET | Freq: Once | ORAL | Status: AC
  Administered 2014-12-30: 10 mg via ORAL
  Filled 2014-12-30: qty 2

## 2014-12-30 MED ORDER — SODIUM CHLORIDE 0.9 % IV BOLUS (SEPSIS): 1000.0000 mL | Freq: Once | INTRAVENOUS | Status: AC

## 2014-12-30 MED ORDER — ENOXAPARIN SODIUM 40 MG/0.4ML ~~LOC~~ SOLN: 40.0000 mg | SUBCUTANEOUS | Status: DC

## 2014-12-30 MED ORDER — SODIUM CHLORIDE 0.9 % IV SOLN
INTRAVENOUS | Status: DC
  Administered 2014-12-30: 01:00:00 via INTRAVENOUS

## 2014-12-30 NOTE — Discharge Instructions (Signed)
Alcohol Intoxication °Alcohol intoxication occurs when you drink enough alcohol that it affects your ability to function. It can be mild or very severe. Drinking a lot of alcohol in a short time is called binge drinking. This can be very harmful. Drinking alcohol can also be more dangerous if you are taking medicines or other drugs. Some of the effects caused by alcohol may include: °· Loss of coordination. °· Changes in mood and behavior. °· Unclear thinking. °· Trouble talking (slurred speech). °· Throwing up (vomiting). °· Confusion. °· Slowed breathing. °· Twitching and shaking (seizures). °· Loss of consciousness. °HOME CARE °· Do not drive after drinking alcohol. °· Drink enough water and fluids to keep your pee (urine) clear or pale yellow. Avoid caffeine. °· Only take medicine as told by your doctor. °GET HELP IF: °· You throw up (vomit) many times. °· You do not feel better after a few days. °· You frequently have alcohol intoxication. Your doctor can help decide if you should see a substance use treatment counselor. °GET HELP RIGHT AWAY IF: °· You become shaky when you stop drinking. °· You have twitching and shaking. °· You throw up blood. It may look bright red or like coffee grounds. °· You notice blood in your poop (bowel movements). °· You become lightheaded or pass out (faint). °MAKE SURE YOU:  °· Understand these instructions. °· Will watch your condition. °· Will get help right away if you are not doing well or get worse. °Document Released: 02/21/2008 Document Revised: 05/07/2013 Document Reviewed: 02/07/2013 °[employer]® Patient Information ©2015 [employer]. This information is not intended to replace advice given to you by your health care provider. Make sure you discuss any questions you have with your health care provider. ° °

## 2014-12-30 NOTE — H&P (Signed)
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 Today came to the hospital with chief complaint of leg pain, patient has a history of myositis. Also has a history of alcohol abuse drinks 12 packs beer per day. He also did cocaine today. Denies chest pain or shortness of breath no nausea vomiting or diarrhea. In the ED he was found to have elevated CK 683. Patient also has a history of positive hepatitis C  Allergies:   Allergies  Allergen Reactions  . Acetaminophen Other (See Comments)    Liver disease       Past Medical History  Diagnosis Date  . Hepatitis C   . Myositis     Left deltoid biopsy at [facility] 10/11/11.  Marland Kitchen Cirrhosis   . Chronic pain syndrome   . 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 Alcohol abuse     Psychiatric admissions for alcohol and drug abuse  . History of cocaine abuse   . Depression     Multiple psychiatric admissions  . Tobacco abuse   . Tattoos   . Low TSH level January 2013    Normal free T4 of 1.06  . Back pain, chronic   . Acute renal failure     Secondary to rhabdo. Treated with dialysis short-term.  . Chronic leg pain   . Rhabdomyolysis     Past Surgical History  Procedure Laterality Date  . Hernia repair    . Muscle biopsy    . Appendectomy      Prior to Admission medications   Medication Sig Start Date End Date Taking? Authorizing Provider  acetaminophen (TYLENOL) 500 MG tablet Take 500 mg by mouth daily as needed for mild pain, moderate pain or headache.    Yes Historical Provider, MD  ibuprofen (ADVIL,MOTRIN) 200 MG tablet Take 800 mg by mouth daily as needed for headache or moderate pain.    Yes Historical  Provider, MD  ranitidine (ZANTAC) 150 MG tablet Take 150 mg by mouth daily as needed.   Yes Historical Provider, MD  cephALEXin (KEFLEX) 500 MG capsule Take 1 capsule (500 mg total) by mouth 4 (four) times daily. Patient not taking: Reported on 12/29/2014 10/07/14   Eber Hong, MD  sulfamethoxazole-trimethoprim (BACTRIM DS,SEPTRA DS) 800-160 MG per tablet Take 1 tablet by mouth 2 (two) times daily. Patient not taking: Reported on 12/29/2014 10/07/14   Devoria Albe, MD  traMADol (ULTRAM) 50 MG tablet Take 1 tablet (50 mg total) by mouth every 6 (six) hours as needed. Patient not taking: Reported on 12/29/2014 10/07/14   Eber Hong, MD    Social History:  reports that he has been smoking Cigarettes.  He has a 40 pack-year smoking history. He uses smokeless tobacco. He reports that he uses illicit drugs (Cocaine and Marijuana). He reports that he does not drink alcohol.  Family History  Problem Relation Age of Onset  . Hypertension Mother   . Cancer Father     throat cancer   . Diabetes Brother      All the positives are listed in BOLD  Review of Systems:  HEENT: Headache, blurred vision, runny nose, sore throat Neck: Hypothyroidism, hyperthyroidism,,lymphadenopathy Chest : Shortness of breath,  history of COPD, Asthma Heart : Chest pain, history of coronary arterey disease GI:  Nausea, vomiting, diarrhea, constipation, GERD GU: Dysuria, urgency, frequency of urination, hematuria Neuro: Stroke, seizures, syncope Psych: Depression, anxiety, hallucinations   Physical Exam: Blood pressure 113/81, pulse 94, temperature 98.1 F (36.7 C), temperature source Oral, resp. rate 18, height 5\' 8"  (1.727 m), weight 65.772 kg (145 lb), SpO2 93 %. Constitutional:   Patient is a well-developed and well-nourished *male in no acute distress and cooperative with exam. Head: Normocephalic and atraumatic Mouth: Mucus membranes moist Eyes: PERRL, EOMI, conjunctivae normal Neck: Supple, No  Thyromegaly Cardiovascular: RRR, S1 normal, S2 normal Pulmonary/Chest: CTAB, no wheezes, rales, or rhonchi Abdominal: Soft. Non-tender, non-distended, bowel sounds are normal, no masses, organomegaly, or guarding present.  Neurological: A&O x3, Strength is normal and symmetric bilaterally, cranial nerve II-XII are grossly intact, no focal motor deficit, sensory intact to light touch bilaterally.  Extremities : No Cyanosis, Clubbing or Edema  Labs on Admission:  Basic Metabolic Panel:  Recent Labs Lab 12/29/14 2211  NA 134*  K 4.2  CL 100  CO2 23  GLUCOSE 95  BUN 7  CREATININE 0.86  CALCIUM 9.5   Liver Function Tests:  Recent Labs Lab 12/29/14 2211  AST 76*  ALT 44  ALKPHOS 32*  BILITOT 0.4  PROT 7.0  ALBUMIN 4.1   No results for input(s): LIPASE, AMYLASE in the last 168 hours. No results for input(s): AMMONIA in the last 168 hours. CBC:  Recent Labs Lab 12/29/14 2211  WBC 6.7  NEUTROABS 2.6  HGB 15.0  HCT 42.4  MCV 87.1  PLT 195   Cardiac Enzymes:  Recent Labs Lab 12/29/14 2211  CKTOTAL 683*       Assessment/Plan Active Problems:   Rhabdomyolysis   alcohol abuse    Rhabdomyolysis Patient has mild rhabdomyolysis, CK 683. Will start IV normal saline at 125 per hour. Follow CK in the morning.  Alcohol abuse We'll start the patient on CIWA protocol  Chronic leg pain Start oxycodone 10 mg every 8 hours when necessary  Code status: Full code     Time Spent on Admission: 50 min  Charnee Turnipseed S [facility] Pager: [phone] 12/30/2014, 12:20 AM  If 7PM-7AM, please contact night-coverage  [email or web address]  Password [email or web address]

## 2014-12-30 NOTE — Clinical Social Work Note (Signed)
CSW received consult for substance abuse. RN also reported that pt needed a ride home, but was in waiting room because he didn't want to stay in his room any longer. CSW went to waiting room and his room and checked again with RN who said pt had left.   Derenda FennelKara Kendre Jacinto, KentuckyLCSW [phone]

## 2014-12-30 NOTE — Progress Notes (Signed)
NURSING PROGRESS NOTE  [name] [id number] Discharge Data: 12/30/2014 3:10 PM Attending Provider: No att. providers found PCP:No PCP Per Patient   [name] to be Good/C'Good Home per MD order.    All IV's discontinued and monitored for bleeding.  All belongings returned to patient for patient to take home.  AVS summary and prescriptions reviewed with patient.  Patient started leaving without a ride in place.  Encouraged patient to wait in the waiting room until we could work with social work. Called patient's spouse and mother, but no one could pick patient up.  Patient refused to wait and left floor ambulatory, and notified me via telephone when he arrived to his destination.   Last Documented Vital Signs:  Blood pressure 119/72, pulse 81, temperature 98.3 F (36.8 C), temperature source Oral, resp. rate 18, height 5\' 8"  (1.727 m), weight 64.82 kg (142 lb 14.4 oz), SpO2 98 %.  [name], [name]

## 2014-12-30 NOTE — Care Management Note (Signed)
    Page 1 of 1   12/30/2014     12:09:50 PM CARE MANAGEMENT NOTE 12/30/2014  Patient:  Erich MontaneIX,[place] W   Account Number:  [id number]  Date Initiated:  12/30/2014  Documentation initiated by:  Sharrie RothmanBLACKWELL,Mikaia Janvier C  Subjective/Objective Assessment:   Pt admitted from home with rhabo and etoh abuse. Pt lives with his mother and will return home at discharge. Pt is independent with ADL's. Pt stated that he does not have a PCP [place]     Action/Plan:   Pt discharged home today. [facility] No other CM needs noted.   Anticipated DC Date:  12/30/2014   Anticipated DC Plan:  HOME/SELF CARE      DC Planning Services  CM consult      Choice offered to / List presented to:             Status of service:  Completed, signed off Medicare Important Message given?   (If response is "NO", the following Medicare IM given date fields will be blank) Date Medicare IM given:   Medicare IM given by:   Date Additional Medicare IM given:   Additional Medicare IM given by:    Discharge Disposition:  HOME/SELF CARE  Per UR Regulation:    If discussed at Long Length of Stay Meetings, dates discussed:    Comments:  12/30/14 1205 Arlyss Queenammy Danyia Borunda, RN BSN CM

## 2014-12-30 NOTE — Discharge Summary (Signed)
Physician Discharge Summary  [name] ZOX:[id number] DOB: Jan 22, 1966 DOA: 12/29/2014  PCP: No PCP Per Patient  Admit date: 12/29/2014 Discharge date: 12/30/2014  Time spent: 40 minutes  Recommendations for Outpatient Follow-up:  1. Follow up with PCP as instructed for management of chronic pain and ETOH abuse.   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 Also has a history of alcohol abuse drinks 12 packs beer per day. He also did cocaine on day of admission. Denie chest pain or shortness of breath no nausea vomiting or diarrhea. In the ED he was found to have elevated CK 683. Patient also has a history of positive hepatitis C  Hospital Course:  Rhabdomyolysis Patient admitted with  mild rhabdomyolysis, CK 683 on admission and 457 at discharge after IV fluids. He has no PCP. Will request CM consult to assist with arranging follow up for chronic pain, myositis.  Alcohol abuse Drinks 12 beers daily. Was at [facility] 09/2014. Verbalizes desire to stop. Denies hx withdrawal seizure. Social work consult for OP follow up  Chronic leg pain Chart review indicates long hx of same. Would benefit from referral to pain clinic.     Procedures:  none  Consultations:  none  Discharge Exam: Filed Vitals:   12/30/14 0604  BP: 119/72  Pulse: 81  Temp: 98.3 F (36.8 C)  Resp: 18    General: well nourished appears comfortable Cardiovascular: RRR no MGR no LE edema Respiratory: normal effort BS distant and somewhat coarse. No wheez  Discharge Instructions   Discharge Instructions    Diet - low sodium heart healthy    Complete by:  As directed      Increase activity slowly    Complete by:  As directed           Current Discharge Medication List    CONTINUE these medications which have NOT CHANGED   Details  acetaminophen (TYLENOL) 500 MG tablet Take 500 mg by mouth daily as needed for mild pain, moderate pain or headache.     ibuprofen (ADVIL,MOTRIN) 200 MG tablet Take 800 mg by mouth daily as needed for headache or moderate pain.     ranitidine (ZANTAC) 150 MG tablet Take 150 mg by mouth daily as needed.      STOP taking these medications     cephALEXin (KEFLEX) 500 MG capsule      sulfamethoxazole-trimethoprim (BACTRIM DS,SEPTRA DS) 800-160 MG per tablet      traMADol (ULTRAM) 50 MG tablet        Allergies  Allergen Reactions  . Acetaminophen Other (See Comments)    Liver disease       The results of significant diagnostics from this hospitalization (including imaging, microbiology, ancillary and laboratory) are listed below  for reference.    Significant Diagnostic Studies: No results found.  Microbiology: No results found for this or any previous visit (from the past 240 hour(s)).   Labs: Basic Metabolic Panel:  Recent Labs Lab 12/29/14 2211 12/30/14 0639  NA 134* 136  K 4.2 4.1  CL 100 105  CO2 23 24  GLUCOSE 95 120*  BUN 7 9  CREATININE 0.86 0.79  CALCIUM 9.5 8.5   Liver Function Tests:  Recent Labs Lab 12/29/14 2211 12/30/14 0639  AST 76* 57*  ALT 44 36  ALKPHOS 32* 30*  BILITOT 0.4 0.6  PROT 7.0 6.0  ALBUMIN 4.1 3.5   No results for input(s):  LIPASE, AMYLASE in the last 168 hours. No results for input(s): AMMONIA in the last 168 hours. CBC:  Recent Labs Lab 12/29/14 2211 12/30/14 0639  WBC 6.7 6.8  NEUTROABS 2.6  --   HGB 15.0 13.6  HCT 42.4 39.8  MCV 87.1 88.8  PLT 195 189   Cardiac Enzymes:  Recent Labs Lab 12/29/14 2211 12/30/14 0643  CKTOTAL 683* 457*   BNP: BNP (last 3 results) No results for input(s): BNP in the last 8760 hours.  ProBNP (last 3 results) No results for input(s): PROBNP in the last 8760 hours.  CBG: No results for input(s): GLUCAP in the last 168 hours.     SignedGwenyth Bender:  BLACK,KAREN M  [facility] 12/30/2014, 11:03 AM

## 2014-12-30 NOTE — Progress Notes (Signed)
UR chart review completed.  

## 2015-01-07 ENCOUNTER — Inpatient Hospital Stay: Payer: Self-pay | Admitting: Internal Medicine

## 2015-03-26 ENCOUNTER — Encounter (HOSPITAL_COMMUNITY): Payer: Self-pay | Admitting: Emergency Medicine

## 2015-03-26 ENCOUNTER — Emergency Department (HOSPITAL_COMMUNITY)
Admission: EM | Admit: 2015-03-26 | Discharge: 2015-03-26 | Disposition: A | Discharge status: Home / Self Care | Payer: Medicare Other | Attending: Emergency Medicine | Admitting: Emergency Medicine

## 2015-03-26 DIAGNOSIS — Z720 Tobacco use: Secondary | ICD-10-CM | POA: Diagnosis not present

## 2015-03-26 DIAGNOSIS — G894 Chronic pain syndrome: Secondary | ICD-10-CM | POA: Insufficient documentation

## 2015-03-26 DIAGNOSIS — F1010 Alcohol abuse, uncomplicated: Secondary | ICD-10-CM | POA: Insufficient documentation

## 2015-03-26 DIAGNOSIS — Z8719 Personal history of other diseases of the digestive system: Secondary | ICD-10-CM | POA: Diagnosis not present

## 2015-03-26 DIAGNOSIS — Z8739 Personal history of other diseases of the musculoskeletal system and connective tissue: Secondary | ICD-10-CM | POA: Insufficient documentation

## 2015-03-26 DIAGNOSIS — Z87448 Personal history of other diseases of urinary system: Secondary | ICD-10-CM | POA: Diagnosis not present

## 2015-03-26 DIAGNOSIS — F1910 Other psychoactive substance abuse, uncomplicated: Secondary | ICD-10-CM

## 2015-03-26 DIAGNOSIS — Z008 Encounter for other general examination: Secondary | ICD-10-CM | POA: Diagnosis present

## 2015-03-26 DIAGNOSIS — F329 Major depressive disorder, single episode, unspecified: Secondary | ICD-10-CM | POA: Diagnosis not present

## 2015-03-26 DIAGNOSIS — Z8619 Personal history of other infectious and parasitic diseases: Secondary | ICD-10-CM | POA: Insufficient documentation

## 2015-03-26 DIAGNOSIS — F1410 Cocaine abuse, uncomplicated: Secondary | ICD-10-CM | POA: Diagnosis not present

## 2015-03-26 MED ORDER — DIAZEPAM 5 MG PO TABS
10.0000 mg | ORAL_TABLET | Freq: Once | ORAL | Status: AC
  Administered 2015-03-26: 10 mg via ORAL
  Filled 2015-03-26: qty 2

## 2015-03-26 MED ORDER — LORAZEPAM 1 MG PO TABS: 1.0000 mg | ORAL_TABLET | ORAL | Status: DC | PRN

## 2015-03-26 NOTE — ED Notes (Signed)
Pt reports he wants to go to detox. Pt did cocaine yesterday, last drink a couple of hours ago.

## 2015-03-26 NOTE — Discharge Instructions (Signed)
Chemical Dependency °Chemical dependency is an addiction to drugs or alcohol. It is characterized by the repeated behavior of seeking out and using drugs and alcohol despite harmful consequences to the health and safety of ones self and others.  °RISK FACTORS °There are certain situations or behaviors that increase a person's risk for chemical dependency. These include: °· A family history of chemical dependency. °· A history of mental health issues, including depression and anxiety. °· A home environment where drugs and alcohol are easily available to you. °· Drug or alcohol use at a young age. °SYMPTOMS  °The following symptoms can indicate chemical dependency: °· Inability to limit the use of drugs or alcohol. °· Nausea, sweating, shakiness, and anxiety that occurs when alcohol or drugs are not being used. °· An increase in amount of drugs or alcohol that is necessary to get drunk or high. °People who experience these symptoms can assess their use of drugs and alcohol by asking themselves the following questions: °· Have you been told by friends or family that they are worried about your use of alcohol or drugs? °· Do friends and family ever tell you about things you did while drinking alcohol or using drugs that you do not remember? °· Do you lie about using alcohol or drugs or about the amounts you use? °· Do you have difficulty completing daily tasks unless you use alcohol or drugs? °· Is the level of your work or school performance lower because of your drug or alcohol use? °· Do you get sick from using drugs or alcohol but keep using anyway? °· Do you feel uncomfortable in social situations unless you use alcohol or drugs? °· Do you use drugs or alcohol to help forget problems?  °An answer of yes to any of these questions may indicate chemical dependency. Professional evaluation is suggested. °Document Released: 08/29/2001 Document Revised: 11/27/2011 Document Reviewed: 11/10/2010 °ExitCare® Patient  Information ©2015 [employer]. This information is not intended to replace advice given to you by your health care provider. Make sure you discuss any questions you have with your health care provider. ° °Emergency Department Resource Guide °1) Find a Doctor and Pay Out of Pocket °Although you won't have to find out who is covered by your insurance plan, it is a good idea to ask around and get recommendations. You will then need to call the office and see if the doctor you have chosen will accept you as a new patient and what types of options they offer for patients who are self-pay. Some doctors offer discounts or will set up payment plans for their patients who do not have insurance, but you will need to ask so you aren't surprised when you get to your appointment. ° °2) Contact Your Local Health Department °Not all health departments have doctors that can see patients for sick visits, but many do, so it is worth a call to see if yours does. If you don't know where your local health department is, you can check in your phone book. The CDC also has a tool to help you locate your state's health department, and many state websites also have listings of all of their local health departments. ° °3) Find a Walk-in Clinic °If your illness is not likely to be very severe or complicated, you may want to try a walk in clinic. These are popping up all over the country in pharmacies, drugstores, and shopping centers. They're usually staffed by nurse practitioners or physician assistants that have been   trained to treat common illnesses and complaints. They're usually fairly quick and inexpensive. However, if you have serious medical issues or chronic medical problems, these are probably not your best option. ° °No Primary Care Doctor: °- Call [facility] at  832-8000 - they can help you locate a primary care doctor that  accepts your insurance, provides certain services, etc. °- Physician Referral Service-  1-800-533-3463 ° °Chronic Pain Problems: °Organization         Address  Phone   Notes  °[facility]  (336) 297-2271 Patients need to be referred by their primary care doctor.  ° °Medication Assistance: °Organization         Address  Phone   Notes  °[place] [facility] [address] °[place], [place] [address] (336) 641-8030 --Must be a resident of [place] °-- Must have NO insurance coverage whatsoever (no Medicaid/ Medicare, etc.) °-- The pt. MUST have a primary care doctor that directs their care regularly and follows them in the community °  °[facility]  (866) 331-1348   °[employer]  (888) 892-1162   ° °Agencies that provide inexpensive medical care: °Organization         Address  Phone   Notes  °[facility]  (336) 832-8035   °[facility]    (336) 832-7272   °[facility] [address] °[place], [place] [address] (336) 832-4777   °[facility] [address], °[place] (336) 271-4999   °[facility]    (336) 373-0678   °[facility]    (336) 272-1050   °[facility] ° [address], [place] Phone:  (336) 832-4444, Fax:  (336) 832-4440 Hours of Operation:  9 am - 6 pm, M-F.  Also accepts Medicaid/Medicare and self-pay.  °[facility] ° [address], [place] Phone: (336) 832-3150, Fax: (336) 832-3151. Hours of Operation:  8:30 am - 5:30 pm, M-F.  Also accepts Medicaid and self-pay.  °[facility] [place] [address], [place] Phone: (336) 878-6027   °[facility] [address], [place], [place] (336)723-1848, Ext. 123 Mondays & Thursdays: 7-9 AM.  First 15 patients are seen on a first come, first serve basis. °  ° °Medicaid-accepting [place] Providers: ° °Organization         Address  Phone   Notes  °[facility] [address],  [place] (336) 641-2100 Also accepts self-pay patients.  °[facility] [address], [place] ° (336) 856-9996   °[facility] [address], [place] (336) 288-8857   °[facility] [address] [place] [address], [place] (336) 299-7000   °Veita Bland [address], [place]  ° (336) 373-1557 Only accepts [place] Access Medicaid patients after they have their name applied to their card.  ° °Self-Pay (no insurance) in [place]: ° °Organization         Address  Phone   Notes  °Sickle Cell Patients, [facility] [address], [place] (336) 832-1970   °[facility] [address], [place] (336) 832-4400   °[facility] [place] ° [address], [place] (336) 992-4800   °[facility]/Dr. Osei-Bonsu ° [address] [place] [address], [place] or [address], [place] (336) 841-8500 Phone number for both [place] and [place] locations is the same.  °[facility] [address], [place] (336) 299-0000   °[facility] [address] [place] [address], [place] or [address] (336) 852-7530 °(336) 878-2260   °[facility] [address], [place] (336) 350-1642, phone; (336) 294-5005, fax Sees patients 1st and 3rd Saturday of every month.  Must not qualify for public or private insurance (i.e. Medicaid, Medicare, [place] Health Choice, Veterans' Benefits) • Household income should be no more than 200% of the poverty level •The clinic cannot treat you if you are pregnant or think you are pregnant • Sexually transmitted diseases are not treated at the clinic.  ° ° °Dental Care: °Organization         Address  Phone  Notes  °[facility] [address], [place] (336) 641-6152 Accepts children up to age 21 who are enrolled in  Medicaid or [place] Health Choice; pregnant women with a Medicaid card; and children who have applied for Medicaid or [place] Health Choice, but were declined, whose parents can pay a reduced fee at time of service.  °[facility] [place]  [address], [place] (336) 641-7733 Accepts children up to age 21 who are enrolled in Medicaid or [place] Health Choice; pregnant women with a Medicaid card; and children who have applied for Medicaid or [place] Health Choice, but were declined, whose parents can pay a reduced fee at time of service.  °[facility] ° [address], [place] (336) 641-4533 Patients are seen by appointment only. Walk-ins are not accepted. [facility] will see patients 18 years of age and older. °Monday - Tuesday (8am-5pm) °Most Wednesdays (8:30-5pm) °$30 per visit, cash only  °[facility] ° [address], [place] (336) 641-4533 Patients are seen by appointment only. Walk-ins are not accepted. [facility] will see patients 18 years of age and older. °One Wednesday Evening (Monthly: Volunteer Based).  $30 per visit, cash only  °[facility]  (919) 537-3737 for adults; Children under age 4, call Graduate Pediatric Dentistry at (919) [phone]. Children aged 4-14, please call (919) [phone] to request a pediatric application. ° Dental services are provided in all areas of dental care including fillings, crowns and bridges, complete and partial dentures, implants, gum treatment, root canals, and extractions. Preventive care is also provided. Treatment is provided to both adults and children. °Patients are selected via a lottery and there is often a waiting list. °  °[facility] [address], °[place] ° (336) 763-8833 [email or web address] °  °[facility] [address], [place], [place] (336)723-1848, Ext. 123 Second and Fourth Thursday of each month, opens at 6:30  AM; Clinic ends at 9 AM.  Patients are seen on a first-come first-served basis, and a limited number are seen during each clinic.  ° °[facility] ° [address], [place],  (336) 723-7904   Eligibility Requirements °You must have lived in [place], [place], or [place] counties for at least the last three months. °  You cannot be eligible for state or federal sponsored healthcare insurance, including Veterans Administration, Medicaid, or Medicare. °  You generally cannot be eligible for healthcare insurance through your employer.  °  How to apply: °Eligibility screenings are   held every Tuesday and Wednesday afternoon from 1:00 pm until 4:00 pm. You do not need an appointment for the interview!  °[facility] [address], [place], [place] 336-631-2330   °[facility]  336-342-8273   °[facility]  336-703-3100   °[facility]  336-570-6415   ° °Behavioral Health Resources in the Community: °Intensive Outpatient Programs °Organization         Address  Phone  Notes  °[facility] [address], [place], [place] 336-878-6098   °[facility] [address], [place], [place] 336-832-9800   °[facility] [address], [place], [place] ° 336-882-2125   °[facility] [address],  °[place], [place] 1-800-853-5163 or 336-641-4981   °Substance Abuse Resources °Organization         Address  Phone  Notes  °[facility]  336-882-2125   °[facility]  336-784-9470   °[facility]  336-285-9073   °[facility]  336-845-3988   °Residential & Outpatient Substance Abuse Program  1-800-659-3381   °Psychological Services °Organization         Address  Phone  Notes  °[facility]  336- 832-9600   °[facility]  336- 378-7881   °[facility] [address], [place] 1-800-853-5163 or  336-641-4981   ° °Mobile Crisis Teams °Organization         Address  Phone  Notes  °[facility], Mobile Crisis Care Unit  1-877-626-1772   °Assertive °[facility] ° [address] [place], [place] 336-834-9664   °Sharon DeEsch [address] °[place] 336-554-5454   ° °Self-Help/Support Groups °Organization         Address  Phone             Notes  °[facility] - variety of support groups  336- 373-1402 Call for more information  °Narcotics Anonymous (NA), [facility] [address], °[place]  2 meetings at this location  ° °Residential Treatment Programs °Organization         Address  Phone  Notes  °[facility] [address],    °[place]  1-866-801-8205   °[facility] ° [address], [place], [place] 704-293-8524   °[facility] [address], [place] 336-845-3988 Admissions: 8am-3pm M-F  °[facility] [address]    °[place], [place] 336-841-1104   °[facility] [address], [place], [place] 336-379-7146   °[facility] [address]  °[place], [place] 336-285-9073   °[facility] - Intensive Outpatient [address], [place], [place] 336-852-3033   °[facility] [address]  °[place], [place] 1-877-615-2722 or 336-784-9470   °[facility] [address] [place], [place] 336-227-7417 Accepts Medicaid  °[facility] [address]  °[place] 1-800-659-3381 Substance Abuse/Addiction Treatment  ° °[place] Behavioral Health Resources °Organization         Address  Phone  Notes  °[facility]  (888) 581-9988   °Julie Brannon, PhD [address] [place], [place]   (336) 349-5553 or (336) 951-0000   °[facility]   [address] °[place], [place] (336) 349-4454   °[facility] [address],  [place], [place] (336) 342-8316 Insurance/Medicaid/sponsorship   through [facility]  °[facility] [address]                                    [place], [place] (336) 342-8316 Therapy/tele-psych/case  °[facility] [address]  ° [place], [place] (336) 349-2233    °Dr. Arfeen  (336) 349-4544   °[facility] [place]  [employer] [place] [facility] 1) [address],  °2) [address], [place] °3)  [address], [place] (336) 349-3220 °(336) 342-7768 ° °(336) 342-8140   °[place] [facility] (336) 342-1394 or (336) 342-3537 (After Hours)    ° ° °

## 2015-03-26 NOTE — ED Notes (Signed)
MD at bedside. 

## 2015-04-02 NOTE — ED Provider Notes (Signed)
CSN: [id number]     Arrival date & time 03/26/15  1213 History   First MD Initiated Contact with Patient 03/26/15 1225     Chief Complaint  Patient presents with  . Medical Clearance     (Consider location/radiation/quality/duration/timing/severity/associated sxs/prior Treatment) HPI   49 year old male requesting alcohol and cocaine detox. Last used cocaine last night. Drink alcohol a few hours prior to arrival. Has been abusing drugs and alcohol for years. Denies any specific stressor leading to visit today. Feels depressed, but denies suicidal or homicidal thoughts. Denies any hallucinations.  Past Medical History  Diagnosis Date  . Hepatitis C   . Myositis     [facility].  Marland Kitchen. Cirrhosis   . Chronic pain syndrome   . 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 Alcohol abuse     Psychiatric admissions for alcohol and drug abuse  . History of cocaine abuse   . Depression     Multiple psychiatric admissions  . Tobacco abuse   . Tattoos   . Low TSH level January 2013    Normal free T4 of 1.06  . Back pain, chronic   . Acute renal failure     Secondary to rhabdo. Treated with dialysis short-term.  . Chronic leg pain   . Rhabdomyolysis    Past Surgical History  Procedure Laterality Date  . Hernia repair    . Muscle biopsy    . Appendectomy     Family History  Problem Relation Age of Onset  . Hypertension Mother   . Cancer Father     throat cancer   . Diabetes Brother    History  Substance Use Topics  . Smoking status: Current Every Day Smoker -- 1.00 packs/day for 40 years    Types: Cigarettes  . Smokeless tobacco: Current User  . Alcohol Use: No     Comment: thirty five days sober    Review of Systems  All systems reviewed and negative, other than as noted in HPI.   Allergies  Acetaminophen  Home Medications   Prior to Admission medications   Medication Sig Start Date End Date Taking? Authorizing Provider  ibuprofen (ADVIL,MOTRIN) 200 MG  tablet Take 800 mg by mouth daily as needed for headache or moderate pain.    Yes Historical Provider, MD  LORazepam (ATIVAN) 1 MG tablet Take 1 tablet (1 mg total) by mouth every 4 (four) hours as needed for anxiety. 03/26/15   Raeford RazorStephen Ilaria Much, MD   BP 154/96 mmHg  Pulse 77  Temp(Src) 98 F (36.7 C) (Oral)  Resp 18  Ht 5\' 8"  (1.727 m)  Wt 140 lb (63.504 kg)  BMI 21.29 kg/m2  SpO2 100% Physical Exam  Constitutional: He is oriented to person, place, and time. He appears well-developed and well-nourished. No distress.  HENT:  Head: Normocephalic and atraumatic.  Eyes: Conjunctivae are normal. Right eye exhibits no discharge. Left eye exhibits no discharge.  Neck: Neck supple.  Cardiovascular: Normal rate, regular rhythm and normal heart sounds.  Exam reveals no gallop and no friction rub.   No murmur heard. Pulmonary/Chest: Effort normal and breath sounds normal. No respiratory distress.  Abdominal: Soft. He exhibits no distension. There is no tenderness.  Musculoskeletal: He exhibits no edema or tenderness.  Neurological: He is alert and oriented to person, place, and time. No cranial nerve deficit. He exhibits normal muscle tone. Coordination normal.  Mildly tremulous, otherwise neuro exam is nonfocal.  Skin: Skin is warm and dry.  Psychiatric:  He has a normal mood and affect. His behavior is normal. Thought content normal.  Speech clear. Content appropriate. Follows commands.  Nursing note and vitals reviewed.   ED Course  Procedures (including critical care time) Labs Review Labs Reviewed - No data to display  Imaging Review No results found.   EKG Interpretation None      MDM   Final diagnoses:  Polysubstance abuse    49 year old male requesting detox. He is not suicidal, homocidal or psychotic. Will discharge with outpatient resources.    Raeford Razor, MD 04/02/15 [phone]

## 2015-09-22 ENCOUNTER — Inpatient Hospital Stay (HOSPITAL_COMMUNITY)
Admission: EM | Admit: 2015-09-22 | Discharge: 2015-09-23 | DRG: 558 | Disposition: A | Discharge status: Home / Self Care | Payer: Medicare Other | Attending: Internal Medicine | Admitting: Internal Medicine

## 2015-09-22 ENCOUNTER — Emergency Department (HOSPITAL_COMMUNITY): Payer: Medicare Other

## 2015-09-22 ENCOUNTER — Encounter (HOSPITAL_COMMUNITY): Payer: Self-pay | Admitting: *Deleted

## 2015-09-22 DIAGNOSIS — G8929 Other chronic pain: Secondary | ICD-10-CM | POA: Diagnosis present

## 2015-09-22 DIAGNOSIS — R945 Abnormal results of liver function studies: Secondary | ICD-10-CM

## 2015-09-22 DIAGNOSIS — M545 Low back pain, unspecified: Secondary | ICD-10-CM | POA: Diagnosis present

## 2015-09-22 DIAGNOSIS — B182 Chronic viral hepatitis C: Secondary | ICD-10-CM | POA: Diagnosis not present

## 2015-09-22 DIAGNOSIS — M609 Myositis, unspecified: Secondary | ICD-10-CM | POA: Diagnosis present

## 2015-09-22 DIAGNOSIS — F17210 Nicotine dependence, cigarettes, uncomplicated: Secondary | ICD-10-CM | POA: Diagnosis present

## 2015-09-22 DIAGNOSIS — K7460 Unspecified cirrhosis of liver: Secondary | ICD-10-CM | POA: Diagnosis present

## 2015-09-22 DIAGNOSIS — M79605 Pain in left leg: Secondary | ICD-10-CM | POA: Diagnosis not present

## 2015-09-22 DIAGNOSIS — M51369 Other intervertebral disc degeneration, lumbar region without mention of lumbar back pain or lower extremity pain: Secondary | ICD-10-CM | POA: Diagnosis present

## 2015-09-22 DIAGNOSIS — E871 Hypo-osmolality and hyponatremia: Secondary | ICD-10-CM | POA: Diagnosis present

## 2015-09-22 DIAGNOSIS — R52 Pain, unspecified: Secondary | ICD-10-CM | POA: Diagnosis not present

## 2015-09-22 DIAGNOSIS — F1010 Alcohol abuse, uncomplicated: Secondary | ICD-10-CM | POA: Diagnosis present

## 2015-09-22 DIAGNOSIS — Z720 Tobacco use: Secondary | ICD-10-CM | POA: Diagnosis present

## 2015-09-22 DIAGNOSIS — T796XXA Traumatic ischemia of muscle, initial encounter: Secondary | ICD-10-CM | POA: Diagnosis not present

## 2015-09-22 DIAGNOSIS — M791 Myalgia: Secondary | ICD-10-CM

## 2015-09-22 DIAGNOSIS — Z886 Allergy status to analgesic agent status: Secondary | ICD-10-CM

## 2015-09-22 DIAGNOSIS — M6282 Rhabdomyolysis: Secondary | ICD-10-CM | POA: Diagnosis not present

## 2015-09-22 DIAGNOSIS — IMO0001 Reserved for inherently not codable concepts without codable children: Secondary | ICD-10-CM | POA: Diagnosis present

## 2015-09-22 DIAGNOSIS — F1490 Cocaine use, unspecified, uncomplicated: Secondary | ICD-10-CM | POA: Diagnosis present

## 2015-09-22 DIAGNOSIS — F17200 Nicotine dependence, unspecified, uncomplicated: Secondary | ICD-10-CM

## 2015-09-22 DIAGNOSIS — M5136 Other intervertebral disc degeneration, lumbar region: Secondary | ICD-10-CM | POA: Diagnosis present

## 2015-09-22 DIAGNOSIS — T07 Unspecified multiple injuries: Secondary | ICD-10-CM | POA: Diagnosis not present

## 2015-09-22 DIAGNOSIS — M5126 Other intervertebral disc displacement, lumbar region: Secondary | ICD-10-CM | POA: Diagnosis present

## 2015-09-22 DIAGNOSIS — D696 Thrombocytopenia, unspecified: Secondary | ICD-10-CM

## 2015-09-22 DIAGNOSIS — G894 Chronic pain syndrome: Secondary | ICD-10-CM | POA: Diagnosis present

## 2015-09-22 DIAGNOSIS — R000 Tachycardia, unspecified: Secondary | ICD-10-CM | POA: Diagnosis not present

## 2015-09-22 DIAGNOSIS — F329 Major depressive disorder, single episode, unspecified: Secondary | ICD-10-CM | POA: Diagnosis present

## 2015-09-22 DIAGNOSIS — M5116 Intervertebral disc disorders with radiculopathy, lumbar region: Secondary | ICD-10-CM | POA: Diagnosis present

## 2015-09-22 DIAGNOSIS — M79604 Pain in right leg: Secondary | ICD-10-CM | POA: Diagnosis not present

## 2015-09-22 DIAGNOSIS — Z23 Encounter for immunization: Secondary | ICD-10-CM

## 2015-09-22 DIAGNOSIS — R7989 Other specified abnormal findings of blood chemistry: Secondary | ICD-10-CM | POA: Diagnosis present

## 2015-09-22 LAB — CBC WITH DIFFERENTIAL/PLATELET
Basophils Absolute: 0 10*3/uL (ref 0.0–0.1)
Basophils Relative: 0 %
EOS ABS: 0.2 10*3/uL (ref 0.0–0.7)
EOS PCT: 3 %
HCT: 39.2 % (ref 39.0–52.0)
HEMOGLOBIN: 14 g/dL (ref 13.0–17.0)
Lymphocytes Relative: 28 %
Lymphs Abs: 2 10*3/uL (ref 0.7–4.0)
MCH: 32 pg (ref 26.0–34.0)
MCHC: 35.7 g/dL (ref 30.0–36.0)
MCV: 89.5 fL (ref 78.0–100.0)
MONOS PCT: 13 %
Monocytes Absolute: 0.9 10*3/uL (ref 0.1–1.0)
NEUTROS ABS: 4 10*3/uL (ref 1.7–7.7)
Neutrophils Relative %: 56 %
Platelets: 139 10*3/uL — ABNORMAL LOW (ref 150–400)
RBC: 4.38 MIL/uL (ref 4.22–5.81)
RDW: 12.2 % (ref 11.5–15.5)
WBC: 7.1 10*3/uL (ref 4.0–10.5)

## 2015-09-22 LAB — COMPREHENSIVE METABOLIC PANEL
ALK PHOS: 31 U/L — AB (ref 38–126)
ALT: 184 U/L — AB (ref 17–63)
ANION GAP: 8 (ref 5–15)
AST: 396 U/L — ABNORMAL HIGH (ref 15–41)
Albumin: 4.4 g/dL (ref 3.5–5.0)
BUN: 9 mg/dL (ref 6–20)
CALCIUM: 9.3 mg/dL (ref 8.9–10.3)
CO2: 28 mmol/L (ref 22–32)
CREATININE: 0.72 mg/dL (ref 0.61–1.24)
Chloride: 93 mmol/L — ABNORMAL LOW (ref 101–111)
Glucose, Bld: 112 mg/dL — ABNORMAL HIGH (ref 65–99)
Potassium: 4.3 mmol/L (ref 3.5–5.1)
SODIUM: 129 mmol/L — AB (ref 135–145)
TOTAL PROTEIN: 7.6 g/dL (ref 6.5–8.1)
Total Bilirubin: 0.9 mg/dL (ref 0.3–1.2)

## 2015-09-22 LAB — VITAMIN B12: VITAMIN B 12: 206 pg/mL (ref 180–914)

## 2015-09-22 LAB — CK: CK TOTAL: 9207 U/L — AB (ref 49–397)

## 2015-09-22 LAB — TSH: TSH: 1.124 u[IU]/mL (ref 0.350–4.500)

## 2015-09-22 MED ORDER — ADULT MULTIVITAMIN W/MINERALS CH
1.0000 | ORAL_TABLET | Freq: Every day | ORAL | Status: DC
  Administered 2015-09-22 – 2015-09-23 (×2): 1 via ORAL
  Filled 2015-09-22 (×2): qty 1

## 2015-09-22 MED ORDER — LORAZEPAM 2 MG/ML IJ SOLN
1.0000 mg | Freq: Four times a day (QID) | INTRAMUSCULAR | Status: DC | PRN
  Administered 2015-09-23: 1 mg via INTRAVENOUS
  Filled 2015-09-22: qty 1

## 2015-09-22 MED ORDER — FOLIC ACID 1 MG PO TABS
1.0000 mg | ORAL_TABLET | Freq: Every day | ORAL | Status: DC
  Administered 2015-09-22 – 2015-09-23 (×2): 1 mg via ORAL
  Filled 2015-09-22 (×2): qty 1

## 2015-09-22 MED ORDER — ALUM & MAG HYDROXIDE-SIMETH 200-200-20 MG/5ML PO SUSP
30.0000 mL | Freq: Four times a day (QID) | ORAL | Status: DC | PRN
  Administered 2015-09-23: 30 mL via ORAL
  Filled 2015-09-22: qty 30

## 2015-09-22 MED ORDER — SENNA 8.6 MG PO TABS
1.0000 | ORAL_TABLET | Freq: Every day | ORAL | Status: DC
  Administered 2015-09-22: 8.6 mg via ORAL
  Filled 2015-09-22: qty 1

## 2015-09-22 MED ORDER — INFLUENZA VAC SPLIT QUAD 0.5 ML IM SUSY
0.5000 mL | PREFILLED_SYRINGE | INTRAMUSCULAR | Status: AC
  Administered 2015-09-23: 0.5 mL via INTRAMUSCULAR
  Filled 2015-09-22: qty 0.5

## 2015-09-22 MED ORDER — SODIUM CHLORIDE 0.9 % IV BOLUS (SEPSIS)
1000.0000 mL | Freq: Once | INTRAVENOUS | Status: AC
  Administered 2015-09-22: 1000 mL via INTRAVENOUS

## 2015-09-22 MED ORDER — ONDANSETRON HCL 4 MG/2ML IJ SOLN: 4.0000 mg | Freq: Four times a day (QID) | INTRAMUSCULAR | Status: DC | PRN

## 2015-09-22 MED ORDER — THIAMINE HCL 100 MG/ML IJ SOLN: 100.0000 mg | Freq: Every day | INTRAMUSCULAR | Status: DC

## 2015-09-22 MED ORDER — SODIUM CHLORIDE 0.9 % IV SOLN
INTRAVENOUS | Status: DC
  Administered 2015-09-22 – 2015-09-23 (×2): via INTRAVENOUS

## 2015-09-22 MED ORDER — OXYCODONE HCL 5 MG PO TABS
10.0000 mg | ORAL_TABLET | ORAL | Status: DC | PRN
  Administered 2015-09-22 – 2015-09-23 (×5): 10 mg via ORAL
  Filled 2015-09-22 (×5): qty 2

## 2015-09-22 MED ORDER — LORAZEPAM 1 MG PO TABS
1.0000 mg | ORAL_TABLET | Freq: Four times a day (QID) | ORAL | Status: DC | PRN
  Administered 2015-09-23: 1 mg via ORAL
  Filled 2015-09-22 (×2): qty 1

## 2015-09-22 MED ORDER — ALBUTEROL SULFATE (2.5 MG/3ML) 0.083% IN NEBU: 2.5000 mg | INHALATION_SOLUTION | RESPIRATORY_TRACT | Status: DC | PRN

## 2015-09-22 MED ORDER — ONDANSETRON HCL 4 MG PO TABS: 4.0000 mg | ORAL_TABLET | Freq: Four times a day (QID) | ORAL | Status: DC | PRN

## 2015-09-22 MED ORDER — KETOROLAC TROMETHAMINE 30 MG/ML IJ SOLN
30.0000 mg | Freq: Once | INTRAMUSCULAR | Status: AC
  Administered 2015-09-22: 30 mg via INTRAVENOUS
  Filled 2015-09-22: qty 1

## 2015-09-22 MED ORDER — VITAMIN B-1 100 MG PO TABS
100.0000 mg | ORAL_TABLET | Freq: Every day | ORAL | Status: DC
  Administered 2015-09-22 – 2015-09-23 (×2): 100 mg via ORAL
  Filled 2015-09-22 (×2): qty 1

## 2015-09-22 MED ORDER — MORPHINE SULFATE (PF) 4 MG/ML IV SOLN
4.0000 mg | Freq: Once | INTRAVENOUS | Status: AC
  Administered 2015-09-22: 4 mg via INTRAVENOUS
  Filled 2015-09-22: qty 1

## 2015-09-22 MED ORDER — SODIUM CHLORIDE 0.9 % IV SOLN
INTRAVENOUS | Status: AC
  Administered 2015-09-22: 13:00:00 via INTRAVENOUS

## 2015-09-22 MED ORDER — PREDNISONE 20 MG PO TABS
60.0000 mg | ORAL_TABLET | Freq: Every day | ORAL | Status: DC
  Administered 2015-09-22 – 2015-09-23 (×2): 60 mg via ORAL
  Filled 2015-09-22 (×2): qty 3

## 2015-09-22 MED ORDER — LORAZEPAM 2 MG/ML IJ SOLN
1.0000 mg | Freq: Once | INTRAMUSCULAR | Status: AC
  Administered 2015-09-22: 1 mg via INTRAVENOUS
  Filled 2015-09-22: qty 1

## 2015-09-22 NOTE — ED Provider Notes (Signed)
CSN: [id number]     Arrival date & time 09/22/15  1011 History  By signing my name below, I, [name], attest that this documentation has been prepared under the direction and in the presence of Geoffery Lyonsouglas Nicklas Mcsweeney, MD. Electronically Signed: Phillis HaggisGabriella Good, ED Scribe. 09/22/2015. 11:37 AM.   Chief Complaint  Patient presents with  . Leg Pain   Patient is a 50 y.o. male presenting with leg pain. The history is provided by the patient.  Leg Pain Location:  Leg Injury: no   Leg location:  L leg and R leg Pain details:    Severity:  Mild   Onset quality:  Gradual   Timing:  Constant   Progression:  Worsening Chronicity:  New Dislocation: no   Foreign body present:  No foreign bodies Prior injury to area:  No Ineffective treatments:  None tried Associated symptoms: no fever   HPI Comments: [name] is a 50 y.o. male with a hx of Hepatitis C, Myositis, Cirrhosis, chronic pain syndrome, polysubstance abuse, chronic leg pain, acute renal failure, and rhabdomyolysis who presents to the Emergency Department complaining of bilateral leg pain onset 3 days ago. Pt states that he was getting out of a car when his legs gave out underneath him. He reports associated bruising and abrasions to the bilateral legs and states that they have been hurting ever since. He reports drinking on the night of the fall. He states that he has had falls similar to this before, due to drinking. Pt reports drinking 2 drinks of Whiskey this morning. He states that he has been able to ambulate with difficulty. He denies fever, chills, hitting head or LOC.   Past Medical History  Diagnosis Date  . Hepatitis C   . Myositis     [facility].  Marland Kitchen. Cirrhosis (HCC)   . Chronic pain syndrome   . Polymyositis [facility](HCC) January 2011  . Alcohol abuse     Psychiatric admissions for alcohol and drug abuse  . History of cocaine abuse   . Depression     Multiple psychiatric admissions  . Tobacco abuse   .  Tattoos   . Low TSH level January 2013    Normal free T4 of 1.06  . Back pain, chronic   . Acute renal failure (HCC)     Secondary to rhabdo. Treated with dialysis short-term.  . Chronic leg pain   . Rhabdomyolysis    Past Surgical History  Procedure Laterality Date  . Hernia repair    . Muscle biopsy    . Appendectomy     Family History  Problem Relation Age of Onset  . Hypertension Mother   . Cancer Father     throat cancer   . Diabetes Brother    Social History  Substance Use Topics  . Smoking status: Current Every Day Smoker -- 1.00 packs/day for 40 years    Types: Cigarettes  . Smokeless tobacco: Current User  . Alcohol Use: No     Comment: thirty five days sober    Review of Systems  Constitutional: Negative for fever and chills.  Musculoskeletal: Positive for arthralgias.  Neurological: Negative for syncope.  All other systems reviewed and are negative.  Allergies  Acetaminophen  Home Medications   Prior to Admission medications   Medication Sig Start Date End Date Taking? Authorizing Provider  ibuprofen (ADVIL,MOTRIN) 200 MG tablet Take 800 mg by mouth daily as needed for headache or moderate pain.  Historical Provider, MD  LORazepam (ATIVAN) 1 MG tablet Take 1 tablet (1 mg total) by mouth every 4 (four) hours as needed for anxiety. 03/26/15   Raeford Razor, MD   BP 129/99 mmHg  Pulse 110  Temp(Src) 97.9 F (36.6 C) (Oral)  Resp 14  Ht 5\' 8"  (1.727 m)  Wt 145 lb (65.772 kg)  BMI 22.05 kg/m2  SpO2 100% Physical Exam  Constitutional: He is oriented to person, place, and time. He appears well-developed and well-nourished.  HENT:  Head: Normocephalic and atraumatic.  Eyes: EOM are normal.  Neck: Normal range of motion.  Cardiovascular: Normal rate, regular rhythm, normal heart sounds and intact distal pulses.   Pulmonary/Chest: Effort normal and breath sounds normal. No respiratory distress.  Abdominal: Soft. He exhibits no distension. There is no  tenderness.  Musculoskeletal: Normal range of motion.  Superficial abrasions to both knees. Knees appear otherwise grossly normal without effusion. Both knee joints appear stable with good ROM. There is no significant muscle tenderness to the thighs or calves.  Neurological: He is alert and oriented to person, place, and time.  Skin: Skin is warm and dry.  Psychiatric: He has a normal mood and affect. Judgment normal.  Nursing note and vitals reviewed.   ED Course  Procedures (including critical care time) DIAGNOSTIC STUDIES: Oxygen Saturation is 100% on RA, normal by my interpretation.    COORDINATION OF CARE: 10:25 AM-Discussed treatment plan which includes labs with pt at bedside and pt agreed to plan.    Labs Review Labs Reviewed  CBC WITH DIFFERENTIAL/PLATELET - Abnormal; Notable for the following:    Platelets 139 (*)    All other components within normal limits  COMPREHENSIVE METABOLIC PANEL - Abnormal; Notable for the following:    Sodium 129 (*)    Chloride 93 (*)    Glucose, Bld 112 (*)    AST 396 (*)    ALT 184 (*)    Alkaline Phosphatase 31 (*)    All other components within normal limits  CK - Abnormal; Notable for the following:    Total CK 9207 (*)    All other components within normal limits    Imaging Review No results found. I have personally reviewed and evaluated these images and lab results as part of my medical decision-making.   EKG Interpretation None      MDM   Final diagnoses:  None    Patient with history of myositis presents with complaints of leg pain and his legs "giving out on him". His workup reveals an elevated CK of 9200, however normal renal function. He was hydrated with normal saline. Dr. [name] was consulted to admit the patient, however she is requesting an MRI of his lumbar spine to rule out spinal cord compression as a cause of his weakness. This was performed and was negative. He will now be admitted to the hospitalist  service for hydration and further observation.  I personally performed the services described in this documentation, which was scribed in my presence. The recorded information has been reviewed and is accurate.       Geoffery Lyons, MD 09/22/15 1245

## 2015-09-22 NOTE — ED Notes (Signed)
Pt complaining of nausea and requesting Ativan for nausea. Dr Judd Lienelo gave order

## 2015-09-22 NOTE — ED Notes (Signed)
Pt has hx of fibromyalgia states his legs gave out on him several days ago. Pt has had bilateral leg pain ever since. Pt is able to ambulate.

## 2015-09-22 NOTE — H&P (Signed)
[facility] History and Physical  [name] WUJ:[id number] DOB: 09-22-1965 DOA: 09/22/2015  Referring physician: ED physician, Dr. Judd Lien PCP: No PCP Per Patient   Chief Complaint: Bilateral lower extremity leg pain and weakness. ("My legs went out from under me").  HPI: [name] is a 50 y.o. male with a history of biopsy proven myositis in 2013, depression, alcohol abuse, tobacco abuse, hepatitis C, and cirrhosis. He presents to the emergency department with a chief complaint of bilateral lower extremity pain and weakness. He has chronic intermittent low back pain and pain in his legs, but on yesterday, his legs became weak and more painful which led to a couple of falls. He denies head trauma or blackout. He has occasional numbness in his legs, but he describes the pain as throbbing and sore. At its worse, it is 10 over 10 in intensity. He does not report radiation of low back pain to his legs in particular. He denies incontinence of his bladder or bowels. He denies upper extremity weakness. He denies headache. He denies a rash. He denies chest pain, shortness of breath, abdominal pain, nausea, vomiting, or diarrhea. He drank a couple shots of whiskey this morning to try to ease the pain, but in general, he drinks 2-3 sixpacks of beer daily. He denies any history of DTs or alcohol withdrawal syndrome.  In the ED, he was afebrile and hemodynamically stable. His lab data were significant for platelet count of 139, sodium of 129, and glucose of 112. MRI of his lumbar spine revealed mild disc and facet disease of L4-L5 with small left lateral disc protrusion and severe right foraminal encroachment at L5-S1, unchanged from the prior MRI. He is being admitted for further evaluation and management.     Review of Systems:  As above in history of present illness. Otherwise, review of systems is negative.  Past Medical History  Diagnosis Date  . Hepatitis C   . Myositis     Left deltoid  biopsy at [facility] 10/11/11.  Marland Kitchen Cirrhosis (HCC)   . Chronic pain syndrome   . 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 Alcohol abuse     Psychiatric admissions for alcohol and drug abuse  . History of cocaine abuse   . Depression     Multiple psychiatric admissions  . Tobacco abuse   . Tattoos   . Low TSH level January 2013    Normal free T4 of 1.06  . Back pain, chronic   . Acute renal failure (HCC)     Secondary to rhabdo. Treated with dialysis short-term.  . Chronic leg pain   . Rhabdomyolysis    Past Surgical History  Procedure Laterality Date  . Hernia repair    . Muscle biopsy    . Appendectomy     Social History: He is married. He has 3 children. He is disabled. He smokes one pack of cigarettes per day. He has a 40-pack-year history. He drinks 2-3 sixpacks of beer daily. He uses cocaine on occasion, but not recently. He denies any IV drug use.   Allergies  Allergen Reactions  . Acetaminophen Other (See Comments)    Liver disease     Family History  Problem Relation Age of Onset  . Hypertension Mother   . Cancer Father     throat cancer   . Diabetes Brother      Prior to Admission medications   Medication Sig Start Date End Date Taking? Authorizing Provider  aspirin 81 MG chewable  tablet Chew by mouth daily.   Yes Historical Provider, MD  diphenhydramine-acetaminophen (TYLENOL PM) 25-500 MG TABS tablet Take 2 tablets by mouth at bedtime as needed.   Yes Historical Provider, MD  ibuprofen (ADVIL,MOTRIN) 200 MG tablet Take 800 mg by mouth daily as needed for headache or moderate pain.    Yes Historical Provider, MD  LORazepam (ATIVAN) 1 MG tablet Take 1 tablet (1 mg total) by mouth every 4 (four) hours as needed for anxiety. Patient not taking: Reported on 09/22/2015 03/26/15   Raeford RazorStephen Kohut, MD   Physical Exam: Filed Vitals:   09/22/15 1330 09/22/15 1504 09/22/15 1520 09/22/15 1523  BP: 105/86 126/82 115/79 115/79  Pulse: 101 98 96 96  Temp:  98.5 F (36.9 C) 98.6 F (37  C) 98.6 F (37 C)  TempSrc:  Oral Oral Oral  Resp:  20 20   Height:   5\' 8"  (1.727 m) 5\' 8"  (1.727 m)  Weight:    65.7 kg (144 lb 13.5 oz)  SpO2: 94% 98% 97% 97%    Wt Readings from Last 3 Encounters:  09/22/15 65.7 kg (144 lb 13.5 oz)  03/26/15 63.504 kg (140 lb)  12/30/14 64.82 kg (142 lb 14.4 oz)    General:  Appears calm and comfortable; 50 year old Caucasian man slightly disheveled, in no acute distress. Eyes: PERRL, normal lids, irises & conjunctiva; conjunctivae are clear and sclerae white. ENT: grossly normal hearing, lips & tongue; mucous membranes are mildly dry. Neck: no LAD, masses or thyromegaly Cardiovascular: S1, S2, no murmurs rubs or gallops. No pedal edema. Telemetry: Not applicable.  Respiratory: CTA bilaterally, no w/r/r. Normal respiratory effort. Abdomen: soft, positive bowel sounds, nontender, nondistended. Skin: Few excoriated lesions on both knees with minimal erythema and no open drainage or bleeding; mildly tender. Otherwise, macular or papular rash. He does have multiple tattoos on both arms. Musculoskeletal: Both knees with excoriations and mild swelling and tenderness; range of motion within normal limits with flexion and extension; mild tenderness over the box to lower legs with no specific acute hot red joints. Psychiatric: grossly normal mood and affect, speech fluent and appropriate; no signs of tremulousness. Neurologic: grossly non-focal; radial nerves II through XII are intact.           Labs on Admission:  Basic Metabolic Panel:  Recent Labs Lab 09/22/15 1039  NA 129*  K 4.3  CL 93*  CO2 28  GLUCOSE 112*  BUN 9  CREATININE 0.72  CALCIUM 9.3   Liver Function Tests:  Recent Labs Lab 09/22/15 1039  AST 396*  ALT 184*  ALKPHOS 31*  BILITOT 0.9  PROT 7.6  ALBUMIN 4.4   No results for input(s): LIPASE, AMYLASE in the last 168 hours. No results for input(s): AMMONIA in the last 168 hours. CBC:  Recent Labs Lab  09/22/15 1039  WBC 7.1  NEUTROABS 4.0  HGB 14.0  HCT 39.2  MCV 89.5  PLT 139*   Cardiac Enzymes:  Recent Labs Lab 09/22/15 1039  CKTOTAL 9207*    BNP (last 3 results) No results for input(s): BNP in the last 8760 hours.  ProBNP (last 3 results) No results for input(s): PROBNP in the last 8760 hours.  CBG: No results for input(s): GLUCAP in the last 168 hours.  Radiological Exams on Admission: Mr Lumbar Spine Wo Contrast  09/22/2015  CLINICAL DATA:  Low back pain. Bilateral leg pain and weakness. Fall 1 week ago EXAM: MRI LUMBAR SPINE WITHOUT CONTRAST TECHNIQUE: Multiplanar, multisequence MR imaging  of the lumbar spine was performed. No intravenous contrast was administered. COMPARISON:  Lumbar MRI 10/13/2009 FINDINGS: Negative for fracture or mass. Normal alignment. Mild scoliosis. Conus medullaris normal and terminates at L1-2 L1-2:  Negative L2-3:  Negative L3-4: Mild disc and mild facet degeneration. Negative for spinal or foraminal stenosis L4-5: Mild disc bulging and mild facet degeneration. Small disc protrusion lateral to the foramen on the left without neural compression. No significant foraminal encroachment. Disc protrusion not present previously. Spinal canal normal in size L5-S1: Moderate disc degeneration with progressive disc space narrowing. Diffuse disc bulging and spurring also shows progression. Severe right foraminal encroachment with impingement of the right L5 nerve root is similar to the prior study. Mild left foraminal narrowing. IMPRESSION: Mild disc and facet degeneration L4-5. Small left lateral disc protrusion has developed since the prior study of 2011. Severe right foraminal encroachment L5-S1 unchanged from the prior MRI. Electronically Signed   By: Marlan Palau M.D.   On: 09/22/2015 12:34    EKG: Independently reviewed. Not applicable.  Assessment/Plan Principal Problem:   Myalgia and myositis Active Problems:   Bulging lumbar disc   Elevated  LFTs   Alcohol abuse   Chronic low back pain   TOBACCO ABUSE   Thrombocytopenia (HCC)   Chronic hepatitis C (HCC)   1. Bilateral lower extremity leg pain and weakness. The etiology is likely multifactorial including myositis versus rhabdo and radiculopathy. We'll treat his pain with as needed oxycodone and a prednisone taper. Will order physical therapy consultation and evaluation. 2. Myalgias with myositis versus rhabdomyolysis. Patient has a history of biopsy-proven myositis in 2013. He does not recall the specific etiology of the myositis. However, he reports being admitted on several occasions in the past for myositis or polymyositis. He had also been treated in the past with prednisone taper and oral opiates. On admission, his CK is 9207. This may be recurrent myositis versus rhabdo, in the setting of multiple falls. He denied any long-standing immobility. He will be started on a prednisone taper and as needed oxycodone for pain. Will order PT consult/evaluation. 3. Chronic low back pain with lumbar disc disease and probable radiculopathy. MRI of the patient's lumbar spine revealed severe right foraminal encroachment at L5-S1 which was unchanged from the previous MRI. We'll treat his pain as above and order PT evaluation. 4. Alcohol abuse. The patient was advised to stop drinking which may require assistance. He denies any previous history of DTs. He does not appear to be in active withdrawal. His last alcoholic drink was this morning. Nevertheless, will order Ativan when necessary via the CIWA protocol along with vitamin therapy. 5. Tobacco abuse. The patient was advised to stop smoking. He refused nicotine patch. Will order tobacco cessation counseling. 6. Elevated LFTs. The etiology is likely multifactorial including myositis, alcohol abuse, and chronic hepatitis C. We'll continue to monitor. 7. Thrombocytopenia. Patient's platelet count is 139. Etiology is likely secondary to chronic hepatitis  C and alcohol abuse. Will order TSH and vitamin B12 for further evaluation. 8. Hyponatremia. Patient's serum sodium is 129. The oligemia may be secondary to volume depletion or chronic beer intake. He also has a history of hyponatremia. Will treat with normal saline.     Code Status: Full code DVT Prophylaxis: SCDs  Family Communication: discussed with patient; family not available  Disposition Plan: anticipate discharge in the next 48 hours.   Time spent: One hour  [employer] [employer] [facility] Pager [phone]

## 2015-09-23 DIAGNOSIS — M5126 Other intervertebral disc displacement, lumbar region: Secondary | ICD-10-CM | POA: Diagnosis not present

## 2015-09-23 DIAGNOSIS — M6282 Rhabdomyolysis: Secondary | ICD-10-CM | POA: Diagnosis present

## 2015-09-23 DIAGNOSIS — G8929 Other chronic pain: Secondary | ICD-10-CM

## 2015-09-23 DIAGNOSIS — B182 Chronic viral hepatitis C: Secondary | ICD-10-CM | POA: Diagnosis present

## 2015-09-23 DIAGNOSIS — F17210 Nicotine dependence, cigarettes, uncomplicated: Secondary | ICD-10-CM | POA: Diagnosis present

## 2015-09-23 DIAGNOSIS — M545 Low back pain: Secondary | ICD-10-CM

## 2015-09-23 DIAGNOSIS — Z23 Encounter for immunization: Secondary | ICD-10-CM | POA: Diagnosis not present

## 2015-09-23 DIAGNOSIS — M5116 Intervertebral disc disorders with radiculopathy, lumbar region: Secondary | ICD-10-CM | POA: Diagnosis present

## 2015-09-23 DIAGNOSIS — F1010 Alcohol abuse, uncomplicated: Secondary | ICD-10-CM | POA: Diagnosis present

## 2015-09-23 DIAGNOSIS — M609 Myositis, unspecified: Secondary | ICD-10-CM | POA: Diagnosis present

## 2015-09-23 DIAGNOSIS — F1490 Cocaine use, unspecified, uncomplicated: Secondary | ICD-10-CM | POA: Diagnosis present

## 2015-09-23 DIAGNOSIS — M791 Myalgia: Secondary | ICD-10-CM | POA: Diagnosis not present

## 2015-09-23 DIAGNOSIS — E871 Hypo-osmolality and hyponatremia: Secondary | ICD-10-CM | POA: Diagnosis present

## 2015-09-23 DIAGNOSIS — K7460 Unspecified cirrhosis of liver: Secondary | ICD-10-CM | POA: Diagnosis present

## 2015-09-23 DIAGNOSIS — F329 Major depressive disorder, single episode, unspecified: Secondary | ICD-10-CM | POA: Diagnosis present

## 2015-09-23 DIAGNOSIS — G894 Chronic pain syndrome: Secondary | ICD-10-CM | POA: Diagnosis present

## 2015-09-23 DIAGNOSIS — Z886 Allergy status to analgesic agent status: Secondary | ICD-10-CM | POA: Diagnosis not present

## 2015-09-23 LAB — RAPID URINE DRUG SCREEN, HOSP PERFORMED
AMPHETAMINES: NOT DETECTED
BARBITURATES: NOT DETECTED
BENZODIAZEPINES: POSITIVE — AB
COCAINE: POSITIVE — AB
Opiates: POSITIVE — AB
Tetrahydrocannabinol: NOT DETECTED

## 2015-09-23 LAB — COMPREHENSIVE METABOLIC PANEL
ALK PHOS: 30 U/L — AB (ref 38–126)
ALT: 129 U/L — AB (ref 17–63)
ANION GAP: 5 (ref 5–15)
AST: 186 U/L — ABNORMAL HIGH (ref 15–41)
Albumin: 3.6 g/dL (ref 3.5–5.0)
BUN: 12 mg/dL (ref 6–20)
CALCIUM: 8.6 mg/dL — AB (ref 8.9–10.3)
CO2: 26 mmol/L (ref 22–32)
CREATININE: 0.66 mg/dL (ref 0.61–1.24)
Chloride: 100 mmol/L — ABNORMAL LOW (ref 101–111)
Glucose, Bld: 148 mg/dL — ABNORMAL HIGH (ref 65–99)
Potassium: 4.4 mmol/L (ref 3.5–5.1)
Sodium: 131 mmol/L — ABNORMAL LOW (ref 135–145)
Total Bilirubin: 0.5 mg/dL (ref 0.3–1.2)
Total Protein: 6.8 g/dL (ref 6.5–8.1)

## 2015-09-23 LAB — CBC
HCT: 37.7 % — ABNORMAL LOW (ref 39.0–52.0)
Hemoglobin: 12.8 g/dL — ABNORMAL LOW (ref 13.0–17.0)
MCH: 31.3 pg (ref 26.0–34.0)
MCHC: 34 g/dL (ref 30.0–36.0)
MCV: 92.2 fL (ref 78.0–100.0)
PLATELETS: 153 10*3/uL (ref 150–400)
RBC: 4.09 MIL/uL — AB (ref 4.22–5.81)
RDW: 12.3 % (ref 11.5–15.5)
WBC: 6.7 10*3/uL (ref 4.0–10.5)

## 2015-09-23 LAB — CK: CK TOTAL: 3292 U/L — AB (ref 49–397)

## 2015-09-23 MED ORDER — PREDNISONE 10 MG PO TABS: ORAL_TABLET | ORAL | Status: DC

## 2015-09-23 MED ORDER — OXYCODONE HCL 10 MG PO TABS: 10.0000 mg | ORAL_TABLET | Freq: Four times a day (QID) | ORAL | Status: DC | PRN

## 2015-09-23 MED ORDER — CYANOCOBALAMIN 1000 MCG/ML IJ SOLN
1000.0000 ug | Freq: Once | INTRAMUSCULAR | Status: AC
  Administered 2015-09-23: 1000 ug via INTRAMUSCULAR
  Filled 2015-09-23: qty 1

## 2015-09-23 MED ORDER — THIAMINE HCL 100 MG PO TABS: 100.0000 mg | ORAL_TABLET | Freq: Every day | ORAL | Status: DC

## 2015-09-23 MED ORDER — ADULT MULTIVITAMIN W/MINERALS CH: 1.0000 | ORAL_TABLET | Freq: Every day | ORAL | Status: DC

## 2015-09-23 NOTE — Care Management Note (Signed)
Case Management Note  Patient Details  Name: [name] MRN: [id number] Date of Birth: July 07, 1966  Subjective/Objective:                  Pt admitted from home with myalgia and myositis. Pt lives with his mother and will return home at discharge. Pt is independent with ADL's. Pt has a w/c for home use.  Action/Plan: Pt has no PCP. [facility] and appt documented on AVS. Awaiting PT eval.  Expected Discharge Date:  09/25/15               Expected Discharge Plan:  Home/Self Care  In-House Referral:  NA  Discharge planning Services  CM Consult, Follow-up appt scheduled  Post Acute Care Choice:  NA Choice offered to:  NA  DME Arranged:    DME Agency:     HH Arranged:    HH Agency:     Status of Service:  Completed, signed off  Medicare Important Message Given:    Date Medicare IM Given:    Medicare IM give by:    Date Additional Medicare IM Given:    Additional Medicare Important Message give by:     If discussed at Long Length of Stay Meetings, dates discussed:    Additional Comments:  Cheryl FlashBlackwell, Caedin Mogan Crowder, RN 09/23/2015, 2:26 PM

## 2015-09-23 NOTE — Clinical Social Work Note (Signed)
Clinical Social Work Assessment  Patient Details  Name: [name] MRN: [id number] Date of Birth: 1966-08-22  Date of referral:  09/23/15               Reason for consult:  Substance Use/ETOH Abuse                Permission sought to share information with:    Permission granted to share information::     Name::        Agency::     Relationship::     Contact Information:     Housing/Transportation Living arrangements for the past 2 months:  Mobile Home Source of Information:  Patient Patient Interpreter Needed:  None Criminal Activity/Legal Involvement Pertinent to Current Situation/Hospitalization:  No - Comment as needed Significant Relationships:  Parents, Spouse Lives with:  Parents Do you feel safe going back to the place where you live?  Yes Need for family participation in patient care:  No (Coment)  Care giving concerns:  Pt is independent.    Social Worker assessment / plan:  CSW met with pt at bedside following consult for substance abuse. Pt alert and oriented and open to talking to CSW. He states that he lives with his mother, but his wife is his best support. Pt's wife lives with her parents. Pt is on disability for a muscle disease.  Pt admits to drinking about 12 beers a day and has been drinking about this amount for the past 40 years. He occasionally has a shot or two of liquor. Pt states that this is just a habit and something he does now. He indicates, "The only problem is that all my money goes to alcohol." Pt has been to inpatient treatment and AA in the past. He is considering going to inpatient again and will plan to contact [facility] if so. He said that he had to wait 90 days before he could return. Pt does not have a license, so transportation is a problem to treatment as he has had 9 DWIs in the past. SBIRT completed and pt scored 18, indicating high risk. He was provided substance abuse resources, but states he is already aware of them. Pt also admits to about  weekly cocaine use as his neighbor encourages this. He denies this being a problem.   Pt requested information on ALF as his sister mentioned this to him. He is aware that he cannot use drugs/alcohol in facility. CSW explained financial side of placement and he immediately responded that he was not interested. He does have questions about why his Medicaid was stopped. CSW notified financial counselor for follow up. Will sign off as pt is aware of substance abuse resources and will follow up if interested.   Employment status:  Disabled (Comment on whether or not currently receiving Disability) Insurance information:  Medicare PT Recommendations:  Not assessed at this time Information / Referral to community resources:  Outpatient Substance Abuse Treatment Options  Patient/Family's Response to care:  Pt may consider inpatient treatment, but plans to follow up on his own.   Patient/Family's Understanding of and Emotional Response to Diagnosis, Current Treatment, and Prognosis:  Pt does not appear to understand significance of drinking on health.   Emotional Assessment Appearance:  Appears stated age Attitude/Demeanor/Rapport:  Other (Cooperative) Affect (typically observed):  Appropriate Orientation:  Oriented to Self, Oriented to Place, Oriented to  Time, Oriented to Situation Alcohol / Substance use:  Alcohol Use, Illicit Drugs Psych involvement (Current and /or  in the community):     Discharge Needs  Concerns to be addressed:  Substance Abuse Concerns Readmission within the last 30 days:  No Current discharge risk:  Substance Abuse Barriers to Discharge:  Continued Medical Work up   Salome Arnt, [place] 09/23/2015, 10:35 AM [phone]

## 2015-09-23 NOTE — Evaluation (Signed)
Physical Therapy Evaluation Patient Details Name: [name] MRN: [id number] DOB: [birth year] Today's Date: 09/23/2015   History of Present Illness  Pt is a 50yo white male with PMH: myositits, depression, ETOH abuse, tobacco use, hep-C, and cirrohosis. Pt sustained a fall about 1 week ago onto knees after drinking, wherein he reports continued lateral pain along the peroneal longus distribution to the toes bilat R>L. Lumbar Spine MRI reveals moderate changes at the L4/5 level to the disc and facets, as well as L5/S1 severe formainal narrowing on R, and mild foraminal narrowing on L. Upon visualization of MRI lumbar spine is generally lacking in lordosis.   Clinical Impression  Pt demonstrating strength and sensation WNL in BLE, with no noted dermotomal or myotomal deficits. Pain pattern described by patient does correlate with imaging results, but is not mechanical in nature, as ruled out by testing of the lumbar spine, questioning pain mediated by sub-acute inflammation/swelling at the level of the spine. Inspection of the knees does not reveal any bony abnormality or effusion consistent with subacute orthopedic insult, and described pain pattern does not correlate well to described MOI, at least at the level of the knee. Soft tissue assessment fails to identify any abnormality or allodynic points.  Pt presenting with difficulty walking, poor coordination and altered motor planning during gait, severe tightness in posterior lower kinetic chain bilat. Pt will benefit from skilled PT intervention to address the above deficits and to imrpove functional activity tolerance and quality of life. Recommending DC to home once appropriate and follow-up with outpatient PT.     Follow Up Recommendations Outpatient PT    Equipment Recommendations  None recommended by PT    Recommendations for Other Services       Precautions / Restrictions Precautions Precautions: None Restrictions Weight Bearing  Restrictions: No      Mobility  Bed Mobility Overal bed mobility: Independent                Transfers Overall transfer level: Independent                  Ambulation/Gait Ambulation/Gait assistance: Independent (ambulating ad lib upon arrival. )              Stairs            Wheelchair Mobility    Modified Rankin (Stroke Patients Only)       Balance Overall balance assessment: No apparent balance deficits (not formally assessed)                                           Pertinent Vitals/Pain Pain Assessment: 0-10 Pain Score: 8  Pain Location: R lateral calf  Pain Descriptors / Indicators: Sharp Pain Intervention(s): Monitored during session;Premedicated before session    Home Living Family/patient expects to be discharged to:: Private residence Living Arrangements: Parent Available Help at Discharge: Family Type of Home: Mobile home Home Access: Stairs to enter Entrance Stairs-Rails: None Entrance Stairs-Number of Steps: 4   Home Equipment: Wheelchair - manual      Prior Function           Comments: limited tolerance to community distances, chronic pain patient, indep in all ADL.      Hand Dominance        Extremity/Trunk Assessment   Upper Extremity Assessment: Overall WFL for tasks assessed  Lower Extremity Assessment: Overall WFL for tasks assessed (No myotomal or dermotomal weakness noted. severe hamstrings tightness. )      Cervical / Trunk Assessment:  (rigid truck, with scoliosis noted. )  Communication   Communication: No difficulties  Cognition Arousal/Alertness: Awake/alert Behavior During Therapy: WFL for tasks assessed/performed (hyperactive and jittery.) Overall Cognitive Status: Within Functional Limits for tasks assessed                      General Comments      Exercises Other Exercises Other Exercises: DKTC in supine 25x; issue for home HEP. end rang  eknee flexion is painfull bilat.  Other Exercises: Seated hamstrings stretch bilat 1x30sec taught for HEP. Other Exercises: Reapeated lumbar flexion in sitting x12 Other Exercises: repeated lumbar extension in standing x 12      Assessment/Plan    PT Assessment All further PT needs can be met in the next venue of care;Patient needs continued PT services  PT Diagnosis Difficulty walking;Other (comment) (Chronic pain; generalized hypomobility. )   PT Problem List Decreased range of motion;Decreased coordination;Decreased knowledge of precautions;Pain (muscle tightness. )  PT Treatment Interventions     PT Goals (Current goals can be found in the Care Plan section) Acute Rehab PT Goals PT Goal Formulation: All assessment and education complete, DC therapy    Frequency     Barriers to discharge Decreased caregiver support      Co-evaluation               End of Session   Activity Tolerance: Patient tolerated treatment well;No increased pain Patient left: in bed Nurse Communication: Other (comment)         Time: 4497-5300 PT Time Calculation (min) (ACUTE ONLY): 21 min   Charges:   PT Evaluation $PT Eval Moderate Complexity: 1 Procedure PT Treatments $Therapeutic Exercise: 8-22 mins   PT G Codes:        Kyrah Schiro C 29-Sep-2015, 3:39 PM  3:45 PM  Etta Grandchild, PT, DPT [place] License # [id number]

## 2015-09-23 NOTE — Discharge Summary (Signed)
Physician Discharge Summary  [name] NGE:[id number] DOB: 1966/08/18 DOA: 09/22/2015  PCP: No PCP Per Patient  Admit date: 09/22/2015 Discharge date: 09/23/2015  Time spent: Greater than 30 minutes  Recommendations for Outpatient Follow-up:  1. Recommend referral to a pain clinic by his new PCP.  2. Outpatient referral for physical therapy was ordered. Consider outpatient referral to neurosurgery. 3. HIV serology was ordered but was pending at the time of discharge.   Discharge Diagnoses:  1. Acute on chronic myalgias, secondary to recurrent myositis versus nontraumatic rhabdomyolysis; clinically undetermined. 2. Chronic low back pain with evidence of bulging lumbar disc and possible radiculopathy. 3. Chronic alcohol abuse. 4. Tobacco abuse. 5. Chronic hepatitis C with cirrhosis. 6. Elevated LFTs secondary to myositis and alcohol abuse. 7. Thrombocytopenia, likely secondary to hepatitis C and alcohol abuse. 8. Hyponatremia, likely from volume depletion or chronic beer intake.  Discharge Condition: Improved.  Diet recommendation: Regular as tolerated.  Filed Weights   09/22/15 1014 09/22/15 1523  Weight: 65.772 kg (145 lb) 65.7 kg (144 lb 13.5 oz)    History of present illness:  The patient is a 50 year old man with a history of biopsy-proven myositis in 2013, depression, alcohol abuse, hepatitis C, and cirrhosis. He presented to the emergency department on 09/21/2014 with a chief complaint of bilateral lower extremity leg pain and weakness. He stated that "my legs went out from under me". In the ED, he was afebrile and hemodynamically stable. His lab data were significant for a total CK of 9207, platelet count of 139, sodium of 129, and glucose of 112. MRI of his lumbar spine revealed mild disc and facet disease of L4-L5 with small left lateral disc protrusion and severe right foraminal encroachment at L5-S1, unchanged from the prior MRI. He was admitted for further evaluation and  management.  Hospital Course:  The patient was started on IV fluids with normal saline for hydration. Alcohol withdrawal protocol/CIWA was ordered with as needed Ativan and vitamin therapy. He was strongly advised to stop drinking. A nicotine patch was offered, but he refused it. He was also encouraged to try to stop smoking. His pain was treated with as needed oxycodone. A prednisone taper was started for possible radiculopathy and the bulging lumbar disk seen on MRI. The patient had minimal if no numbness or tingling in his extremities. He denied incontinence of his bladder or bowels. For further evaluation, a number of studies were ordered. His vitamin B12 was borderline low at 206. He was given one IM injection of vitamin B12. His follow-up platelet count normalized. His follow-up CK improved to 3292. Serum sodium improved to 131. His TSH was within normal limits. His urine drug screen-ordered following admission- was positive for cocaine, opiates, and benzodiazepines (he received oxycodone and Ativan during hospitalization.) The patient was informed of the findings, but he denied using cocaine recently. HIV antibody was ordered, but was pending at the time of discharge.  Following supportive treatment, his pain subsided and he regained most of the strength in his legs back to baseline. Physical therapist was consulted for evaluation and recommendations. The physical therapist recommended outpatient physical therapy. The patient did not require inpatient therapy or equipment for ambulation.  The etiology of his leg pain and difficulty ambulating was likely from a combination of myositis or nontraumatic rhabdomyolysis-this could not be clinically determined and acute on chronic back pain with evidence of a bulging disc and nerve root encroachment. Given the patient's chronic pain, he may need referral to a  pain clinic and a possible referral to a neurosurgeon. His alcohol abuse and cocaine use may be  problematic. However, an appointment was made for him to follow-up at the [facility] clinic. Hopefully, referrals can be made for long-term management.  Procedures:  None  Consultations:  None  Discharge Exam: Filed Vitals:   09/23/15 0025 09/23/15 0642  BP: 121/81 108/67  Pulse: 94 97  Temp: 98 F (36.7 C) 98.2 F (36.8 C)  Resp: 18 20    General: 50 year old Caucasian man who looks better. Cardiovascular: S1, S2, no murmurs rubs or gallops. Respiratory: Clear to auscultation bilaterally. Neurologic: The patient is alert and oriented 3. No signs of tremulousness. He is able to raise each leg against gravity 45. Strength of his lower extremities is 5 minus over 5 bilaterally. Sensation is grossly intact.  Discharge Instructions   Discharge Instructions    Diet general    Complete by:  As directed      Discharge instructions    Complete by:  As directed   Stop drinking. Specifically do not take oxycodone and drink alcohol together. Try to stop smoking. Drink Gatorade and/or water for hydration-at least 6-8 glasses daily.     Increase activity slowly    Complete by:  As directed           Current Discharge Medication List    START taking these medications   Details  Multiple Vitamin (MULTIVITAMIN WITH MINERALS) TABS tablet Take 1 tablet by mouth daily.    oxyCODONE 10 MG TABS Take 1 tablet (10 mg total) by mouth every 6 (six) hours as needed for moderate pain. Qty: 30 tablet, Refills: 0    predniSONE (DELTASONE) 10 MG tablet Starting on 09/24/15: Takes 6 tablets daily for 1 day; then take 5 tablets the next day for 1 day; then take 4 tablets the next day for 1 day; then take 3 tablets the next day for 1 day; then take 2 tablets the next day for 1 day; then take one tablet the next day for 1 day; then stop. Qty: 21 tablet, Refills: 0    thiamine 100 MG tablet Take 1 tablet (100 mg total) by mouth daily.      CONTINUE these medications which have NOT CHANGED    Details  diphenhydramine-acetaminophen (TYLENOL PM) 25-500 MG TABS tablet Take 2 tablets by mouth at bedtime as needed.      STOP taking these medications     aspirin 81 MG chewable tablet      ibuprofen (ADVIL,MOTRIN) 200 MG tablet      LORazepam (ATIVAN) 1 MG tablet        Allergies  Allergen Reactions  . Acetaminophen Other (See Comments)    Liver disease    Follow-up Information    Follow up with Rondel Baton On 10/01/2015.   Why:  at 10:00   Contact information:   [address] [place] [address] [phone]        The results of significant diagnostics from this hospitalization (including imaging, microbiology, ancillary and laboratory) are listed below for reference.    Significant Diagnostic Studies: Mr Lumbar Spine Wo Contrast  09/22/2015  CLINICAL DATA:  Low back pain. Bilateral leg pain and weakness. Fall 1 week ago EXAM: MRI LUMBAR SPINE WITHOUT CONTRAST TECHNIQUE: Multiplanar, multisequence MR imaging of the lumbar spine was performed. No intravenous contrast was administered. COMPARISON:  Lumbar MRI 10/13/2009 FINDINGS: Negative for fracture or mass. Normal alignment. Mild scoliosis. Conus  medullaris normal and terminates at L1-2 L1-2:  Negative L2-3:  Negative L3-4: Mild disc and mild facet degeneration. Negative for spinal or foraminal stenosis L4-5: Mild disc bulging and mild facet degeneration. Small disc protrusion lateral to the foramen on the left without neural compression. No significant foraminal encroachment. Disc protrusion not present previously. Spinal canal normal in size L5-S1: Moderate disc degeneration with progressive disc space narrowing. Diffuse disc bulging and spurring also shows progression. Severe right foraminal encroachment with impingement of the right L5 nerve root is similar to the prior study. Mild left foraminal narrowing. IMPRESSION: Mild disc and facet degeneration L4-5. Small left lateral disc protrusion has developed  since the prior study of 2011. Severe right foraminal encroachment L5-S1 unchanged from the prior MRI. Electronically Signed   By: Marlan Palauharles  Clark M.D.   On: 09/22/2015 12:34    Microbiology: No results found for this or any previous visit (from the past 240 hour(s)).   Labs: Basic Metabolic Panel:  Recent Labs Lab 09/22/15 1039 09/23/15 0546  NA 129* 131*  K 4.3 4.4  CL 93* 100*  CO2 28 26  GLUCOSE 112* 148*  BUN 9 12  CREATININE 0.72 0.66  CALCIUM 9.3 8.6*   Liver Function Tests:  Recent Labs Lab 09/22/15 1039 09/23/15 0546  AST 396* 186*  ALT 184* 129*  ALKPHOS 31* 30*  BILITOT 0.9 0.5  PROT 7.6 6.8  ALBUMIN 4.4 3.6   No results for input(s): LIPASE, AMYLASE in the last 168 hours. No results for input(s): AMMONIA in the last 168 hours. CBC:  Recent Labs Lab 09/22/15 1039 09/23/15 0546  WBC 7.1 6.7  NEUTROABS 4.0  --   HGB 14.0 12.8*  HCT 39.2 37.7*  MCV 89.5 92.2  PLT 139* 153   Cardiac Enzymes:  Recent Labs Lab 09/22/15 1039 09/23/15 0546  CKTOTAL 9207* 3292*   BNP: BNP (last 3 results) No results for input(s): BNP in the last 8760 hours.  ProBNP (last 3 results) No results for input(s): PROBNP in the last 8760 hours.  CBG: No results for input(s): GLUCAP in the last 168 hours.     Signed:  Elliot CousinFISHER,Alenah Sarria MD  FACP  [facility] 09/23/2015, 4:29 PM

## 2015-09-23 NOTE — Care Management Note (Signed)
Case Management Note  Patient Details  Name: Erich MontaneJames W Diem MRN: [id number] Date of Birth: 11-11-1965  Subjective/Objective:                    Action/Plan:   Expected Discharge Date:  09/25/15               Expected Discharge Plan:  Home/Self Care  In-House Referral:  NA  Discharge planning Services  CM Consult, Follow-up appt scheduled  Post Acute Care Choice:  NA Choice offered to:  NA  DME Arranged:    DME Agency:     HH Arranged:    HH Agency:     Status of Service:  Completed, signed off  Medicare Important Message Given:    Date Medicare IM Given:    Medicare IM give by:    Date Additional Medicare IM Given:    Additional Medicare Important Message give by:     If discussed at Long Length of Stay Meetings, dates discussed:    Additional Comments: PT recommends outpt therapy. Pt agreeable and referral formed faxed to [facility] outpt therapy dept. Cm explained the process to pt and the out pt dept will call pt with appt time. Anticipate discharge home today. No further Cm needs noted. Arlyss QueenBlackwell, Leodan Bolyard Ehrhardtrowder, RN 09/23/2015, 3:34 PM

## 2015-09-23 NOTE — Progress Notes (Signed)
Pt is very anxious to leave, pacing the hall and looking for the elevator.  Returned pt to his room, discussed the importance of keeping all follow-up appts, keeping prescription meds for his own use, not taking other meds without discussing w/PCP.  His brother is to pick him up and take him home where he lives w/his wife.  Left a message for pt's wife, [name] [phone]), to call our unit to discuss pt's discharge instructions.  IV removed without complication, catheter intact, bleeding controlled.  Pt is discharged with written instructions and two prescriptions.

## 2015-09-24 LAB — HIV ANTIBODY (ROUTINE TESTING W REFLEX): HIV SCREEN 4TH GENERATION: NONREACTIVE

## 2015-10-14 ENCOUNTER — Telehealth (HOSPITAL_COMMUNITY): Payer: Self-pay

## 2015-10-14 NOTE — Telephone Encounter (Signed)
1/26 called to schedule eval and he said that right now he has no transportation.  We talked about [facility] but he said he had never used them, he had their number.  He is going to talk to his wife and will call back.

## 2016-03-05 ENCOUNTER — Encounter (HOSPITAL_COMMUNITY): Payer: Self-pay | Admitting: Emergency Medicine

## 2016-03-05 ENCOUNTER — Emergency Department (HOSPITAL_COMMUNITY)
Admission: EM | Admit: 2016-03-05 | Discharge: 2016-03-05 | Disposition: A | Discharge status: Home / Self Care | Payer: Medicare Other | Attending: Emergency Medicine | Admitting: Emergency Medicine

## 2016-03-05 DIAGNOSIS — F1410 Cocaine abuse, uncomplicated: Secondary | ICD-10-CM | POA: Diagnosis not present

## 2016-03-05 DIAGNOSIS — F1910 Other psychoactive substance abuse, uncomplicated: Secondary | ICD-10-CM | POA: Insufficient documentation

## 2016-03-05 DIAGNOSIS — F329 Major depressive disorder, single episode, unspecified: Secondary | ICD-10-CM | POA: Diagnosis not present

## 2016-03-05 DIAGNOSIS — F17210 Nicotine dependence, cigarettes, uncomplicated: Secondary | ICD-10-CM | POA: Diagnosis not present

## 2016-03-05 DIAGNOSIS — M791 Myalgia, unspecified site: Secondary | ICD-10-CM

## 2016-03-05 DIAGNOSIS — M545 Low back pain: Secondary | ICD-10-CM | POA: Diagnosis present

## 2016-03-05 DIAGNOSIS — M5416 Radiculopathy, lumbar region: Secondary | ICD-10-CM

## 2016-03-05 DIAGNOSIS — M5136 Other intervertebral disc degeneration, lumbar region: Secondary | ICD-10-CM | POA: Diagnosis not present

## 2016-03-05 LAB — COMPREHENSIVE METABOLIC PANEL
ALBUMIN: 3.9 g/dL (ref 3.5–5.0)
ALT: 39 U/L (ref 17–63)
AST: 36 U/L (ref 15–41)
Alkaline Phosphatase: 37 U/L — ABNORMAL LOW (ref 38–126)
Anion gap: 9 (ref 5–15)
BUN: 10 mg/dL (ref 6–20)
CHLORIDE: 100 mmol/L — AB (ref 101–111)
CO2: 25 mmol/L (ref 22–32)
Calcium: 9 mg/dL (ref 8.9–10.3)
Creatinine, Ser: 0.74 mg/dL (ref 0.61–1.24)
GFR calc Af Amer: >60 mL/min (ref >60)
GFR calc non Af Amer: >60 mL/min (ref >60)
GLUCOSE: 104 mg/dL — AB (ref 65–99)
POTASSIUM: 4.2 mmol/L (ref 3.5–5.1)
SODIUM: 134 mmol/L — AB (ref 135–145)
Total Bilirubin: 0.4 mg/dL (ref 0.3–1.2)
Total Protein: 7 g/dL (ref 6.5–8.1)

## 2016-03-05 LAB — CBC WITH DIFFERENTIAL/PLATELET
BASOS ABS: 0.1 10*3/uL (ref 0.0–0.1)
BASOS PCT: 1 %
EOS PCT: 6 %
Eosinophils Absolute: 0.5 10*3/uL (ref 0.0–0.7)
HCT: 33.2 % — ABNORMAL LOW (ref 39.0–52.0)
Hemoglobin: 11.9 g/dL — ABNORMAL LOW (ref 13.0–17.0)
Lymphocytes Relative: 34 %
Lymphs Abs: 2.8 10*3/uL (ref 0.7–4.0)
MCH: 31.1 pg (ref 26.0–34.0)
MCHC: 35.8 g/dL (ref 30.0–36.0)
MCV: 86.7 fL (ref 78.0–100.0)
MONO ABS: 0.8 10*3/uL (ref 0.1–1.0)
Monocytes Relative: 10 %
Neutro Abs: 4.2 10*3/uL (ref 1.7–7.7)
Neutrophils Relative %: 49 %
PLATELETS: 200 10*3/uL (ref 150–400)
RBC: 3.83 MIL/uL — ABNORMAL LOW (ref 4.22–5.81)
RDW: 13 % (ref 11.5–15.5)
WBC: 8.4 10*3/uL (ref 4.0–10.5)

## 2016-03-05 LAB — URINALYSIS, ROUTINE W REFLEX MICROSCOPIC
Bilirubin Urine: NEGATIVE
GLUCOSE, UA: NEGATIVE mg/dL
Hgb urine dipstick: NEGATIVE
Ketones, ur: NEGATIVE mg/dL
LEUKOCYTES UA: NEGATIVE
Nitrite: NEGATIVE
PH: 6 (ref 5.0–8.0)
PROTEIN: NEGATIVE mg/dL
Specific Gravity, Urine: 1.01 (ref 1.005–1.030)

## 2016-03-05 LAB — RAPID URINE DRUG SCREEN, HOSP PERFORMED
Amphetamines: NOT DETECTED
BARBITURATES: NOT DETECTED
Benzodiazepines: POSITIVE — AB
Cocaine: POSITIVE — AB
Opiates: POSITIVE — AB
Tetrahydrocannabinol: NOT DETECTED

## 2016-03-05 LAB — CK: CK TOTAL: 494 U/L — AB (ref 49–397)

## 2016-03-05 LAB — ETHANOL: ALCOHOL ETHYL (B): 26 mg/dL — AB (ref <5)

## 2016-03-05 MED ORDER — PREDNISONE 20 MG PO TABS: 20.0000 mg | ORAL_TABLET | Freq: Two times a day (BID) | ORAL | Status: DC

## 2016-03-05 NOTE — ED Provider Notes (Signed)
CSN: [id number]     Arrival date & time 03/05/16  1528 History   First MD Initiated Contact with Patient 03/05/16 1540     Chief Complaint  Patient presents with  . Illness     (Consider location/radiation/quality/duration/timing/severity/associated sxs/prior Treatment) HPI   [name] is a 50 y.o. male who presents for evaluation of low back pain and leg pain. He states that his lower back and leg pain moves from the right to the left, it has been present for several months. It was aggravated when he was recently put in jail because of a parole violation. He is concerned that he has "myositis" and will need to be hospitalized. He states that he has a history of myositis. It was evaluated by biopsy about 5 years ago. He does not know specific treatment that was given for it. He has been hospitalized in the past with rhabdomyolysis, and renal insufficiency. He knows that he has "lumbar disks". He denies fever, chills, nausea, vomiting, cough, shortness of breath or chest pain. There are no other known modifying factors.    Past Medical History  Diagnosis Date  . Hepatitis C   . Myositis     Left deltoid biopsy at [facility] 10/11/11.  Marland Kitchen Cirrhosis (HCC)   . Chronic pain syndrome   . 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 Alcohol abuse     Psychiatric admissions for alcohol and drug abuse  . History of cocaine abuse   . Depression     Multiple psychiatric admissions  . Tobacco abuse   . Tattoos   . Low TSH level January 2013    Normal free T4 of 1.06  . Back pain, chronic   . Acute renal failure (HCC)     Secondary to rhabdo. Treated with dialysis short-term.  . Chronic leg pain   . Rhabdomyolysis    Past Surgical History  Procedure Laterality Date  . Hernia repair    . Muscle biopsy    . Appendectomy     Family History  Problem Relation Age of Onset  . Hypertension Mother   . Cancer Father     throat cancer   . Diabetes Brother    Social History  Substance Use Topics  .  Smoking status: Current Every Day Smoker -- 1.00 packs/day for 40 years    Types: Cigarettes  . Smokeless tobacco: Current User  . Alcohol Use: 60.0 oz/week    100 Cans of beer, 0 Standard drinks or equivalent per week     Comment: daily    Review of Systems  All other systems reviewed and are negative.     Allergies  Acetaminophen  Home Medications   Prior to Admission medications   Medication Sig Start Date End Date Taking? Authorizing Provider  predniSONE (DELTASONE) 20 MG tablet Take 1 tablet (20 mg total) by mouth 2 (two) times daily. 03/05/16   Mancel Bale, MD   BP 133/85 mmHg  Pulse 111  Temp(Src) 98.1 F (36.7 C) (Oral)  Resp 18  Ht  (1.727 m)  Wt 155 lb (70.308 kg)  BMI 23.57 kg/m2  SpO2 100% Physical Exam  Constitutional: He is oriented to person, place, and time. He appears well-developed and well-nourished. No distress.  HENT:  Head: Normocephalic and atraumatic.  Right Ear: External ear normal.  Left Ear: External ear normal.  Eyes: Conjunctivae and EOM are normal. Pupils are equal, round, and reactive to light.  Neck: Normal range of motion and phonation normal.  Neck supple.  Cardiovascular: Normal rate, regular rhythm and normal heart sounds.   Pulmonary/Chest: Effort normal and breath sounds normal. He exhibits no bony tenderness.  Abdominal: Soft. There is no tenderness.  Musculoskeletal: Normal range of motion.  Normal range of motion. Lumbar spine. Negative straight leg raising, bilaterally. No tenderness or crepitation of the thigh or calves bilaterally.  Neurological: He is alert and oriented to person, place, and time. No cranial nerve deficit or sensory deficit. He exhibits normal muscle tone. Coordination normal.  Skin: Skin is warm, dry and intact.  Psychiatric: He has a normal mood and affect. His behavior is normal. Judgment and thought content normal.  Nursing note and vitals reviewed.   ED Course  Procedures (including critical  care time)  Medications - No data to display  Patient Vitals for the past 24 hrs:  BP Temp Temp src Pulse Resp SpO2 Height Weight  03/05/16 1533 133/85 mmHg 98.1 F (36.7 C) Oral 111 18 100 % 5\' 8"  (1.727 m) 155 lb (70.308 kg)    6:30 PM Reevaluation with update and discussion. After initial assessment and treatment, an updated evaluation reveals no change in clinical status. Findings discussed with patient, he requested Vicoprofen, which I declined to give him. All questions were answered. Sylis Ketchum L     Labs Review Labs Reviewed  CBC WITH DIFFERENTIAL/PLATELET - Abnormal; Notable for the following:    RBC 3.83 (*)    Hemoglobin 11.9 (*)    HCT 33.2 (*)    All other components within normal limits  COMPREHENSIVE METABOLIC PANEL - Abnormal; Notable for the following:    Sodium 134 (*)    Chloride 100 (*)    Glucose, Bld 104 (*)    Alkaline Phosphatase 37 (*)    All other components within normal limits  CK - Abnormal; Notable for the following:    Total CK 494 (*)    All other components within normal limits  URINE RAPID DRUG SCREEN, HOSP PERFORMED - Abnormal; Notable for the following:    Opiates POSITIVE (*)    Cocaine POSITIVE (*)    Benzodiazepines POSITIVE (*)    All other components within normal limits  ETHANOL - Abnormal; Notable for the following:    Alcohol, Ethyl (B) 26 (*)    All other components within normal limits  URINALYSIS, ROUTINE W REFLEX MICROSCOPIC (NOT AT [facility])    Imaging Review No results found. I have personally reviewed and evaluated these images and lab results as part of my medical decision-making.   EKG Interpretation None      MDM   Final diagnoses:  Myalgia  Degenerative disc disease, lumbar  Lumbar radiculopathy  Cocaine abuse  Polysubstance abuse    Nonspecific myalgia, and radicular pain. No evidence for significant rhabdomyolysis, or other complication from his history of "myositis". Polysubstance abuse as a  complicating factor. He does have known L5 nerve root compromise from degenerative disc disease. He likely has a component of lumbar radiculopathy. Today, however, his symptoms are not primarily related to the radiculopathy. No indication for further advanced imaging, hospitalization or other interventions.  Nursing Notes Reviewed/ Care Coordinated Applicable Imaging Reviewed Interpretation of Laboratory Data incorporated into ED treatment  The patient appears reasonably screened and/or stabilized for discharge and I doubt any other medical condition or other [facility] requiring further screening, evaluation, or treatment in the ED at this time prior to discharge.  Plan: Home Medications- Prednisone; Home Treatments- rest; return here if the recommended treatment, does  not improve the symptoms; Recommended follow up- PCP prn     Mancel Bale, MD 03/05/16 [id number]

## 2016-03-05 NOTE — Discharge Instructions (Signed)
Use heat on the sore areas 3 or 4 times a day. Urine drug screen was positive for opiates, cocaine and benzodiazepines. Do not use drugs, or medications which are not prescribed for you. Follow-up with a primary care doctor for ongoing treatment of your pain.  Degenerative Disk Disease Degenerative disk disease is a condition caused by the changes that occur in spinal disks as you grow older. Spinal disks are soft and compressible disks located between the bones of your spine (vertebrae). These disks act like shock absorbers. Degenerative disk disease can affect the whole spine. However, the neck and lower back are most commonly affected. Many changes can occur in the spinal disks with aging, such as:  The spinal disks may dry and shrink.  Small tears may occur in the tough, outer covering of the disk (annulus).  The disk space may become smaller due to loss of water.  Abnormal growths in the bone (spurs) may occur. This can put pressure on the nerve roots exiting the spinal canal, causing pain.  The spinal canal may become narrowed. RISK FACTORS   Being overweight.  Having a family history of degenerative disk disease.  Smoking.  There is increased risk if you are doing heavy lifting or have a sudden injury. SIGNS AND SYMPTOMS  Symptoms vary from person to person and may include:  Pain that varies in intensity. Some people have no pain, while others have severe pain. The location of the pain depends on the part of your backbone that is affected.  You will have neck or arm pain if a disk in the neck area is affected.  You will have pain in your back, buttocks, or legs if a disk in the lower back is affected.  Pain that becomes worse while bending, reaching up, or with twisting movements.  Pain that may start gradually and then get worse as time passes. It may also start after a major or minor injury.  Numbness or tingling in the arms or legs. DIAGNOSIS  Your health care  provider will ask you about your symptoms and about activities or habits that may cause the pain. He or she may also ask about any injuries, diseases, or treatments you have had. Your health care provider will examine you to check for the range of movement that is possible in the affected area, to check for strength in your extremities, and to check for sensation in the areas of the arms and legs supplied by different nerve roots. You may also have:   An X-ray of the spine.  Other imaging tests, such as MRI. TREATMENT  Your health care provider will advise you on the best plan for treatment. Treatment may include:  Medicines.  Rehabilitation exercises. HOME CARE INSTRUCTIONS   Follow proper lifting and walking techniques as advised by your health care provider.  Maintain good posture.  Exercise regularly as advised by your health care provider.  Perform relaxation exercises.  Change your sitting, standing, and sleeping habits as advised by your health care provider.  Change positions frequently.  Lose weight or maintain a healthy weight as advised by your health care provider.  Do not use any tobacco products, including cigarettes, chewing tobacco, or electronic cigarettes. If you need help quitting, ask your health care provider.  Wear supportive footwear.  Take medicines only as directed by your health care provider. SEEK MEDICAL CARE IF:   Your pain does not go away within 1-4 weeks.  You have significant appetite or weight loss.  SEEK IMMEDIATE MEDICAL CARE IF:   Your pain is severe.  You notice weakness in your arms, hands, or legs.  You begin to lose control of your bladder or bowel movements.  You have fevers or night sweats. MAKE SURE YOU:   Understand these instructions.  Will watch your condition.  Will get help right away if you are not doing well or get worse.   This information is not intended to replace advice given to you by your health care  provider. Make sure you discuss any questions you have with your health care provider.   Document Released: 07/02/2007 Document Revised: 09/25/2014 Document Reviewed: 01/06/2014 Elsevier Interactive Patient Education 2016 [employer]  Radicular Pain Radicular pain in either the arm or leg is usually from a bulging or herniated disk in the spine. A piece of the herniated disk may press against the nerves as the nerves exit the spine. This causes pain which is felt at the tips of the nerves down the arm or leg. Other causes of radicular pain may include:  Fractures.  Heart disease.  Cancer.  An abnormal and usually degenerative state of the nervous system or nerves (neuropathy). Diagnosis may require CT or MRI scanning to determine the primary cause.  Nerves that start at the neck (nerve roots) may cause radicular pain in the outer shoulder and arm. It can spread down to the thumb and fingers. The symptoms vary depending on which nerve root has been affected. In most cases radicular pain improves with conservative treatment. Neck problems may require physical therapy, a neck collar, or cervical traction. Treatment may take many weeks, and surgery may be considered if the symptoms do not improve.  Conservative treatment is also recommended for sciatica. Sciatica causes pain to radiate from the lower back or buttock area down the leg into the foot. Often there is a history of back problems. Most patients with sciatica are better after 2 to 4 weeks of rest and other supportive care. Short term bed rest can reduce the disk pressure considerably. Sitting, however, is not a good position since this increases the pressure on the disk. You should avoid bending, lifting, and all other activities which make the problem worse. Traction can be used in severe cases. Surgery is usually reserved for patients who do not improve within the first months of treatment. Only take over-the-counter or prescription  medicines for pain, discomfort, or fever as directed by your caregiver. Narcotics and muscle relaxants may help by relieving more severe pain and spasm and by providing mild sedation. Cold or massage can give significant relief. Spinal manipulation is not recommended. It can increase the degree of disc protrusion. Epidural steroid injections are often effective treatment for radicular pain. These injections deliver medicine to the spinal nerve in the space between the protective covering of the spinal cord and back bones (vertebrae). Your caregiver can give you more information about steroid injections. These injections are most effective when given within two weeks of the onset of pain.  You should see your caregiver for follow up care as recommended. A program for neck and back injury rehabilitation with stretching and strengthening exercises is an important part of management.  SEEK IMMEDIATE MEDICAL CARE IF:  You develop increased pain, weakness, or numbness in your arm or leg.  You develop difficulty with bladder or bowel control.  You develop abdominal pain.   This information is not intended to replace advice given to you by your health care provider. Make sure  you discuss any questions you have with your health care provider.   Document Released: 10/12/2004 Document Revised: 09/25/2014 Document Reviewed: 03/31/2015 Elsevier Interactive Patient Education 2016 [employer].  Polysubstance Abuse When people abuse more than one drug or type of drug it is called polysubstance or polydrug abuse. For example, many smokers also drink alcohol. This is one form of polydrug abuse. Polydrug abuse also refers to the use of a drug to counteract an unpleasant effect produced by another drug. It may also be used to help with withdrawal from another drug. People who take stimulants may become agitated. Sometimes this agitation is countered with a tranquilizer. This helps protect against the unpleasant side  effects. Polydrug abuse also refers to the use of different drugs at the same time.  Anytime drug use is interfering with normal living activities, it has become abuse. This includes problems with family and friends. Psychological dependence has developed when your mind tells you that the drug is needed. This is usually followed by physical dependence which has developed when continuing increases of drug are required to get the same feeling or "high". This is known as addiction or chemical dependency. A person's risk is much higher if there is a history of chemical dependency in the family. SIGNS OF CHEMICAL DEPENDENCY  You have been told by friends or family that drugs have become a problem.  You fight when using drugs.  You are having blackouts (not remembering what you do while using).  You feel sick from using drugs but continue using.  You lie about use or amounts of drugs (chemicals) used.  You need chemicals to get you going.  You are suffering in work performance or in school because of drug use.  You get sick from use of drugs but continue to use anyway.  You need drugs to relate to people or feel comfortable in social situations.  You use drugs to forget problems. "Yes" answered to any of the above signs of chemical dependency indicates there are problems. The longer the use of drugs continues, the greater the problems will become. If there is a family history of drug or alcohol use, it is best not to experiment with these drugs. Continual use leads to tolerance. After tolerance develops more of the drug is needed to get the same feeling. This is followed by addiction. With addiction, drugs become the most important part of life. It becomes more important to take drugs than participate in the other usual activities of life. This includes relating to friends and family. Addiction is followed by dependency. Dependency is a condition where drugs are now needed not just to get high, but  to feel normal. Addiction cannot be cured but it can be stopped. This often requires outside help and the care of professionals. Treatment centers are listed in the yellow pages under: Cocaine, Narcotics, and Alcoholics Anonymous. Most hospitals and clinics can refer you to a specialized care center. Talk to your caregiver if you need help.   This information is not intended to replace advice given to you by your health care provider. Make sure you discuss any questions you have with your health care provider.   Document Released: 04/26/2005 Document Revised: 11/27/2011 Document Reviewed: 09/09/2014 Elsevier Interactive Patient Education [employer]

## 2016-03-05 NOTE — ED Notes (Signed)
Pt states he has been having pain for a while in his lower back and legs, worsening over the past few days.  States he was in prison for 90 days and was very sedentary when it began.

## 2016-06-22 DIAGNOSIS — Z23 Encounter for immunization: Secondary | ICD-10-CM | POA: Diagnosis not present

## 2017-02-23 ENCOUNTER — Encounter (HOSPITAL_COMMUNITY): Payer: Self-pay | Admitting: Emergency Medicine

## 2017-02-23 ENCOUNTER — Emergency Department (HOSPITAL_COMMUNITY)
Admission: EM | Admit: 2017-02-23 | Discharge: 2017-02-24 | Disposition: A | Discharge status: Home / Self Care | Payer: Medicare Other | Attending: Emergency Medicine | Admitting: Emergency Medicine

## 2017-02-23 DIAGNOSIS — M79661 Pain in right lower leg: Secondary | ICD-10-CM | POA: Diagnosis present

## 2017-02-23 DIAGNOSIS — F17210 Nicotine dependence, cigarettes, uncomplicated: Secondary | ICD-10-CM | POA: Diagnosis not present

## 2017-02-23 DIAGNOSIS — M79605 Pain in left leg: Secondary | ICD-10-CM

## 2017-02-23 DIAGNOSIS — M79604 Pain in right leg: Secondary | ICD-10-CM

## 2017-02-23 DIAGNOSIS — M79662 Pain in left lower leg: Secondary | ICD-10-CM | POA: Diagnosis not present

## 2017-02-23 HISTORY — DX: Other psychoactive substance abuse, uncomplicated: F19.10

## 2017-02-23 LAB — CBC WITH DIFFERENTIAL/PLATELET
Basophils Absolute: 0 10*3/uL (ref 0.0–0.1)
Basophils Relative: 0 %
Eosinophils Absolute: 0.2 10*3/uL (ref 0.0–0.7)
Eosinophils Relative: 2 %
HEMATOCRIT: 36.3 % — AB (ref 39.0–52.0)
HEMOGLOBIN: 12.6 g/dL — AB (ref 13.0–17.0)
LYMPHS ABS: 2.6 10*3/uL (ref 0.7–4.0)
Lymphocytes Relative: 27 %
MCH: 30.1 pg (ref 26.0–34.0)
MCHC: 34.7 g/dL (ref 30.0–36.0)
MCV: 86.6 fL (ref 78.0–100.0)
Monocytes Absolute: 1 10*3/uL (ref 0.1–1.0)
Monocytes Relative: 11 %
NEUTROS ABS: 6 10*3/uL (ref 1.7–7.7)
NEUTROS PCT: 60 %
Platelets: 171 10*3/uL (ref 150–400)
RBC: 4.19 MIL/uL — ABNORMAL LOW (ref 4.22–5.81)
RDW: 12.9 % (ref 11.5–15.5)
WBC: 9.8 10*3/uL (ref 4.0–10.5)

## 2017-02-23 LAB — COMPREHENSIVE METABOLIC PANEL
ALK PHOS: 50 U/L (ref 38–126)
ALT: 65 U/L — ABNORMAL HIGH (ref 17–63)
ANION GAP: 10 (ref 5–15)
AST: 46 U/L — ABNORMAL HIGH (ref 15–41)
Albumin: 3.6 g/dL (ref 3.5–5.0)
BILIRUBIN TOTAL: 0.7 mg/dL (ref 0.3–1.2)
BUN: 14 mg/dL (ref 6–20)
CO2: 26 mmol/L (ref 22–32)
Calcium: 8.6 mg/dL — ABNORMAL LOW (ref 8.9–10.3)
Chloride: 98 mmol/L — ABNORMAL LOW (ref 101–111)
Creatinine, Ser: 0.76 mg/dL (ref 0.61–1.24)
Glucose, Bld: 100 mg/dL — ABNORMAL HIGH (ref 65–99)
Potassium: 3.9 mmol/L (ref 3.5–5.1)
Sodium: 134 mmol/L — ABNORMAL LOW (ref 135–145)
TOTAL PROTEIN: 7.4 g/dL (ref 6.5–8.1)

## 2017-02-23 LAB — CK: Total CK: 505 U/L — ABNORMAL HIGH (ref 49–397)

## 2017-02-23 MED ORDER — KETOROLAC TROMETHAMINE 30 MG/ML IJ SOLN
30.0000 mg | Freq: Once | INTRAMUSCULAR | Status: AC
  Administered 2017-02-24: 30 mg via INTRAVENOUS
  Filled 2017-02-23: qty 1

## 2017-02-23 MED ORDER — PREDNISONE 10 MG PO TABS: 10.0000 mg | ORAL_TABLET | Freq: Every day | ORAL | 0 refills | Status: DC

## 2017-02-23 MED ORDER — ONDANSETRON HCL 4 MG/2ML IJ SOLN
4.0000 mg | Freq: Once | INTRAMUSCULAR | Status: AC
  Administered 2017-02-23: 4 mg via INTRAVENOUS
  Filled 2017-02-23: qty 2

## 2017-02-23 MED ORDER — SODIUM CHLORIDE 0.9 % IV BOLUS (SEPSIS)
1000.0000 mL | Freq: Once | INTRAVENOUS | Status: AC
Start: 2017-02-23 — End: 2017-02-24
  Administered 2017-02-23: 1000 mL via INTRAVENOUS

## 2017-02-23 MED ORDER — OXYCODONE HCL 5 MG PO TABS: 5.0000 mg | ORAL_TABLET | ORAL | 0 refills | Status: DC | PRN

## 2017-02-23 MED ORDER — HYDROMORPHONE HCL 1 MG/ML IJ SOLN
1.0000 mg | Freq: Once | INTRAMUSCULAR | Status: AC
  Administered 2017-02-23: 1 mg via INTRAVENOUS
  Filled 2017-02-23: qty 1

## 2017-02-23 MED ORDER — DEXAMETHASONE SODIUM PHOSPHATE 4 MG/ML IJ SOLN
4.0000 mg | Freq: Once | INTRAMUSCULAR | Status: AC
  Administered 2017-02-24: 4 mg via INTRAVENOUS
  Filled 2017-02-23: qty 1

## 2017-02-23 NOTE — Discharge Instructions (Signed)
Prescription for prednisone and pain medicine. Increase fluids. You need a primary care physician.

## 2017-02-23 NOTE — ED Provider Notes (Signed)
[facility] DEPT Provider Note   CSN: [id number] Arrival date & time: 02/23/17  2007     History   Chief Complaint Chief Complaint  Patient presents with  . Leg Pain    HPI [name] is a 51 y.o. male.  Patient presents with bilateral leg pain since earlier today. It is painful for him to walk through the house. He has an extensive past medical history including myositis, chronic pain, cirrhosis, hepatitis C, rhabdomyolysis.  No chest pain, dyspnea, fever, sweats, chills.  Severity of pain is moderate. Nothing makes symptoms better or worse.      Past Medical History:  Diagnosis Date  . Acute renal failure (HCC)    Secondary to rhabdo. Treated with dialysis short-term.  . Alcohol abuse    Psychiatric admissions for alcohol and drug abuse  . Back pain, chronic   . Chronic leg pain   . Chronic pain syndrome   . Cirrhosis (HCC)   . Depression    Multiple psychiatric admissions  . Hepatitis C   . History of cocaine abuse   . Low TSH level January 2013   Normal free T4 of 1.06  . Myositis    Left deltoid biopsy at [facility] 10/11/11.  . 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 Polysubstance abuse   . Rhabdomyolysis   . Tattoos   . Tobacco abuse     Patient Active Problem List   Diagnosis Date Noted  . Chronic low back pain 09/22/2015  . Bulging lumbar disc 09/22/2015  . Thrombocytopenia (HCC) 09/22/2015  . Chronic hepatitis C (HCC) 09/22/2015  . Alcohol dependence (HCC) 06/30/2014  . Polysubstance abuse 05/02/2013  . Nausea, vomiting and diarrhea 05/02/2013  . Generalized muscle ache 09/06/2012  . 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 Leukocytosis 09/06/2012  . Muscle weakness (generalized) 09/06/2012  . UTI (urinary tract infection) 09/06/2012  . Hyperkalemia 05/28/2012  . Rhabdomyolysis 05/28/2012  . Acute renal failure (HCC) 05/28/2012  . Hyponatremia 05/28/2012  . Elevated liver enzymes 05/28/2012  . Metabolic encephalopathy 12/11/2011  . Overdose 12/11/2011  .  Encephalopathy 10/17/2011  . Tachycardia 10/17/2011  . Dehydration 10/17/2011  . Hypernatremia 10/17/2011  . Hepatitis C 10/17/2011  . Elevated LFTs 10/17/2011  . Alcohol abuse 10/17/2011  . Alcohol intoxication (HCC) 10/17/2011  . Muscular disease 10/17/2011  . Chronic pain syndrome 10/17/2011  . BACK PAIN 06/17/2010  . Myalgia and myositis 06/17/2010  . LEG PAIN, RIGHT 06/17/2010  . TOBACCO ABUSE 11/25/2009  . HEPATITIS C 09/18/1996    Past Surgical History:  Procedure Laterality Date  . APPENDECTOMY    . HERNIA REPAIR    . MUSCLE BIOPSY         Home Medications    Prior to Admission medications   Medication Sig Start Date End Date Taking? Authorizing Provider  oxyCODONE (ROXICODONE) 5 MG immediate release tablet Take 1 tablet (5 mg total) by mouth every 4 (four) hours as needed for severe pain. 02/23/17   Donnetta Hutching, MD  predniSONE (DELTASONE) 10 MG tablet Take 1 tablet (10 mg total) by mouth daily with breakfast. 3 tablets for 3 days, 2 tablets for 3 days, one tablet for 3 days. 02/23/17   Donnetta Hutching, MD    Family History Family History  Problem Relation Age of Onset  . Hypertension Mother   . Cancer Father        throat cancer   . Diabetes Brother     Social History Social History  Substance Use Topics  .  Smoking status: Current Every Day Smoker    Packs/day: 1.00    Years: 40.00    Types: Cigarettes  . Smokeless tobacco: Never Used  . Alcohol use 60.0 oz/week    100 Cans of beer per week     Comment: daily     Allergies   Acetaminophen   Review of Systems Review of Systems  All other systems reviewed and are negative.    Physical Exam Updated Vital Signs BP 111/73 (BP Location: Right Arm)   Pulse (!) 108   Temp 99.7 F (37.6 C) (Oral)   Resp 18   Ht 5\' 8"  (1.727 m)   Wt 68 kg (150 lb)   SpO2 95%   BMI 22.81 kg/m   Physical Exam  Constitutional: He is oriented to person, place, and time. He appears well-developed and well-nourished.   HENT:  Head: Normocephalic and atraumatic.  Eyes: Conjunctivae are normal.  Neck: Neck supple.  Cardiovascular: Normal rate and regular rhythm.   Pulmonary/Chest: Effort normal and breath sounds normal.  Abdominal: Soft. Bowel sounds are normal.  Musculoskeletal: Normal range of motion.  Legs were examined and no abnormalities noted.  Neurological: He is alert and oriented to person, place, and time.  Skin: Skin is warm and dry.  Psychiatric: He has a normal mood and affect. His behavior is normal.  Nursing note and vitals reviewed.    ED Treatments / Results  Labs (all labs ordered are listed, but only abnormal results are displayed) Labs Reviewed  CBC WITH DIFFERENTIAL/PLATELET - Abnormal; Notable for the following:       Result Value   RBC 4.19 (*)    Hemoglobin 12.6 (*)    HCT 36.3 (*)    All other components within normal limits  COMPREHENSIVE METABOLIC PANEL - Abnormal; Notable for the following:    Sodium 134 (*)    Chloride 98 (*)    Glucose, Bld 100 (*)    Calcium 8.6 (*)    AST 46 (*)    ALT 65 (*)    All other components within normal limits  CK - Abnormal; Notable for the following:    Total CK 505 (*)    All other components within normal limits    EKG  EKG Interpretation None       Radiology No results found.  Procedures Procedures (including critical care time)  Medications Ordered in ED Medications  ketorolac (TORADOL) 30 MG/ML injection 30 mg (not administered)  dexamethasone (DECADRON) injection 4 mg (not administered)  sodium chloride 0.9 % bolus 1,000 mL (1,000 mLs Intravenous New Bag/Given 02/23/17 2250)  ondansetron (ZOFRAN) injection 4 mg (4 mg Intravenous Given 02/23/17 2250)  HYDROmorphone (DILAUDID) injection 1 mg (1 mg Intravenous Given 02/23/17 2250)     Initial Impression / Assessment and Plan / ED Course  I have reviewed the triage vital signs and the nursing notes.  Pertinent labs & imaging results that were available during  my care of the patient were reviewed by me and considered in my medical decision making (see chart for details).     Patient appears in no acute distress. CPK mildly elevated at 505. Renal function normal. He is hemodynamically stable. He was given IV fluids, pain medicine, steroids in the ED. Discharge medications prednisone and Roxicodone.  Final Clinical Impressions(s) / ED Diagnoses   Final diagnoses:  Bilateral lower extremity pain    New Prescriptions New Prescriptions   OXYCODONE (ROXICODONE) 5 MG IMMEDIATE RELEASE TABLET  Take 1 tablet (5 mg total) by mouth every 4 (four) hours as needed for severe pain.   PREDNISONE (DELTASONE) 10 MG TABLET    Take 1 tablet (10 mg total) by mouth daily with breakfast. 3 tablets for 3 days, 2 tablets for 3 days, one tablet for 3 days.     Donnetta Hutching, MD 02/23/17 [phone]

## 2017-02-23 NOTE — ED Triage Notes (Signed)
Pain in both legs, states he has a muscle disease and is admitted sometimes for this pain, states he has trouble with his enzymes causing problems

## 2017-02-23 NOTE — ED Triage Notes (Signed)
Pt reports awakened 3 hours ago with knee pain  Pt reports no  PCP

## 2017-12-10 ENCOUNTER — Other Ambulatory Visit: Payer: Self-pay

## 2017-12-10 ENCOUNTER — Emergency Department (HOSPITAL_COMMUNITY)
Admission: EM | Admit: 2017-12-10 | Discharge: 2017-12-10 | Disposition: A | Discharge status: Home / Self Care | Payer: Medicare HMO | Attending: Emergency Medicine | Admitting: Emergency Medicine

## 2017-12-10 ENCOUNTER — Encounter (HOSPITAL_COMMUNITY): Payer: Self-pay | Admitting: *Deleted

## 2017-12-10 DIAGNOSIS — M79606 Pain in leg, unspecified: Secondary | ICD-10-CM | POA: Diagnosis not present

## 2017-12-10 DIAGNOSIS — Z5321 Procedure and treatment not carried out due to patient leaving prior to being seen by health care provider: Secondary | ICD-10-CM | POA: Insufficient documentation

## 2017-12-10 DIAGNOSIS — R531 Weakness: Secondary | ICD-10-CM | POA: Diagnosis not present

## 2017-12-10 DIAGNOSIS — R404 Transient alteration of awareness: Secondary | ICD-10-CM | POA: Diagnosis not present

## 2017-12-10 NOTE — ED Triage Notes (Signed)
Pt c/o bilateral leg pain x 2 weeks. Pt reports hx of myositis. Pain with ambulation.

## 2017-12-14 ENCOUNTER — Emergency Department (HOSPITAL_COMMUNITY): Payer: Medicare HMO

## 2017-12-14 ENCOUNTER — Emergency Department (HOSPITAL_COMMUNITY)
Admission: EM | Admit: 2017-12-14 | Discharge: 2017-12-14 | Disposition: A | Discharge status: Home / Self Care | Payer: Medicare HMO | Attending: Emergency Medicine | Admitting: Emergency Medicine

## 2017-12-14 ENCOUNTER — Other Ambulatory Visit: Payer: Self-pay

## 2017-12-14 ENCOUNTER — Encounter (HOSPITAL_COMMUNITY): Payer: Self-pay | Admitting: Emergency Medicine

## 2017-12-14 DIAGNOSIS — M791 Myalgia, unspecified site: Secondary | ICD-10-CM | POA: Diagnosis not present

## 2017-12-14 DIAGNOSIS — M545 Low back pain: Secondary | ICD-10-CM | POA: Diagnosis not present

## 2017-12-14 DIAGNOSIS — R109 Unspecified abdominal pain: Secondary | ICD-10-CM | POA: Diagnosis not present

## 2017-12-14 DIAGNOSIS — R1084 Generalized abdominal pain: Secondary | ICD-10-CM | POA: Insufficient documentation

## 2017-12-14 DIAGNOSIS — G8929 Other chronic pain: Secondary | ICD-10-CM | POA: Diagnosis not present

## 2017-12-14 DIAGNOSIS — M7918 Myalgia, other site: Secondary | ICD-10-CM | POA: Diagnosis not present

## 2017-12-14 DIAGNOSIS — F17210 Nicotine dependence, cigarettes, uncomplicated: Secondary | ICD-10-CM | POA: Insufficient documentation

## 2017-12-14 HISTORY — DX: Hypo-osmolality and hyponatremia: E87.1

## 2017-12-14 HISTORY — DX: Thrombocytopenia, unspecified: D69.6

## 2017-12-14 LAB — COMPREHENSIVE METABOLIC PANEL
ALK PHOS: 41 U/L (ref 38–126)
ALT: 23 U/L (ref 17–63)
ANION GAP: 10 (ref 5–15)
AST: 26 U/L (ref 15–41)
Albumin: 3.7 g/dL (ref 3.5–5.0)
BUN: 9 mg/dL (ref 6–20)
CALCIUM: 9.3 mg/dL (ref 8.9–10.3)
CO2: 25 mmol/L (ref 22–32)
Chloride: 94 mmol/L — ABNORMAL LOW (ref 101–111)
Creatinine, Ser: 0.81 mg/dL (ref 0.61–1.24)
Glucose, Bld: 77 mg/dL (ref 65–99)
Potassium: 4.5 mmol/L (ref 3.5–5.1)
SODIUM: 129 mmol/L — AB (ref 135–145)
TOTAL PROTEIN: 7.1 g/dL (ref 6.5–8.1)
Total Bilirubin: 0.9 mg/dL (ref 0.3–1.2)

## 2017-12-14 LAB — CBC WITH DIFFERENTIAL/PLATELET
BASOS ABS: 0 10*3/uL (ref 0.0–0.1)
BASOS PCT: 0 %
EOS ABS: 0.3 10*3/uL (ref 0.0–0.7)
Eosinophils Relative: 3 %
HCT: 42.4 % (ref 39.0–52.0)
Hemoglobin: 14.5 g/dL (ref 13.0–17.0)
Lymphocytes Relative: 27 %
Lymphs Abs: 2.1 10*3/uL (ref 0.7–4.0)
MCH: 30.9 pg (ref 26.0–34.0)
MCHC: 34.2 g/dL (ref 30.0–36.0)
MCV: 90.2 fL (ref 78.0–100.0)
Monocytes Absolute: 1 10*3/uL (ref 0.1–1.0)
Monocytes Relative: 13 %
Neutro Abs: 4.4 10*3/uL (ref 1.7–7.7)
Neutrophils Relative %: 57 %
PLATELETS: 257 10*3/uL (ref 150–400)
RBC: 4.7 MIL/uL (ref 4.22–5.81)
RDW: 12.8 % (ref 11.5–15.5)
WBC: 7.8 10*3/uL (ref 4.0–10.5)

## 2017-12-14 LAB — URINALYSIS, ROUTINE W REFLEX MICROSCOPIC
BILIRUBIN URINE: NEGATIVE
Glucose, UA: NEGATIVE mg/dL
HGB URINE DIPSTICK: NEGATIVE
Ketones, ur: NEGATIVE mg/dL
Leukocytes, UA: NEGATIVE
Nitrite: NEGATIVE
PH: 6 (ref 5.0–8.0)
Protein, ur: NEGATIVE mg/dL
SPECIFIC GRAVITY, URINE: 1.006 (ref 1.005–1.030)

## 2017-12-14 LAB — ETHANOL

## 2017-12-14 LAB — RAPID URINE DRUG SCREEN, HOSP PERFORMED
AMPHETAMINES: NOT DETECTED
BENZODIAZEPINES: NOT DETECTED
Barbiturates: NOT DETECTED
Cocaine: POSITIVE — AB
OPIATES: NOT DETECTED
Tetrahydrocannabinol: NOT DETECTED

## 2017-12-14 LAB — CK: CK TOTAL: 519 U/L — AB (ref 49–397)

## 2017-12-14 MED ORDER — SODIUM CHLORIDE 0.9 % IV BOLUS
1000.0000 mL | Freq: Once | INTRAVENOUS | Status: AC
  Administered 2017-12-14: 1000 mL via INTRAVENOUS

## 2017-12-14 MED ORDER — METHYLPREDNISOLONE 4 MG PO TBPK: ORAL_TABLET | ORAL | 0 refills | Status: DC

## 2017-12-14 NOTE — ED Provider Notes (Signed)
[facility] EMERGENCY DEPARTMENT Provider Note   CSN: [id number] Arrival date & time: 12/14/17  0631     History   Chief Complaint Chief Complaint  Patient presents with  . Flank Pain    HPI [name] is a 52 y.o. male.  HPI Pt was seen at 0710. Per pt, c/o gradual onset and persistence of constant acute flair of his chronic low back "pain" for the past several weeks.  Denies any change in his usual chronic pain pattern.  Pain worsens with palpation of the area and body position changes. Pt also states he feels he "needs my enzymes checked" because his bilat LE's "hurt" for the past several weeks. Describes this pain as acute flair of his chronic bilat LE's pain. Denies incont/retention of bowel or bladder, no saddle anesthesia, no focal motor weakness, no tingling/numbness in extremities, no fevers, no injury, no abd pain, no CP/SOB, no rash.     Past Medical History:  Diagnosis Date  . Acute renal failure (HCC)    Secondary to rhabdo. Treated with dialysis short-term.  . Alcohol abuse    Psychiatric admissions for alcohol and drug abuse  . Back pain, chronic   . Chronic leg pain   . Chronic pain syndrome   . Cirrhosis (HCC)   . Depression    Multiple psychiatric admissions  . Hepatitis C   . History of cocaine abuse   . Low TSH level January 2013   Normal free T4 of 1.06  . Myositis    Left deltoid biopsy at [facility] 10/11/11.  . 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 Polysubstance abuse (HCC)   . Rhabdomyolysis   . Tattoos   . Tobacco abuse     Patient Active Problem List   Diagnosis Date Noted  . Chronic low back pain 09/22/2015  . Bulging lumbar disc 09/22/2015  . Thrombocytopenia (HCC) 09/22/2015  . Chronic hepatitis C (HCC) 09/22/2015  . Alcohol dependence (HCC) 06/30/2014  . Polysubstance abuse (HCC) 05/02/2013  . Nausea, vomiting and diarrhea 05/02/2013  . Generalized muscle ache 09/06/2012  . 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 Leukocytosis 09/06/2012  .  Muscle weakness (generalized) 09/06/2012  . UTI (urinary tract infection) 09/06/2012  . Hyperkalemia 05/28/2012  . Rhabdomyolysis 05/28/2012  . Acute renal failure (HCC) 05/28/2012  . Hyponatremia 05/28/2012  . Elevated liver enzymes 05/28/2012  . Metabolic encephalopathy 12/11/2011  . Overdose 12/11/2011  . Encephalopathy 10/17/2011  . Tachycardia 10/17/2011  . Dehydration 10/17/2011  . Hypernatremia 10/17/2011  . Hepatitis C 10/17/2011  . Elevated LFTs 10/17/2011  . Alcohol abuse 10/17/2011  . Alcohol intoxication (HCC) 10/17/2011  . Muscular disease 10/17/2011  . Chronic pain syndrome 10/17/2011  . BACK PAIN 06/17/2010  . Myalgia and myositis 06/17/2010  . LEG PAIN, RIGHT 06/17/2010  . TOBACCO ABUSE 11/25/2009  . HEPATITIS C 09/18/1996    Past Surgical History:  Procedure Laterality Date  . APPENDECTOMY    . HERNIA REPAIR    . MUSCLE BIOPSY          Home Medications    Prior to Admission medications   Medication Sig Start Date End Date Taking? Authorizing Provider  oxyCODONE (ROXICODONE) 5 MG immediate release tablet Take 1 tablet (5 mg total) by mouth every 4 (four) hours as needed for severe pain. 02/23/17   Donnetta Hutching, MD  predniSONE (DELTASONE) 10 MG tablet Take 1 tablet (10 mg total) by mouth daily with breakfast. 3 tablets for 3 days, 2 tablets for 3 days,  one tablet for 3 days. 02/23/17   Donnetta Hutching, MD    Family History Family History  Problem Relation Age of Onset  . Hypertension Mother   . Cancer Father        throat cancer   . Diabetes Brother     Social History Social History   Tobacco Use  . Smoking status: Current Every Day Smoker    Packs/day: 1.00    Years: 40.00    Pack years: 40.00    Types: Cigarettes  . Smokeless tobacco: Never Used  Substance Use Topics  . Alcohol use: Yes    Alcohol/week: 60.0 oz    Types: 100 Cans of beer per week    Comment: daily  . Drug use: Yes    Types: Cocaine, Marijuana     Allergies     Acetaminophen   Review of Systems Review of Systems ROS: Statement: All systems negative except as marked or noted in the HPI; Constitutional: Negative for fever and chills. ; ; Eyes: Negative for eye pain, redness and discharge. ; ; ENMT: Negative for ear pain, hoarseness, nasal congestion, sinus pressure and sore throat. ; ; Cardiovascular: Negative for chest pain, palpitations, diaphoresis, dyspnea and peripheral edema. ; ; Respiratory: Negative for cough, wheezing and stridor. ; ; Gastrointestinal: Negative for nausea, vomiting, diarrhea, abdominal pain, blood in stool, hematemesis, jaundice and rectal bleeding. . ; ; Genitourinary: Negative for dysuria, flank pain and hematuria. ; ; Musculoskeletal: +chronic LBP and legs pains. Negative for neck pain. Negative for swelling and trauma.; ; Skin: Negative for pruritus, rash, abrasions, blisters, bruising and skin lesion.; ; Neuro: Negative for headache, lightheadedness and neck stiffness. Negative for weakness, altered level of consciousness, altered mental status, extremity weakness, paresthesias, involuntary movement, seizure and syncope.       Physical Exam Updated Vital Signs BP (!) 173/98 (BP Location: Right Arm)   Pulse 94   Temp 98.1 F (36.7 C) (Oral)   Resp 16   Ht 5\' 8"  (1.727 m)   Wt 68 kg (150 lb)   SpO2 100%   BMI 22.81 kg/m    BP (!) 162/95   Pulse 78   Temp 98.1 F (36.7 C) (Oral)   Resp 16   Ht 5\' 8"  (1.727 m)   Wt 68 kg (150 lb)   SpO2 99%   BMI 22.81 kg/m    Physical Exam 0715: Physical examination:  Nursing notes reviewed; Vital signs and O2 SAT reviewed;  Constitutional: Well developed, Well nourished, Well hydrated, In no acute distress; Head:  Normocephalic, atraumatic; Eyes: EOMI, PERRL, No scleral icterus; ENMT: Mouth and pharynx normal, Mucous membranes moist; Neck: Supple, Full range of motion, No lymphadenopathy; Cardiovascular: Regular rate and rhythm, No gallop; Respiratory: Breath sounds clear &  equal bilaterally, No wheezes.  Speaking full sentences with ease, Normal respiratory effort/excursion; Chest: Nontender, Movement normal; Abdomen: Soft, Nontender, Nondistended, Normal bowel sounds; Genitourinary: No CVA tenderness; Spine:  No midline CS, TS, LS tenderness. +mild TTP left lumbar paraspinal muscles. No rash.;; Extremities: Peripheral pulses normal, No tenderness, Bilat LE's muscles compartments soft. No deformity. No rash. No edema, No calf edema or asymmetry.; Neuro: AA&Ox3, Major CN grossly intact.  Speech clear. No gross focal motor or sensory deficits in extremities.; Skin: Color normal, Warm, Dry.   ED Treatments / Results  Labs (all labs ordered are listed, but only abnormal results are displayed)   EKG None  Radiology   Procedures Procedures (including critical care time)  Medications Ordered  in ED Medications - No data to display   Initial Impression / Assessment and Plan / ED Course  I have reviewed the triage vital signs and the nursing notes.  Pertinent labs & imaging results that were available during my care of the patient were reviewed by me and considered in my medical decision making (see chart for details).  MDM Reviewed: previous chart, nursing note and vitals Reviewed previous: labs and MRI Interpretation: labs and CT scan    Results for orders placed or performed during the hospital encounter of 12/14/17  Comprehensive metabolic panel  Result Value Ref Range   Sodium 129 (L) 135 - 145 mmol/L   Potassium 4.5 3.5 - 5.1 mmol/L   Chloride 94 (L) 101 - 111 mmol/L   CO2 25 22 - 32 mmol/L   Glucose, Bld 77 65 - 99 mg/dL   BUN 9 6 - 20 mg/dL   Creatinine, Ser 4.09 0.61 - 1.24 mg/dL   Calcium 9.3 8.9 - 81.1 mg/dL   Total Protein 7.1 6.5 - 8.1 g/dL   Albumin 3.7 3.5 - 5.0 g/dL   AST 26 15 - 41 U/L   ALT 23 17 - 63 U/L   Alkaline Phosphatase 41 38 - 126 U/L   Total Bilirubin 0.9 0.3 - 1.2 mg/dL   GFR calc non Af Amer >60 >60 mL/min   GFR  calc Af Amer >60 >60 mL/min   Anion gap 10 5 - 15  Ethanol  Result Value Ref Range   Alcohol, Ethyl (B) <10 <10 mg/dL  CBC with Differential  Result Value Ref Range   WBC 7.8 4.0 - 10.5 K/uL   RBC 4.70 4.22 - 5.81 MIL/uL   Hemoglobin 14.5 13.0 - 17.0 g/dL   HCT 91.4 78.2 - 95.6 %   MCV 90.2 78.0 - 100.0 fL   MCH 30.9 26.0 - 34.0 pg   MCHC 34.2 30.0 - 36.0 g/dL   RDW 21.3 08.6 - 57.8 %   Platelets 257 150 - 400 K/uL   Neutrophils Relative % 57 %   Neutro Abs 4.4 1.7 - 7.7 K/uL   Lymphocytes Relative 27 %   Lymphs Abs 2.1 0.7 - 4.0 K/uL   Monocytes Relative 13 %   Monocytes Absolute 1.0 0.1 - 1.0 K/uL   Eosinophils Relative 3 %   Eosinophils Absolute 0.3 0.0 - 0.7 K/uL   Basophils Relative 0 %   Basophils Absolute 0.0 0.0 - 0.1 K/uL  CK  Result Value Ref Range   Total CK 519 (H) 49 - 397 U/L  Urine rapid drug screen (hosp performed)  Result Value Ref Range   Opiates NONE DETECTED NONE DETECTED   Cocaine POSITIVE (A) NONE DETECTED   Benzodiazepines NONE DETECTED NONE DETECTED   Amphetamines NONE DETECTED NONE DETECTED   Tetrahydrocannabinol NONE DETECTED NONE DETECTED   Barbiturates NONE DETECTED NONE DETECTED  Urinalysis, Routine w reflex microscopic  Result Value Ref Range   Color, Urine STRAW (A) YELLOW   APPearance CLEAR CLEAR   Specific Gravity, Urine 1.006 1.005 - 1.030   pH 6.0 5.0 - 8.0   Glucose, UA NEGATIVE NEGATIVE mg/dL   Hgb urine dipstick NEGATIVE NEGATIVE   Bilirubin Urine NEGATIVE NEGATIVE   Ketones, ur NEGATIVE NEGATIVE mg/dL   Protein, ur NEGATIVE NEGATIVE mg/dL   Nitrite NEGATIVE NEGATIVE   Leukocytes, UA NEGATIVE NEGATIVE   Ct Renal Stone Study Result Date: 12/14/2017 CLINICAL DATA:  Left flank pain EXAM: CT ABDOMEN AND PELVIS WITHOUT CONTRAST  TECHNIQUE: Multidetector CT imaging of the abdomen and pelvis was performed following the standard protocol without IV contrast. COMPARISON:  CT 09/06/2012 FINDINGS: Lower chest: Lung bases are clear. No  effusions. Heart is normal size. Old right posterior 11th rib fracture. Hepatobiliary: No focal hepatic abnormality. Gallbladder unremarkable. Pancreas: No focal abnormality or ductal dilatation. Spleen: No focal abnormality.  Normal size. Adrenals/Urinary Tract: No adrenal abnormality. No focal renal abnormality. No stones or hydronephrosis. Urinary bladder is unremarkable. Stomach/Bowel: Stomach, large and small bowel grossly unremarkable. Vascular/Lymphatic: Aortic atherosclerosis. No enlarged abdominal or pelvic lymph nodes. Reproductive: No visible focal abnormality. Other: No free fluid or free air. Musculoskeletal: Degenerative disc disease at L5-S1. No acute bony abnormality. IMPRESSION: No renal or ureteral stones.  No hydronephrosis. No acute findings in the abdomen or pelvis. Aortic atherosclerosis. Electronically Signed   By: Charlett NoseKevin  Dover M.D.   On: 12/14/2017 09:17   Results for Erich MontaneIX, Sena W (MRN [id number]) as of 12/14/2017 10:26  Ref. Range 09/22/2015 10:39 09/23/2015 05:46 03/05/2016 16:04 02/23/2017 22:11 12/14/2017 07:39  CK Total Latest Ref Range: 49 - 397 U/L 9,207 (H) 3,292 (H) 494 (H) 505 (H) 519 (H)   Results for Erich MontaneIX, Tylique W (MRN [id number]) as of 12/14/2017 10:26  Ref. Range 12/29/2014 22:11 12/30/2014 06:39 09/22/2015 10:39 09/23/2015 05:46 03/05/2016 16:04 02/23/2017 22:11 12/14/2017 07:39  Sodium Latest Ref Range: 135 - 145 mmol/L 134 (L) 136 129 (L) 131 (L) 134 (L) 134 (L) 129 (L)    1025:  IVF bolus given for chronically elevated CK. Labs per those previous on file. Will not rx narcotics for pt's chronic pain, given pt's hx of and ongoing substance abuse. Long hx of chronic pain with multiple ED visits for same.  Pt endorses acute flair of his usual long standing chronic pain today, no change from his usual chronic pain pattern.  Pt encouraged to establish with a PMD and Pain Management doctor for good continuity of care and control of his chronic pain.  Pt verb understanding. Dx and testing d/w  pt.  Questions answered.  Verb understanding, agreeable to d/c home with outpt f/u.     Final Clinical Impressions(s) / ED Diagnoses   Final diagnoses:  None    ED Discharge Orders    None       Samuel JesterMcManus, Thorne Wirz, DO 12/16/17 1434

## 2017-12-14 NOTE — Discharge Instructions (Signed)
Take the prescription as directed.  Apply moist heat or ice to the area(s) of discomfort, for 15 minutes at a time, several times per day for the next few days.  Do not fall asleep on a heating or ice pack.  Call your regular medical doctor today to schedule a follow up appointment in the next 3 days. Call the pain management doctor today to schedule a follow up appointment within the next week.  Return to the Emergency Department immediately if worsening.

## 2017-12-14 NOTE — ED Triage Notes (Signed)
Pt states he has been having left flank pain for the past couple of weeks that has gotten worse over the past couple of days. Pt states the pain is a constant, dull throbbing pain. Pt states the pain is a level 10 on a scale of 0 to 10. Pt denies pain with or difficulty urinating,also denies blood in urine. Pt states his urine usually clear but lately it has been dark yellow. Pt denies bowel problems, last BM was last night.

## 2017-12-14 NOTE — ED Notes (Signed)
Pt requesting pain meds

## 2018-11-09 ENCOUNTER — Encounter (HOSPITAL_COMMUNITY): Payer: Self-pay | Admitting: Emergency Medicine

## 2018-11-09 ENCOUNTER — Inpatient Hospital Stay (HOSPITAL_COMMUNITY)
Admission: EM | Admit: 2018-11-09 | Discharge: 2018-11-12 | DRG: 390 | Disposition: A | Discharge status: Home / Self Care | Payer: Medicare PPO | Attending: Internal Medicine | Admitting: Internal Medicine

## 2018-11-09 ENCOUNTER — Emergency Department (HOSPITAL_COMMUNITY): Payer: Medicare PPO

## 2018-11-09 ENCOUNTER — Other Ambulatory Visit: Payer: Self-pay

## 2018-11-09 DIAGNOSIS — Z808 Family history of malignant neoplasm of other organs or systems: Secondary | ICD-10-CM

## 2018-11-09 DIAGNOSIS — Z7952 Long term (current) use of systemic steroids: Secondary | ICD-10-CM | POA: Diagnosis not present

## 2018-11-09 DIAGNOSIS — F17210 Nicotine dependence, cigarettes, uncomplicated: Secondary | ICD-10-CM | POA: Diagnosis present

## 2018-11-09 DIAGNOSIS — K219 Gastro-esophageal reflux disease without esophagitis: Secondary | ICD-10-CM | POA: Diagnosis present

## 2018-11-09 DIAGNOSIS — D649 Anemia, unspecified: Secondary | ICD-10-CM

## 2018-11-09 DIAGNOSIS — I10 Essential (primary) hypertension: Secondary | ICD-10-CM | POA: Diagnosis present

## 2018-11-09 DIAGNOSIS — Z8249 Family history of ischemic heart disease and other diseases of the circulatory system: Secondary | ICD-10-CM | POA: Diagnosis not present

## 2018-11-09 DIAGNOSIS — M549 Dorsalgia, unspecified: Secondary | ICD-10-CM | POA: Diagnosis present

## 2018-11-09 DIAGNOSIS — K56600 Partial intestinal obstruction, unspecified as to cause: Secondary | ICD-10-CM | POA: Diagnosis present

## 2018-11-09 DIAGNOSIS — Z720 Tobacco use: Secondary | ICD-10-CM

## 2018-11-09 DIAGNOSIS — Z79891 Long term (current) use of opiate analgesic: Secondary | ICD-10-CM

## 2018-11-09 DIAGNOSIS — Z87898 Personal history of other specified conditions: Secondary | ICD-10-CM | POA: Diagnosis not present

## 2018-11-09 DIAGNOSIS — Z833 Family history of diabetes mellitus: Secondary | ICD-10-CM

## 2018-11-09 DIAGNOSIS — R112 Nausea with vomiting, unspecified: Secondary | ICD-10-CM | POA: Diagnosis not present

## 2018-11-09 DIAGNOSIS — K56609 Unspecified intestinal obstruction, unspecified as to partial versus complete obstruction: Secondary | ICD-10-CM

## 2018-11-09 DIAGNOSIS — E039 Hypothyroidism, unspecified: Secondary | ICD-10-CM | POA: Diagnosis present

## 2018-11-09 DIAGNOSIS — G894 Chronic pain syndrome: Secondary | ICD-10-CM | POA: Diagnosis present

## 2018-11-09 DIAGNOSIS — Z888 Allergy status to other drugs, medicaments and biological substances status: Secondary | ICD-10-CM

## 2018-11-09 DIAGNOSIS — F1010 Alcohol abuse, uncomplicated: Secondary | ICD-10-CM | POA: Diagnosis present

## 2018-11-09 DIAGNOSIS — Z79899 Other long term (current) drug therapy: Secondary | ICD-10-CM

## 2018-11-09 DIAGNOSIS — F329 Major depressive disorder, single episode, unspecified: Secondary | ICD-10-CM | POA: Diagnosis present

## 2018-11-09 DIAGNOSIS — F32A Depression, unspecified: Secondary | ICD-10-CM

## 2018-11-09 DIAGNOSIS — B1920 Unspecified viral hepatitis C without hepatic coma: Secondary | ICD-10-CM | POA: Diagnosis present

## 2018-11-09 DIAGNOSIS — K7460 Unspecified cirrhosis of liver: Secondary | ICD-10-CM | POA: Diagnosis present

## 2018-11-09 HISTORY — DX: Unspecified intestinal obstruction, unspecified as to partial versus complete obstruction: K56.609

## 2018-11-09 LAB — URINALYSIS, ROUTINE W REFLEX MICROSCOPIC
Bilirubin Urine: NEGATIVE
Glucose, UA: NEGATIVE mg/dL
Hgb urine dipstick: NEGATIVE
Ketones, ur: NEGATIVE mg/dL
Leukocytes,Ua: NEGATIVE
Nitrite: NEGATIVE
Protein, ur: NEGATIVE mg/dL
Specific Gravity, Urine: 1.011 (ref 1.005–1.030)
pH: 6 (ref 5.0–8.0)

## 2018-11-09 LAB — CBC WITH DIFFERENTIAL/PLATELET
ABS IMMATURE GRANULOCYTES: 0.04 10*3/uL (ref 0.00–0.07)
BASOS PCT: 0 %
Basophils Absolute: 0 10*3/uL (ref 0.0–0.1)
Eosinophils Absolute: 0.1 10*3/uL (ref 0.0–0.5)
Eosinophils Relative: 1 %
HCT: 31.9 % — ABNORMAL LOW (ref 39.0–52.0)
Hemoglobin: 9.8 g/dL — ABNORMAL LOW (ref 13.0–17.0)
IMMATURE GRANULOCYTES: 0 %
LYMPHS PCT: 29 %
Lymphs Abs: 3.2 10*3/uL (ref 0.7–4.0)
MCH: 27.8 pg (ref 26.0–34.0)
MCHC: 30.7 g/dL (ref 30.0–36.0)
MCV: 90.6 fL (ref 80.0–100.0)
MONOS PCT: 8 %
Monocytes Absolute: 0.9 10*3/uL (ref 0.1–1.0)
NEUTROS ABS: 7 10*3/uL (ref 1.7–7.7)
NEUTROS PCT: 62 %
PLATELETS: 428 10*3/uL — AB (ref 150–400)
RBC: 3.52 MIL/uL — AB (ref 4.22–5.81)
RDW: 15.5 % (ref 11.5–15.5)
WBC: 11.3 10*3/uL — AB (ref 4.0–10.5)
nRBC: 0 % (ref 0.0–0.2)

## 2018-11-09 LAB — COMPREHENSIVE METABOLIC PANEL
ALBUMIN: 2.5 g/dL — AB (ref 3.5–5.0)
ALT: 10 U/L (ref 0–44)
AST: 12 U/L — ABNORMAL LOW (ref 15–41)
Alkaline Phosphatase: 60 U/L (ref 38–126)
Anion gap: 8 (ref 5–15)
BILIRUBIN TOTAL: 0.1 mg/dL — AB (ref 0.3–1.2)
BUN: 17 mg/dL (ref 6–20)
CO2: 25 mmol/L (ref 22–32)
Calcium: 8.4 mg/dL — ABNORMAL LOW (ref 8.9–10.3)
Chloride: 99 mmol/L (ref 98–111)
Creatinine, Ser: 1 mg/dL (ref 0.61–1.24)
GFR calc Af Amer: >60 mL/min (ref >60)
GFR calc non Af Amer: >60 mL/min (ref >60)
GLUCOSE: 127 mg/dL — AB (ref 70–99)
POTASSIUM: 4.6 mmol/L (ref 3.5–5.1)
Sodium: 132 mmol/L — ABNORMAL LOW (ref 135–145)
TOTAL PROTEIN: 5.8 g/dL — AB (ref 6.5–8.1)

## 2018-11-09 LAB — PROTIME-INR
INR: 0.88
Prothrombin Time: 11.9 seconds (ref 11.4–15.2)

## 2018-11-09 LAB — CK: Total CK: 77 U/L (ref 49–397)

## 2018-11-09 LAB — RAPID URINE DRUG SCREEN, HOSP PERFORMED
Amphetamines: NOT DETECTED
Barbiturates: NOT DETECTED
Benzodiazepines: NOT DETECTED
Cocaine: NOT DETECTED
Opiates: POSITIVE — AB
Tetrahydrocannabinol: NOT DETECTED

## 2018-11-09 LAB — LIPASE, BLOOD: Lipase: 55 U/L — ABNORMAL HIGH (ref 11–51)

## 2018-11-09 LAB — PHOSPHORUS: Phosphorus: 4.7 mg/dL — ABNORMAL HIGH (ref 2.5–4.6)

## 2018-11-09 LAB — LACTIC ACID, PLASMA
LACTIC ACID, VENOUS: 0.7 mmol/L (ref 0.5–1.9)
Lactic Acid, Venous: 1.3 mmol/L (ref 0.5–1.9)

## 2018-11-09 LAB — ETHANOL: Alcohol, Ethyl (B): <10 mg/dL (ref <10)

## 2018-11-09 LAB — TROPONIN I: Troponin I: <0.03 ng/mL (ref <0.03)

## 2018-11-09 LAB — MAGNESIUM: Magnesium: 1.7 mg/dL (ref 1.7–2.4)

## 2018-11-09 LAB — TSH: TSH: 2.91 u[IU]/mL (ref 0.350–4.500)

## 2018-11-09 MED ORDER — THIAMINE HCL 100 MG/ML IJ SOLN
100.0000 mg | Freq: Every day | INTRAMUSCULAR | Status: DC
  Administered 2018-11-09 – 2018-11-10 (×2): 100 mg via INTRAVENOUS
  Filled 2018-11-09 (×2): qty 2

## 2018-11-09 MED ORDER — ADULT MULTIVITAMIN W/MINERALS CH
1.0000 | ORAL_TABLET | Freq: Every day | ORAL | Status: DC
  Administered 2018-11-11 – 2018-11-12 (×2): 1 via ORAL
  Filled 2018-11-09 (×2): qty 1

## 2018-11-09 MED ORDER — SODIUM CHLORIDE 0.9 % IV SOLN: INTRAVENOUS | Status: DC

## 2018-11-09 MED ORDER — VITAMIN B-1 100 MG PO TABS
100.0000 mg | ORAL_TABLET | Freq: Every day | ORAL | Status: DC
  Administered 2018-11-11 – 2018-11-12 (×2): 100 mg via ORAL
  Filled 2018-11-09 (×2): qty 1

## 2018-11-09 MED ORDER — MORPHINE SULFATE (PF) 4 MG/ML IV SOLN
4.0000 mg | INTRAVENOUS | Status: AC | PRN
  Administered 2018-11-09 (×2): 4 mg via INTRAVENOUS
  Filled 2018-11-09 (×2): qty 1

## 2018-11-09 MED ORDER — FOLIC ACID 1 MG PO TABS
1.0000 mg | ORAL_TABLET | Freq: Every day | ORAL | Status: DC
  Administered 2018-11-11 – 2018-11-12 (×2): 1 mg via ORAL
  Filled 2018-11-09 (×3): qty 1

## 2018-11-09 MED ORDER — NICOTINE 21 MG/24HR TD PT24
21.0000 mg | MEDICATED_PATCH | Freq: Every day | TRANSDERMAL | Status: DC
  Filled 2018-11-09 (×4): qty 1

## 2018-11-09 MED ORDER — ONDANSETRON HCL 4 MG/2ML IJ SOLN: 4.0000 mg | Freq: Four times a day (QID) | INTRAMUSCULAR | Status: DC | PRN

## 2018-11-09 MED ORDER — MORPHINE SULFATE (PF) 2 MG/ML IV SOLN
2.0000 mg | INTRAVENOUS | Status: DC | PRN
  Administered 2018-11-09 – 2018-11-12 (×13): 2 mg via INTRAVENOUS
  Filled 2018-11-09 (×14): qty 1

## 2018-11-09 MED ORDER — POTASSIUM CHLORIDE IN NACL 40-0.9 MEQ/L-% IV SOLN
INTRAVENOUS | Status: DC
  Administered 2018-11-09 – 2018-11-10 (×2): 100 mL/h via INTRAVENOUS
  Filled 2018-11-09 (×6): qty 1000

## 2018-11-09 MED ORDER — ONDANSETRON HCL 4 MG/2ML IJ SOLN
4.0000 mg | INTRAMUSCULAR | Status: DC | PRN
  Administered 2018-11-09: 4 mg via INTRAVENOUS
  Filled 2018-11-09: qty 2

## 2018-11-09 MED ORDER — HEPARIN SODIUM (PORCINE) 5000 UNIT/ML IJ SOLN
5000.0000 [IU] | Freq: Three times a day (TID) | INTRAMUSCULAR | Status: DC
  Administered 2018-11-09 – 2018-11-12 (×9): 5000 [IU] via SUBCUTANEOUS
  Filled 2018-11-09 (×9): qty 1

## 2018-11-09 MED ORDER — MENTHOL 3 MG MT LOZG
1.0000 | LOZENGE | OROMUCOSAL | Status: DC | PRN
  Administered 2018-11-10: 3 mg via ORAL
  Filled 2018-11-09: qty 9

## 2018-11-09 MED ORDER — METOPROLOL TARTRATE 5 MG/5ML IV SOLN
2.5000 mg | Freq: Three times a day (TID) | INTRAVENOUS | Status: DC
  Administered 2018-11-09 – 2018-11-11 (×7): 2.5 mg via INTRAVENOUS
  Filled 2018-11-09 (×8): qty 5

## 2018-11-09 MED ORDER — FENTANYL CITRATE (PF) 100 MCG/2ML IJ SOLN
50.0000 ug | INTRAMUSCULAR | Status: AC | PRN
  Administered 2018-11-09 (×2): 50 ug via INTRAVENOUS
  Filled 2018-11-09 (×2): qty 2

## 2018-11-09 MED ORDER — PANTOPRAZOLE SODIUM 40 MG IV SOLR
40.0000 mg | INTRAVENOUS | Status: DC
  Administered 2018-11-09 – 2018-11-10 (×2): 40 mg via INTRAVENOUS
  Filled 2018-11-09 (×2): qty 40

## 2018-11-09 MED ORDER — LORAZEPAM 1 MG PO TABS
1.0000 mg | ORAL_TABLET | Freq: Four times a day (QID) | ORAL | Status: DC | PRN
  Administered 2018-11-10 (×2): 1 mg via ORAL
  Filled 2018-11-09 (×2): qty 1

## 2018-11-09 MED ORDER — SODIUM CHLORIDE 0.9 % IV SOLN
INTRAVENOUS | Status: DC
  Administered 2018-11-09: 08:00:00 via INTRAVENOUS

## 2018-11-09 MED ORDER — LORAZEPAM 2 MG/ML IJ SOLN
1.0000 mg | Freq: Four times a day (QID) | INTRAMUSCULAR | Status: DC | PRN
  Administered 2018-11-09: 1 mg via INTRAVENOUS
  Filled 2018-11-09: qty 1

## 2018-11-09 NOTE — ED Triage Notes (Signed)
Pt states he was recently at [facility] for distal SBO and had to have dialysis due to rhabdo.  Began having abd pain at 3am and is concerned SBO may have recurred.  Drank alcohol yesterday.

## 2018-11-09 NOTE — ED Provider Notes (Signed)
[facility] EMERGENCY DEPARTMENT Provider Note   CSN: [id number] Arrival date & time: 11/09/18  7829    History   Chief Complaint Chief Complaint  Patient presents with  . Abdominal Pain    HPI [name] is a 53 y.o. male.      Abdominal Pain    Pt was seen at 0745.  Per pt, c/o gradual onset and persistence of constant generalized abd "pain" since yesterday, worse since overnight last night.  Has been associated with multiple intermittent episodes of N/V. Pt states he also had several episodes of diarrhea.  Describes the abd pain as "it might be a bowel obstruction again." Pt states he was discharged from [facility] on 10/25/18 for dx SBO, though discharge summary/medical record indicates "his exam was more consistent with colitis."  Denies fevers, no back pain, no rash, no CP/SOB, no black or blood in stools or emesis.      Past Medical History:  Diagnosis Date  . Acute renal failure (HCC)    Secondary to rhabdo. Treated with dialysis short-term.  . Alcohol abuse    Psychiatric admissions for alcohol and drug abuse  . Back pain, chronic   . Chronic leg pain   . Chronic pain syndrome   . Cirrhosis (HCC)   . Depression    Multiple psychiatric admissions  . Hepatitis C   . History of cocaine abuse (HCC)   . Hyponatremia   . Low TSH level January 2013   Normal free T4 of 1.06  . Myositis    Left deltoid biopsy at [facility] 10/11/11.  . 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 Polysubstance abuse (HCC)   . Rhabdomyolysis   . Small bowel obstruction (HCC)   . Tattoos   . Thrombocytopenia (HCC)   . Tobacco abuse     Patient Active Problem List   Diagnosis Date Noted  . Chronic low back pain 09/22/2015  . Bulging lumbar disc 09/22/2015  . Thrombocytopenia (HCC) 09/22/2015  . Chronic hepatitis C (HCC) 09/22/2015  . Alcohol dependence (HCC) 06/30/2014  . Polysubstance abuse (HCC) 05/02/2013  . Nausea, vomiting and diarrhea 05/02/2013  . Generalized muscle ache 09/06/2012  .  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 Leukocytosis 09/06/2012  . Muscle weakness (generalized) 09/06/2012  . UTI (urinary tract infection) 09/06/2012  . Hyperkalemia 05/28/2012  . Rhabdomyolysis 05/28/2012  . Acute renal failure (HCC) 05/28/2012  . Hyponatremia 05/28/2012  . Elevated liver enzymes 05/28/2012  . Metabolic encephalopathy 12/11/2011  . Overdose 12/11/2011  . Encephalopathy 10/17/2011  . Tachycardia 10/17/2011  . Dehydration 10/17/2011  . Hypernatremia 10/17/2011  . Hepatitis C 10/17/2011  . Elevated LFTs 10/17/2011  . Alcohol abuse 10/17/2011  . Alcohol intoxication (HCC) 10/17/2011  . Muscular disease 10/17/2011  . Chronic pain syndrome 10/17/2011  . BACK PAIN 06/17/2010  . Myalgia and myositis 06/17/2010  . LEG PAIN, RIGHT 06/17/2010  . TOBACCO ABUSE 11/25/2009  . HEPATITIS C 09/18/1996    Past Surgical History:  Procedure Laterality Date  . APPENDECTOMY    . HERNIA REPAIR    . MUSCLE BIOPSY          Home Medications    Prior to Admission medications   Medication Sig Start Date End Date Taking? Authorizing Provider  methylPREDNISolone (MEDROL DOSEPAK) 4 MG TBPK tablet follow package directions 12/14/17   Samuel Jester, DO  oxyCODONE (ROXICODONE) 5 MG immediate release tablet Take 1 tablet (5 mg total) by mouth every 4 (four) hours as needed for severe pain. 02/23/17  Donnetta Hutchingook, Brian, MD  predniSONE (DELTASONE) 10 MG tablet Take 1 tablet (10 mg total) by mouth daily with breakfast. 3 tablets for 3 days, 2 tablets for 3 days, one tablet for 3 days. 02/23/17   Donnetta Hutchingook, Brian, MD    Family History Family History  Problem Relation Age of Onset  . Hypertension Mother   . Cancer Father        throat cancer   . Diabetes Brother     Social History Social History   Tobacco Use  . Smoking status: Current Every Day Smoker    Packs/day: 1.00    Years: 40.00    Pack years: 40.00    Types: Cigarettes  . Smokeless tobacco: Never Used  Substance Use Topics  .  Alcohol use: Yes    Alcohol/week: 100.0 standard drinks    Types: 100 Cans of beer per week    Comment: daily  . Drug use: Yes    Types: Cocaine, Marijuana     Allergies   Acetaminophen   Review of Systems Review of Systems  Gastrointestinal: Positive for abdominal pain.  ROS: Statement: All systems negative except as marked or noted in the HPI; Constitutional: Negative for fever and chills. ; ; Eyes: Negative for eye pain, redness and discharge. ; ; ENMT: Negative for ear pain, hoarseness, nasal congestion, sinus pressure and sore throat. ; ; Cardiovascular: Negative for chest pain, palpitations, diaphoresis, dyspnea and peripheral edema. ; ; Respiratory: Negative for cough, wheezing and stridor. ; ; Gastrointestinal: +abd pain, N/V/D. Negative for blood in stool, hematemesis, jaundice and rectal bleeding. . ; ; Genitourinary: Negative for dysuria, flank pain and hematuria. ; ; Musculoskeletal: Negative for back pain and neck pain. Negative for swelling and trauma.; ; Skin: Negative for pruritus, rash, abrasions, blisters, bruising and skin lesion.; ; Neuro: Negative for headache, lightheadedness and neck stiffness. Negative for weakness, altered level of consciousness, altered mental status, extremity weakness, paresthesias, involuntary movement, seizure and syncope.        Physical Exam Updated Vital Signs BP (!) 168/97 (BP Location: Right Arm)   Pulse (!) 108   Temp 98 F (36.7 C) (Oral)   Resp 20   Ht 5\' 8"  (1.727 m)   Wt 46.7 kg   SpO2 100%   BMI 15.66 kg/m    Physical Exam 0750: Physical examination:  Nursing notes reviewed; Vital signs and O2 SAT reviewed;  Constitutional: Well developed, Well nourished, Uncomfortable appearing.; Head:  Normocephalic, atraumatic; Eyes: EOMI, PERRL, No scleral icterus; ENMT: Mouth and pharynx normal, Mucous membranes dry; Neck: Supple, Full range of motion, No lymphadenopathy; Cardiovascular: Tachycardic rate and rhythm, No gallop;  Respiratory: Breath sounds clear & equal bilaterally, No wheezes.  Speaking full sentences with ease, Normal respiratory effort/excursion; Chest: Nontender, Movement normal; Abdomen: Soft, +diffuse tenderness to palp. No rebound or guarding. Nondistended, Normal bowel sounds; Genitourinary: No CVA tenderness; Extremities: Peripheral pulses normal, No tenderness, No edema, No calf edema or asymmetry.; Neuro: AA&Ox3, Major CN grossly intact.  Speech clear. No gross focal motor or sensory deficits in extremities.; Skin: Color normal, Warm, Dry.    ED Treatments / Results  Labs (all labs ordered are listed, but only abnormal results are displayed)   EKG EKG Interpretation  Date/Time:  Saturday November 09 2018 07:59:20 EST Ventricular Rate:  107 PR Interval:    QRS Duration: 93 QT Interval:  339 QTC Calculation: 453 R Axis:   9 Text Interpretation:  Sinus tachycardia with irregular rate Anterior infarct, old When  compared with ECG of 06/29/2014 Rate faster Confirmed by Samuel Jester [phone]) on 11/09/2018 8:03:01 AM   Radiology   Procedures Procedures (including critical care time)  Medications Ordered in ED Medications  morphine 4 MG/ML injection 4 mg (has no administration in time range)  ondansetron (ZOFRAN) injection 4 mg (has no administration in time range)  0.9 %  sodium chloride infusion (has no administration in time range)     Initial Impression / Assessment and Plan / ED Course  I have reviewed the triage vital signs and the nursing notes.  Pertinent labs & imaging results that were available during my care of the patient were reviewed by me and considered in my medical decision making (see chart for details).     MDM Reviewed: previous chart, nursing note and vitals Reviewed previous: labs and ECG Interpretation: labs, ECG, x-ray and CT scan   Results for orders placed or performed during the hospital encounter of 11/09/18  Comprehensive metabolic panel    Result Value Ref Range   Sodium 132 (L) 135 - 145 mmol/L   Potassium 4.6 3.5 - 5.1 mmol/L   Chloride 99 98 - 111 mmol/L   CO2 25 22 - 32 mmol/L   Glucose, Bld 127 (H) 70 - 99 mg/dL   BUN 17 6 - 20 mg/dL   Creatinine, Ser 8.29 0.61 - 1.24 mg/dL   Calcium 8.4 (L) 8.9 - 10.3 mg/dL   Total Protein 5.8 (L) 6.5 - 8.1 g/dL   Albumin 2.5 (L) 3.5 - 5.0 g/dL   AST 12 (L) 15 - 41 U/L   ALT 10 0 - 44 U/L   Alkaline Phosphatase 60 38 - 126 U/L   Total Bilirubin 0.1 (L) 0.3 - 1.2 mg/dL   GFR calc non Af Amer >60 >60 mL/min   GFR calc Af Amer >60 >60 mL/min   Anion gap 8 5 - 15  Lipase, blood  Result Value Ref Range   Lipase 55 (H) 11 - 51 U/L  Troponin I - Once  Result Value Ref Range   Troponin I <0.03 <0.03 ng/mL  Lactic acid, plasma  Result Value Ref Range   Lactic Acid, Venous 1.3 0.5 - 1.9 mmol/L  CBC with Differential  Result Value Ref Range   WBC 11.3 (H) 4.0 - 10.5 K/uL   RBC 3.52 (L) 4.22 - 5.81 MIL/uL   Hemoglobin 9.8 (L) 13.0 - 17.0 g/dL   HCT 56.2 (L) 13.0 - 86.5 %   MCV 90.6 80.0 - 100.0 fL   MCH 27.8 26.0 - 34.0 pg   MCHC 30.7 30.0 - 36.0 g/dL   RDW 78.4 69.6 - 29.5 %   Platelets 428 (H) 150 - 400 K/uL   nRBC 0.0 0.0 - 0.2 %   Neutrophils Relative % 62 %   Neutro Abs 7.0 1.7 - 7.7 K/uL   Lymphocytes Relative 29 %   Lymphs Abs 3.2 0.7 - 4.0 K/uL   Monocytes Relative 8 %   Monocytes Absolute 0.9 0.1 - 1.0 K/uL   Eosinophils Relative 1 %   Eosinophils Absolute 0.1 0.0 - 0.5 K/uL   Basophils Relative 0 %   Basophils Absolute 0.0 0.0 - 0.1 K/uL   Immature Granulocytes 0 %   Abs Immature Granulocytes 0.04 0.00 - 0.07 K/uL  Protime-INR  Result Value Ref Range   Prothrombin Time 11.9 11.4 - 15.2 seconds   INR 0.88   CK  Result Value Ref Range   Total CK 77 49 -  397 U/L  Ethanol  Result Value Ref Range   Alcohol, Ethyl (B) <10 <10 mg/dL   Ct Abdomen Pelvis Wo Contrast Result Date: 11/09/2018 CLINICAL DATA:  53 year old male with acute abdominal and pelvic pain  for 1 day. Reported history of cirrhosis. EXAM: CT ABDOMEN AND PELVIS WITHOUT CONTRAST TECHNIQUE: Multidetector CT imaging of the abdomen and pelvis was performed following the standard protocol without IV contrast. COMPARISON:  09/21/2018 and prior CTs FINDINGS: Please note that parenchymal abnormalities may be missed without intravenous contrast. Lower chest: No acute abnormalities. Hepatobiliary: No hepatic or gallbladder abnormalities. No biliary dilatation. Pancreas: Unremarkable Spleen: UPPER limits normal spleen size noted. Adrenals/Urinary Tract: The kidneys, adrenal glands and bladder are unremarkable. Stomach/Bowel: Dilated proximal and mid small bowel loops are identified with tapered transition point noted within the mid small bowel in the pelvis, likely representing a high-grade small bowel obstruction. A trace amount of free pelvic fluid is noted. There is no evidence of pneumoperitoneum. Stool and gas in the colon noted. Equivocal wall thickening of the descending colon could represent a colitis. Vascular/Lymphatic: Aortic atherosclerosis. No enlarged abdominal or pelvic lymph nodes. Reproductive: Prostate is unremarkable. Other: None Musculoskeletal: No acute or suspicious abnormalities. Multiple healing fractures of posterior LOWER LEFT ribs and lumbar spine LEFT transverse processes noted. Moderate degenerative disc disease/spondylosis at L5-S1 noted. IMPRESSION: 1. Dilated proximal mid small bowel loops with taper transition point within the pelvis, likely representing high-grade small bowel obstruction. Trace amount of free pelvic fluid. No pneumoperitoneum. 2. Equivocal wall thickening of the descending colon/colitis. 3. Multiple healing fractures of LEFT ribs and LEFT lumbar spine transverse processes. 4.  Aortic Atherosclerosis (ICD10-I70.0). Electronically Signed   By: Harmon Pier M.D.   On: 11/09/2018 09:10   Dg Chest 2 View Result Date: 11/09/2018 CLINICAL DATA:  Abdominal pain with  nausea and vomiting. EXAM: CHEST - 2 VIEW COMPARISON:  Chest x-ray dated September 06, 2012. FINDINGS: The heart size and mediastinal contours are within normal limits. Both lungs are clear. The visualized skeletal structures are unremarkable. IMPRESSION: No active cardiopulmonary disease. Electronically Signed   By: Obie Dredge M.D.   On: 11/09/2018 08:55     Care Everywhere Result Report CBCResulted: 10/22/2018 7:51 AM [facility] Component Name Value Ref Range  WBC 8.2 4.6 - 10.2 x 10*3/uL  RBC 2.45 (L) 4.69 - 6.13 x 10*6/uL  Hemoglobin 7.1 (L) 14.1 - 18.1 G/DL  Hematocrit 02.7 (L) 25.3 - 53.7 %  MCV 86.2 80 - 97 FL  MCH 29.0 27 - 31.2 PG  MCHC 33.6 31.8 - 35.4 G/DL  RDW 66.4 40.3 - 47.4 %  Platelets 341 142 - 424 X 10*3/uL  MPV 6.8 (L) 7.8 - 11 FL  Specimen Collected on  Blood 10/22/2018 6:58 AM    0950:  Pt states he has only drank a small amount of beer yesterday since being discharged from [facility]. Pt does not appear outwardly tremulous and seems to be mentating appropriately; CIWA assessment ordered. CT as above. Pt continues to c/o abd pain, nausea. Will re-medicate, place NGT. H/H today is per previous at [facility] this month.  T/C returned from [facility] Dr. Lovell Sheehan, case discussed, including:  HPI, pertinent PM/SHx, VS/PE, dx testing, ED course and treatment:  Agreeable to consult.   1015: Dx and testing d/w pt.  Questions answered.  Verb understanding, agreeable to admit.  T/C returned from [facility] Dr. Gwenlyn Perking, case discussed, including:  HPI, pertinent PM/SHx, VS/PE, dx testing, ED course  and treatment:  Agreeable to admit.      Final Clinical Impressions(s) / ED Diagnoses   Final diagnoses:  None    ED Discharge Orders    None       Samuel Jester, DO 11/13/18 0962

## 2018-11-09 NOTE — H&P (Addendum)
History and Physical    [name] CBU:[id number] DOB: 02-Aug-1966 DOA: 11/09/2018  Referring MD/NP/PA: Dr. Clarene Duke PCP: Patient, No Pcp Per  Patient coming from: Home  Chief Complaint: abdominal pain, nausea and vomiting.  HPI: [name] is a 53 y.o. male with PMH of prior SBO, Renal failure (due to rhabdomyolysis), hx of cirrhosis, hepatitis C, polysubstance abuse, hypothyroidsm, HTH, depression and chronic pain; who came to the ED due to a gradual, constant, diffuse abdominal pain that started 48 hours prior to admission. Pain is worsening, non radiated, 8/10 in intensity and associated with multiple episodes of nausea, vomiting and loose stools. Denies recent travel, fevers, hemoptysis, hematuria, melena or hematochezia. Patient states he was discharge on 10/25/18 from [facility] with a diagnosis of SBO but discharge summary indicates colitis and didn't;t require surgical intervention..   CT of abdomen and pelvis reported dilated proximal mid small bowel loops suggesting a high-grade small bowel obstruction and wall thickening of the descending colon. Multiple healing fractures of left ribs and left lumbar spine transverse processes were also noted.  In the emergency department patient received fluid resuscitation, pain medication, PRN antiemetics and had placement of NG tube.  General surgery was consulted and TRH called to place patient in the hospital for further evaluation and management.  Past Medical/Surgical History: Past Medical History:  Diagnosis Date  . Acute renal failure (HCC)    Secondary to rhabdo. Treated with dialysis short-term.  . Alcohol abuse    Psychiatric admissions for alcohol and drug abuse  . Back pain, chronic   . Chronic leg pain   . Chronic pain syndrome   . Cirrhosis (HCC)   . Depression    Multiple psychiatric admissions  . Hepatitis C   . History of cocaine abuse (HCC)   . Hyponatremia   . Low TSH level January 2013   Normal free T4 of 1.06  .  Myositis    Left deltoid biopsy at [facility] 10/11/11.  . 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 Polysubstance abuse (HCC)   . Rhabdomyolysis   . Small bowel obstruction (HCC)   . Tattoos   . Thrombocytopenia (HCC)   . Tobacco abuse     Past Surgical History:  Procedure Laterality Date  . APPENDECTOMY    . HERNIA REPAIR    . MUSCLE BIOPSY      Social History:  reports that he has been smoking cigarettes. He has a 40.00 pack-year smoking history. He has never used smokeless tobacco. He reports current alcohol use of about 100.0 standard drinks of alcohol per week. He reports current drug use. Drugs: Cocaine and Marijuana.  Allergies: Allergies  Allergen Reactions  . Acetaminophen Other (See Comments)    Liver disease     Family History:  Family History  Problem Relation Age of Onset  . Hypertension Mother   . Cancer Father        throat cancer   . Diabetes Brother     Prior to Admission medications   Medication Sig Start Date End Date Taking? Authorizing Provider  diphenhydramine-acetaminophen (TYLENOL PM) 25-500 MG TABS tablet Take 1 tablet by mouth at bedtime as needed for pain.   Yes [provider]  Multiple Vitamin (MULTIVITAMIN WITH MINERALS) TABS tablet Take 1 tablet by mouth daily.   Yes [provider]    Review of Systems:  Constitutional: Denies fever, chills and diaphoresis. HEENT: Denies photophobia, eye pain, redness, hearing loss, ear pain, congestion, sore throat, rhinorrhea, sneezing, mouth sores,  trouble swallowing, neck pain, neck stiffness and tinnitus.   Respiratory: Denies SOB, DOE, cough, chest tightness,  and wheezing.   Cardiovascular: Denies chest pain, palpitations and leg swelling.  Constipation, blood in stool and abdominal distention.  Genitourinary: Denies dysuria, urgency, frequency, hematuria, flank pain and difficulty urinating.  Endocrine: Denies: hot or cold intolerance, sweats, changes in hair or nails, polyuria,  polydipsia. Musculoskeletal: Denies myalgias, back pain, joint swelling, arthralgias and gait problem.  Skin: Denies pallor, rash and wound.  Neurological: Denies dizziness, seizures, syncope, weakness, light-headedness, numbness and headaches.  Hematological: Denies adenopathy. Easy bruising, personal or family bleeding history  Psychiatric/Behavioral: Denies suicidal ideation, mood changes, confusion, nervousness, sleep disturbance and agitation  Physical Exam: Vitals:   11/09/18 0733 11/09/18 0735  BP:  (!) 168/97  Pulse:  (!) 108  Resp:  20  Temp:  98 F (36.7 C)  TempSrc:  Oral  SpO2:  100%  Weight: 46.7 kg   Height: 5\' 8"  (1.727 m)     Constitutional: NAD, slightly somnolent after pain medication.  NG tube in place, still expressing abdominal discomfort. Eyes: PERRL, lids and conjunctivae normal, no icterus, no nystagmus. ENMT: Mucous membranes are moist. Posterior pharynx clear of any exudate or lesions. Neck: normal, supple, no masses, no thyromegaly, no JVD Respiratory: clear to auscultation bilaterally, no wheezing, no crackles. Normal respiratory effort. No accessory muscle use.  Cardiovascular: Slightly tachycardic, no rubs, no gallops, no murmurs.    Abdomen: abdominal pain diffuse, but mostly over left side, no masses palpated. No hepatosplenomegaly. Bowel sounds decreased.  Musculoskeletal: no clubbing / cyanosis. No joint deformity upper and lower extremities. Good ROM, no contractures. Normal muscle tone.  Skin: no rashes, lesions, ulcers. No induration Neurologic exam: No focal deficit, moving 4 limbs spontaneously, following commands appropriately.  Oriented x3.  Labs on Admission: I have personally reviewed the following labs and imaging studies  CBC: Recent Labs  Lab 11/09/18 0810  WBC 11.3*  NEUTROABS 7.0  HGB 9.8*  HCT 31.9*  MCV 90.6  PLT 428*   Basic Metabolic Panel: Recent Labs  Lab 11/09/18 0810  NA 132*  K 4.6  CL 99  CO2 25  GLUCOSE  127*  BUN 17  CREATININE 1.00  CALCIUM 8.4*   GFR: Estimated Creatinine Clearance: 57.7 mL/min (by C-G formula based on SCr of 1 mg/dL).   Liver Function Tests: Recent Labs  Lab 11/09/18 0810  AST 12*  ALT 10  ALKPHOS 60  BILITOT 0.1*  PROT 5.8*  ALBUMIN 2.5*   Recent Labs  Lab 11/09/18 0810  LIPASE 55*   Coagulation Profile: Recent Labs  Lab 11/09/18 0810  INR 0.88   Cardiac Enzymes: Recent Labs  Lab 11/09/18 0810  CKTOTAL 77  TROPONINI <0.03   Urine analysis:    Component Value Date/Time   COLORURINE YELLOW 11/09/2018 0752   APPEARANCEUR CLEAR 11/09/2018 0752   LABSPEC 1.011 11/09/2018 0752   PHURINE 6.0 11/09/2018 0752   GLUCOSEU NEGATIVE 11/09/2018 0752   HGBUR NEGATIVE 11/09/2018 0752   BILIRUBINUR NEGATIVE 11/09/2018 0752   KETONESUR NEGATIVE 11/09/2018 0752   PROTEINUR NEGATIVE 11/09/2018 0752   UROBILINOGEN 0.2 07/24/2014 1725   NITRITE NEGATIVE 11/09/2018 0752   LEUKOCYTESUR NEGATIVE 11/09/2018 0752    Radiological Exams on Admission: Ct Abdomen Pelvis Wo Contrast  Result Date: 11/09/2018 CLINICAL DATA:  53 year old male with acute abdominal and pelvic pain for 1 day. Reported history of cirrhosis. EXAM: CT ABDOMEN AND PELVIS WITHOUT CONTRAST TECHNIQUE: Multidetector CT imaging of  the abdomen and pelvis was performed following the standard protocol without IV contrast. COMPARISON:  09/21/2018 and prior CTs FINDINGS: Please note that parenchymal abnormalities may be missed without intravenous contrast. Lower chest: No acute abnormalities. Hepatobiliary: No hepatic or gallbladder abnormalities. No biliary dilatation. Pancreas: Unremarkable Spleen: UPPER limits normal spleen size noted. Adrenals/Urinary Tract: The kidneys, adrenal glands and bladder are unremarkable. Stomach/Bowel: Dilated proximal and mid small bowel loops are identified with tapered transition point noted within the mid small bowel in the pelvis, likely representing a high-grade small  bowel obstruction. A trace amount of free pelvic fluid is noted. There is no evidence of pneumoperitoneum. Stool and gas in the colon noted. Equivocal wall thickening of the descending colon could represent a colitis. Vascular/Lymphatic: Aortic atherosclerosis. No enlarged abdominal or pelvic lymph nodes. Reproductive: Prostate is unremarkable. Other: None Musculoskeletal: No acute or suspicious abnormalities. Multiple healing fractures of posterior LOWER LEFT ribs and lumbar spine LEFT transverse processes noted. Moderate degenerative disc disease/spondylosis at L5-S1 noted. IMPRESSION: 1. Dilated proximal mid small bowel loops with taper transition point within the pelvis, likely representing high-grade small bowel obstruction. Trace amount of free pelvic fluid. No pneumoperitoneum. 2. Equivocal wall thickening of the descending colon/colitis. 3. Multiple healing fractures of LEFT ribs and LEFT lumbar spine transverse processes. 4.  Aortic Atherosclerosis (ICD10-I70.0). Electronically Signed   By: Harmon Pier M.D.   On: 11/09/2018 09:10   Dg Chest 2 View  Result Date: 11/09/2018 CLINICAL DATA:  Abdominal pain with nausea and vomiting. EXAM: CHEST - 2 VIEW COMPARISON:  Chest x-ray dated September 06, 2012. FINDINGS: The heart size and mediastinal contours are within normal limits. Both lungs are clear. The visualized skeletal structures are unremarkable. IMPRESSION: No active cardiopulmonary disease. Electronically Signed   By: Obie Dredge M.D.   On: 11/09/2018 08:55    EKG: Independently reviewed.  Normal QT, sinus tachycardia, no acute ischemic changes.  Assessment/Plan 1-SBO/partial SBO: -Recurrent -NG tube in place -Provide fluid resuscitation and electrolyte repletion -General surgery is consulted, will follow recommendations. -Provide as needed analgesics and antiemetics. -Follow clinical response  2-hypothyroidism -Will check TSH -Not taking any medication for his thyroid prior to  admission.  3-prior history acute Renal Failure: In the setting of rhabdomyolysis. -Normal CK -Normal renal function coronary -Will monitor trend.  4-Cirrhosis/Hepatitis C -will resume the use of lactulose when able to take by mouth. -Patient has been encouraged to quit drinking.  5-Polysubstance abuse -including tobacco abuse, alcohol abuse and history of cocaine/marijuana. -UDS positive only for opiates at this time -Cessation counseling has been provided. -Nicotine patch has been ordered -CIWA protocol in place; started on thiamine and folic acid.  6-Depression -overall mood is a stable -No suicidal ideation or hallucination -Will resume Remeron when able to tolerate by mouth.  7-Chronic Pain -Continue as needed analgesics -Will be judicious with the use of narcotics. -Patient is currently n.p.o.  8-GERD -Will use IV PPI.  9-HTN -holding all oral meds -will use IV lopressor.  DVT prophylaxis: Heparin Code Status: Full Family Communication: No family members at bedside Disposition Plan: Anticipate discharge back home once medically stable. Consults called: General Surgery  Admission status: Inpatient, length of stay more than 2 midnights, MedSurg.   Time Spent: 70 minutes  Vassie Loll MD [facility] Pager [phone]  11/09/2018, 10:36 AM

## 2018-11-09 NOTE — Consult Note (Signed)
Reason for Consult: Small bowel obstruction Referring Physician: Dr. Marinell Blight is an 53 y.o. male.  HPI: Patient is a 53 year old white male with multiple medical problems including cirrhosis, psychiatric admissions, acute renal failure secondary to rhabdomyolysis, polysubstance abuse who was recently discharged from [facility] for a small bowel obstruction and the need for dialysis.  He is also recently undergone detox.  He states that he began having abdominal pain this morning.  He did have a bowel movement earlier today and yesterday evening.  He did drink alcohol recently.  He denies any cocaine use.  He feels it is similar to his previous episode of bowel obstruction.  He was treated with an NG tube and conservative therapy.  He did not require surgery.  He has had multiple surgeries in the past.  He currently has a pain level of 4 out of 10.  This is mostly due to his NG tube.  Past Medical History:  Diagnosis Date  . Acute renal failure (HCC)    Secondary to rhabdo. Treated with dialysis short-term.  . Alcohol abuse    Psychiatric admissions for alcohol and drug abuse  . Back pain, chronic   . Chronic leg pain   . Chronic pain syndrome   . Cirrhosis (HCC)   . Depression    Multiple psychiatric admissions  . Hepatitis C   . History of cocaine abuse (HCC)   . Hyponatremia   . Low TSH level January 2013   Normal free T4 of 1.06  . Myositis    Left deltoid biopsy at [facility] 10/11/11.  . 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 Polysubstance abuse (HCC)   . Rhabdomyolysis   . Small bowel obstruction (HCC)   . Tattoos   . Thrombocytopenia (HCC)   . Tobacco abuse     Past Surgical History:  Procedure Laterality Date  . APPENDECTOMY    . HERNIA REPAIR    . MUSCLE BIOPSY      Family History  Problem Relation Age of Onset  . Hypertension Mother   . Cancer Father        throat cancer   . Diabetes Brother     Social History:  reports that he has  been smoking cigarettes. He has a 40.00 pack-year smoking history. He has never used smokeless tobacco. He reports current alcohol use of about 100.0 standard drinks of alcohol per week. He reports current drug use. Drugs: Cocaine and Marijuana.  Allergies:  Allergies  Allergen Reactions  . Acetaminophen Other (See Comments)    Liver disease     Medications: I have reviewed the patient's current medications.  Results for orders placed or performed during the hospital encounter of 11/09/18 (from the past 48 hour(s))  Urinalysis, Routine w reflex microscopic     Status: None   Collection Time: 11/09/18  7:52 AM  Result Value Ref Range   Color, Urine YELLOW YELLOW   APPearance CLEAR CLEAR   Specific Gravity, Urine 1.011 1.005 - 1.030   pH 6.0 5.0 - 8.0   Glucose, UA NEGATIVE NEGATIVE mg/dL   Hgb urine dipstick NEGATIVE NEGATIVE   Bilirubin Urine NEGATIVE NEGATIVE   Ketones, ur NEGATIVE NEGATIVE mg/dL   Protein, ur NEGATIVE NEGATIVE mg/dL   Nitrite NEGATIVE NEGATIVE   Leukocytes,Ua NEGATIVE NEGATIVE    Comment: Performed at [facility], [address] [place], [place] [address]  Urine rapid drug screen (hosp performed)     Status: Abnormal  Collection Time: 11/09/18  7:58 AM  Result Value Ref Range   Opiates POSITIVE (A) NONE DETECTED   Cocaine NONE DETECTED NONE DETECTED   Benzodiazepines NONE DETECTED NONE DETECTED   Amphetamines NONE DETECTED NONE DETECTED   Tetrahydrocannabinol NONE DETECTED NONE DETECTED   Barbiturates NONE DETECTED NONE DETECTED    Comment: (NOTE) DRUG SCREEN FOR MEDICAL PURPOSES ONLY.  IF CONFIRMATION IS NEEDED FOR ANY PURPOSE, NOTIFY LAB WITHIN 5 DAYS. LOWEST DETECTABLE LIMITS FOR URINE DRUG SCREEN Drug Class                     Cutoff (ng/mL) Amphetamine and metabolites    1000 Barbiturate and metabolites    200 Benzodiazepine                 200 Tricyclics and metabolites     300 Opiates and metabolites        300 Cocaine and metabolites         300 THC                            50 Performed at [facility], [address] [place], [place] [address]   Comprehensive metabolic panel     Status: Abnormal   Collection Time: 11/09/18  8:10 AM  Result Value Ref Range   Sodium 132 (L) 135 - 145 mmol/L   Potassium 4.6 3.5 - 5.1 mmol/L   Chloride 99 98 - 111 mmol/L   CO2 25 22 - 32 mmol/L   Glucose, Bld 127 (H) 70 - 99 mg/dL   BUN 17 6 - 20 mg/dL   Creatinine, Ser 6.21 0.61 - 1.24 mg/dL   Calcium 8.4 (L) 8.9 - 10.3 mg/dL   Total Protein 5.8 (L) 6.5 - 8.1 g/dL   Albumin 2.5 (L) 3.5 - 5.0 g/dL   AST 12 (L) 15 - 41 U/L   ALT 10 0 - 44 U/L   Alkaline Phosphatase 60 38 - 126 U/L   Total Bilirubin 0.1 (L) 0.3 - 1.2 mg/dL   GFR calc non Af Amer >60 >60 mL/min   GFR calc Af Amer >60 >60 mL/min   Anion gap 8 5 - 15    Comment: Performed at [facility], [address], [place], [place] [address]  Lipase, blood     Status: Abnormal   Collection Time: 11/09/18  8:10 AM  Result Value Ref Range   Lipase 55 (H) 11 - 51 U/L    Comment: Performed at [facility], [address], [place], [place] [address]  Troponin I - Once     Status: None   Collection Time: 11/09/18  8:10 AM  Result Value Ref Range   Troponin I <0.03 <0.03 ng/mL    Comment: Performed at [facility], [address] [place], [place] [address]  Lactic acid, plasma     Status: None   Collection Time: 11/09/18  8:10 AM  Result Value Ref Range   Lactic Acid, Venous 1.3 0.5 - 1.9 mmol/L    Comment: Performed at [facility], [address], [place], [place] [address]  CBC with Differential     Status: Abnormal   Collection Time: 11/09/18  8:10 AM  Result Value Ref Range   WBC 11.3 (H) 4.0 - 10.5 K/uL   RBC 3.52 (L) 4.22 - 5.81 MIL/uL   Hemoglobin 9.8 (L) 13.0 - 17.0 g/dL   HCT 44.0 (L) 10.2 - 72.5 %  MCV 90.6 80.0 - 100.0 fL   MCH 27.8 26.0 - 34.0 pg   MCHC 30.7 30.0 - 36.0 g/dL   RDW 16.115.5 09.611.5 - 04.515.5 %   Platelets 428 (H) 150 - 400 K/uL   nRBC 0.0 0.0 - 0.2 %    Neutrophils Relative % 62 %   Neutro Abs 7.0 1.7 - 7.7 K/uL   Lymphocytes Relative 29 %   Lymphs Abs 3.2 0.7 - 4.0 K/uL   Monocytes Relative 8 %   Monocytes Absolute 0.9 0.1 - 1.0 K/uL   Eosinophils Relative 1 %   Eosinophils Absolute 0.1 0.0 - 0.5 K/uL   Basophils Relative 0 %   Basophils Absolute 0.0 0.0 - 0.1 K/uL   Immature Granulocytes 0 %   Abs Immature Granulocytes 0.04 0.00 - 0.07 K/uL    Comment: Performed at [facility], [address] [place], [place] [address]  Protime-INR     Status: None   Collection Time: 11/09/18  8:10 AM  Result Value Ref Range   Prothrombin Time 11.9 11.4 - 15.2 seconds   INR 0.88     Comment: Performed at [facility], [address] [place], [place] [address]  CK     Status: None   Collection Time: 11/09/18  8:10 AM  Result Value Ref Range   Total CK 77 49 - 397 U/L    Comment: Performed at [facility], [address] [place], [place] [address]  Ethanol     Status: None   Collection Time: 11/09/18  8:10 AM  Result Value Ref Range   Alcohol, Ethyl (B) <10 <10 mg/dL    Comment: Performed at [facility], [address] [place], [place] [address]  Magnesium     Status: None   Collection Time: 11/09/18  8:10 AM  Result Value Ref Range   Magnesium 1.7 1.7 - 2.4 mg/dL    Comment: Performed at [facility], [address] [place], [place] [address]  Phosphorus     Status: Abnormal   Collection Time: 11/09/18  8:10 AM  Result Value Ref Range   Phosphorus 4.7 (H) 2.5 - 4.6 mg/dL    Comment: Performed at [facility], [address] [place], [place] [address]  Lactic acid, plasma     Status: None   Collection Time: 11/09/18 10:15 AM  Result Value Ref Range   Lactic Acid, Venous 0.7 0.5 - 1.9 mmol/L    Comment: Performed at [facility], [address] [place], [place] [address]    Ct Abdomen Pelvis Wo Contrast  Result Date: 11/09/2018 CLINICAL DATA:  53 year old male with acute abdominal and pelvic pain for 1 day. Reported history of  cirrhosis. EXAM: CT ABDOMEN AND PELVIS WITHOUT CONTRAST TECHNIQUE: Multidetector CT imaging of the abdomen and pelvis was performed following the standard protocol without IV contrast. COMPARISON:  09/21/2018 and prior CTs FINDINGS: Please note that parenchymal abnormalities may be missed without intravenous contrast. Lower chest: No acute abnormalities. Hepatobiliary: No hepatic or gallbladder abnormalities. No biliary dilatation. Pancreas: Unremarkable Spleen: UPPER limits normal spleen size noted. Adrenals/Urinary Tract: The kidneys, adrenal glands and bladder are unremarkable. Stomach/Bowel: Dilated proximal and mid small bowel loops are identified with tapered transition point noted within the mid small bowel in the pelvis, likely representing a high-grade small bowel obstruction. A trace amount of free pelvic fluid is noted. There is no evidence of pneumoperitoneum. Stool and gas in the colon noted. Equivocal wall thickening of the descending colon could represent a colitis.  Vascular/Lymphatic: Aortic atherosclerosis. No enlarged abdominal or pelvic lymph nodes. Reproductive: Prostate is unremarkable. Other: None Musculoskeletal: No acute or suspicious abnormalities. Multiple healing fractures of posterior LOWER LEFT ribs and lumbar spine LEFT transverse processes noted. Moderate degenerative disc disease/spondylosis at L5-S1 noted. IMPRESSION: 1. Dilated proximal mid small bowel loops with taper transition point within the pelvis, likely representing high-grade small bowel obstruction. Trace amount of free pelvic fluid. No pneumoperitoneum. 2. Equivocal wall thickening of the descending colon/colitis. 3. Multiple healing fractures of LEFT ribs and LEFT lumbar spine transverse processes. 4.  Aortic Atherosclerosis (ICD10-I70.0). Electronically Signed   By: Harmon Pier M.D.   On: 11/09/2018 09:10   Dg Chest 2 View  Result Date: 11/09/2018 CLINICAL DATA:  Abdominal pain with nausea and vomiting. EXAM: CHEST  - 2 VIEW COMPARISON:  Chest x-ray dated September 06, 2012. FINDINGS: The heart size and mediastinal contours are within normal limits. Both lungs are clear. The visualized skeletal structures are unremarkable. IMPRESSION: No active cardiopulmonary disease. Electronically Signed   By: Obie Dredge M.D.   On: 11/09/2018 08:55    ROS:  Pertinent items are noted in HPI.  Blood pressure (!) 168/97, pulse (!) 108, temperature 98 F (36.7 C), temperature source Oral, resp. rate 20, height  (1.727 m), weight 46.7 kg, SpO2 100 %. Physical Exam: Well-developed well-nourished white male in no acute distress. Head is normocephalic, atraumatic Lungs clear to auscultation with good breath sounds bilaterally Heart examination reveals regular rate and rhythm without S3, S4, murmurs Abdomen is soft and flat.  Occasional bowel sounds appreciated.  No rigidity is noted.Point tenderness noted.  Multiple surgical scars present.  CT scan images personally reviewed  Assessment/Plan: Impression: Partial small bowel obstruction, recurrent.  No need for acute surgical intervention at this time. Plan: Would continue NG tube decompression for now.  Further management is pending those results.  Franky Macho 11/09/2018, 11:08 AM

## 2018-11-10 LAB — GLUCOSE, CAPILLARY: GLUCOSE-CAPILLARY: 91 mg/dL (ref 70–99)

## 2018-11-10 LAB — URINE CULTURE: CULTURE: NO GROWTH

## 2018-11-10 MED ORDER — BISACODYL 10 MG RE SUPP
10.0000 mg | Freq: Once | RECTAL | Status: AC
  Administered 2018-11-10: 10 mg via RECTAL
  Filled 2018-11-10: qty 1

## 2018-11-10 MED ORDER — PHENOL 1.4 % MT LIQD
1.0000 | OROMUCOSAL | Status: DC | PRN
  Administered 2018-11-10: 1 via OROMUCOSAL
  Filled 2018-11-10: qty 177

## 2018-11-10 NOTE — Progress Notes (Addendum)
Pt c/o being hungry. Pt educated on need for NG tube. Pt reminded of same conversation with night shift RN per report. Pt stated "Go the fuck away". Pt educated that cussing at the staff will not be tolerated. Pt stated "I'm leaving". Pt educated that he is able to leave AMA. Pt educated that RN will page MD and  Notify of his choice. Pt provided with AMA paper. Pt states "Did I tell you I was leaving?" Pt educated that he stated he wanted to go and he wanted food. Pt educated he was welcome to remain in the hospital and get treatment per his MD plan of care, however, he needs to follow the rules. Pt stated "Get the fuck out of my room". Pt educated not to cuss at [employer]. MD paged via Loretha Stapler and Epic Chat. Awaiting callback.   Addendum: MD stated to order pt chloroseptic spray and inform pt MD would be back to see him shortly. Notified pt of same. Pt agrees to stay until he see's MD.

## 2018-11-10 NOTE — Progress Notes (Signed)
Subjective: Patient states he is passing flatus.  No bowel movement yet.  Is hungry.  Objective: Vital signs in last 24 hours: Temp:  [98.6 F (37 C)-99.1 F (37.3 C)] 98.6 F (37 C) (02/23 0128) Pulse Rate:  [99-111] 106 (02/23 0128) Resp:  [13-19] 19 (02/23 0128) BP: (102-150)/(76-97) 114/81 (02/23 0128) SpO2:  [94 %-99 %] 98 % (02/23 0128) Weight:  [54.3 kg] 54.3 kg (02/22 1333) Last BM Date: 11/08/18  Intake/Output from previous day: 02/22 0701 - 02/23 0700 In: 2019.7 [I.V.:2019.7] Out: 900 [Urine:900] Intake/Output this shift: Total I/O In: -  Out: 700 [Urine:700]  General appearance: alert, cooperative and no distress GI: Soft, flat, active bowel sounds appreciated.  No tenderness noted.  Lab Results:  Recent Labs    11/09/18 0810  WBC 11.3*  HGB 9.8*  HCT 31.9*  PLT 428*   BMET Recent Labs    11/09/18 0810  NA 132*  K 4.6  CL 99  CO2 25  GLUCOSE 127*  BUN 17  CREATININE 1.00  CALCIUM 8.4*   PT/INR Recent Labs    11/09/18 0810  LABPROT 11.9  INR 0.88    Studies/Results: Ct Abdomen Pelvis Wo Contrast  Result Date: 11/09/2018 CLINICAL DATA:  53 year old male with acute abdominal and pelvic pain for 1 day. Reported history of cirrhosis. EXAM: CT ABDOMEN AND PELVIS WITHOUT CONTRAST TECHNIQUE: Multidetector CT imaging of the abdomen and pelvis was performed following the standard protocol without IV contrast. COMPARISON:  09/21/2018 and prior CTs FINDINGS: Please note that parenchymal abnormalities may be missed without intravenous contrast. Lower chest: No acute abnormalities. Hepatobiliary: No hepatic or gallbladder abnormalities. No biliary dilatation. Pancreas: Unremarkable Spleen: UPPER limits normal spleen size noted. Adrenals/Urinary Tract: The kidneys, adrenal glands and bladder are unremarkable. Stomach/Bowel: Dilated proximal and mid small bowel loops are identified with tapered transition point noted within the mid small bowel in the  pelvis, likely representing a high-grade small bowel obstruction. A trace amount of free pelvic fluid is noted. There is no evidence of pneumoperitoneum. Stool and gas in the colon noted. Equivocal wall thickening of the descending colon could represent a colitis. Vascular/Lymphatic: Aortic atherosclerosis. No enlarged abdominal or pelvic lymph nodes. Reproductive: Prostate is unremarkable. Other: None Musculoskeletal: No acute or suspicious abnormalities. Multiple healing fractures of posterior LOWER LEFT ribs and lumbar spine LEFT transverse processes noted. Moderate degenerative disc disease/spondylosis at L5-S1 noted. IMPRESSION: 1. Dilated proximal mid small bowel loops with taper transition point within the pelvis, likely representing high-grade small bowel obstruction. Trace amount of free pelvic fluid. No pneumoperitoneum. 2. Equivocal wall thickening of the descending colon/colitis. 3. Multiple healing fractures of LEFT ribs and LEFT lumbar spine transverse processes. 4.  Aortic Atherosclerosis (ICD10-I70.0). Electronically Signed   By: Harmon Pier M.D.   On: 11/09/2018 09:10   Dg Chest 2 View  Result Date: 11/09/2018 CLINICAL DATA:  Abdominal pain with nausea and vomiting. EXAM: CHEST - 2 VIEW COMPARISON:  Chest x-ray dated September 06, 2012. FINDINGS: The heart size and mediastinal contours are within normal limits. Both lungs are clear. The visualized skeletal structures are unremarkable. IMPRESSION: No active cardiopulmonary disease. Electronically Signed   By: Obie Dredge M.D.   On: 11/09/2018 08:55    Anti-infectives: Anti-infectives (From admission, onward)   None      Assessment/Plan: Impression: Partial small bowel obstruction.  Nothing has come out the NG tube.  Patient is passing flatus.  Patient was noted to be belligerent earlier.  We will go ahead  and DC NG tube.  Start clear liquid diet.  No need for acute surgical invention at this time.  LOS: 1 day    Franky Macho 11/10/2018

## 2018-11-10 NOTE — Progress Notes (Signed)
PROGRESS NOTE    [name]  JJO:[id number] DOB: [birth year] DOA: 11/09/2018 PCP: Patient, No Pcp Per     Brief Narrative:  53 y.o. male with PMH of prior SBO, Renal failure (due to rhabdomyolysis), hx of cirrhosis, hepatitis C, polysubstance abuse, hypothyroidsm, HTH, depression and chronic pain; who came to the ED due to a gradual, constant, diffuse abdominal pain that started 48 hours prior to admission. Pain is worsening, non radiated, 8/10 in intensity and associated with multiple episodes of nausea, vomiting and loose stools. Denies recent travel, fevers, hemoptysis, hematuria, melena or hematochezia. Patient states he was discharge on 10/25/18 from [facility] with a diagnosis of SBO but discharge summary indicates colitis and didn't;t require surgical intervention..   CT of abdomen and pelvis reported dilated proximal mid small bowel loops suggesting a high-grade small bowel obstruction and wall thickening of the descending colon. Multiple healing fractures of left ribs and left lumbar spine transverse processes were also noted   Assessment & Plan: 1-SBO backslash partial SBO -NG tube in place -Continue n.p.o. -Follow general surgery recommendations -Start Chloraseptic Spray and allows ice chips -Hopefully soon will be able to remove the tube and slowly advance diet. -Patient reports no further nausea/vomiting and is no having any fevers.  2-hypothyroidism -Was not taking any medications at home -TSH within normal limits  3-GERD -Continue PPI  4-history of polysubstance abuse -Continue nicotine patch -Continue CIWA protocol -Cessation counseling provided.  5-history of chronic pain -Continue as needed pain medication.  6-history of depression -As soon as able to tolerate by mouth we will resume the use of Remeron.  7-cirrhosis/hepatitis C -Planning to resume lactulose when able to take by mouth -Patient encouraged to quit drinking -Outpatient follow-up for underlying  history of hepatitis C  8-hypertension -Continue IV Lopressor.   DVT prophylaxis: Heparin Code Status: Full code Family Communication: No family bedside Disposition Plan: Follow general surgery recommendations, hopefully able to remove NG tube later today; allow ice chips/sips of water.  Consultants:   General surgery  Procedures:   See below for x-ray report  Antimicrobials:  Anti-infectives (From admission, onward)   None      Subjective: According to nursing staff patient was belligerent earlier, cursing, expressing that he was really hungry and that he wants the NG tube out.  On my evaluation he was in agreement to wait for general surgery evaluation, ice chips and Chloraseptic Spray will be allow.  Denies any further episode of nausea/vomiting and reports that he is passing flatus.  No chest pain, no shortness of breath.  Objective: Vitals:   11/09/18 1636 11/09/18 1930 11/09/18 2106 11/10/18 0128  BP: 107/77 112/76  114/81  Pulse: (!) 111 (!) 103  (!) 106  Resp:  18  19  Temp:  99.1 F (37.3 C)  98.6 F (37 C)  TempSrc:  Oral  Oral  SpO2:  96% 94% 98%  Weight:      Height:        Intake/Output Summary (Last 24 hours) at 11/10/2018 1211 Last data filed at 11/10/2018 0912 Gross per 24 hour  Intake 1019.67 ml  Output 1600 ml  Net -580.33 ml   Filed Weights   11/09/18 0733 11/09/18 1333  Weight: 46.7 kg 54.3 kg    Examination: General exam: Alert, awake, oriented x 3; reporting sore throat, no fever, no further nausea vomiting.  Reports improvement in his abdominal pain and would like to have NG tube out. Respiratory system: Clear to auscultation. Respiratory effort  normal. Cardiovascular system:RRR. No murmurs, rubs, gallops. Gastrointestinal system: Abdomen is softer on palpation in his left lower quadrant; no guarding, positive bowel sounds.   Central nervous system: Alert and oriented. No focal neurological deficits. Extremities: No C/C/E, +pedal  pulses Skin: No rashes, lesions or ulcers Psychiatry: Judgement and insight appear normal. Mood & affect appropriate.     Data Reviewed: I have personally reviewed following labs and imaging studies  CBC: Recent Labs  Lab 11/09/18 0810  WBC 11.3*  NEUTROABS 7.0  HGB 9.8*  HCT 31.9*  MCV 90.6  PLT 428*   Basic Metabolic Panel: Recent Labs  Lab 11/09/18 0810  NA 132*  K 4.6  CL 99  CO2 25  GLUCOSE 127*  BUN 17  CREATININE 1.00  CALCIUM 8.4*  MG 1.7  PHOS 4.7*   GFR: Estimated Creatinine Clearance: 67.1 mL/min (by C-G formula based on SCr of 1 mg/dL).   Liver Function Tests: Recent Labs  Lab 11/09/18 0810  AST 12*  ALT 10  ALKPHOS 60  BILITOT 0.1*  PROT 5.8*  ALBUMIN 2.5*   Recent Labs  Lab 11/09/18 0810  LIPASE 55*   Coagulation Profile: Recent Labs  Lab 11/09/18 0810  INR 0.88   Cardiac Enzymes: Recent Labs  Lab 11/09/18 0810  CKTOTAL 77  TROPONINI <0.03   Thyroid Function Tests: Recent Labs    11/09/18 0810  TSH 2.910   Urine analysis:    Component Value Date/Time   COLORURINE YELLOW 11/09/2018 0752   APPEARANCEUR CLEAR 11/09/2018 0752   LABSPEC 1.011 11/09/2018 0752   PHURINE 6.0 11/09/2018 0752   GLUCOSEU NEGATIVE 11/09/2018 0752   HGBUR NEGATIVE 11/09/2018 0752   BILIRUBINUR NEGATIVE 11/09/2018 0752   KETONESUR NEGATIVE 11/09/2018 0752   PROTEINUR NEGATIVE 11/09/2018 0752   UROBILINOGEN 0.2 07/24/2014 1725   NITRITE NEGATIVE 11/09/2018 0752   LEUKOCYTESUR NEGATIVE 11/09/2018 0752    Radiology Studies: Ct Abdomen Pelvis Wo Contrast  Result Date: 11/09/2018 CLINICAL DATA:  53 year old male with acute abdominal and pelvic pain for 1 day. Reported history of cirrhosis. EXAM: CT ABDOMEN AND PELVIS WITHOUT CONTRAST TECHNIQUE: Multidetector CT imaging of the abdomen and pelvis was performed following the standard protocol without IV contrast. COMPARISON:  09/21/2018 and prior CTs FINDINGS: Please note that parenchymal  abnormalities may be missed without intravenous contrast. Lower chest: No acute abnormalities. Hepatobiliary: No hepatic or gallbladder abnormalities. No biliary dilatation. Pancreas: Unremarkable Spleen: UPPER limits normal spleen size noted. Adrenals/Urinary Tract: The kidneys, adrenal glands and bladder are unremarkable. Stomach/Bowel: Dilated proximal and mid small bowel loops are identified with tapered transition point noted within the mid small bowel in the pelvis, likely representing a high-grade small bowel obstruction. A trace amount of free pelvic fluid is noted. There is no evidence of pneumoperitoneum. Stool and gas in the colon noted. Equivocal wall thickening of the descending colon could represent a colitis. Vascular/Lymphatic: Aortic atherosclerosis. No enlarged abdominal or pelvic lymph nodes. Reproductive: Prostate is unremarkable. Other: None Musculoskeletal: No acute or suspicious abnormalities. Multiple healing fractures of posterior LOWER LEFT ribs and lumbar spine LEFT transverse processes noted. Moderate degenerative disc disease/spondylosis at L5-S1 noted. IMPRESSION: 1. Dilated proximal mid small bowel loops with taper transition point within the pelvis, likely representing high-grade small bowel obstruction. Trace amount of free pelvic fluid. No pneumoperitoneum. 2. Equivocal wall thickening of the descending colon/colitis. 3. Multiple healing fractures of LEFT ribs and LEFT lumbar spine transverse processes. 4.  Aortic Atherosclerosis (ICD10-I70.0). Electronically Signed   By: Tinnie Gens  Hu M.D.   On: 11/09/2018 09:10   Dg Chest 2 View  Result Date: 11/09/2018 CLINICAL DATA:  Abdominal pain with nausea and vomiting. EXAM: CHEST - 2 VIEW COMPARISON:  Chest x-ray dated September 06, 2012. FINDINGS: The heart size and mediastinal contours are within normal limits. Both lungs are clear. The visualized skeletal structures are unremarkable. IMPRESSION: No active cardiopulmonary disease.  Electronically Signed   By: Obie Dredge M.D.   On: 11/09/2018 08:55    Scheduled Meds: . bisacodyl  10 mg Rectal Once  . folic acid  1 mg Oral Daily  . heparin injection (subcutaneous)  5,000 Units Subcutaneous Q8H  . metoprolol tartrate  2.5 mg Intravenous Q8H  . multivitamin with minerals  1 tablet Oral Daily  . nicotine  21 mg Transdermal Daily  . pantoprazole (PROTONIX) IV  40 mg Intravenous Q24H  . thiamine  100 mg Oral Daily   Or  . thiamine  100 mg Intravenous Daily   Continuous Infusions: . 0.9 % NaCl with KCl 40 mEq / L 100 mL/hr (11/09/18 1634)     LOS: 1 day    Time spent: 25 minutes   Vassie Loll, MD [facility] Pager [phone]   11/10/2018, 12:11 PM

## 2018-11-11 LAB — BASIC METABOLIC PANEL
Anion gap: 4 — ABNORMAL LOW (ref 5–15)
BUN: 11 mg/dL (ref 6–20)
CHLORIDE: 105 mmol/L (ref 98–111)
CO2: 22 mmol/L (ref 22–32)
Calcium: 8 mg/dL — ABNORMAL LOW (ref 8.9–10.3)
Creatinine, Ser: 0.89 mg/dL (ref 0.61–1.24)
GFR calc Af Amer: >60 mL/min (ref >60)
GFR calc non Af Amer: >60 mL/min (ref >60)
Glucose, Bld: 87 mg/dL (ref 70–99)
POTASSIUM: 5.8 mmol/L — AB (ref 3.5–5.1)
Sodium: 131 mmol/L — ABNORMAL LOW (ref 135–145)

## 2018-11-11 LAB — CBC
HCT: 25.7 % — ABNORMAL LOW (ref 39.0–52.0)
Hemoglobin: 8 g/dL — ABNORMAL LOW (ref 13.0–17.0)
MCH: 28.9 pg (ref 26.0–34.0)
MCHC: 31.1 g/dL (ref 30.0–36.0)
MCV: 92.8 fL (ref 80.0–100.0)
Platelets: 316 10*3/uL (ref 150–400)
RBC: 2.77 MIL/uL — ABNORMAL LOW (ref 4.22–5.81)
RDW: 14.8 % (ref 11.5–15.5)
WBC: 6.4 10*3/uL (ref 4.0–10.5)
nRBC: 0 % (ref 0.0–0.2)

## 2018-11-11 MED ORDER — PANTOPRAZOLE SODIUM 40 MG PO TBEC
40.0000 mg | DELAYED_RELEASE_TABLET | Freq: Every day | ORAL | Status: DC
  Administered 2018-11-11 – 2018-11-12 (×2): 40 mg via ORAL
  Filled 2018-11-11 (×2): qty 1

## 2018-11-11 MED ORDER — SODIUM CHLORIDE 0.9 % IV SOLN: INTRAVENOUS | Status: DC

## 2018-11-11 MED ORDER — MIRTAZAPINE 15 MG PO TABS
30.0000 mg | ORAL_TABLET | Freq: Every day | ORAL | Status: DC
  Administered 2018-11-11: 30 mg via ORAL
  Filled 2018-11-11: qty 2

## 2018-11-11 MED ORDER — CARVEDILOL 3.125 MG PO TABS
6.2500 mg | ORAL_TABLET | Freq: Two times a day (BID) | ORAL | Status: DC
  Administered 2018-11-11 – 2018-11-12 (×2): 6.25 mg via ORAL
  Filled 2018-11-11 (×2): qty 2

## 2018-11-11 NOTE — Care Management Important Message (Signed)
Important Message  Patient Details  Name: [name] MRN: [id number] Date of Birth: 23-Oct-1965   Medicare Important Message Given:  Yes    Corey Harold 11/11/2018, 1:44 PM

## 2018-11-11 NOTE — Progress Notes (Signed)
Since starting soft diet for lunch has eaten almost 100% or meals and denies nausea and any increased pain but continues to ask for pain medication for abd pain rated a 7 or 8

## 2018-11-11 NOTE — Progress Notes (Signed)
PROGRESS NOTE    [name]  AEW:[id number] DOB: May 27, 1966 DOA: 11/09/2018 PCP: Patient, No Pcp Per     Brief Narrative:  53 y.o. male with PMH of prior SBO, Renal failure (due to rhabdomyolysis), hx of cirrhosis, hepatitis C, polysubstance abuse, hypothyroidsm, HTH, depression and chronic pain; who came to the ED due to a gradual, constant, diffuse abdominal pain that started 48 hours prior to admission. Pain is worsening, non radiated, 8/10 in intensity and associated with multiple episodes of nausea, vomiting and loose stools. Denies recent travel, fevers, hemoptysis, hematuria, melena or hematochezia. Patient states he was discharge on 10/25/18 from [facility] with a diagnosis of SBO but discharge summary indicates colitis and didn't;t require surgical intervention..   CT of abdomen and pelvis reported dilated proximal mid small bowel loops suggesting a high-grade small bowel obstruction and wall thickening of the descending colon. Multiple healing fractures of left ribs and left lumbar spine transverse processes were also noted   Assessment & Plan: 1-SBO backslash partial SBO -NG removed on 2/23 afternoon and full liquid diet allowed. Will no transition to soft diet and follow tolerance. Hopefully home in am.  -Patient reports no further nausea/vomiting and is no having any fevers.  2-hypothyroidism -Was not taking any medications at home -TSH within normal limits -continue outpatient monitoring   3-GERD -Continue PPI, changing route to PO  4-history of polysubstance abuse -Continue nicotine patch -Continue CIWA protocol -Cessation counseling provided.  5-history of chronic pain -Continue as needed pain medication.  6-history of depression -will resume remeron  7-cirrhosis/hepatitis C -Planning to resume lactulose when able to take by mouth -Patient encouraged to quit drinking -Outpatient follow-up for underlying history of hepatitis C  8-hypertension -Change Lopressor  to p.o.   DVT prophylaxis: Heparin Code Status: Full code Family Communication: No family bedside Disposition Plan: Following general surgery recommendations diet will be further advanced today and will monitor for any nausea/vomiting or worsening abdominal pain.  Patient instructed to increase physical activity.  Consultants:   General surgery  Procedures:   See below for x-ray report  Antimicrobials:  Anti-infectives (From admission, onward)   None      Subjective: Afebrile, no chest pain, no nausea, no vomiting.  Reports having multiple bowel movements and even is still having mild discomfort in his abdomen is feeling better.  In agreement to have diet further advance and assess stability prior to discharge.  Objective: Vitals:   11/11/18 0004 11/11/18 0604 11/11/18 1200 11/11/18 1430  BP: 107/71 114/86 112/81 116/81  Pulse: 97 (!) 106 (!) 102 (!) 104  Resp: 16 18  16   Temp: 98.7 F (37.1 C) 98.8 F (37.1 C)  99.1 F (37.3 C)  TempSrc: Oral Oral  Oral  SpO2: 99% 98%  100%  Weight:      Height:        Intake/Output Summary (Last 24 hours) at 11/11/2018 1614 Last data filed at 11/11/2018 1441 Gross per 24 hour  Intake 5143.18 ml  Output 2425 ml  Net 2718.18 ml   Filed Weights   11/09/18 0733 11/09/18 1333  Weight: 46.7 kg 54.3 kg    Examination: General exam: Alert, awake, oriented x 3; reports feeling better.  Tolerated full liquid diet yesterday.  Reports multiple bowel movements.  No nausea, no vomiting.  Mild abdominal discomfort still present. Respiratory system: Clear to auscultation. Respiratory effort normal. Cardiovascular system:RRR. No murmurs, rubs, gallops. Gastrointestinal system: Abdomen is nondistended, soft and just mildly tender to palpation in the  left lower quadrant. No organomegaly or masses felt. Normal bowel sounds heard. Central nervous system: Alert and oriented. No focal neurological deficits. Extremities: No C/C/E, +pedal  pulses Skin: No rashes, lesions or ulcers Psychiatry: Judgement and insight appear normal. Mood & affect appropriate.   Data Reviewed: I have personally reviewed following labs and imaging studies  CBC: Recent Labs  Lab 11/09/18 0810 11/11/18 0426  WBC 11.3* 6.4  NEUTROABS 7.0  --   HGB 9.8* 8.0*  HCT 31.9* 25.7*  MCV 90.6 92.8  PLT 428* 316   Basic Metabolic Panel: Recent Labs  Lab 11/09/18 0810 11/11/18 0426  NA 132* 131*  K 4.6 5.8*  CL 99 105  CO2 25 22  GLUCOSE 127* 87  BUN 17 11  CREATININE 1.00 0.89  CALCIUM 8.4* 8.0*  MG 1.7  --   PHOS 4.7*  --    GFR: Estimated Creatinine Clearance: 75.4 mL/min (by C-G formula based on SCr of 0.89 mg/dL).   Liver Function Tests: Recent Labs  Lab 11/09/18 0810  AST 12*  ALT 10  ALKPHOS 60  BILITOT 0.1*  PROT 5.8*  ALBUMIN 2.5*   Recent Labs  Lab 11/09/18 0810  LIPASE 55*   Coagulation Profile: Recent Labs  Lab 11/09/18 0810  INR 0.88   Cardiac Enzymes: Recent Labs  Lab 11/09/18 0810  CKTOTAL 77  TROPONINI <0.03   Thyroid Function Tests: Recent Labs    11/09/18 0810  TSH 2.910   Urine analysis:    Component Value Date/Time   COLORURINE YELLOW 11/09/2018 0752   APPEARANCEUR CLEAR 11/09/2018 0752   LABSPEC 1.011 11/09/2018 0752   PHURINE 6.0 11/09/2018 0752   GLUCOSEU NEGATIVE 11/09/2018 0752   HGBUR NEGATIVE 11/09/2018 0752   BILIRUBINUR NEGATIVE 11/09/2018 0752   KETONESUR NEGATIVE 11/09/2018 0752   PROTEINUR NEGATIVE 11/09/2018 0752   UROBILINOGEN 0.2 07/24/2014 1725   NITRITE NEGATIVE 11/09/2018 0752   LEUKOCYTESUR NEGATIVE 11/09/2018 0752    Radiology Studies: No results found.  Scheduled Meds: . folic acid  1 mg Oral Daily  . heparin injection (subcutaneous)  5,000 Units Subcutaneous Q8H  . metoprolol tartrate  2.5 mg Intravenous Q8H  . multivitamin with minerals  1 tablet Oral Daily  . nicotine  21 mg Transdermal Daily  . pantoprazole  40 mg Oral Daily  . thiamine  100 mg  Oral Daily   Or  . thiamine  100 mg Intravenous Daily   Continuous Infusions: . sodium chloride       LOS: 2 days    Time spent: 25 minutes   Vassie Loll, MD [facility] Pager [phone]   11/11/2018, 4:14 PM

## 2018-11-11 NOTE — Progress Notes (Signed)
Subjective: Patient had multiple bowel movements yesterday.  Denies any significant nausea or vomiting.  Minimal abdominal pain.  Objective: Vital signs in last 24 hours: Temp:  [98.2 F (36.8 C)-99.8 F (37.7 C)] 98.8 F (37.1 C) (02/24 0604) Pulse Rate:  [97-116] 106 (02/24 0604) Resp:  [16-18] 18 (02/24 0604) BP: (105-121)/(71-89) 114/86 (02/24 0604) SpO2:  [96 %-100 %] 98 % (02/24 0604) Last BM Date: 11/10/18  Intake/Output from previous day: 02/23 0701 - 02/24 0700 In: 4903.2 [P.O.:2280; I.V.:2623.2] Out: 3325 [Urine:3325] Intake/Output this shift: No intake/output data recorded.  General appearance: alert, cooperative and no distress GI: soft, non-tender; bowel sounds normal; no masses,  no organomegaly  Lab Results:  Recent Labs    11/09/18 0810 11/11/18 0426  WBC 11.3* 6.4  HGB 9.8* 8.0*  HCT 31.9* 25.7*  PLT 428* 316   BMET Recent Labs    11/09/18 0810 11/11/18 0426  NA 132* 131*  K 4.6 5.8*  CL 99 105  CO2 25 22  GLUCOSE 127* 87  BUN 17 11  CREATININE 1.00 0.89  CALCIUM 8.4* 8.0*   PT/INR Recent Labs    11/09/18 0810  LABPROT 11.9  INR 0.88    Studies/Results: Ct Abdomen Pelvis Wo Contrast  Result Date: 11/09/2018 CLINICAL DATA:  53 year old male with acute abdominal and pelvic pain for 1 day. Reported history of cirrhosis. EXAM: CT ABDOMEN AND PELVIS WITHOUT CONTRAST TECHNIQUE: Multidetector CT imaging of the abdomen and pelvis was performed following the standard protocol without IV contrast. COMPARISON:  09/21/2018 and prior CTs FINDINGS: Please note that parenchymal abnormalities may be missed without intravenous contrast. Lower chest: No acute abnormalities. Hepatobiliary: No hepatic or gallbladder abnormalities. No biliary dilatation. Pancreas: Unremarkable Spleen: UPPER limits normal spleen size noted. Adrenals/Urinary Tract: The kidneys, adrenal glands and bladder are unremarkable. Stomach/Bowel: Dilated proximal and mid small bowel  loops are identified with tapered transition point noted within the mid small bowel in the pelvis, likely representing a high-grade small bowel obstruction. A trace amount of free pelvic fluid is noted. There is no evidence of pneumoperitoneum. Stool and gas in the colon noted. Equivocal wall thickening of the descending colon could represent a colitis. Vascular/Lymphatic: Aortic atherosclerosis. No enlarged abdominal or pelvic lymph nodes. Reproductive: Prostate is unremarkable. Other: None Musculoskeletal: No acute or suspicious abnormalities. Multiple healing fractures of posterior LOWER LEFT ribs and lumbar spine LEFT transverse processes noted. Moderate degenerative disc disease/spondylosis at L5-S1 noted. IMPRESSION: 1. Dilated proximal mid small bowel loops with taper transition point within the pelvis, likely representing high-grade small bowel obstruction. Trace amount of free pelvic fluid. No pneumoperitoneum. 2. Equivocal wall thickening of the descending colon/colitis. 3. Multiple healing fractures of LEFT ribs and LEFT lumbar spine transverse processes. 4.  Aortic Atherosclerosis (ICD10-I70.0). Electronically Signed   By: Harmon Pier M.D.   On: 11/09/2018 09:10   Dg Chest 2 View  Result Date: 11/09/2018 CLINICAL DATA:  Abdominal pain with nausea and vomiting. EXAM: CHEST - 2 VIEW COMPARISON:  Chest x-ray dated September 06, 2012. FINDINGS: The heart size and mediastinal contours are within normal limits. Both lungs are clear. The visualized skeletal structures are unremarkable. IMPRESSION: No active cardiopulmonary disease. Electronically Signed   By: Obie Dredge M.D.   On: 11/09/2018 08:55    Anti-infectives: Anti-infectives (From admission, onward)   None      Assessment/Plan: Impression: Partial small bowel obstruction, resolving.  Tolerating full liquid diet well.  Will advance to soft diet.  No need for surgical  intervention.  Anticipate discharge within the next 24 hours.  LOS: 2  days    Franky Macho 11/11/2018

## 2018-11-12 DIAGNOSIS — F32A Depression, unspecified: Secondary | ICD-10-CM

## 2018-11-12 DIAGNOSIS — I10 Essential (primary) hypertension: Secondary | ICD-10-CM

## 2018-11-12 DIAGNOSIS — Z87898 Personal history of other specified conditions: Secondary | ICD-10-CM

## 2018-11-12 DIAGNOSIS — F329 Major depressive disorder, single episode, unspecified: Secondary | ICD-10-CM

## 2018-11-12 LAB — CBC
HCT: 30 % — ABNORMAL LOW (ref 39.0–52.0)
Hemoglobin: 9.3 g/dL — ABNORMAL LOW (ref 13.0–17.0)
MCH: 28.5 pg (ref 26.0–34.0)
MCHC: 31 g/dL (ref 30.0–36.0)
MCV: 92 fL (ref 80.0–100.0)
PLATELETS: 430 10*3/uL — AB (ref 150–400)
RBC: 3.26 MIL/uL — ABNORMAL LOW (ref 4.22–5.81)
RDW: 14.6 % (ref 11.5–15.5)
WBC: 7.7 10*3/uL (ref 4.0–10.5)
nRBC: 0 % (ref 0.0–0.2)

## 2018-11-12 LAB — BASIC METABOLIC PANEL
Anion gap: 8 (ref 5–15)
BUN: 10 mg/dL (ref 6–20)
CO2: 23 mmol/L (ref 22–32)
Calcium: 8.1 mg/dL — ABNORMAL LOW (ref 8.9–10.3)
Chloride: 103 mmol/L (ref 98–111)
Creatinine, Ser: 1.1 mg/dL (ref 0.61–1.24)
GFR calc Af Amer: >60 mL/min (ref >60)
GFR calc non Af Amer: >60 mL/min (ref >60)
Glucose, Bld: 97 mg/dL (ref 70–99)
Potassium: 4.5 mmol/L (ref 3.5–5.1)
Sodium: 134 mmol/L — ABNORMAL LOW (ref 135–145)

## 2018-11-12 MED ORDER — CARVEDILOL 6.25 MG PO TABS: 6.2500 mg | ORAL_TABLET | Freq: Two times a day (BID) | ORAL | 1 refills | Status: DC

## 2018-11-12 MED ORDER — FOLIC ACID 1 MG PO TABS: 1.0000 mg | ORAL_TABLET | Freq: Every day | ORAL | 1 refills | Status: DC

## 2018-11-12 MED ORDER — PANTOPRAZOLE SODIUM 40 MG PO TBEC: 40.0000 mg | DELAYED_RELEASE_TABLET | Freq: Every day | ORAL | 1 refills | Status: DC

## 2018-11-12 MED ORDER — LACTULOSE 10 GM/15ML PO SOLN: 10.0000 g | Freq: Two times a day (BID) | ORAL | 0 refills | Status: DC | PRN

## 2018-11-12 MED ORDER — THIAMINE HCL 100 MG PO TABS: 100.0000 mg | ORAL_TABLET | Freq: Every day | ORAL | 1 refills | Status: DC

## 2018-11-12 MED ORDER — MIRTAZAPINE 30 MG PO TABS: 30.0000 mg | ORAL_TABLET | Freq: Every day | ORAL | 1 refills | Status: DC

## 2018-11-12 MED ORDER — NICOTINE 21 MG/24HR TD PT24: 21.0000 mg | MEDICATED_PATCH | Freq: Every day | TRANSDERMAL | 1 refills | Status: DC

## 2018-11-12 NOTE — Progress Notes (Signed)
Has been eating 100% of soft diet meals since yesterday at lunch and denies nausea put still requesting morphine for pain.  To be discharged home.  IV removed and discharge instructions reviewed.  To call family for ride home

## 2018-11-12 NOTE — Progress Notes (Signed)
  Subjective: Patient tolerating regular diet well.  Denies to me any significant abdominal pain.  Objective: Vital signs in last 24 hours: Temp:  [99.1 F (37.3 C)-99.4 F (37.4 C)] 99.3 F (37.4 C) (02/25 0607) Pulse Rate:  [102-113] 113 (02/25 0607) Resp:  [16-20] 20 (02/25 0607) BP: (104-116)/(81-86) 114/83 (02/25 0607) SpO2:  [96 %-100 %] 96 % (02/25 0607) Last BM Date: 11/11/18  Intake/Output from previous day: 02/24 0701 - 02/25 0700 In: 1920 [P.O.:1920] Out: 200 [Urine:200] Intake/Output this shift: No intake/output data recorded.  General appearance: alert, cooperative and no distress GI: soft, non-tender; bowel sounds normal; no masses,  no organomegaly  Lab Results:  Recent Labs    11/11/18 0426 11/12/18 0410  WBC 6.4 7.7  HGB 8.0* 9.3*  HCT 25.7* 30.0*  PLT 316 430*   BMET Recent Labs    11/11/18 0426 11/12/18 0410  NA 131* 134*  K 5.8* 4.5  CL 105 103  CO2 22 23  GLUCOSE 87 97  BUN 11 10  CREATININE 0.89 1.10  CALCIUM 8.0* 8.1*   PT/INR No results for input(s): LABPROT, INR in the last 72 hours.  Studies/Results: No results found.  Anti-infectives: Anti-infectives (From admission, onward)   None      Assessment/Plan: Impression: Partial small bowel obstruction, resolved Plan: No need for surgical intervention at this time.  Okay for discharge from surgery standpoint.  LOS: 3 days    [name] 11/12/2018

## 2018-11-12 NOTE — Discharge Summary (Signed)
Physician Discharge Summary  [name]:[id number] DOB: December 03, 1965 DOA: 11/09/2018  PCP: Patient, No Pcp Per  Admit date: 11/09/2018 Discharge date: 11/12/2018  Time spent: 35 minutes  Recommendations for Outpatient Follow-up:  1. Repeat basic metabolic panel to follow electrolytes and renal function. 2. Continue assisting with cessation for alcohol and tobacco abuse.   Discharge Diagnoses:  Active Problems:   Tobacco abuse   Nausea and vomiting in adult patient   SBO (small bowel obstruction) (HCC)   History of alcohol use   Benign essential HTN   Depression   Discharge Condition: Stable and improved.  Patient discharged home with instruction to establish care and follow-up with a primary care physician in 2 weeks.  Diet recommendation: Heart healthy/soft diet  Filed Weights   11/09/18 0733 11/09/18 1333  Weight: 46.7 kg 54.3 kg    History of present illness:  53 y.o.malewith PMH ofpriorSBO,Renal failure(due to rhabdomyolysis),hx ofcirrhosis, hepatitis C, polysubstance abuse, hypothyroidsm,HTH,depression and chronic pain;who came to the ED due to a gradual, constant, diffuse abdominal pain that started48 hours prior to admission. Pain is worsening, non radiated, 8/10 in intensity and associated withmultiple episodes of nausea, vomiting and loose stools.Denies recent travel, fevers, hemoptysis, hematuria, melena or hematochezia. Patient states he was discharge on 10/25/18 from [facility] with a diagnosis of SBO but discharge summary indicates colitisand didn't;t require surgical intervention..   CT of abdomen and pelvis reported dilated proximal mid small bowel loops suggesting a high-grade small bowel obstruction and wall thickening of the descending colon.Multiple healing fractures of left ribs and left lumbar spine transverse processes were also noted  Hospital Course:  1-SBO backslash partial SBO -NG removed on 2/23 afternoon and full liquid diet allowed;  patient tolerated it well and his diet was further advanced to regular consistency.  No further nausea, vomiting or significant abdominal discomfort.  Patient having good bowel movements and in no distress. -Patient will be discharged home with instruction to maintain adequate hydration and to follow-up with PCP in 2 weeks. -No surgical intervention was needed; appreciate assistance by general surgery service.  2-hypothyroidism -Was not taking any medications at home -TSH within normal limits -continue outpatient monitoring   3-GERD -Continue PPI  4-history of polysubstance abuse -Continue nicotine patch at discharge -No withdrawal -Cessation counseling provided. -Prescription for thiamine and folic acid provided.  5-history of chronic pain -Continue as needed pain medication.  6-history of depression -will resume remeron -Stable mood; no suicidal ideation or hallucinations.  7-cirrhosis/hepatitis C -Planning to resume lactulose at discharge -Patient encouraged to quit drinking alcohol and to maintain adequate hydration. -Outpatient follow-up for underlying history of hepatitis C -No signs of ascites on physical exam.  8-hypertension -Advised to follow heart healthy diet -Discharged on Coreg, twice daily.  Procedures:  See below for x-ray reports.  Consultations:  General surgery  Discharge Exam: Vitals:   11/11/18 2133 11/12/18 0607  BP: 104/86 114/83  Pulse: (!) 105 (!) 113  Resp: 20 20  Temp: 99.4 F (37.4 C) 99.3 F (37.4 C)  SpO2: 99% 96%    General: Afebrile, no further nausea or vomiting, denies chest pain or shortness of breath.  Very little abdominal discomfort in his left lower quadrant reported.  Tolerating regular diet. Cardiovascular: S1-S2, no rubs, no gallops, no JVD. Respiratory: Good air movement bilaterally, normal respiratory effort.  Good oxygen saturation on room air. Abdomen: Soft, nondistended, positive bowel sounds, no guarding.   Mild left lower quadrant discomfort with deep palpation. Extremities: No edema, no  cyanosis, no clubbing.  Discharge Instructions   Discharge Instructions    Diet - low sodium heart healthy   Complete by:  As directed    Discharge instructions   Complete by:  As directed    Take medication as prescribed Stop smoking Stop alcohol consumption. Establish care with primary care physician and arrange follow-up in 2 weeks. Follow heart healthy/soft diet. Keep yourself well-hydrated.     Allergies as of 11/12/2018      Reactions   Acetaminophen Other (See Comments)   Liver disease       Medication List    STOP taking these medications   multivitamin with minerals Tabs tablet     TAKE these medications   carvedilol 6.25 MG tablet Commonly known as:  COREG Take 1 tablet (6.25 mg total) by mouth 2 (two) times daily with a meal.   diphenhydramine-acetaminophen 25-500 MG Tabs tablet Commonly known as:  TYLENOL PM Take 1 tablet by mouth at bedtime as needed for pain.   folic acid 1 MG tablet Commonly known as:  FOLVITE Take 1 tablet (1 mg total) by mouth daily. Start taking on:  November 13, 2018   lactulose 10 GM/15ML solution Commonly known as:  CHRONULAC Take 15 mLs (10 g total) by mouth 2 (two) times daily as needed (to achieve 2-3 BM's daily; hold if having desired amount of BM's without use of lactulose.).   mirtazapine 30 MG tablet Commonly known as:  REMERON Take 1 tablet (30 mg total) by mouth at bedtime.   nicotine 21 mg/24hr patch Commonly known as:  NICODERM CQ - dosed in mg/24 hours Place 1 patch (21 mg total) onto the skin daily. Start taking on:  November 13, 2018   pantoprazole 40 MG tablet Commonly known as:  PROTONIX Take 1 tablet (40 mg total) by mouth daily. Start taking on:  November 13, 2018   thiamine 100 MG tablet Take 1 tablet (100 mg total) by mouth daily. Start taking on:  November 13, 2018      Allergies  Allergen Reactions  .  Acetaminophen Other (See Comments)    Liver disease     The results of significant diagnostics from this hospitalization (including imaging, microbiology, ancillary and laboratory) are listed below for reference.    Significant Diagnostic Studies: Ct Abdomen Pelvis Wo Contrast  Result Date: 11/09/2018 CLINICAL DATA:  53 year old male with acute abdominal and pelvic pain for 1 day. Reported history of cirrhosis. EXAM: CT ABDOMEN AND PELVIS WITHOUT CONTRAST TECHNIQUE: Multidetector CT imaging of the abdomen and pelvis was performed following the standard protocol without IV contrast. COMPARISON:  09/21/2018 and prior CTs FINDINGS: Please note that parenchymal abnormalities may be missed without intravenous contrast. Lower chest: No acute abnormalities. Hepatobiliary: No hepatic or gallbladder abnormalities. No biliary dilatation. Pancreas: Unremarkable Spleen: UPPER limits normal spleen size noted. Adrenals/Urinary Tract: The kidneys, adrenal glands and bladder are unremarkable. Stomach/Bowel: Dilated proximal and mid small bowel loops are identified with tapered transition point noted within the mid small bowel in the pelvis, likely representing a high-grade small bowel obstruction. A trace amount of free pelvic fluid is noted. There is no evidence of pneumoperitoneum. Stool and gas in the colon noted. Equivocal wall thickening of the descending colon could represent a colitis. Vascular/Lymphatic: Aortic atherosclerosis. No enlarged abdominal or pelvic lymph nodes. Reproductive: Prostate is unremarkable. Other: None Musculoskeletal: No acute or suspicious abnormalities. Multiple healing fractures of posterior LOWER LEFT ribs and lumbar spine LEFT transverse processes noted. Moderate degenerative  disc disease/spondylosis at L5-S1 noted. IMPRESSION: 1. Dilated proximal mid small bowel loops with taper transition point within the pelvis, likely representing high-grade small bowel obstruction. Trace amount of  free pelvic fluid. No pneumoperitoneum. 2. Equivocal wall thickening of the descending colon/colitis. 3. Multiple healing fractures of LEFT ribs and LEFT lumbar spine transverse processes. 4.  Aortic Atherosclerosis (ICD10-I70.0). Electronically Signed   By: Harmon Pier M.D.   On: 11/09/2018 09:10   Dg Chest 2 View  Result Date: 11/09/2018 CLINICAL DATA:  Abdominal pain with nausea and vomiting. EXAM: CHEST - 2 VIEW COMPARISON:  Chest x-ray dated September 06, 2012. FINDINGS: The heart size and mediastinal contours are within normal limits. Both lungs are clear. The visualized skeletal structures are unremarkable. IMPRESSION: No active cardiopulmonary disease. Electronically Signed   By: Obie Dredge M.D.   On: 11/09/2018 08:55    Microbiology: Recent Results (from the past 240 hour(s))  Urine culture     Status: None   Collection Time: 11/09/18  7:52 AM  Result Value Ref Range Status   Specimen Description   Final    URINE, RANDOM Performed at [facility], [address] [place], [place] [address]    Special Requests   Final    NONE Performed at [facility], [address] [place], [place] [address]    Culture   Final    NO GROWTH Performed at [facility], [address] [place], [place] [address]    Report Status 11/10/2018 FINAL  Final     Labs: Basic Metabolic Panel: Recent Labs  Lab 11/09/18 0810 11/11/18 0426 11/12/18 0410  NA 132* 131* 134*  K 4.6 5.8* 4.5  CL 99 105 103  CO2 25 22 23   GLUCOSE 127* 87 97  BUN 17 11 10   CREATININE 1.00 0.89 1.10  CALCIUM 8.4* 8.0* 8.1*  MG 1.7  --   --   PHOS 4.7*  --   --    Liver Function Tests: Recent Labs  Lab 11/09/18 0810  AST 12*  ALT 10  ALKPHOS 60  BILITOT 0.1*  PROT 5.8*  ALBUMIN 2.5*   Recent Labs  Lab 11/09/18 0810  LIPASE 55*   CBC: Recent Labs  Lab 11/09/18 0810 11/11/18 0426 11/12/18 0410  WBC 11.3* 6.4 7.7  NEUTROABS 7.0  --   --   HGB 9.8* 8.0* 9.3*  HCT 31.9* 25.7* 30.0*  MCV  90.6 92.8 92.0  PLT 428* 316 430*   Cardiac Enzymes: Recent Labs  Lab 11/09/18 0810  CKTOTAL 77  TROPONINI <0.03   CBG: Recent Labs  Lab 11/10/18 2113  GLUCAP 91    Signed:  Vassie Loll MD.  [facility] 11/12/2018, 8:57 AM

## 2019-01-06 ENCOUNTER — Encounter (HOSPITAL_COMMUNITY): Payer: Self-pay

## 2019-01-06 ENCOUNTER — Other Ambulatory Visit: Payer: Self-pay

## 2019-01-06 ENCOUNTER — Inpatient Hospital Stay (HOSPITAL_COMMUNITY)
Admission: EM | Admit: 2019-01-06 | Discharge: 2019-01-08 | DRG: 388 | Discharge status: Against Medical Advice | Payer: Medicare PPO | Attending: Internal Medicine | Admitting: Internal Medicine

## 2019-01-06 ENCOUNTER — Emergency Department (HOSPITAL_COMMUNITY): Payer: Medicare PPO

## 2019-01-06 DIAGNOSIS — G894 Chronic pain syndrome: Secondary | ICD-10-CM | POA: Diagnosis present

## 2019-01-06 DIAGNOSIS — B1920 Unspecified viral hepatitis C without hepatic coma: Secondary | ICD-10-CM | POA: Diagnosis present

## 2019-01-06 DIAGNOSIS — K56609 Unspecified intestinal obstruction, unspecified as to partial versus complete obstruction: Principal | ICD-10-CM | POA: Diagnosis present

## 2019-01-06 DIAGNOSIS — F1020 Alcohol dependence, uncomplicated: Secondary | ICD-10-CM | POA: Diagnosis present

## 2019-01-06 DIAGNOSIS — Z681 Body mass index (BMI) 19 or less, adult: Secondary | ICD-10-CM

## 2019-01-06 DIAGNOSIS — Z720 Tobacco use: Secondary | ICD-10-CM | POA: Diagnosis present

## 2019-01-06 DIAGNOSIS — Z79899 Other long term (current) drug therapy: Secondary | ICD-10-CM

## 2019-01-06 DIAGNOSIS — E43 Unspecified severe protein-calorie malnutrition: Secondary | ICD-10-CM | POA: Diagnosis present

## 2019-01-06 DIAGNOSIS — K219 Gastro-esophageal reflux disease without esophagitis: Secondary | ICD-10-CM | POA: Diagnosis present

## 2019-01-06 DIAGNOSIS — Z888 Allergy status to other drugs, medicaments and biological substances status: Secondary | ICD-10-CM

## 2019-01-06 DIAGNOSIS — E871 Hypo-osmolality and hyponatremia: Secondary | ICD-10-CM | POA: Diagnosis present

## 2019-01-06 DIAGNOSIS — Z01818 Encounter for other preprocedural examination: Secondary | ICD-10-CM

## 2019-01-06 DIAGNOSIS — F329 Major depressive disorder, single episode, unspecified: Secondary | ICD-10-CM | POA: Diagnosis present

## 2019-01-06 DIAGNOSIS — I10 Essential (primary) hypertension: Secondary | ICD-10-CM | POA: Diagnosis present

## 2019-01-06 DIAGNOSIS — D638 Anemia in other chronic diseases classified elsewhere: Secondary | ICD-10-CM | POA: Diagnosis present

## 2019-01-06 DIAGNOSIS — Z8249 Family history of ischemic heart disease and other diseases of the circulatory system: Secondary | ICD-10-CM

## 2019-01-06 DIAGNOSIS — I700 Atherosclerosis of aorta: Secondary | ICD-10-CM | POA: Diagnosis present

## 2019-01-06 DIAGNOSIS — M549 Dorsalgia, unspecified: Secondary | ICD-10-CM | POA: Diagnosis present

## 2019-01-06 DIAGNOSIS — K7210 Chronic hepatic failure without coma: Secondary | ICD-10-CM | POA: Diagnosis present

## 2019-01-06 DIAGNOSIS — F1010 Alcohol abuse, uncomplicated: Secondary | ICD-10-CM | POA: Diagnosis present

## 2019-01-06 DIAGNOSIS — F17210 Nicotine dependence, cigarettes, uncomplicated: Secondary | ICD-10-CM | POA: Diagnosis present

## 2019-01-06 DIAGNOSIS — K7460 Unspecified cirrhosis of liver: Secondary | ICD-10-CM | POA: Diagnosis present

## 2019-01-06 LAB — CBC WITH DIFFERENTIAL/PLATELET
Abs Immature Granulocytes: 0.06 10*3/uL (ref 0.00–0.07)
Basophils Absolute: 0 10*3/uL (ref 0.0–0.1)
Basophils Relative: 0 %
Eosinophils Absolute: 0 10*3/uL (ref 0.0–0.5)
Eosinophils Relative: 0 %
HCT: 34.9 % — ABNORMAL LOW (ref 39.0–52.0)
Hemoglobin: 11.2 g/dL — ABNORMAL LOW (ref 13.0–17.0)
Immature Granulocytes: 1 %
Lymphocytes Relative: 14 %
Lymphs Abs: 1.7 10*3/uL (ref 0.7–4.0)
MCH: 28.1 pg (ref 26.0–34.0)
MCHC: 32.1 g/dL (ref 30.0–36.0)
MCV: 87.5 fL (ref 80.0–100.0)
Monocytes Absolute: 0.7 10*3/uL (ref 0.1–1.0)
Monocytes Relative: 6 %
Neutro Abs: 9.4 10*3/uL — ABNORMAL HIGH (ref 1.7–7.7)
Neutrophils Relative %: 79 %
Platelets: 355 10*3/uL (ref 150–400)
RBC: 3.99 MIL/uL — ABNORMAL LOW (ref 4.22–5.81)
RDW: 15.9 % — ABNORMAL HIGH (ref 11.5–15.5)
WBC: 12 10*3/uL — ABNORMAL HIGH (ref 4.0–10.5)
nRBC: 0 % (ref 0.0–0.2)

## 2019-01-06 LAB — COMPREHENSIVE METABOLIC PANEL
ALT: 13 U/L (ref 0–44)
AST: 15 U/L (ref 15–41)
Albumin: 2.3 g/dL — ABNORMAL LOW (ref 3.5–5.0)
Alkaline Phosphatase: 51 U/L (ref 38–126)
Anion gap: 9 (ref 5–15)
BUN: 10 mg/dL (ref 6–20)
CO2: 28 mmol/L (ref 22–32)
Calcium: 8.6 mg/dL — ABNORMAL LOW (ref 8.9–10.3)
Chloride: 97 mmol/L — ABNORMAL LOW (ref 98–111)
Creatinine, Ser: 0.82 mg/dL (ref 0.61–1.24)
GFR calc Af Amer: >60 mL/min (ref >60)
GFR calc non Af Amer: >60 mL/min (ref >60)
Glucose, Bld: 138 mg/dL — ABNORMAL HIGH (ref 70–99)
Potassium: 4.7 mmol/L (ref 3.5–5.1)
Sodium: 134 mmol/L — ABNORMAL LOW (ref 135–145)
Total Bilirubin: 0.3 mg/dL (ref 0.3–1.2)
Total Protein: 5.8 g/dL — ABNORMAL LOW (ref 6.5–8.1)

## 2019-01-06 LAB — LIPASE, BLOOD: Lipase: 33 U/L (ref 11–51)

## 2019-01-06 MED ORDER — ONDANSETRON HCL 4 MG/2ML IJ SOLN
4.0000 mg | Freq: Once | INTRAMUSCULAR | Status: AC
  Administered 2019-01-06: 21:00:00 4 mg via INTRAVENOUS
  Filled 2019-01-06: qty 2

## 2019-01-06 MED ORDER — SODIUM CHLORIDE 0.9 % IV BOLUS
500.0000 mL | Freq: Once | INTRAVENOUS | Status: AC
  Administered 2019-01-06: 500 mL via INTRAVENOUS

## 2019-01-06 MED ORDER — MORPHINE SULFATE (PF) 4 MG/ML IV SOLN
4.0000 mg | Freq: Once | INTRAVENOUS | Status: AC
  Administered 2019-01-06: 21:00:00 4 mg via INTRAVENOUS
  Filled 2019-01-06: qty 1

## 2019-01-06 MED ORDER — IOHEXOL 300 MG/ML  SOLN
100.0000 mL | Freq: Once | INTRAMUSCULAR | Status: AC | PRN
  Administered 2019-01-06: 100 mL via INTRAVENOUS

## 2019-01-06 NOTE — ED Provider Notes (Signed)
Emergency Department Provider Note   I have reviewed the triage vital signs and the nursing notes.   HISTORY  Chief Complaint Abdominal Pain   HPI [name] is a 53 y.o. male with PMH of chronic pain, cirrhosis, Hep C, and recent admission for partial SBO presents to the emergency department for evaluation of worsening abdominal pain with nausea over the past 2 days.  Patient reports this pain is similar to his admission approximately 1 month ago.  He states he has had decreased stool output and is passing some flatus but feels it is reduced from normal.  He denies any fevers or chills.  No chest pain, shortness of breath, cough.  He does report significant nausea but no vomiting or diarrhea.  No blood in the bowel movements.  He does not have history of paracentesis.    Past Medical History:  Diagnosis Date   Acute renal failure (HCC)    Secondary to rhabdo. Treated with dialysis short-term.   Alcohol abuse    Psychiatric admissions for alcohol and drug abuse   Back pain, chronic    Chronic leg pain    Chronic pain syndrome    Cirrhosis (HCC)    Depression    Multiple psychiatric admissions   Hepatitis C    History of cocaine abuse (HCC)    Hyponatremia    Low TSH level January 2013   Normal free T4 of 1.06   Myositis    [facility]   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   Polysubstance abuse (HCC)    Rhabdomyolysis    Small bowel obstruction (HCC)    Tattoos    Thrombocytopenia (HCC)    Tobacco abuse     Patient Active Problem List   Diagnosis Date Noted   Severe protein-calorie malnutrition (HCC) 01/07/2019   History of alcohol use    Benign essential HTN    Depression    SBO (small bowel obstruction) (HCC) 11/09/2018   Chronic low back pain 09/22/2015   Bulging lumbar disc 09/22/2015   Thrombocytopenia (HCC) 09/22/2015   Chronic hepatitis C (HCC) 09/22/2015   Alcohol dependence (HCC) 06/30/2014    Polysubstance abuse (HCC) 05/02/2013   Nausea and vomiting in adult patient 05/02/2013   Generalized muscle ache 09/06/2012   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   Leukocytosis 09/06/2012   Muscle weakness (generalized) 09/06/2012   UTI (urinary tract infection) 09/06/2012   Hyperkalemia 05/28/2012   Rhabdomyolysis 05/28/2012   Acute renal failure (HCC) 05/28/2012   Hyponatremia 05/28/2012   Elevated liver enzymes 05/28/2012   Metabolic encephalopathy 12/11/2011   Overdose 12/11/2011   Encephalopathy 10/17/2011   Tachycardia 10/17/2011   Dehydration 10/17/2011   Hypernatremia 10/17/2011   Hepatitis C 10/17/2011   Elevated LFTs 10/17/2011   Alcohol abuse 10/17/2011   Alcohol intoxication (HCC) 10/17/2011   Muscular disease 10/17/2011   Chronic pain syndrome 10/17/2011   BACK PAIN 06/17/2010   Myalgia and myositis 06/17/2010   LEG PAIN, RIGHT 06/17/2010   Tobacco abuse 11/25/2009   HEPATITIS C 09/18/1996    Past Surgical History:  Procedure Laterality Date   APPENDECTOMY     HERNIA REPAIR     MUSCLE BIOPSY      Allergies Acetaminophen  Family History  Problem Relation Age of Onset   Hypertension Mother    Cancer Father        throat cancer    Diabetes Brother     Social History Social History   Tobacco Use  Smoking status: Current Every Day Smoker    Packs/day: 1.00    Years: 40.00    Pack years: 40.00    Types: Cigarettes   Smokeless tobacco: Never Used  Substance Use Topics   Alcohol use: Yes    Alcohol/week: 100.0 standard drinks    Types: 100 Cans of beer per week    Comment: daily   Drug use: Yes    Types: Cocaine, Marijuana    Review of Systems  Constitutional: No fever/chills Eyes: No visual changes. ENT: No sore throat. Cardiovascular: Denies chest pain. Respiratory: Denies shortness of breath. Gastrointestinal: Positive abdominal pain. Positive nausea, no vomiting.  No diarrhea.  No  constipation. Genitourinary: Negative for dysuria. Musculoskeletal: Negative for back pain. Skin: Negative for rash. Neurological: Negative for headaches, focal weakness or numbness.  10-point ROS otherwise negative.  ____________________________________________   PHYSICAL EXAM:  VITAL SIGNS: ED Triage Vitals  Enc Vitals Group     BP 01/06/19 2011 (!) 144/101     Pulse Rate 01/06/19 2010 (!) 125     Resp 01/06/19 2009 20     Temp 01/06/19 2009 99.3 F (37.4 C)     Temp Source 01/06/19 2009 Oral     SpO2 01/06/19 2010 97 %     Weight 01/06/19 2009 119 lb 11.4 oz (54.3 kg)     Height 01/06/19 2009 5\' 7"  (1.702 m)     Pain Score 01/06/19 2008 10   Constitutional: Alert and oriented. Well appearing and in no acute distress. Eyes: Conjunctivae are normal.  Head: Atraumatic. Nose: No congestion/rhinnorhea. Mouth/Throat: Mucous membranes are moist. Neck: No stridor.   Cardiovascular: Normal rate, regular rhythm. Good peripheral circulation. Grossly normal heart sounds.   Respiratory: Normal respiratory effort.  No retractions. Lungs CTAB. Gastrointestinal: Soft with diffuse moderate tenderness. No rebound or guarding. Mild distention.  Musculoskeletal: No lower extremity tenderness nor edema. No gross deformities of extremities. Neurologic:  Normal speech and language. No gross focal neurologic deficits are appreciated.  Skin:  Skin is warm, dry and intact. No rash noted.  ____________________________________________   LABS (all labs ordered are listed, but only abnormal results are displayed)  Labs Reviewed  COMPREHENSIVE METABOLIC PANEL - Abnormal; Notable for the following components:      Result Value   Sodium 134 (*)    Chloride 97 (*)    Glucose, Bld 138 (*)    Calcium 8.6 (*)    Total Protein 5.8 (*)    Albumin 2.3 (*)    All other components within normal limits  CBC WITH DIFFERENTIAL/PLATELET - Abnormal; Notable for the following components:   WBC 12.0 (*)     RBC 3.99 (*)    Hemoglobin 11.2 (*)    HCT 34.9 (*)    RDW 15.9 (*)    Neutro Abs 9.4 (*)    All other components within normal limits  COMPREHENSIVE METABOLIC PANEL - Abnormal; Notable for the following components:   Sodium 134 (*)    Glucose, Bld 100 (*)    Calcium 7.7 (*)    Total Protein 5.0 (*)    Albumin 2.0 (*)    AST 12 (*)    Total Bilirubin 0.1 (*)    All other components within normal limits  CBC - Abnormal; Notable for the following components:   RBC 3.48 (*)    Hemoglobin 9.8 (*)    HCT 32.2 (*)    RDW 16.2 (*)    All other components within normal limits  LIPASE, BLOOD  HIV ANTIBODY (ROUTINE TESTING W REFLEX)   ____________________________________________  RADIOLOGY  Ct Abdomen Pelvis W Contrast  Result Date: 01/06/2019 CLINICAL DATA:  Bowel obstruction.  Abdominal pain. EXAM: CT ABDOMEN AND PELVIS WITH CONTRAST TECHNIQUE: Multidetector CT imaging of the abdomen and pelvis was performed using the standard protocol following bolus administration of intravenous contrast. CONTRAST:  OMNIPAQUE IOHEXOL 300 MG/ML  SOLN COMPARISON:  11/09/2018 FINDINGS: Lower chest: Lung bases are clear. No effusions. Heart is normal size. Hepatobiliary: Small hypodensity in the left hepatic lobe compatible with small cysts. No suspicious focal hepatic abnormality. Gallbladder unremarkable. No biliary ductal dilatation. Pancreas: No focal abnormality or ductal dilatation. Spleen: No focal abnormality.  Normal size. Adrenals/Urinary Tract: No adrenal abnormality. No focal renal abnormality. No stones or hydronephrosis. Urinary bladder is unremarkable. Stomach/Bowel: There are dilated fluid-filled proximal and mid small bowel loops. Transition is noted in the pelvis near the midline where there is a thick walled small bowel loop with mucosal enhancement paddle with enteritis, infectious or inflammatory. Large bowel is decompressed and grossly unremarkable. Stomach unremarkable.  Vascular/Lymphatic: Aortic atherosclerosis. No enlarged abdominal or pelvic lymph nodes. Reproductive: No visible focal abnormality. Other: Small amount of free fluid in the pelvis and adjacent to the liver. Musculoskeletal: No acute bony abnormality. IMPRESSION: Small bowel obstruction with transition noted in the lower pelvis where there is and segment of small bowel with with thick wall and mucosal enhancement compatible with infectious or inflammatory enteritis. Small amount of free fluid in the abdomen and pelvis. Aortic atherosclerosis. Electronically Signed   By: Charlett Nose M.D.   On: 01/06/2019 23:50    ____________________________________________   PROCEDURES  Procedure(s) performed:   Procedures  None ____________________________________________   INITIAL IMPRESSION / ASSESSMENT AND PLAN / ED COURSE  Pertinent labs & imaging results that were available during my care of the patient were reviewed by me and considered in my medical decision making (see chart for details).   Patient presents to the emergency department for evaluation of diffuse abdominal discomfort with mild distention.  He was admitted last month with partial small bowel obstruction.  Has an associated history of liver cirrhosis.  No large distention. Doubt SBP. Plan for repeat labs, pain control, and CT.   Labs reviewed. CT pending. Care transferred to Dr. Bebe Shaggy pending CT. Anticipate admission for likely SBO.  ____________________________________________  FINAL CLINICAL IMPRESSION(S) / ED DIAGNOSES  Final diagnoses:  SBO (small bowel obstruction) (HCC)     MEDICATIONS GIVEN DURING THIS VISIT:  Medications  nicotine (NICODERM CQ - dosed in mg/24 hours) patch 21 mg (21 mg Transdermal Not Given 01/07/19 0813)  famotidine (PEPCID) IVPB 20 mg premix (20 mg Intravenous New Bag/Given 01/07/19 0303)  enoxaparin (LOVENOX) injection 40 mg (40 mg Subcutaneous Not Given 01/07/19 0202)  0.9 %  sodium chloride  infusion ( Intravenous New Bag/Given 01/07/19 0200)  thiamine (B-1) injection 100 mg (100 mg Intravenous Given 01/07/19 0807)  folic acid injection 1 mg (has no administration in time range)  LORazepam (ATIVAN) tablet 1 mg (has no administration in time range)    Or  LORazepam (ATIVAN) injection 1 mg (has no administration in time range)  LORazepam (ATIVAN) injection 0-4 mg (0 mg Intravenous Not Given 01/07/19 0202)    Followed by  LORazepam (ATIVAN) injection 0-4 mg (has no administration in time range)  metroNIDAZOLE (FLAGYL) IVPB 500 mg (500 mg Intravenous New Bag/Given 01/07/19 0807)  ondansetron (ZOFRAN) injection 4 mg (has no administration in time range)  morphine 2 MG/ML injection 1 mg (1 mg Intravenous Given 01/07/19 0649)  carvedilol (COREG) tablet 3.125 mg (0 mg Oral Hold 01/07/19 0810)  sodium chloride 0.9 % bolus 500 mL (0 mLs Intravenous Stopped 01/07/19 0025)  morphine 4 MG/ML injection 4 mg (4 mg Intravenous Given 01/06/19 2053)  ondansetron (ZOFRAN) injection 4 mg (4 mg Intravenous Given 01/06/19 2052)  iohexol (OMNIPAQUE) 300 MG/ML solution 100 mL (100 mLs Intravenous Contrast Given 01/06/19 2320)  morphine 4 MG/ML injection 4 mg (4 mg Intravenous Given 01/07/19 0023)  ondansetron (ZOFRAN) injection 4 mg (4 mg Intravenous Given 01/07/19 0023)    Note:  This document was prepared using Dragon voice recognition software and may include unintentional dictation errors.  Alona Bene, MD Emergency Medicine    Cayenne Breault, Arlyss Repress, MD 01/07/19 [phone]

## 2019-01-06 NOTE — ED Triage Notes (Signed)
Pt arrived via ems for abdominal pain 30 days. Denies n/v/d. Does have hx of SBO and cirrhosis

## 2019-01-07 ENCOUNTER — Encounter (HOSPITAL_COMMUNITY): Payer: Self-pay | Admitting: Internal Medicine

## 2019-01-07 ENCOUNTER — Inpatient Hospital Stay (HOSPITAL_COMMUNITY): Payer: Medicare PPO

## 2019-01-07 DIAGNOSIS — Z720 Tobacco use: Secondary | ICD-10-CM | POA: Diagnosis not present

## 2019-01-07 DIAGNOSIS — Z79899 Other long term (current) drug therapy: Secondary | ICD-10-CM | POA: Diagnosis not present

## 2019-01-07 DIAGNOSIS — E43 Unspecified severe protein-calorie malnutrition: Secondary | ICD-10-CM | POA: Diagnosis present

## 2019-01-07 DIAGNOSIS — K56609 Unspecified intestinal obstruction, unspecified as to partial versus complete obstruction: Secondary | ICD-10-CM | POA: Diagnosis present

## 2019-01-07 DIAGNOSIS — F1910 Other psychoactive substance abuse, uncomplicated: Secondary | ICD-10-CM | POA: Diagnosis not present

## 2019-01-07 DIAGNOSIS — I700 Atherosclerosis of aorta: Secondary | ICD-10-CM | POA: Diagnosis present

## 2019-01-07 DIAGNOSIS — Z8249 Family history of ischemic heart disease and other diseases of the circulatory system: Secondary | ICD-10-CM | POA: Diagnosis not present

## 2019-01-07 DIAGNOSIS — R000 Tachycardia, unspecified: Secondary | ICD-10-CM | POA: Diagnosis not present

## 2019-01-07 DIAGNOSIS — K7210 Chronic hepatic failure without coma: Secondary | ICD-10-CM | POA: Diagnosis present

## 2019-01-07 DIAGNOSIS — F17210 Nicotine dependence, cigarettes, uncomplicated: Secondary | ICD-10-CM | POA: Diagnosis present

## 2019-01-07 DIAGNOSIS — Z888 Allergy status to other drugs, medicaments and biological substances status: Secondary | ICD-10-CM | POA: Diagnosis not present

## 2019-01-07 DIAGNOSIS — B1710 Acute hepatitis C without hepatic coma: Secondary | ICD-10-CM | POA: Diagnosis not present

## 2019-01-07 DIAGNOSIS — F329 Major depressive disorder, single episode, unspecified: Secondary | ICD-10-CM | POA: Diagnosis present

## 2019-01-07 DIAGNOSIS — I10 Essential (primary) hypertension: Secondary | ICD-10-CM | POA: Diagnosis present

## 2019-01-07 DIAGNOSIS — F1020 Alcohol dependence, uncomplicated: Secondary | ICD-10-CM | POA: Diagnosis present

## 2019-01-07 DIAGNOSIS — G894 Chronic pain syndrome: Secondary | ICD-10-CM | POA: Diagnosis present

## 2019-01-07 DIAGNOSIS — D638 Anemia in other chronic diseases classified elsewhere: Secondary | ICD-10-CM | POA: Diagnosis not present

## 2019-01-07 DIAGNOSIS — E871 Hypo-osmolality and hyponatremia: Secondary | ICD-10-CM | POA: Diagnosis present

## 2019-01-07 DIAGNOSIS — Z681 Body mass index (BMI) 19 or less, adult: Secondary | ICD-10-CM | POA: Diagnosis not present

## 2019-01-07 DIAGNOSIS — F1010 Alcohol abuse, uncomplicated: Secondary | ICD-10-CM

## 2019-01-07 DIAGNOSIS — K7460 Unspecified cirrhosis of liver: Secondary | ICD-10-CM | POA: Diagnosis present

## 2019-01-07 DIAGNOSIS — B1920 Unspecified viral hepatitis C without hepatic coma: Secondary | ICD-10-CM | POA: Diagnosis present

## 2019-01-07 DIAGNOSIS — M549 Dorsalgia, unspecified: Secondary | ICD-10-CM | POA: Diagnosis present

## 2019-01-07 DIAGNOSIS — K219 Gastro-esophageal reflux disease without esophagitis: Secondary | ICD-10-CM | POA: Diagnosis present

## 2019-01-07 LAB — COMPREHENSIVE METABOLIC PANEL
ALT: 11 U/L (ref 0–44)
AST: 12 U/L — ABNORMAL LOW (ref 15–41)
Albumin: 2 g/dL — ABNORMAL LOW (ref 3.5–5.0)
Alkaline Phosphatase: 45 U/L (ref 38–126)
Anion gap: 6 (ref 5–15)
BUN: 11 mg/dL (ref 6–20)
CO2: 26 mmol/L (ref 22–32)
Calcium: 7.7 mg/dL — ABNORMAL LOW (ref 8.9–10.3)
Chloride: 102 mmol/L (ref 98–111)
Creatinine, Ser: 0.8 mg/dL (ref 0.61–1.24)
GFR calc Af Amer: >60 mL/min (ref >60)
GFR calc non Af Amer: >60 mL/min (ref >60)
Glucose, Bld: 100 mg/dL — ABNORMAL HIGH (ref 70–99)
Potassium: 4.8 mmol/L (ref 3.5–5.1)
Sodium: 134 mmol/L — ABNORMAL LOW (ref 135–145)
Total Bilirubin: 0.1 mg/dL — ABNORMAL LOW (ref 0.3–1.2)
Total Protein: 5 g/dL — ABNORMAL LOW (ref 6.5–8.1)

## 2019-01-07 LAB — CBC
HCT: 32.2 % — ABNORMAL LOW (ref 39.0–52.0)
Hemoglobin: 9.8 g/dL — ABNORMAL LOW (ref 13.0–17.0)
MCH: 28.2 pg (ref 26.0–34.0)
MCHC: 30.4 g/dL (ref 30.0–36.0)
MCV: 92.5 fL (ref 80.0–100.0)
Platelets: 317 10*3/uL (ref 150–400)
RBC: 3.48 MIL/uL — ABNORMAL LOW (ref 4.22–5.81)
RDW: 16.2 % — ABNORMAL HIGH (ref 11.5–15.5)
WBC: 7.6 10*3/uL (ref 4.0–10.5)
nRBC: 0 % (ref 0.0–0.2)

## 2019-01-07 MED ORDER — METRONIDAZOLE IN NACL 5-0.79 MG/ML-% IV SOLN
500.0000 mg | Freq: Three times a day (TID) | INTRAVENOUS | Status: DC
  Administered 2019-01-07 (×2): 500 mg via INTRAVENOUS
  Filled 2019-01-07 (×2): qty 100

## 2019-01-07 MED ORDER — CARVEDILOL 3.125 MG PO TABS
3.1250 mg | ORAL_TABLET | Freq: Two times a day (BID) | ORAL | Status: DC
  Administered 2019-01-07: 17:00:00 3.125 mg via ORAL
  Filled 2019-01-07 (×2): qty 1

## 2019-01-07 MED ORDER — FAMOTIDINE IN NACL 20-0.9 MG/50ML-% IV SOLN
20.0000 mg | Freq: Two times a day (BID) | INTRAVENOUS | Status: DC
  Administered 2019-01-07 (×3): 20 mg via INTRAVENOUS
  Filled 2019-01-07 (×3): qty 50

## 2019-01-07 MED ORDER — ONDANSETRON HCL 4 MG/2ML IJ SOLN: 4.0000 mg | Freq: Four times a day (QID) | INTRAMUSCULAR | Status: DC | PRN

## 2019-01-07 MED ORDER — SODIUM CHLORIDE 0.9 % IV SOLN
INTRAVENOUS | Status: DC
  Administered 2019-01-07: 02:00:00 via INTRAVENOUS

## 2019-01-07 MED ORDER — LORAZEPAM 1 MG PO TABS: 1.0000 mg | ORAL_TABLET | Freq: Four times a day (QID) | ORAL | Status: DC | PRN

## 2019-01-07 MED ORDER — MORPHINE SULFATE (PF) 2 MG/ML IV SOLN
1.0000 mg | INTRAVENOUS | Status: DC | PRN
  Administered 2019-01-07 (×2): 1 mg via INTRAVENOUS
  Filled 2019-01-07 (×2): qty 1

## 2019-01-07 MED ORDER — DEXTROSE-NACL 5-0.9 % IV SOLN
INTRAVENOUS | Status: DC
  Administered 2019-01-07: 16:00:00 via INTRAVENOUS

## 2019-01-07 MED ORDER — MORPHINE SULFATE (PF) 4 MG/ML IV SOLN
4.0000 mg | Freq: Once | INTRAVENOUS | Status: AC
  Administered 2019-01-07: 4 mg via INTRAVENOUS
  Filled 2019-01-07: qty 1

## 2019-01-07 MED ORDER — FOLIC ACID 5 MG/ML IJ SOLN
INTRAMUSCULAR | Status: AC
  Filled 2019-01-07: qty 0.2

## 2019-01-07 MED ORDER — LORAZEPAM 2 MG/ML IJ SOLN: 0.0000 mg | Freq: Four times a day (QID) | INTRAMUSCULAR | Status: DC

## 2019-01-07 MED ORDER — FOLIC ACID 5 MG/ML IJ SOLN
1.0000 mg | Freq: Every day | INTRAMUSCULAR | Status: DC
  Administered 2019-01-07: 13:00:00 1 mg via INTRAVENOUS
  Filled 2019-01-07 (×2): qty 0.2

## 2019-01-07 MED ORDER — LORAZEPAM 2 MG/ML IJ SOLN: 0.0000 mg | Freq: Two times a day (BID) | INTRAMUSCULAR | Status: DC

## 2019-01-07 MED ORDER — ENOXAPARIN SODIUM 40 MG/0.4ML ~~LOC~~ SOLN: 40.0000 mg | SUBCUTANEOUS | Status: DC

## 2019-01-07 MED ORDER — LORAZEPAM 2 MG/ML IJ SOLN: 1.0000 mg | Freq: Four times a day (QID) | INTRAMUSCULAR | Status: DC | PRN

## 2019-01-07 MED ORDER — MORPHINE SULFATE (PF) 2 MG/ML IV SOLN
2.0000 mg | INTRAVENOUS | Status: DC | PRN
  Administered 2019-01-07 (×5): 2 mg via INTRAVENOUS
  Filled 2019-01-07 (×5): qty 1

## 2019-01-07 MED ORDER — THIAMINE HCL 100 MG/ML IJ SOLN
100.0000 mg | Freq: Every day | INTRAMUSCULAR | Status: DC
  Administered 2019-01-07: 100 mg via INTRAVENOUS
  Filled 2019-01-07: qty 2

## 2019-01-07 MED ORDER — NICOTINE 21 MG/24HR TD PT24
21.0000 mg | MEDICATED_PATCH | Freq: Every day | TRANSDERMAL | Status: DC
  Filled 2019-01-07: qty 1

## 2019-01-07 MED ORDER — ONDANSETRON HCL 4 MG/2ML IJ SOLN
4.0000 mg | Freq: Once | INTRAMUSCULAR | Status: AC
  Administered 2019-01-07: 4 mg via INTRAVENOUS
  Filled 2019-01-07: qty 2

## 2019-01-07 NOTE — Progress Notes (Signed)
PROGRESS NOTE    Ryan MontaneJames W Kimball  ZOX:[id number] DOB: [birth year] DOA: 01/06/2019 PCP: Patient, No Pcp Per    Brief Narrative:  53 year old male who presented with abdominal pain.  He does have significant past medical history of prior small bowel obstructions, GERD, cirrhosis, hepatitis C, alcohol and tobacco abuse.  Patient reported 5 days of worsening abdominal pain, associated with abdominal distention, no diarrhea, no vomiting, no fevers.  On his initial physical examination his blood pressure was 134/95, heart rate 115, temperature 99.3, respiratory rate 20, oxygen saturation 96%, his lungs are clear to auscultation bilaterally, heart S1-S2 present and rhythmic, his abdomen was distended but soft, no guarding, positive bowel sounds.  His sodium was 134 potassium 4.7, chloride 97, bicarb 28, glucose 138, BUN 10, creatinine 0.82, AST 15, ALT 13, white count 12.0, hemoglobin 11.2, hematocrit 34.9, platelets 355.  His abdominal CT showed small bowel obstruction with transition noted in the lower pelvis.  His chest radiograph had hyperinflation, no infiltrates.  His EKG had 91 bpm, normal intervals, left axis, no significant ST segment or T wave changes, poor R wave progression.  Patient was admitted to the hospital working diagnosis acute small bowel obstruction.   Assessment & Plan:   Principal Problem:   SBO (small bowel obstruction) (HCC) Active Problems:   Tobacco abuse   Alcohol abuse   Hyponatremia   Severe protein-calorie malnutrition (HCC)  1. Small bowel obstruction. Improved abdominal pain, no nausea or vomiting. Patient declined NG tube. Will continue conservative medical therapy with IV fluids (D5NS), IV antiacids, IV analgesics and as needed antiemetics. Ok for ice chips. Case discussed with Dr. Lovell SheehanJenkins from surgery.   2. Chronic liver failure with cirrhosis. No clinical signs of decompensated disease. No ascites or encephalopathy. Will continue conservative medical management. Will  check INR in am.  3. Tobacco and alcohol abuse. No clinical signs of withdrawal, continue neuro checks per unit protocol. Will hold on benzodiazepines for now.   4. Severe calorie protein malnutrition. Continue patient npo for now, will need outpatient follow up.   5. HTN. Continue carvedilol.   DVT prophylaxis: enoxaparin  Code Status:  full Family Communication: no family at the bedside  Disposition Plan/ discharge barriers: pending clinical improvement.   Body mass index is 19.2 kg/m. Malnutrition Type:      Malnutrition Characteristics:      Nutrition Interventions:     RN Pressure Injury Documentation:     Consultants:   Surgery   Procedures:     Antimicrobials:       Subjective: Patient is feeling better but not yet back to baseline, continue to have abdominal pain to palpation, no nausea or vomiting. Improved appetite.   Objective: Vitals:   01/07/19 0000 01/07/19 0132 01/07/19 0554 01/07/19 0746  BP: (!) 134/95 (!) 129/96 (!) 131/91   Pulse: (!) 115 (!) 123 94   Resp:  16 18   Temp:  98.3 F (36.8 C) 98.4 F (36.9 C)   TempSrc:  Oral Oral   SpO2: 96% 100% 99% 98%  Weight:  55.6 kg    Height:  5\' 7"  (1.702 m)      Intake/Output Summary (Last 24 hours) at 01/07/2019 1144 Last data filed at 01/07/2019 0814 Gross per 24 hour  Intake 426.91 ml  Output -  Net 426.91 ml   Filed Weights   01/06/19 2009 01/07/19 0132  Weight: 54.3 kg 55.6 kg    Examination:   General: deconditioned  Neurology: Awake and alert,  non focal  E ENT: positive pallor, with mild icterus, oral mucosa moist Cardiovascular: No JVD. S1-S2 present, rhythmic, no gallops, rubs, or murmurs. No lower extremity edema. Pulmonary: positive breath sounds bilaterally, adequate air movement, no wheezing, rhonchi or rales. Gastrointestinal. Abdomen with no distention, no organomegaly, positive tenderness to deep palpation at the lower quadrants more on the right, no rebound or  guarding Skin. No rashes Musculoskeletal: no joint deformities     Data Reviewed: I have personally reviewed following labs and imaging studies  CBC: Recent Labs  Lab 01/06/19 2056 01/07/19 0711  WBC 12.0* 7.6  NEUTROABS 9.4*  --   HGB 11.2* 9.8*  HCT 34.9* 32.2*  MCV 87.5 92.5  PLT 355 317   Basic Metabolic Panel: Recent Labs  Lab 01/06/19 2056 01/07/19 0711  NA 134* 134*  K 4.7 4.8  CL 97* 102  CO2 28 26  GLUCOSE 138* 100*  BUN 10 11  CREATININE 0.82 0.80  CALCIUM 8.6* 7.7*   GFR: Estimated Creatinine Clearance: 85.9 mL/min (by C-G formula based on SCr of 0.8 mg/dL). Liver Function Tests: Recent Labs  Lab 01/06/19 2056 01/07/19 0711  AST 15 12*  ALT 13 11  ALKPHOS 51 45  BILITOT 0.3 0.1*  PROT 5.8* 5.0*  ALBUMIN 2.3* 2.0*   Recent Labs  Lab 01/06/19 2056  LIPASE 33   No results for input(s): AMMONIA in the last 168 hours. Coagulation Profile: No results for input(s): INR, PROTIME in the last 168 hours. Cardiac Enzymes: No results for input(s): CKTOTAL, CKMB, CKMBINDEX, TROPONINI in the last 168 hours. BNP (last 3 results) No results for input(s): PROBNP in the last 8760 hours. HbA1C: No results for input(s): HGBA1C in the last 72 hours. CBG: No results for input(s): GLUCAP in the last 168 hours. Lipid Profile: No results for input(s): CHOL, HDL, LDLCALC, TRIG, CHOLHDL, LDLDIRECT in the last 72 hours. Thyroid Function Tests: No results for input(s): TSH, T4TOTAL, FREET4, T3FREE, THYROIDAB in the last 72 hours. Anemia Panel: No results for input(s): VITAMINB12, FOLATE, FERRITIN, TIBC, IRON, RETICCTPCT in the last 72 hours.    Radiology Studies: I have reviewed all of the imaging during this hospital visit personally     Scheduled Meds: . carvedilol  3.125 mg Oral BID WC  . enoxaparin (LOVENOX) injection  40 mg Subcutaneous Q24H  . folic acid  1 mg Intravenous Daily  . LORazepam  0-4 mg Intravenous Q6H   Followed by  . [START ON  01/09/2019] LORazepam  0-4 mg Intravenous Q12H  . nicotine  21 mg Transdermal Daily  . thiamine injection  100 mg Intravenous Daily   Continuous Infusions: . sodium chloride 100 mL/hr at 01/07/19 0200  . famotidine (PEPCID) IV 20 mg (01/07/19 0957)  . metronidazole 500 mg (01/07/19 0807)     LOS: 0 days        Valyn Latchford Annett Gula, MD

## 2019-01-07 NOTE — TOC Initial Note (Signed)
Transition of Care [facility]) - Initial/Assessment Note    Patient Details  Name: [name] MRN: [id number] Date of Birth: 07-Jun-1966  Transition of Care [facility]) CM/SW Contact:    Malcolm Metro, RN Phone Number: 01/07/2019, 12:26 PM  Clinical Narrative:         CM assessment d/t high readmission risk score. Pt has insurance and PCP (DonDiego) whom he saw 2 weeks ago. Pt does not have a care but gets transportation from brother or neighbor. No TOC needs noted nor communicated at this time. CM may be consulted if needs arise.           Expected Discharge Plan: Home/Self Care       Expected Discharge Plan and Services Expected Discharge Plan: Home/Self Care            Do you feel safe going back to the place where you live?: Yes          Activities of Daily Living Home Assistive Devices/Equipment: None ADL Screening (condition at time of admission) Patient's cognitive ability adequate to safely complete daily activities?: Yes Is the patient deaf or have difficulty hearing?: No Does the patient have difficulty seeing, even when wearing glasses/contacts?: No Does the patient have difficulty concentrating, remembering, or making decisions?: No Patient able to express need for assistance with ADLs?: Yes Does the patient have difficulty dressing or bathing?: No Independently performs ADLs?: Yes (appropriate for developmental age) Does the patient have difficulty walking or climbing stairs?: No Weakness of Legs: None Weakness of Arms/Hands: None     Emotional Assessment Appearance:: Appears stated age   Affect (typically observed): Accepting, Agitated Orientation: : Oriented to Self, Oriented to Place, Oriented to  Time, Oriented to Situation      Admission diagnosis:  SBO (small bowel obstruction) (HCC) [K56.609] Patient Active Problem List   Diagnosis Date Noted  . Severe protein-calorie malnutrition (HCC) 01/07/2019  . History of alcohol use   . Benign  essential HTN   . Depression   . SBO (small bowel obstruction) (HCC) 11/09/2018  . Chronic low back pain 09/22/2015  . Bulging lumbar disc 09/22/2015  . Thrombocytopenia (HCC) 09/22/2015  . Chronic hepatitis C (HCC) 09/22/2015  . Alcohol dependence (HCC) 06/30/2014  . Polysubstance abuse (HCC) 05/02/2013  . Nausea and vomiting in adult patient 05/02/2013  . Generalized muscle ache 09/06/2012  . 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 Leukocytosis 09/06/2012  . Muscle weakness (generalized) 09/06/2012  . UTI (urinary tract infection) 09/06/2012  . Hyperkalemia 05/28/2012  . Rhabdomyolysis 05/28/2012  . Acute renal failure (HCC) 05/28/2012  . Hyponatremia 05/28/2012  . Elevated liver enzymes 05/28/2012  . Metabolic encephalopathy 12/11/2011  . Overdose 12/11/2011  . Encephalopathy 10/17/2011  . Tachycardia 10/17/2011  . Dehydration 10/17/2011  . Hypernatremia 10/17/2011  . Hepatitis C 10/17/2011  . Elevated LFTs 10/17/2011  . Alcohol abuse 10/17/2011  . Alcohol intoxication (HCC) 10/17/2011  . Muscular disease 10/17/2011  . Chronic pain syndrome 10/17/2011  . BACK PAIN 06/17/2010  . Myalgia and myositis 06/17/2010  . LEG PAIN, RIGHT 06/17/2010  . Tobacco abuse 11/25/2009  . HEPATITIS C 09/18/1996   PCP:  Patient, No Pcp Per Pharmacy:   [id number] - [place], [place] - [address] [place] [address] Phone: [phone] Fax: [phone]  [facility] [address] [place] - [address] [place] [address] [place] [address] [place] [address] Phone: [phone] Fax: [phone]  [facility] - [place] - [address] [place] [address] Phone: [phone] Fax: [phone]     Social Determinants of Health (SDOH) Interventions    Readmission Risk Interventions Readmission Risk Prevention Plan 01/07/2019  Transportation Screening Complete  HRI or Home Care Consult  Complete  Social Work Consult for Recovery Care Planning/Counseling Complete  Palliative Care Screening Not Applicable  Medication Review Oceanographer(RN Care Manager) Complete  Some recent data might be hidden

## 2019-01-07 NOTE — Progress Notes (Signed)
Patient refusing to wear telemetry monitor. Patient stated "its getting in my way. Im not going to wear it." Patient education done and removed from telemetry.

## 2019-01-07 NOTE — Consult Note (Signed)
Reason for Consult: Small bowel obstruction Referring Physician: Dr. Tresea MallArrien  [name] is an 53 y.o. male.  HPI: Patient is a 53 year old white male with multiple medical problems who presents with worsening periumbilical abdominal pain and nausea.  He has had previous episodes of small bowel obstruction due to adhesive disease, recently being discharged from the hospital in February 2020.  He states that he started having the abdominal distention yesterday.  Did have a watery bowel movement he states.  He is passing flatus.  He denies any significant emesis.  He currently has a pain level of 3 out of 10.  The morphine is somewhat helpful.  Past Medical History:  Diagnosis Date  . Acute renal failure (HCC)    Secondary to rhabdo. Treated with dialysis short-term.  . Alcohol abuse    Psychiatric admissions for alcohol and drug abuse  . Back pain, chronic   . Chronic leg pain   . Chronic pain syndrome   . Cirrhosis (HCC)   . Depression    Multiple psychiatric admissions  . Hepatitis C   . History of cocaine abuse (HCC)   . Hyponatremia   . Low TSH level January 2013   Normal free T4 of 1.06  . Myositis    [facility].  . Polymyositis [facility](HCC) January 2011  . Polysubstance abuse (HCC)   . Rhabdomyolysis   . Small bowel obstruction (HCC)   . Tattoos   . Thrombocytopenia (HCC)   . Tobacco abuse     Past Surgical History:  Procedure Laterality Date  . APPENDECTOMY    . HERNIA REPAIR    . MUSCLE BIOPSY      Family History  Problem Relation Age of Onset  . Hypertension Mother   . Cancer Father        throat cancer   . Diabetes Brother     Social History:  reports that he has been smoking cigarettes. He has a 40.00 pack-year smoking history. He has never used smokeless tobacco. He reports current alcohol use of about 100.0 standard drinks of alcohol per week. He reports current drug use. Drugs: Cocaine and Marijuana.  Allergies:  Allergies  Allergen  Reactions  . Acetaminophen Other (See Comments)    Liver disease     Medications: I have reviewed the patient's current medications.  Results for orders placed or performed during the hospital encounter of 01/06/19 (from the past 48 hour(s))  Comprehensive metabolic panel     Status: Abnormal   Collection Time: 01/06/19  8:56 PM  Result Value Ref Range   Sodium 134 (L) 135 - 145 mmol/L   Potassium 4.7 3.5 - 5.1 mmol/L   Chloride 97 (L) 98 - 111 mmol/L   CO2 28 22 - 32 mmol/L   Glucose, Bld 138 (H) 70 - 99 mg/dL   BUN 10 6 - 20 mg/dL   Creatinine, Ser 1.470.82 0.61 - 1.24 mg/dL   Calcium 8.6 (L) 8.9 - 10.3 mg/dL   Total Protein 5.8 (L) 6.5 - 8.1 g/dL   Albumin 2.3 (L) 3.5 - 5.0 g/dL   AST 15 15 - 41 U/L   ALT 13 0 - 44 U/L   Alkaline Phosphatase 51 38 - 126 U/L   Total Bilirubin 0.3 0.3 - 1.2 mg/dL   GFR calc non Af Amer >60 >60 mL/min   GFR calc Af Amer >60 >60 mL/min   Anion gap 9 5 - 15    Comment: Performed at  [facility], [address], [place], [place] [address]  Lipase, blood     Status: None   Collection Time: 01/06/19  8:56 PM  Result Value Ref Range   Lipase 33 11 - 51 U/L    Comment: Performed at [facility], [address] [place], [place] [address]  CBC with Differential     Status: Abnormal   Collection Time: 01/06/19  8:56 PM  Result Value Ref Range   WBC 12.0 (H) 4.0 - 10.5 K/uL   RBC 3.99 (L) 4.22 - 5.81 MIL/uL   Hemoglobin 11.2 (L) 13.0 - 17.0 g/dL   HCT 31.5 (L) 17.6 - 16.0 %   MCV 87.5 80.0 - 100.0 fL   MCH 28.1 26.0 - 34.0 pg   MCHC 32.1 30.0 - 36.0 g/dL   RDW 73.7 (H) 10.6 - 26.9 %   Platelets 355 150 - 400 K/uL   nRBC 0.0 0.0 - 0.2 %   Neutrophils Relative % 79 %   Neutro Abs 9.4 (H) 1.7 - 7.7 K/uL   Lymphocytes Relative 14 %   Lymphs Abs 1.7 0.7 - 4.0 K/uL   Monocytes Relative 6 %   Monocytes Absolute 0.7 0.1 - 1.0 K/uL   Eosinophils Relative 0 %   Eosinophils Absolute 0.0 0.0 - 0.5 K/uL   Basophils Relative 0 %   Basophils Absolute 0.0 0.0  - 0.1 K/uL   Immature Granulocytes 1 %   Abs Immature Granulocytes 0.06 0.00 - 0.07 K/uL    Comment: Performed at [facility], [address], [place], [place] [address]  Comprehensive metabolic panel     Status: Abnormal   Collection Time: 01/07/19  7:11 AM  Result Value Ref Range   Sodium 134 (L) 135 - 145 mmol/L   Potassium 4.8 3.5 - 5.1 mmol/L   Chloride 102 98 - 111 mmol/L   CO2 26 22 - 32 mmol/L   Glucose, Bld 100 (H) 70 - 99 mg/dL   BUN 11 6 - 20 mg/dL   Creatinine, Ser 2.70 0.61 - 1.24 mg/dL   Calcium 7.7 (L) 8.9 - 10.3 mg/dL   Total Protein 5.0 (L) 6.5 - 8.1 g/dL   Albumin 2.0 (L) 3.5 - 5.0 g/dL   AST 12 (L) 15 - 41 U/L   ALT 11 0 - 44 U/L   Alkaline Phosphatase 45 38 - 126 U/L   Total Bilirubin 0.1 (L) 0.3 - 1.2 mg/dL   GFR calc non Af Amer >60 >60 mL/min   GFR calc Af Amer >60 >60 mL/min   Anion gap 6 5 - 15    Comment: Performed at [facility], [address] [place] [address], [place], [place] [address]  CBC     Status: Abnormal   Collection Time: 01/07/19  7:11 AM  Result Value Ref Range   WBC 7.6 4.0 - 10.5 K/uL   RBC 3.48 (L) 4.22 - 5.81 MIL/uL   Hemoglobin 9.8 (L) 13.0 - 17.0 g/dL   HCT 38.1 (L) 82.9 - 93.7 %   MCV 92.5 80.0 - 100.0 fL   MCH 28.2 26.0 - 34.0 pg   MCHC 30.4 30.0 - 36.0 g/dL   RDW 16.9 (H) 67.8 - 93.8 %   Platelets 317 150 - 400 K/uL   nRBC 0.0 0.0 - 0.2 %    Comment: Performed at [facility], [address], [place], [place] [address]    Ct Abdomen Pelvis W Contrast  Result Date: 01/06/2019 CLINICAL DATA:  Bowel obstruction.  Abdominal pain.  EXAM: CT ABDOMEN AND PELVIS WITH CONTRAST TECHNIQUE: Multidetector CT imaging of the abdomen and pelvis was performed using the standard protocol following bolus administration of intravenous contrast. CONTRAST:  OMNIPAQUE IOHEXOL 300 MG/ML  SOLN COMPARISON:  11/09/2018 FINDINGS: Lower chest: Lung bases are clear. No effusions. Heart is normal size. Hepatobiliary: Small hypodensity in the left hepatic lobe  compatible with small cysts. No suspicious focal hepatic abnormality. Gallbladder unremarkable. No biliary ductal dilatation. Pancreas: No focal abnormality or ductal dilatation. Spleen: No focal abnormality.  Normal size. Adrenals/Urinary Tract: No adrenal abnormality. No focal renal abnormality. No stones or hydronephrosis. Urinary bladder is unremarkable. Stomach/Bowel: There are dilated fluid-filled proximal and mid small bowel loops. Transition is noted in the pelvis near the midline where there is a thick walled small bowel loop with mucosal enhancement paddle with enteritis, infectious or inflammatory. Large bowel is decompressed and grossly unremarkable. Stomach unremarkable. Vascular/Lymphatic: Aortic atherosclerosis. No enlarged abdominal or pelvic lymph nodes. Reproductive: No visible focal abnormality. Other: Small amount of free fluid in the pelvis and adjacent to the liver. Musculoskeletal: No acute bony abnormality. IMPRESSION: Small bowel obstruction with transition noted in the lower pelvis where there is and segment of small bowel with with thick wall and mucosal enhancement compatible with infectious or inflammatory enteritis. Small amount of free fluid in the abdomen and pelvis. Aortic atherosclerosis. Electronically Signed   By: Charlett Nose M.D.   On: 01/06/2019 23:50    ROS:  Pertinent items are noted in HPI.  Blood pressure (!) 131/91, pulse 94, temperature 98.4 F (36.9 C), temperature source Oral, resp. rate 18, height  (1.702 m), weight 55.6 kg, SpO2 98 %. Physical Exam: Well-developed well-nourished white male no acute distress Head is normocephalic, atraumatic Lungs clear to auscultation with good breath sounds bilaterally Heart examination reveals a regular rate and rhythm without S3, S4, murmurs Abdomen is soft and not particularly distended.  Occasional bowel sounds are appreciated.  No rigidity is noted, but he does have nonspecific tenderness to palpation which is  migratory in nature.  CT scan images personally reviewed  Assessment/Plan: Impression: Recurrent small bowel obstruction in the pelvis.  There are some thickened small bowel loops with collapse past the transition zone.  This is similar to what was seen in February 2020. Plan: Patient has refused NG tube placement.  Will monitor over the next 24 to 48 hours.  He may require exploratory laparotomy as this is recurrent in nature.  Franky Macho 01/07/2019, 9:31 AM

## 2019-01-07 NOTE — H&P (Addendum)
TRH H&P    Patient Demographics:    [name], is a 53 y.o. male  MRN: [id number]  DOB - 04-Mar-1966  Admit Date - 01/06/2019  Referring MD/NP/PA: Ripley Fraise  Outpatient Primary MD for the patient is Patient, No Pcp Per Lucia Gaskins  Patient coming from:   home  Chief complaint- abdominal pain   HPI:    [name]  is a 53 y.o. male, w hx of SBO, (prior abdominal surgery as child for ? Tumor) , GERD, CIrrhosis/ Hepatitis C, hx of rhabdomyolysis/ ARF, ETOH abuse, Nicotine Dependence, recently admitted for SBO apparently c/o intermittent abdominal pain since discharge in February which has become more constant and intense over the past 5 days.  Pt denies fever, chills, n/v, diarrhea, brbpr, black stool.   In ED T 99.3  P 115  Bp 134/95  Pox 96% RA Wt 54.3kg  Na 134, K 4.7 Bun 10, Creatinine 0.82 Alb 2.3  Ast 15, Alt 13, Alk phos 51, T. Bili 0.3 Lipase 33 Wbc 12.0, Hgb 11.2, Plt 355  CT scan abd/ pelvis IMPRESSION: Small bowel obstruction with transition noted in the lower pelvis where there is and segment of small bowel with with thick wall and mucosal enhancement compatible with infectious or inflammatory enteritis.  Small amount of free fluid in the abdomen and pelvis.  Aortic atherosclerosis.  Pt will be admitted for SBO          Review of systems:    In addition to the HPI above,  No Fever-chills, No Headache, No changes with Vision or hearing, No problems swallowing food or Liquids, No Chest pain, Cough or Shortness of Breath,  No Nausea or Vomiting, bowel movements are regular, No Blood in stool or Urine, No dysuria, No new skin rashes or bruises, No new joints pains-aches,  No new weakness, tingling, numbness in any extremity, No recent weight gain or loss, No polyuria, polydypsia or polyphagia, No significant Mental Stressors.  All other systems reviewed  and are negative.    Past History of the following :    Past Medical History:  Diagnosis Date   Acute renal failure ([place])    Secondary to rhabdo. Treated with dialysis short-term.   Alcohol abuse    Psychiatric admissions for alcohol and drug abuse   Back pain, chronic    Chronic leg pain    Chronic pain syndrome    Cirrhosis ([place])    Depression    Multiple psychiatric admissions   Hepatitis C    History of cocaine abuse ([place])    Hyponatremia    Low TSH level January 2013   Normal free T4 of 1.06   Myositis    Left deltoid biopsy at [facility] 10/11/11.   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)    Rhabdomyolysis    Small bowel obstruction (HCC)    Tattoos    Thrombocytopenia (HCC)    Tobacco abuse       Past Surgical History:  Procedure Laterality Date   APPENDECTOMY  HERNIA REPAIR     MUSCLE BIOPSY        Social History:      Social History   Tobacco Use   Smoking status: Current Every Day Smoker    Packs/day: 1.00    Years: 40.00    Pack years: 40.00    Types: Cigarettes   Smokeless tobacco: Never Used  Substance Use Topics   Alcohol use: Yes    Alcohol/week: 100.0 standard drinks    Types: 100 Cans of beer per week    Comment: daily       Family History :     Family History  Problem Relation Age of Onset   Hypertension Mother    Cancer Father        throat cancer    Diabetes Brother        Home Medications:   Prior to Admission medications   Medication Sig Start Date End Date Taking? Authorizing Provider  pantoprazole (PROTONIX) 40 MG tablet Take 1 tablet (40 mg total) by mouth daily. 11/13/18  Yes Barton Dubois, MD  carvedilol (COREG) 6.25 MG tablet Take 1 tablet (6.25 mg total) by mouth 2 (two) times daily with a meal. 11/12/18   Barton Dubois, MD  diphenhydramine-acetaminophen (TYLENOL PM) 25-500 MG TABS tablet Take 1 tablet by mouth at bedtime as needed for pain.    [provider]  folic acid (FOLVITE) 1 MG tablet Take 1 tablet (1 mg total) by mouth daily. 11/13/18   Barton Dubois, MD  lactulose (CHRONULAC) 10 GM/15ML solution Take 15 mLs (10 g total) by mouth 2 (two) times daily as needed (to achieve 2-3 BM's daily; hold if having desired amount of BM's without use of lactulose.). 11/12/18   Barton Dubois, MD  mirtazapine (REMERON) 30 MG tablet Take 1 tablet (30 mg total) by mouth at bedtime. 11/12/18   Barton Dubois, MD  nicotine (NICODERM CQ - DOSED IN MG/24 HOURS) 21 mg/24hr patch Place 1 patch (21 mg total) onto the skin daily. 11/13/18   Barton Dubois, MD  thiamine 100 MG tablet Take 1 tablet (100 mg total) by mouth daily. 11/13/18   Barton Dubois, MD     Allergies:     Allergies  Allergen Reactions   Acetaminophen Other (See Comments)    Liver disease      Physical Exam:   Vitals  Blood pressure (!) 134/95, pulse (!) 115, temperature 99.3 F (37.4 C), temperature source Oral, resp. rate 20, height 5' 7"  (1.702 m), weight 54.3 kg, SpO2 96 %.  1.  General: axox3  2. Psychiatric: euthymic  3. Neurologic: cn2-12 intact, reflexes 2+ symmetric, diffuse with no clonus, motor 5/5 in all 4 ext  4. HEENMT:  Anicteric, pupils 1.71m symmetric, direct, consensual, near intact  5. Respiratory : CTAB  6. Cardiovascular : rrr s1, s2, no m/g/r  7. Gastrointestinal:  Abd: soft, minimal tenderness, no guarding, no rebound, +bs  8. Skin:  Ext: no c/c/e, no rash  9.Musculoskeletal:  Good ROM    Data Review:    CBC Recent Labs  Lab 01/06/19 2056  WBC 12.0*  HGB 11.2*  HCT 34.9*  PLT 355  MCV 87.5  MCH 28.1  MCHC 32.1  RDW 15.9*  LYMPHSABS 1.7  MONOABS 0.7  EOSABS 0.0  BASOSABS 0.0   ------------------------------------------------------------------------------------------------------------------  Results for orders placed or performed during the hospital encounter of 01/06/19 (from the past 48 hour(s))  Comprehensive metabolic  panel     Status: Abnormal  Collection Time: 01/06/19  8:56 PM  Result Value Ref Range   Sodium 134 (L) 135 - 145 mmol/L   Potassium 4.7 3.5 - 5.1 mmol/L   Chloride 97 (L) 98 - 111 mmol/L   CO2 28 22 - 32 mmol/L   Glucose, Bld 138 (H) 70 - 99 mg/dL   BUN 10 6 - 20 mg/dL   Creatinine, Ser 0.82 0.61 - 1.24 mg/dL   Calcium 8.6 (L) 8.9 - 10.3 mg/dL   Total Protein 5.8 (L) 6.5 - 8.1 g/dL   Albumin 2.3 (L) 3.5 - 5.0 g/dL   AST 15 15 - 41 U/L   ALT 13 0 - 44 U/L   Alkaline Phosphatase 51 38 - 126 U/L   Total Bilirubin 0.3 0.3 - 1.2 mg/dL   GFR calc non Af Amer >60 >60 mL/min   GFR calc Af Amer >60 >60 mL/min   Anion gap 9 5 - 15    Comment: Performed at [facility], [address] [place], [place] [address]  Lipase, blood     Status: None   Collection Time: 01/06/19  8:56 PM  Result Value Ref Range   Lipase 33 11 - 51 U/L    Comment: Performed at [facility], [address] [place], [place] [address]  CBC with Differential     Status: Abnormal   Collection Time: 01/06/19  8:56 PM  Result Value Ref Range   WBC 12.0 (H) 4.0 - 10.5 K/uL   RBC 3.99 (L) 4.22 - 5.81 MIL/uL   Hemoglobin 11.2 (L) 13.0 - 17.0 g/dL   HCT 34.9 (L) 39.0 - 52.0 %   MCV 87.5 80.0 - 100.0 fL   MCH 28.1 26.0 - 34.0 pg   MCHC 32.1 30.0 - 36.0 g/dL   RDW 15.9 (H) 11.5 - 15.5 %   Platelets 355 150 - 400 K/uL   nRBC 0.0 0.0 - 0.2 %   Neutrophils Relative % 79 %   Neutro Abs 9.4 (H) 1.7 - 7.7 K/uL   Lymphocytes Relative 14 %   Lymphs Abs 1.7 0.7 - 4.0 K/uL   Monocytes Relative 6 %   Monocytes Absolute 0.7 0.1 - 1.0 K/uL   Eosinophils Relative 0 %   Eosinophils Absolute 0.0 0.0 - 0.5 K/uL   Basophils Relative 0 %   Basophils Absolute 0.0 0.0 - 0.1 K/uL   Immature Granulocytes 1 %   Abs Immature Granulocytes 0.06 0.00 - 0.07 K/uL    Comment: Performed at [facility], [address], [place], [place] [address]    Chemistries  Recent Labs  Lab 01/06/19 2056  NA 134*  K 4.7  CL 97*  CO2 28  GLUCOSE  138*  BUN 10  CREATININE 0.82  CALCIUM 8.6*  AST 15  ALT 13  ALKPHOS 51  BILITOT 0.3   ------------------------------------------------------------------------------------------------------------------  ------------------------------------------------------------------------------------------------------------------ GFR: Estimated Creatinine Clearance: 81.9 mL/min (by C-G formula based on SCr of 0.82 mg/dL). Liver Function Tests: Recent Labs  Lab 01/06/19 2056  AST 15  ALT 13  ALKPHOS 51  BILITOT 0.3  PROT 5.8*  ALBUMIN 2.3*   Recent Labs  Lab 01/06/19 2056  LIPASE 33   No results for input(s): AMMONIA in the last 168 hours. Coagulation Profile: No results for input(s): INR, PROTIME in the last 168 hours. Cardiac Enzymes: No results for input(s): CKTOTAL, CKMB, CKMBINDEX, TROPONINI in the last 168 hours. BNP (last 3 results) No results for input(s): PROBNP in the last 8760 hours. HbA1C: No results  for input(s): HGBA1C in the last 72 hours. CBG: No results for input(s): GLUCAP in the last 168 hours. Lipid Profile: No results for input(s): CHOL, HDL, LDLCALC, TRIG, CHOLHDL, LDLDIRECT in the last 72 hours. Thyroid Function Tests: No results for input(s): TSH, T4TOTAL, FREET4, T3FREE, THYROIDAB in the last 72 hours. Anemia Panel: No results for input(s): VITAMINB12, FOLATE, FERRITIN, TIBC, IRON, RETICCTPCT in the last 72 hours.  --------------------------------------------------------------------------------------------------------------- Urine analysis:    Component Value Date/Time   COLORURINE YELLOW 11/09/2018 0752   APPEARANCEUR CLEAR 11/09/2018 0752   LABSPEC 1.011 11/09/2018 0752   PHURINE 6.0 11/09/2018 0752   GLUCOSEU NEGATIVE 11/09/2018 0752   HGBUR NEGATIVE 11/09/2018 0752   BILIRUBINUR NEGATIVE 11/09/2018 0752   KETONESUR NEGATIVE 11/09/2018 0752   PROTEINUR NEGATIVE 11/09/2018 0752   UROBILINOGEN 0.2 07/24/2014 1725   NITRITE NEGATIVE 11/09/2018  0752   LEUKOCYTESUR NEGATIVE 11/09/2018 0752      Imaging Results:    Ct Abdomen Pelvis W Contrast  Result Date: 01/06/2019 CLINICAL DATA:  Bowel obstruction.  Abdominal pain. EXAM: CT ABDOMEN AND PELVIS WITH CONTRAST TECHNIQUE: Multidetector CT imaging of the abdomen and pelvis was performed using the standard protocol following bolus administration of intravenous contrast. CONTRAST:  141m OMNIPAQUE IOHEXOL 300 MG/ML  SOLN COMPARISON:  11/09/2018 FINDINGS: Lower chest: Lung bases are clear. No effusions. Heart is normal size. Hepatobiliary: Small hypodensity in the left hepatic lobe compatible with small cysts. No suspicious focal hepatic abnormality. Gallbladder unremarkable. No biliary ductal dilatation. Pancreas: No focal abnormality or ductal dilatation. Spleen: No focal abnormality.  Normal size. Adrenals/Urinary Tract: No adrenal abnormality. No focal renal abnormality. No stones or hydronephrosis. Urinary bladder is unremarkable. Stomach/Bowel: There are dilated fluid-filled proximal and mid small bowel loops. Transition is noted in the pelvis near the midline where there is a thick walled small bowel loop with mucosal enhancement paddle with enteritis, infectious or inflammatory. Large bowel is decompressed and grossly unremarkable. Stomach unremarkable. Vascular/Lymphatic: Aortic atherosclerosis. No enlarged abdominal or pelvic lymph nodes. Reproductive: No visible focal abnormality. Other: Small amount of free fluid in the pelvis and adjacent to the liver. Musculoskeletal: No acute bony abnormality. IMPRESSION: Small bowel obstruction with transition noted in the lower pelvis where there is and segment of small bowel with with thick wall and mucosal enhancement compatible with infectious or inflammatory enteritis. Small amount of free fluid in the abdomen and pelvis. Aortic atherosclerosis. Electronically Signed   By: KRolm BaptiseM.D.   On: 01/06/2019 23:50       Assessment & Plan:     Principal Problem:   SBO (small bowel obstruction) (HCC) Active Problems:   Tobacco abuse   Alcohol abuse  Abdominal pain secondary to SBO, ddx enteritis NPO NGT Surgery consulted NS iv Flagyl 5077miv tid Check cbc, cmp in AM  Nicotine dep Nicotine patch 21 mg topically qday  Hx of ETOH abuse CIWA  Severe protein calorie malnutrion Please start on prostat once NPO lifted  Gerd Protonix=> pepcid 2065mv bid  Hypertension Pt denies being on any bp medication Will restart on carvedilol 3.125m8m bid  DVT Prophylaxis-   Lovenox - SCDs   AM Labs Ordered, also please review Full Orders  Family Communication: Admission, patients condition and plan of care including tests being ordered have been discussed with the patient  who indicate understanding and agree with the plan and Code Status.  Code Status:   FULL CODE  Admission status: Inpatient: Based on patients clinical presentation and evaluation of  above clinical data, I have made determination that patient meets Inpatient criteria at this time. Pt has SBO and will need to be NPO, and iv fluids so does not become dehydrated.   Time spent in minutes : 70   Jani Gravel M.D on 01/07/2019 at 1:10 AM

## 2019-01-07 NOTE — ED Provider Notes (Signed)
Patient found to have a small bowel obstruction.  He refuses NG tube at this time. We will admit to the hospital.  Discussed with Dr. Selena Batten for admission   Zadie Rhine, MD 01/07/19 [phone]

## 2019-01-08 ENCOUNTER — Encounter (HOSPITAL_COMMUNITY): Payer: Self-pay

## 2019-01-08 ENCOUNTER — Inpatient Hospital Stay (HOSPITAL_COMMUNITY)
Admission: EM | Admit: 2019-01-08 | Discharge: 2019-01-10 | Discharge status: Against Medical Advice | Payer: Medicare PPO | Source: Home / Self Care | Attending: Family Medicine | Admitting: Family Medicine

## 2019-01-08 ENCOUNTER — Other Ambulatory Visit: Payer: Self-pay

## 2019-01-08 DIAGNOSIS — F1910 Other psychoactive substance abuse, uncomplicated: Secondary | ICD-10-CM

## 2019-01-08 DIAGNOSIS — R000 Tachycardia, unspecified: Secondary | ICD-10-CM | POA: Diagnosis present

## 2019-01-08 DIAGNOSIS — F1010 Alcohol abuse, uncomplicated: Secondary | ICD-10-CM | POA: Diagnosis present

## 2019-01-08 DIAGNOSIS — E43 Unspecified severe protein-calorie malnutrition: Secondary | ICD-10-CM

## 2019-01-08 DIAGNOSIS — F1020 Alcohol dependence, uncomplicated: Secondary | ICD-10-CM | POA: Diagnosis present

## 2019-01-08 DIAGNOSIS — E871 Hypo-osmolality and hyponatremia: Secondary | ICD-10-CM

## 2019-01-08 DIAGNOSIS — D638 Anemia in other chronic diseases classified elsewhere: Secondary | ICD-10-CM | POA: Diagnosis present

## 2019-01-08 DIAGNOSIS — Z720 Tobacco use: Secondary | ICD-10-CM

## 2019-01-08 DIAGNOSIS — G894 Chronic pain syndrome: Secondary | ICD-10-CM

## 2019-01-08 DIAGNOSIS — B1710 Acute hepatitis C without hepatic coma: Secondary | ICD-10-CM

## 2019-01-08 DIAGNOSIS — R1084 Generalized abdominal pain: Secondary | ICD-10-CM

## 2019-01-08 DIAGNOSIS — K56609 Unspecified intestinal obstruction, unspecified as to partial versus complete obstruction: Principal | ICD-10-CM

## 2019-01-08 DIAGNOSIS — I10 Essential (primary) hypertension: Secondary | ICD-10-CM | POA: Diagnosis present

## 2019-01-08 LAB — COMPREHENSIVE METABOLIC PANEL WITH GFR
ALT: 13 U/L (ref 0–44)
AST: 15 U/L (ref 15–41)
Albumin: 2.3 g/dL — ABNORMAL LOW (ref 3.5–5.0)
Alkaline Phosphatase: 49 U/L (ref 38–126)
Anion gap: 8 (ref 5–15)
BUN: 12 mg/dL (ref 6–20)
CO2: 24 mmol/L (ref 22–32)
Calcium: 8.2 mg/dL — ABNORMAL LOW (ref 8.9–10.3)
Chloride: 100 mmol/L (ref 98–111)
Creatinine, Ser: 0.97 mg/dL (ref 0.61–1.24)
GFR calc Af Amer: >60 mL/min
GFR calc non Af Amer: >60 mL/min
Glucose, Bld: 125 mg/dL — ABNORMAL HIGH (ref 70–99)
Potassium: 4.6 mmol/L (ref 3.5–5.1)
Sodium: 132 mmol/L — ABNORMAL LOW (ref 135–145)
Total Bilirubin: 0.3 mg/dL (ref 0.3–1.2)
Total Protein: 5.6 g/dL — ABNORMAL LOW (ref 6.5–8.1)

## 2019-01-08 LAB — CBC WITH DIFFERENTIAL/PLATELET
Abs Immature Granulocytes: 0.03 K/uL (ref 0.00–0.07)
Basophils Absolute: 0.1 K/uL (ref 0.0–0.1)
Basophils Relative: 1 %
Eosinophils Absolute: 0.1 K/uL (ref 0.0–0.5)
Eosinophils Relative: 1 %
HCT: 32.9 % — ABNORMAL LOW (ref 39.0–52.0)
Hemoglobin: 10.1 g/dL — ABNORMAL LOW (ref 13.0–17.0)
Immature Granulocytes: 1 %
Lymphocytes Relative: 45 %
Lymphs Abs: 2.6 K/uL (ref 0.7–4.0)
MCH: 27.8 pg (ref 26.0–34.0)
MCHC: 30.7 g/dL (ref 30.0–36.0)
MCV: 90.6 fL (ref 80.0–100.0)
Monocytes Absolute: 0.5 K/uL (ref 0.1–1.0)
Monocytes Relative: 9 %
Neutro Abs: 2.4 K/uL (ref 1.7–7.7)
Neutrophils Relative %: 43 %
Platelets: 382 K/uL (ref 150–400)
RBC: 3.63 MIL/uL — ABNORMAL LOW (ref 4.22–5.81)
RDW: 15.5 % (ref 11.5–15.5)
WBC: 5.7 K/uL (ref 4.0–10.5)
nRBC: 0 % (ref 0.0–0.2)

## 2019-01-08 LAB — RAPID URINE DRUG SCREEN, HOSP PERFORMED
Amphetamines: NOT DETECTED
Barbiturates: NOT DETECTED
Benzodiazepines: NOT DETECTED
Cocaine: NOT DETECTED
Opiates: POSITIVE — AB
Tetrahydrocannabinol: NOT DETECTED

## 2019-01-08 LAB — ETHANOL: Alcohol, Ethyl (B): <10 mg/dL

## 2019-01-08 LAB — HIV ANTIBODY (ROUTINE TESTING W REFLEX): HIV Screen 4th Generation wRfx: NONREACTIVE

## 2019-01-08 LAB — AMMONIA: Ammonia: 19 umol/L (ref 9–35)

## 2019-01-08 MED ORDER — MORPHINE SULFATE (PF) 2 MG/ML IV SOLN
2.0000 mg | Freq: Once | INTRAVENOUS | Status: AC
  Administered 2019-01-08: 12:00:00 2 mg via INTRAVENOUS
  Filled 2019-01-08: qty 1

## 2019-01-08 MED ORDER — PANTOPRAZOLE SODIUM 40 MG PO TBEC
40.0000 mg | DELAYED_RELEASE_TABLET | Freq: Every day | ORAL | Status: DC
  Administered 2019-01-08 – 2019-01-09 (×2): 40 mg via ORAL
  Filled 2019-01-08 (×2): qty 1

## 2019-01-08 MED ORDER — ONDANSETRON HCL 4 MG/2ML IJ SOLN: 4.0000 mg | Freq: Four times a day (QID) | INTRAMUSCULAR | Status: DC | PRN

## 2019-01-08 MED ORDER — VITAMIN B-1 100 MG PO TABS
100.0000 mg | ORAL_TABLET | Freq: Every day | ORAL | Status: DC
  Administered 2019-01-08 – 2019-01-09 (×2): 100 mg via ORAL
  Filled 2019-01-08 (×2): qty 1

## 2019-01-08 MED ORDER — MORPHINE SULFATE (PF) 2 MG/ML IV SOLN
1.0000 mg | INTRAVENOUS | Status: DC | PRN
  Administered 2019-01-08 (×2): 1 mg via INTRAVENOUS
  Filled 2019-01-08 (×2): qty 1

## 2019-01-08 MED ORDER — DEXTROSE-NACL 5-0.9 % IV SOLN
INTRAVENOUS | Status: DC
  Administered 2019-01-08: 16:00:00 via INTRAVENOUS

## 2019-01-08 MED ORDER — ADULT MULTIVITAMIN W/MINERALS CH
1.0000 | ORAL_TABLET | Freq: Every day | ORAL | Status: DC
  Administered 2019-01-08 – 2019-01-09 (×2): 1 via ORAL
  Filled 2019-01-08 (×2): qty 1

## 2019-01-08 MED ORDER — FERROUS SULFATE 325 (65 FE) MG PO TABS
325.0000 mg | ORAL_TABLET | Freq: Every day | ORAL | Status: DC
  Administered 2019-01-09: 08:00:00 325 mg via ORAL
  Filled 2019-01-08 (×2): qty 1

## 2019-01-08 MED ORDER — LORAZEPAM 1 MG PO TABS
1.0000 mg | ORAL_TABLET | Freq: Four times a day (QID) | ORAL | Status: DC | PRN
  Administered 2019-01-09: 22:00:00 1 mg via ORAL
  Filled 2019-01-08: qty 1

## 2019-01-08 MED ORDER — NICOTINE 21 MG/24HR TD PT24
21.0000 mg | MEDICATED_PATCH | Freq: Every day | TRANSDERMAL | Status: DC
  Filled 2019-01-08 (×2): qty 1

## 2019-01-08 MED ORDER — FOLIC ACID 1 MG PO TABS
1.0000 mg | ORAL_TABLET | Freq: Every day | ORAL | Status: DC
  Administered 2019-01-08 – 2019-01-09 (×2): 1 mg via ORAL
  Filled 2019-01-08 (×2): qty 1

## 2019-01-08 MED ORDER — CARVEDILOL 3.125 MG PO TABS
3.1250 mg | ORAL_TABLET | Freq: Two times a day (BID) | ORAL | Status: DC
  Administered 2019-01-08 – 2019-01-09 (×3): 3.125 mg via ORAL
  Filled 2019-01-08 (×3): qty 1

## 2019-01-08 MED ORDER — LORAZEPAM 2 MG/ML IJ SOLN
0.0000 mg | Freq: Four times a day (QID) | INTRAMUSCULAR | Status: DC
  Administered 2019-01-09: 08:00:00 2 mg via INTRAVENOUS
  Filled 2019-01-08: qty 1

## 2019-01-08 MED ORDER — LORAZEPAM 2 MG/ML IJ SOLN: 0.0000 mg | Freq: Two times a day (BID) | INTRAMUSCULAR | Status: DC

## 2019-01-08 MED ORDER — MORPHINE SULFATE (PF) 2 MG/ML IV SOLN
1.5000 mg | INTRAVENOUS | Status: DC | PRN
  Administered 2019-01-08 – 2019-01-09 (×8): 1.5 mg via INTRAVENOUS
  Filled 2019-01-08 (×8): qty 1

## 2019-01-08 MED ORDER — ENOXAPARIN SODIUM 40 MG/0.4ML ~~LOC~~ SOLN
40.0000 mg | SUBCUTANEOUS | Status: DC
  Filled 2019-01-08 (×2): qty 0.4

## 2019-01-08 MED ORDER — ONDANSETRON HCL 4 MG PO TABS: 4.0000 mg | ORAL_TABLET | Freq: Four times a day (QID) | ORAL | Status: DC | PRN

## 2019-01-08 MED ORDER — TRAZODONE HCL 50 MG PO TABS: 25.0000 mg | ORAL_TABLET | Freq: Every evening | ORAL | Status: DC | PRN

## 2019-01-08 MED ORDER — SODIUM CHLORIDE 0.9 % IV BOLUS
1000.0000 mL | Freq: Once | INTRAVENOUS | Status: AC
  Administered 2019-01-08: 12:00:00 1000 mL via INTRAVENOUS

## 2019-01-08 MED ORDER — LORAZEPAM 2 MG/ML IJ SOLN: 1.0000 mg | Freq: Four times a day (QID) | INTRAMUSCULAR | Status: DC | PRN

## 2019-01-08 NOTE — Progress Notes (Signed)
Instructed patient that he currently can not have anything to eat or drink by mouth. Patient is non-compliant.

## 2019-01-08 NOTE — Plan of Care (Signed)
Patient states current pain management is not adequate for abdominal pain.

## 2019-01-08 NOTE — ED Provider Notes (Signed)
[facility] EMERGENCY DEPARTMENT Provider Note   CSN: [id number] Arrival date & time: 01/08/19  1053    History   Chief Complaint Chief Complaint  Patient presents with  . Abdominal Pain    HPI [name] is a 53 y.o. male with a PMH of SBO, GERD, Cirrhosis, hepatitis C, Rhabdomyolysis, and alcohol abuse presenting with constant periumbilical sharp abdominal pain onset 1 week ago. Patient reports last BM was 3 days ago. Patient states he left AMA last night because he wanted to drink alcohol. Patient states he drank one beer last night, but decided to come to the ER today because pain remains uncontrolled. Nothing makes symptoms better or worse. Patient denies nausea or vomiting. Patient reports he is passing flatus. Patient reports having an abdominal tumor as a child and having surgery. Patient reports a hernia repair and an appendectomy in the past. Patient states he last ate 2 days ago.     HPI  Past Medical History:  Diagnosis Date  . Acute renal failure (HCC)    Secondary to rhabdo. Treated with dialysis short-term.  . Alcohol abuse    Psychiatric admissions for alcohol and drug abuse  . Back pain, chronic   . Chronic leg pain   . Chronic pain syndrome   . Cirrhosis (HCC)   . Depression    Multiple psychiatric admissions  . Hepatitis C   . History of cocaine abuse (HCC)   . Hyponatremia   . Low TSH level January 2013   Normal free T4 of 1.06  . Myositis    Left deltoid biopsy at [facility] 10/11/11.  . 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 Polysubstance abuse (HCC)   . Rhabdomyolysis   . Small bowel obstruction (HCC)   . Tattoos   . Thrombocytopenia (HCC)   . Tobacco abuse     Patient Active Problem List   Diagnosis Date Noted  . Severe protein-calorie malnutrition (HCC) 01/07/2019  . History of alcohol use   . Benign essential HTN   . Depression   . SBO (small bowel obstruction) (HCC) 11/09/2018  . Chronic low back pain 09/22/2015  . Bulging lumbar disc  09/22/2015  . Thrombocytopenia (HCC) 09/22/2015  . Chronic hepatitis C (HCC) 09/22/2015  . Alcohol dependence (HCC) 06/30/2014  . Polysubstance abuse (HCC) 05/02/2013  . Nausea and vomiting in adult patient 05/02/2013  . Generalized muscle ache 09/06/2012  . 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 Leukocytosis 09/06/2012  . Muscle weakness (generalized) 09/06/2012  . UTI (urinary tract infection) 09/06/2012  . Hyperkalemia 05/28/2012  . Rhabdomyolysis 05/28/2012  . Acute renal failure (HCC) 05/28/2012  . Hyponatremia 05/28/2012  . Elevated liver enzymes 05/28/2012  . Metabolic encephalopathy 12/11/2011  . Overdose 12/11/2011  . Encephalopathy 10/17/2011  . Tachycardia 10/17/2011  . Dehydration 10/17/2011  . Hypernatremia 10/17/2011  . Hepatitis C 10/17/2011  . Elevated LFTs 10/17/2011  . Alcohol abuse 10/17/2011  . Alcohol intoxication (HCC) 10/17/2011  . Muscular disease 10/17/2011  . Chronic pain syndrome 10/17/2011  . BACK PAIN 06/17/2010  . Myalgia and myositis 06/17/2010  . LEG PAIN, RIGHT 06/17/2010  . Tobacco abuse 11/25/2009  . HEPATITIS C 09/18/1996    Past Surgical History:  Procedure Laterality Date  . APPENDECTOMY    . HERNIA REPAIR    . MUSCLE BIOPSY          Home Medications    Prior to Admission medications   Medication Sig Start Date End Date Taking? Authorizing Provider  diphenhydramine-acetaminophen (TYLENOL PM)  25-500 MG TABS tablet Take 1 tablet by mouth at bedtime as needed for pain.   Yes [provider]  Ferrous Sulfate (IRON) 325 (65 Fe) MG TABS Take 1 tablet by mouth 2 (two) times daily. 12/25/18  Yes [provider]  gabapentin (NEURONTIN) 300 MG capsule Take 300 mg by mouth 3 (three) times daily. 12/25/18  Yes [provider]  carvedilol (COREG) 6.25 MG tablet Take 1 tablet (6.25 mg total) by mouth 2 (two) times daily with a meal. Patient not taking: Reported on 01/08/2019 11/12/18   Vassie Loll, MD  folic acid  (FOLVITE) 1 MG tablet Take 1 tablet (1 mg total) by mouth daily. Patient not taking: Reported on 01/08/2019 11/13/18   Vassie Loll, MD  lactulose (CHRONULAC) 10 GM/15ML solution Take 15 mLs (10 g total) by mouth 2 (two) times daily as needed (to achieve 2-3 BM's daily; hold if having desired amount of BM's without use of lactulose.). Patient not taking: Reported on 01/08/2019 11/12/18   Vassie Loll, MD  mirtazapine (REMERON) 30 MG tablet Take 1 tablet (30 mg total) by mouth at bedtime. Patient not taking: Reported on 01/08/2019 11/12/18   Vassie Loll, MD  nicotine (NICODERM CQ - DOSED IN MG/24 HOURS) 21 mg/24hr patch Place 1 patch (21 mg total) onto the skin daily. Patient not taking: Reported on 01/08/2019 11/13/18   Vassie Loll, MD  pantoprazole (PROTONIX) 40 MG tablet Take 1 tablet (40 mg total) by mouth daily. Patient not taking: Reported on 01/08/2019 11/13/18   Vassie Loll, MD  thiamine 100 MG tablet Take 1 tablet (100 mg total) by mouth daily. Patient not taking: Reported on 01/08/2019 11/13/18   Vassie Loll, MD    Family History Family History  Problem Relation Age of Onset  . Hypertension Mother   . Cancer Father        throat cancer   . Diabetes Brother     Social History Social History   Tobacco Use  . Smoking status: Current Every Day Smoker    Packs/day: 1.00    Years: 40.00    Pack years: 40.00    Types: Cigarettes  . Smokeless tobacco: Never Used  Substance Use Topics  . Alcohol use: Yes    Alcohol/week: 100.0 standard drinks    Types: 100 Cans of beer per week    Comment: daily  . Drug use: Yes    Types: Cocaine, Marijuana     Allergies   Acetaminophen   Review of Systems Review of Systems  Constitutional: Negative for activity change, appetite change, chills, fever and unexpected weight change.  HENT: Negative for congestion, rhinorrhea and sore throat.   Eyes: Negative for visual disturbance.  Respiratory: Negative for cough and shortness of  breath.   Cardiovascular: Negative for chest pain.  Gastrointestinal: Positive for abdominal pain and constipation. Negative for diarrhea, nausea and vomiting.  Endocrine: Negative for polydipsia, polyphagia and polyuria.  Genitourinary: Negative for dysuria, flank pain and frequency.  Musculoskeletal: Negative for back pain.  Skin: Negative for rash.  Allergic/Immunologic: Negative for immunocompromised state.  Psychiatric/Behavioral: The patient is not nervous/anxious.      Physical Exam Updated Vital Signs BP (!) 138/99   Pulse 100   Temp 98.3 F (36.8 C)   Resp 12   Ht 5\' 7"  (1.702 m)   Wt 55.6 kg   SpO2 100%   BMI 19.20 kg/m   Physical Exam Vitals signs and nursing note reviewed.  Constitutional:  General: He is in acute distress.     Appearance: He is well-developed. He is not diaphoretic.     Comments: Patient is laying in bed in distress due to abdominal pain.  HENT:     Head: Normocephalic and atraumatic.  Neck:     Musculoskeletal: Normal range of motion and neck supple.  Cardiovascular:     Rate and Rhythm: Normal rate and regular rhythm.     Heart sounds: Normal heart sounds. No murmur. No friction rub. No gallop.   Pulmonary:     Effort: Pulmonary effort is normal. No respiratory distress.     Breath sounds: Normal breath sounds. No wheezing or rales.  Abdominal:     General: Bowel sounds are normal. There is no distension.     Palpations: Abdomen is soft. Abdomen is not rigid. There is no mass.     Tenderness: There is generalized abdominal tenderness. There is guarding. There is no rebound.     Hernia: No hernia is present.  Musculoskeletal: Normal range of motion.  Skin:    Findings: No rash.  Neurological:     Mental Status: He is alert and oriented to person, place, and time.     ED Treatments / Results  Labs (all labs ordered are listed, but only abnormal results are displayed) Labs Reviewed  COMPREHENSIVE METABOLIC PANEL - Abnormal;  Notable for the following components:      Result Value   Sodium 132 (*)    Glucose, Bld 125 (*)    Calcium 8.2 (*)    Total Protein 5.6 (*)    Albumin 2.3 (*)    All other components within normal limits  CBC WITH DIFFERENTIAL/PLATELET - Abnormal; Notable for the following components:   RBC 3.63 (*)    Hemoglobin 10.1 (*)    HCT 32.9 (*)    All other components within normal limits  ETHANOL    EKG None  Radiology Dg Chest 2 View  Result Date: 01/07/2019 CLINICAL DATA:  Preoperative evaluation EXAM: CHEST - 2 VIEW COMPARISON:  November 09, 2018 FINDINGS: There is no edema or consolidation. The heart size and pulmonary vascularity are normal. No adenopathy. There is an old healed fracture of the posterior right ninth rib. IMPRESSION: No edema or consolidation. Electronically Signed   By: Bretta Bang III M.D.   On: 01/07/2019 10:23   Ct Abdomen Pelvis W Contrast  Result Date: 01/06/2019 CLINICAL DATA:  Bowel obstruction.  Abdominal pain. EXAM: CT ABDOMEN AND PELVIS WITH CONTRAST TECHNIQUE: Multidetector CT imaging of the abdomen and pelvis was performed using the standard protocol following bolus administration of intravenous contrast. CONTRAST:  OMNIPAQUE IOHEXOL 300 MG/ML  SOLN COMPARISON:  11/09/2018 FINDINGS: Lower chest: Lung bases are clear. No effusions. Heart is normal size. Hepatobiliary: Small hypodensity in the left hepatic lobe compatible with small cysts. No suspicious focal hepatic abnormality. Gallbladder unremarkable. No biliary ductal dilatation. Pancreas: No focal abnormality or ductal dilatation. Spleen: No focal abnormality.  Normal size. Adrenals/Urinary Tract: No adrenal abnormality. No focal renal abnormality. No stones or hydronephrosis. Urinary bladder is unremarkable. Stomach/Bowel: There are dilated fluid-filled proximal and mid small bowel loops. Transition is noted in the pelvis near the midline where there is a thick walled small bowel loop with  mucosal enhancement paddle with enteritis, infectious or inflammatory. Large bowel is decompressed and grossly unremarkable. Stomach unremarkable. Vascular/Lymphatic: Aortic atherosclerosis. No enlarged abdominal or pelvic lymph nodes. Reproductive: No visible focal abnormality. Other: Small amount of free fluid  in the pelvis and adjacent to the liver. Musculoskeletal: No acute bony abnormality. IMPRESSION: Small bowel obstruction with transition noted in the lower pelvis where there is and segment of small bowel with with thick wall and mucosal enhancement compatible with infectious or inflammatory enteritis. Small amount of free fluid in the abdomen and pelvis. Aortic atherosclerosis. Electronically Signed   By: Charlett NoseKevin  Dover M.D.   On: 01/06/2019 23:50    Procedures Procedures (including critical care time)  Medications Ordered in ED Medications  sodium chloride 0.9 % bolus 1,000 mL (1,000 mLs Intravenous New Bag/Given 01/08/19 1217)  morphine 2 MG/ML injection 2 mg (2 mg Intravenous Given 01/08/19 1218)     Initial Impression / Assessment and Plan / ED Course  I have reviewed the triage vital signs and the nursing notes.  Pertinent labs & imaging results that were available during my care of the patient were reviewed by me and considered in my medical decision making (see chart for details).       Patient presents with abdominal pain and was recently diagnosed with a SBO. Patient has diffuse abdominal pain and guarding on exam. Provided IVF and pain medicine while in the ER. Surgery, Dr. Lovell SheehanJenkins, was consulted yesterday and states patient may require exploratory laparotomy since this is recurrent. Discussed case with hospitalist. Hospitalist has agreed at admit patient.  Findings and plan of care discussed with supervising physician Dr. Rubin PayorPickering.  Final Clinical Impressions(s) / ED Diagnoses   Final diagnoses:  Generalized abdominal pain  Small bowel obstruction [facility](HCC)    ED  Discharge Orders    None       Glade StanfordHernandez, Jamale Spangler P, PA-C 01/08/19 1245    Benjiman CorePickering, Nathan, MD 01/08/19 [phone]

## 2019-01-08 NOTE — H&P (Signed)
History and Physical  [name] ZOX:[id number] DOB: Dec 31, 1965 DOA: 01/08/2019  Referring physician: Ardyth Harps PCP: Oval Linsey, MD   Chief Complaint: abdominal pain   HPI: [name] is a 53 y.o. male chronic alcohol abuser, history of hepatitis C and liver cirrhosis, GERD, polysubstance abuse was admitted to the hospital yesterday with 1 week of progressive abdominal pain and found to have a small bowel obstruction.  Last BM was approximately 3 days ago.  He ended up leaving the hospital AGAINST MEDICAL ADVICE early this morning to drink alcohol.  He says that he drank 1 beer and then returned with persistent uncontrolled abdominal pain.  He is unable to eat.  He denies nausea and vomiting.  He has had prior abdominal surgeries in the past including hernia repair and appendectomy.  He reports that he was less able to eat 2 days ago.  The patient says that he only had 1 beer and denies that he used recreational drugs but he has a history of recreational drug use and will be tested due to his leaving hospital abruptly against medical advice.   Review of Systems: All systems reviewed and apart from history of presenting illness, are negative.  Past Medical History:  Diagnosis Date   Acute renal failure (HCC)    Secondary to rhabdo. Treated with dialysis short-term.   Alcohol abuse    Psychiatric admissions for alcohol and drug abuse   Back pain, chronic    Chronic leg pain    Chronic pain syndrome    Cirrhosis (HCC)    Depression    Multiple psychiatric admissions   Hepatitis C    History of cocaine abuse (HCC)    Hyponatremia    Low TSH level January 2013   Normal free T4 of 1.06   Myositis    Left deltoid biopsy at [facility] 10/11/11.   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 He has a 40.00 pack-year smoking history. He has never used smokeless tobacco. He reports current alcohol use of about 100.0 standard drinks of alcohol per week. He reports current drug use. Drugs: Cocaine and Marijuana.  Allergies  Allergen Reactions   Acetaminophen Other (See Comments)    Liver disease     Family History  Problem Relation Age of Onset   Hypertension Mother    Cancer Father        throat cancer    Diabetes Brother     Prior to Admission medications   Medication Sig Start Date End Date Taking? Authorizing Provider  diphenhydramine-acetaminophen (TYLENOL PM) 25-500 MG TABS tablet Take 1 tablet by mouth at bedtime as needed for pain.   Yes [provider]  Ferrous Sulfate (IRON) 325 (65 Fe) MG TABS Take 1 tablet by mouth 2 (two) times daily. 12/25/18  Yes [provider]  gabapentin (NEURONTIN) 300 MG capsule Take 300 mg by mouth 3 (three) times daily. 12/25/18  Yes [provider]  carvedilol (COREG) 6.25 MG tablet Take 1 tablet (6.25 mg total) by mouth 2 (two) times daily with a meal. Patient not taking: Reported on 01/08/2019 11/12/18  Vassie Loll, MD  folic acid (FOLVITE) 1 MG tablet Take 1 tablet (1 mg total) by mouth daily. Patient not taking: Reported on 01/08/2019 11/13/18   Vassie Loll, MD  lactulose (CHRONULAC) 10 GM/15ML solution Take 15 mLs (10 g total) by mouth 2 (two) times daily as needed (to achieve 2-3 BM's daily; hold if having desired amount of BM's without use of lactulose.). Patient not taking: Reported on 01/08/2019 11/12/18   Vassie Loll, MD  mirtazapine (REMERON) 30 MG tablet Take 1 tablet (30 mg total) by mouth at bedtime. Patient not taking: Reported on 01/08/2019 11/12/18   Vassie Loll, MD  nicotine (NICODERM CQ - DOSED IN MG/24 HOURS) 21 mg/24hr patch Place 1 patch (21 mg total) onto the skin  daily. Patient not taking: Reported on 01/08/2019 11/13/18   Vassie Loll, MD  pantoprazole (PROTONIX) 40 MG tablet Take 1 tablet (40 mg total) by mouth daily. Patient not taking: Reported on 01/08/2019 11/13/18   Vassie Loll, MD  thiamine 100 MG tablet Take 1 tablet (100 mg total) by mouth daily. Patient not taking: Reported on 01/08/2019 11/13/18   Vassie Loll, MD   Physical Exam: Vitals:   01/08/19 1107 01/08/19 1108 01/08/19 1130 01/08/19 1200  BP:  (!) 146/101 (!) 137/101 (!) 138/99  Pulse:    100  Resp:   14 12  Temp:  98.3 F (36.8 C)    SpO2:  100%  100%  Weight: 55.6 kg     Height: 5\' 7"  (1.702 m)        General exam: emaciated, poorly nourished patient, lying comfortably supine on the gurney in no obvious distress.  Head, eyes and ENT: Nontraumatic and normocephalic. Pupils equally reacting to light and accommodation. Oral mucosa dry.  Neck: Supple. No JVD, carotid bruit or thyromegaly.  Lymphatics: No lymphadenopathy.  Respiratory system: Clear to auscultation. No increased work of breathing.  Cardiovascular system: normal S1 and S2 heard, RRR. No JVD, murmurs, gallops, clicks or pedal edema.  Gastrointestinal system: Abdomen is nondistended, with generalized tenderness with guarding. Normal bowel sounds heard. No organomegaly or masses appreciated.  Central nervous system: Alert and oriented. No focal neurological deficits.  Extremities: Symmetric 5 x 5 power. Peripheral pulses symmetrically felt.   Skin: No rashes or acute findings.  Musculoskeletal system: Negative exam.  Psychiatry: Pleasant and cooperative.  Labs on Admission:  Basic Metabolic Panel: Recent Labs  Lab 01/06/19 2056 01/07/19 0711 01/08/19 1122  NA 134* 134* 132*  K 4.7 4.8 4.6  CL 97* 102 100  CO2 28 26 24   GLUCOSE 138* 100* 125*  BUN 10 11 12   CREATININE 0.82 0.80 0.97  CALCIUM 8.6* 7.7* 8.2*   Liver Function Tests: Recent Labs  Lab 01/06/19 2056 01/07/19 0711  01/08/19 1122  AST 15 12* 15  ALT 13 11 13   ALKPHOS 51 45 49  BILITOT 0.3 0.1* 0.3  PROT 5.8* 5.0* 5.6*  ALBUMIN 2.3* 2.0* 2.3*   Recent Labs  Lab 01/06/19 2056  LIPASE 33   No results for input(s): AMMONIA in the last 168 hours. CBC: Recent Labs  Lab 01/06/19 2056 01/07/19 0711 01/08/19 1122  WBC 12.0* 7.6 5.7  NEUTROABS 9.4*  --  2.4  HGB 11.2* 9.8* 10.1*  HCT 34.9* 32.2* 32.9*  MCV 87.5 92.5 90.6  PLT 355 317 382   Cardiac Enzymes: No results for input(s): CKTOTAL, CKMB, CKMBINDEX, TROPONINI in the last 168 hours.  BNP (last 3 results) No results for input(s): PROBNP in the  last 8760 hours. CBG: No results for input(s): GLUCAP in the last 168 hours.  Radiological Exams on Admission: Dg Chest 2 View  Result Date: 01/07/2019 CLINICAL DATA:  Preoperative evaluation EXAM: CHEST - 2 VIEW COMPARISON:  November 09, 2018 FINDINGS: There is no edema or consolidation. The heart size and pulmonary vascularity are normal. No adenopathy. There is an old healed fracture of the posterior right ninth rib. IMPRESSION: No edema or consolidation. Electronically Signed   By: Bretta Bang III M.D.   On: 01/07/2019 10:23   Ct Abdomen Pelvis W Contrast  Result Date: 01/06/2019 CLINICAL DATA:  Bowel obstruction.  Abdominal pain. EXAM: CT ABDOMEN AND PELVIS WITH CONTRAST TECHNIQUE: Multidetector CT imaging of the abdomen and pelvis was performed using the standard protocol following bolus administration of intravenous contrast. CONTRAST:  OMNIPAQUE IOHEXOL 300 MG/ML  SOLN COMPARISON:  11/09/2018 FINDINGS: Lower chest: Lung bases are clear. No effusions. Heart is normal size. Hepatobiliary: Small hypodensity in the left hepatic lobe compatible with small cysts. No suspicious focal hepatic abnormality. Gallbladder unremarkable. No biliary ductal dilatation. Pancreas: No focal abnormality or ductal dilatation. Spleen: No focal abnormality.  Normal size. Adrenals/Urinary Tract: No adrenal  abnormality. No focal renal abnormality. No stones or hydronephrosis. Urinary bladder is unremarkable. Stomach/Bowel: There are dilated fluid-filled proximal and mid small bowel loops. Transition is noted in the pelvis near the midline where there is a thick walled small bowel loop with mucosal enhancement paddle with enteritis, infectious or inflammatory. Large bowel is decompressed and grossly unremarkable. Stomach unremarkable. Vascular/Lymphatic: Aortic atherosclerosis. No enlarged abdominal or pelvic lymph nodes. Reproductive: No visible focal abnormality. Other: Small amount of free fluid in the pelvis and adjacent to the liver. Musculoskeletal: No acute bony abnormality. IMPRESSION: Small bowel obstruction with transition noted in the lower pelvis where there is and segment of small bowel with with thick wall and mucosal enhancement compatible with infectious or inflammatory enteritis. Small amount of free fluid in the abdomen and pelvis. Aortic atherosclerosis. Electronically Signed   By: Charlett Nose M.D.   On: 01/06/2019 23:50   Assessment/Plan Principal Problem:   SBO (small bowel obstruction) (HCC) Active Problems:   HEPATITIS C   Tobacco abuse   Tachycardia   Alcohol abuse   Chronic pain syndrome   Hyponatremia   Polysubstance abuse (HCC)   Alcohol dependence (HCC)   Benign essential HTN   Severe protein-calorie malnutrition (HCC)   Anemia, chronic disease  1. Recurrent small bowel obstruction-patient continues to have significant abdominal pain.  I spoke with Dr. Lovell Sheehan who agreed to see him in consultation.  There is a possibility that the patient will require exploratory laparotomy given his recurrent SBO symptoms.  Obtain urine drug screen to rule out cocaine use as that would preclude him from being able to have anesthesia and surgery would have to be delayed.  NGT is been ordered but patient is refusing NGT, patient n.p.o. except sips/meds.  Continue IV fluid hydration.  Follow  electrolytes. 2. Chronic alcoholism-his EtOH level is negative.  CIWA protocol has been ordered and he has been started on vitamin supplements. 3. Chronic liver failure with cirrhosis -no signs of decompensated disease at this time, check ammonia level.  Check PT/INR. 4. Severe protein calorie malnutrition-we will consult dietitian when patient is able to eat and drink. 5. Polysubstance abuse- urine drug screen has been ordered. 6. GERD -Protonix ordered for GI protection. 7. Chronic active nicotine dependence-nicotine patch has been ordered. 8. Essential hypertension-blood pressures poorly  controlled, restarted on carvedilol 3.125 mg twice daily.  DVT Prophylaxis: lovenox/SCDs Code Status: Full   Family Communication: patient updated bedside  Disposition Plan: inpatient    Time spent: 55 mins  Ryan Norwood Laural BenesJohnson, MD [facility] How to contact the [facility] Attending or Consulting provider 7A - 7P or covering provider during after hours 7P -7A, for this patient?  1. Check the care team in [facility] and look for a) attending/consulting [facility] provider listed and b) the [facility] team listed 2. Log into [email or web address] and use [place]'s universal password to access. If you do not have the password, please contact the hospital operator. 3. Locate the [facility] provider you are looking for under [facility] and page to a number that you can be directly reached. 4. If you still have difficulty reaching the provider, please page the [facility] (Director on Call) for the Hospitalists listed on amion for assistance.

## 2019-01-08 NOTE — ED Notes (Signed)
ED Provider at bedside. 

## 2019-01-08 NOTE — ED Notes (Signed)
ED TO INPATIENT HANDOFF REPORT  ED Nurse Name and Phone #: Jeannene Patellalynnette, rn [phone]  S Name/Age/Gender Len BlalockJames W Standlee 53 y.o. male Room/Bed: APA04/APA04  Code Status   Code Status: Prior  Home/SNF/Other Home Patient oriented to: self, place, time and situation Is this baseline? Yes   Triage Complete: Triage complete  Chief Complaint abad pain  Triage Note Pt reports abdominal pain and was admitted to 300 and left last night ama. Pt is back due to pain   Allergies Allergies  Allergen Reactions  . Acetaminophen Other (See Comments)    Liver disease     Level of Care/Admitting Diagnosis ED Disposition    ED Disposition Condition Comment   Admit  [facility]ospital [facility] [[id number]]  Level of Care: Med-Surg [16]  Covid Evaluation: N/A  Diagnosis: SBO (small bowel obstruction) [facility](HCC) [[id number]]  Admitting Physician: Cleora FleetJOHNSON, CLANFORD L [[id number]]  Attending Physician: Cleora FleetJOHNSON, CLANFORD L [[id number]]  Estimated length of stay: past midnight tomorrow  Certification:: I certify this patient will need inpatient services for at least 2 midnights  PT Class (Do Not Modify): Inpatient [101]  PT Acc Code (Do Not Modify): Private [1]       B Medical/Surgery History Past Medical History:  Diagnosis Date  . Acute renal failure (HCC)    Secondary to rhabdo. Treated with dialysis short-term.  . Alcohol abuse    Psychiatric admissions for alcohol and drug abuse  . Back pain, chronic   . Chronic leg pain   . Chronic pain syndrome   . Cirrhosis (HCC)   . Depression    Multiple psychiatric admissions  . Hepatitis C   . History of cocaine abuse (HCC)   . Hyponatremia   . Low TSH level January 2013   Normal free T4 of 1.06  . Myositis    [facility].  . 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 Polysubstance abuse (HCC)   . Rhabdomyolysis   . Small bowel obstruction (HCC)   . Tattoos   . Thrombocytopenia (HCC)   . Tobacco abuse    Past Surgical History:   Procedure Laterality Date  . APPENDECTOMY    . HERNIA REPAIR    . MUSCLE BIOPSY       A IV Location/Drains/Wounds Patient Lines/Drains/Airways Status   Active Line/Drains/Airways    Name:   Placement date:   Placement time:   Site:   Days:   Peripheral IV 01/08/19 Left Antecubital   01/08/19    1119    Antecubital   less than 1          Intake/Output Last 24 hours No intake or output data in the 24 hours ending 01/08/19 1326  Labs/Imaging Results for orders placed or performed during the hospital encounter of 01/08/19 (from the past 48 hour(s))  Comprehensive metabolic panel     Status: Abnormal   Collection Time: 01/08/19 11:22 AM  Result Value Ref Range   Sodium 132 (L) 135 - 145 mmol/L   Potassium 4.6 3.5 - 5.1 mmol/L   Chloride 100 98 - 111 mmol/L   CO2 24 22 - 32 mmol/L   Glucose, Bld 125 (H) 70 - 99 mg/dL   BUN 12 6 - 20 mg/dL   Creatinine, Ser 5.620.97 0.61 - 1.24 mg/dL   Calcium 8.2 (L) 8.9 - 10.3 mg/dL   Total Protein 5.6 (L) 6.5 - 8.1 g/dL   Albumin 2.3 (L) 3.5 - 5.0 g/dL   AST 15 15 - 41  U/L   ALT 13 0 - 44 U/L   Alkaline Phosphatase 49 38 - 126 U/L   Total Bilirubin 0.3 0.3 - 1.2 mg/dL   GFR calc non Af Amer >60 >60 mL/min   GFR calc Af Amer >60 >60 mL/min   Anion gap 8 5 - 15    Comment: Performed at [facility], [address], [place], [place] [address]  CBC with Differential     Status: Abnormal   Collection Time: 01/08/19 11:22 AM  Result Value Ref Range   WBC 5.7 4.0 - 10.5 K/uL   RBC 3.63 (L) 4.22 - 5.81 MIL/uL   Hemoglobin 10.1 (L) 13.0 - 17.0 g/dL   HCT 60.4 (L) 54.0 - 98.1 %   MCV 90.6 80.0 - 100.0 fL   MCH 27.8 26.0 - 34.0 pg   MCHC 30.7 30.0 - 36.0 g/dL   RDW 19.1 47.8 - 29.5 %   Platelets 382 150 - 400 K/uL   nRBC 0.0 0.0 - 0.2 %   Neutrophils Relative % 43 %   Neutro Abs 2.4 1.7 - 7.7 K/uL   Lymphocytes Relative 45 %   Lymphs Abs 2.6 0.7 - 4.0 K/uL   Monocytes Relative 9 %   Monocytes Absolute 0.5 0.1 - 1.0 K/uL   Eosinophils  Relative 1 %   Eosinophils Absolute 0.1 0.0 - 0.5 K/uL   Basophils Relative 1 %   Basophils Absolute 0.1 0.0 - 0.1 K/uL   Immature Granulocytes 1 %   Abs Immature Granulocytes 0.03 0.00 - 0.07 K/uL    Comment: Performed at [facility], [address], [place], [place] [address]  Ethanol     Status: None   Collection Time: 01/08/19 11:22 AM  Result Value Ref Range   Alcohol, Ethyl (B) <10 <10 mg/dL    Comment: (NOTE) Lowest detectable limit for serum alcohol is 10 mg/dL. For medical purposes only. Performed at [facility], [address] [place], [place] [address]    Dg Chest 2 View  Result Date: 01/07/2019 CLINICAL DATA:  Preoperative evaluation EXAM: CHEST - 2 VIEW COMPARISON:  November 09, 2018 FINDINGS: There is no edema or consolidation. The heart size and pulmonary vascularity are normal. No adenopathy. There is an old healed fracture of the posterior right ninth rib. IMPRESSION: No edema or consolidation. Electronically Signed   By: Bretta Bang III M.D.   On: 01/07/2019 10:23   Ct Abdomen Pelvis W Contrast  Result Date: 01/06/2019 CLINICAL DATA:  Bowel obstruction.  Abdominal pain. EXAM: CT ABDOMEN AND PELVIS WITH CONTRAST TECHNIQUE: Multidetector CT imaging of the abdomen and pelvis was performed using the standard protocol following bolus administration of intravenous contrast. CONTRAST:  OMNIPAQUE IOHEXOL 300 MG/ML  SOLN COMPARISON:  11/09/2018 FINDINGS: Lower chest: Lung bases are clear. No effusions. Heart is normal size. Hepatobiliary: Small hypodensity in the left hepatic lobe compatible with small cysts. No suspicious focal hepatic abnormality. Gallbladder unremarkable. No biliary ductal dilatation. Pancreas: No focal abnormality or ductal dilatation. Spleen: No focal abnormality.  Normal size. Adrenals/Urinary Tract: No adrenal abnormality. No focal renal abnormality. No stones or hydronephrosis. Urinary bladder is unremarkable. Stomach/Bowel: There are dilated  fluid-filled proximal and mid small bowel loops. Transition is noted in the pelvis near the midline where there is a thick walled small bowel loop with mucosal enhancement paddle with enteritis, infectious or inflammatory. Large bowel is decompressed and grossly unremarkable. Stomach unremarkable. Vascular/Lymphatic: Aortic atherosclerosis. No enlarged abdominal or pelvic lymph nodes. Reproductive: No  visible focal abnormality. Other: Small amount of free fluid in the pelvis and adjacent to the liver. Musculoskeletal: No acute bony abnormality. IMPRESSION: Small bowel obstruction with transition noted in the lower pelvis where there is and segment of small bowel with with thick wall and mucosal enhancement compatible with infectious or inflammatory enteritis. Small amount of free fluid in the abdomen and pelvis. Aortic atherosclerosis. Electronically Signed   By: Charlett Nose M.D.   On: 01/06/2019 23:50    Pending Labs Unresulted Labs (From admission, onward)    Start     Ordered   01/08/19 1301  Ammonia  Add-on,   R     01/08/19 1300   01/08/19 1247  Rapid urine drug screen (hospital performed)  ONCE - STAT,   R     01/08/19 1246   Signed and Held  CBC  (enoxaparin (LOVENOX)    CrCl >/= 30 ml/min)  Once,   R    Comments:  Baseline for enoxaparin therapy IF NOT ALREADY DRAWN.  Notify MD if PLT < 100 K.    Signed and Held   Signed and Held  Creatinine, serum  (enoxaparin (LOVENOX)    CrCl >/= 30 ml/min)  Once,   R    Comments:  Baseline for enoxaparin therapy IF NOT ALREADY DRAWN.    Signed and Held   Signed and Held  Creatinine, serum  (enoxaparin (LOVENOX)    CrCl >/= 30 ml/min)  Weekly,   R    Comments:  while on enoxaparin therapy    Signed and Held   Signed and Held  Comprehensive metabolic panel  Daily,   R     Signed and Held   Signed and Held  Magnesium  Daily,   R     Signed and Held   Signed and Held  CBC WITH DIFFERENTIAL  Add-on,   R     Signed and Held           Vitals/Pain Today's Vitals   01/08/19 1220 01/08/19 1230 01/08/19 1243 01/08/19 1300  BP:  (!) 146/87  (!) 143/96  Pulse:      Resp:  12  11  Temp:      SpO2:      Weight:      Height:      PainSc: 9   7      Isolation Precautions No active isolations  Medications Medications  sodium chloride 0.9 % bolus 1,000 mL (1,000 mLs Intravenous New Bag/Given 01/08/19 1217)  morphine 2 MG/ML injection 2 mg (2 mg Intravenous Given 01/08/19 1218)    Mobility walks Low fall risk   Focused Assessments    R Recommendations: See Admitting Provider Note  Report given to:   Additional N

## 2019-01-08 NOTE — Progress Notes (Signed)
Patient adamant about leaving. Patient is alert and oriented x 4.  Patient had contacted friend to come and pick him up at emergency department.  Patient stated if he needed to come back because of abdominal pain, he knew to come back in through the emergency room and do the admission process all over again. Patient iv removed and cigarettes returned to patient. Patient escorted off floor and left in stable condition. Patient signed AMA papers before leaving floor.

## 2019-01-08 NOTE — Discharge Summary (Signed)
Physician Discharge Summary  Erich MontaneJames W Shifflett ZOX:[id number] DOB: [birth year] DOA: 01/06/2019  PCP: Oval Linseyondiego, Richard, MD  Admit date: 01/06/2019 Discharge date: 01/08/2019  Admitted From: Home  Disposition:  Home   Recommendations for Outpatient Follow-up and new medication changes:  1. Patient left against medical advice.   Brief/Interim Summary: 53 year old male who presented with abdominal pain.  He does have significant past medical history of prior small bowel obstructions, GERD, cirrhosis, hepatitis C, alcohol and tobacco abuse.  Patient reported 5 days of worsening abdominal pain, associated with abdominal distention, no diarrhea, no vomiting, no fevers.  On his initial physical examination his blood pressure was 134/95, heart rate 115, temperature 99.3, respiratory rate 20, oxygen saturation 96%, his lungs were clear to auscultation bilaterally, heart S1-S2 present and rhythmic, his abdomen was distended but soft, no guarding, positive bowel sounds.  His sodium was 134, potassium 4.7, chloride 97, bicarb 28, glucose 138, BUN 10, creatinine 0.82, AST 15, ALT 13, white count 12.0, hemoglobin 11.2, hematocrit 34.9, platelets 355.  His abdominal CT showed small bowel obstruction with transition noted in the lower pelvis.  His chest radiograph had hyperinflation, no infiltrates.  His EKG had 91 bpm, normal intervals, left axis, no significant ST segment or T wave changes, poor R wave progression.  Patient was admitted to the hospital working diagnosis acute small bowel obstruction.   Patient was admitted to the medical ward, he was seen by Dr. Lovell SheehanJenkins from general surgery, recommendations to continue conservative medical therapy.  He was allowed to have ice chips.  On April 22 12:52 AM he decided to leave AGAINST MEDICAL ADVICE.   Discharge Diagnoses:  Principal Problem:   SBO (small bowel obstruction) (HCC) Active Problems:   Tobacco abuse   Alcohol abuse   Hyponatremia   Severe  protein-calorie malnutrition (HCC)    Discharge Instructions   Allergies as of 01/08/2019      Reactions   Acetaminophen Other (See Comments)   Liver disease       Medication List    ASK your doctor about these medications   carvedilol 6.25 MG tablet Commonly known as:  COREG Take 1 tablet (6.25 mg total) by mouth 2 (two) times daily with a meal.   diphenhydramine-acetaminophen 25-500 MG Tabs tablet Commonly known as:  TYLENOL PM Take 1 tablet by mouth at bedtime as needed for pain.   folic acid 1 MG tablet Commonly known as:  FOLVITE Take 1 tablet (1 mg total) by mouth daily.   gabapentin 300 MG capsule Commonly known as:  NEURONTIN Take 300 mg by mouth 3 (three) times daily.   Iron 325 (65 Fe) MG Tabs Take 1 tablet by mouth 2 (two) times daily.   lactulose 10 GM/15ML solution Commonly known as:  CHRONULAC Take 15 mLs (10 g total) by mouth 2 (two) times daily as needed (to achieve 2-3 BM's daily; hold if having desired amount of BM's without use of lactulose.).   mirtazapine 30 MG tablet Commonly known as:  REMERON Take 1 tablet (30 mg total) by mouth at bedtime.   nicotine 21 mg/24hr patch Commonly known as:  NICODERM CQ - dosed in mg/24 hours Place 1 patch (21 mg total) onto the skin daily.   pantoprazole 40 MG tablet Commonly known as:  PROTONIX Take 1 tablet (40 mg total) by mouth daily.   thiamine 100 MG tablet Take 1 tablet (100 mg total) by mouth daily.       Allergies  Allergen Reactions  .  Acetaminophen Other (See Comments)    Liver disease     Consultations: Dr. Lovell Sheehan from surgery.   Procedures/Studies: Dg Chest 2 View  Result Date: 01/07/2019 CLINICAL DATA:  Preoperative evaluation EXAM: CHEST - 2 VIEW COMPARISON:  November 09, 2018 FINDINGS: There is no edema or consolidation. The heart size and pulmonary vascularity are normal. No adenopathy. There is an old healed fracture of the posterior right ninth rib. IMPRESSION: No edema or  consolidation. Electronically Signed   By: Bretta Bang III M.D.   On: 01/07/2019 10:23   Ct Abdomen Pelvis W Contrast  Result Date: 01/06/2019 CLINICAL DATA:  Bowel obstruction.  Abdominal pain. EXAM: CT ABDOMEN AND PELVIS WITH CONTRAST TECHNIQUE: Multidetector CT imaging of the abdomen and pelvis was performed using the standard protocol following bolus administration of intravenous contrast. CONTRAST:  OMNIPAQUE IOHEXOL 300 MG/ML  SOLN COMPARISON:  11/09/2018 FINDINGS: Lower chest: Lung bases are clear. No effusions. Heart is normal size. Hepatobiliary: Small hypodensity in the left hepatic lobe compatible with small cysts. No suspicious focal hepatic abnormality. Gallbladder unremarkable. No biliary ductal dilatation. Pancreas: No focal abnormality or ductal dilatation. Spleen: No focal abnormality.  Normal size. Adrenals/Urinary Tract: No adrenal abnormality. No focal renal abnormality. No stones or hydronephrosis. Urinary bladder is unremarkable. Stomach/Bowel: There are dilated fluid-filled proximal and mid small bowel loops. Transition is noted in the pelvis near the midline where there is a thick walled small bowel loop with mucosal enhancement paddle with enteritis, infectious or inflammatory. Large bowel is decompressed and grossly unremarkable. Stomach unremarkable. Vascular/Lymphatic: Aortic atherosclerosis. No enlarged abdominal or pelvic lymph nodes. Reproductive: No visible focal abnormality. Other: Small amount of free fluid in the pelvis and adjacent to the liver. Musculoskeletal: No acute bony abnormality. IMPRESSION: Small bowel obstruction with transition noted in the lower pelvis where there is and segment of small bowel with with thick wall and mucosal enhancement compatible with infectious or inflammatory enteritis. Small amount of free fluid in the abdomen and pelvis. Aortic atherosclerosis. Electronically Signed   By: Charlett Nose M.D.   On: 01/06/2019 23:50        Discharge Exam: Vitals:   01/07/19 2355 01/07/19 2355  BP: (!) 127/93 (!) 127/93  Pulse: 89 97  Resp:    Temp: 98.2 F (36.8 C) 98.2 F (36.8 C)  SpO2: 99% 99%   Vitals:   01/07/19 1446 01/07/19 2127 01/07/19 2355 01/07/19 2355  BP: (!) 145/95  (!) 127/93 (!) 127/93  Pulse: 89  89 97  Resp: 19     Temp: 98.6 F (37 C)  98.2 F (36.8 C) 98.2 F (36.8 C)  TempSrc: Oral  Oral Oral  SpO2: 100% 97% 99% 99%  Weight:      Height:         The results of significant diagnostics from this hospitalization (including imaging, microbiology, ancillary and laboratory) are listed below for reference.     Microbiology: No results found for this or any previous visit (from the past 240 hour(s)).   Labs: BNP (last 3 results) No results for input(s): BNP in the last 8760 hours. Basic Metabolic Panel: Recent Labs  Lab 01/06/19 2056 01/07/19 0711 01/08/19 1122  NA 134* 134* 132*  K 4.7 4.8 4.6  CL 97* 102 100  CO2 28 26 24   GLUCOSE 138* 100* 125*  BUN 10 11 12   CREATININE 0.82 0.80 0.97  CALCIUM 8.6* 7.7* 8.2*   Liver Function Tests: Recent Labs  Lab 01/06/19 2056 01/07/19  5643 01/08/19 1122  AST 15 12* 15  ALT ALKPHOS 51 45 49  BILITOT 0.3 0.1* 0.3  PROT 5.8* 5.0* 5.6*  ALBUMIN 2.3* 2.0* 2.3*   Recent Labs  Lab 01/06/19 2056  LIPASE 33   Recent Labs  Lab 01/08/19 1324  AMMONIA 19   CBC: Recent Labs  Lab 01/06/19 2056 01/07/19 0711 01/08/19 1122  WBC 12.0* 7.6 5.7  NEUTROABS 9.4*  --  2.4  HGB 11.2* 9.8* 10.1*  HCT 34.9* 32.2* 32.9*  MCV 87.5 92.5 90.6  PLT 355 317 382   Cardiac Enzymes: No results for input(s): CKTOTAL, CKMB, CKMBINDEX, TROPONINI in the last 168 hours. BNP: Invalid input(s): POCBNP CBG: No results for input(s): GLUCAP in the last 168 hours. D-Dimer No results for input(s): DDIMER in the last 72 hours. Hgb A1c No results for input(s): HGBA1C in the last 72 hours. Lipid Profile No results for input(s):  CHOL, HDL, LDLCALC, TRIG, CHOLHDL, LDLDIRECT in the last 72 hours. Thyroid function studies No results for input(s): TSH, T4TOTAL, T3FREE, THYROIDAB in the last 72 hours.  Invalid input(s): FREET3 Anemia work up No results for input(s): VITAMINB12, FOLATE, FERRITIN, TIBC, IRON, RETICCTPCT in the last 72 hours. Urinalysis    Component Value Date/Time   COLORURINE YELLOW 11/09/2018 0752   APPEARANCEUR CLEAR 11/09/2018 0752   LABSPEC 1.011 11/09/2018 0752   PHURINE 6.0 11/09/2018 0752   GLUCOSEU NEGATIVE 11/09/2018 0752   HGBUR NEGATIVE 11/09/2018 0752   BILIRUBINUR NEGATIVE 11/09/2018 0752   KETONESUR NEGATIVE 11/09/2018 0752   PROTEINUR NEGATIVE 11/09/2018 0752   UROBILINOGEN 0.2 07/24/2014 1725   NITRITE NEGATIVE 11/09/2018 0752   LEUKOCYTESUR NEGATIVE 11/09/2018 0752   Sepsis Labs Invalid input(s): PROCALCITONIN,  WBC,  LACTICIDVEN Microbiology No results found for this or any previous visit (from the past 240 hour(s)).   Time coordinating discharge: 45 minutes  SIGNED:   Coralie Keens, MD  [facility] 01/08/2019, 3:27 PM

## 2019-01-08 NOTE — ED Notes (Signed)
Pt states we cannot put the NG tube down unless we "give him enough medicine to knock him out", provider notified

## 2019-01-08 NOTE — ED Triage Notes (Signed)
Pt reports abdominal pain and was admitted to 300 and left last night ama. Pt is back due to pain

## 2019-01-09 ENCOUNTER — Encounter (HOSPITAL_COMMUNITY): Payer: Self-pay | Admitting: Anesthesiology

## 2019-01-09 DIAGNOSIS — R000 Tachycardia, unspecified: Secondary | ICD-10-CM

## 2019-01-09 LAB — COMPREHENSIVE METABOLIC PANEL WITH GFR
ALT: 11 U/L (ref 0–44)
AST: 11 U/L — ABNORMAL LOW (ref 15–41)
Albumin: 2.1 g/dL — ABNORMAL LOW (ref 3.5–5.0)
Alkaline Phosphatase: 50 U/L (ref 38–126)
Anion gap: 6 (ref 5–15)
BUN: 8 mg/dL (ref 6–20)
CO2: 24 mmol/L (ref 22–32)
Calcium: 8.1 mg/dL — ABNORMAL LOW (ref 8.9–10.3)
Chloride: 104 mmol/L (ref 98–111)
Creatinine, Ser: 0.86 mg/dL (ref 0.61–1.24)
GFR calc Af Amer: >60 mL/min (ref >60)
GFR calc non Af Amer: >60 mL/min (ref >60)
Glucose, Bld: 91 mg/dL (ref 70–99)
Potassium: 4.3 mmol/L (ref 3.5–5.1)
Sodium: 134 mmol/L — ABNORMAL LOW (ref 135–145)
Total Bilirubin: 0.3 mg/dL (ref 0.3–1.2)
Total Protein: 5 g/dL — ABNORMAL LOW (ref 6.5–8.1)

## 2019-01-09 LAB — SURGICAL PCR SCREEN
MRSA, PCR: NEGATIVE
Staphylococcus aureus: NEGATIVE

## 2019-01-09 LAB — PROTIME-INR
INR: 1 (ref 0.8–1.2)
Prothrombin Time: 13.3 seconds (ref 11.4–15.2)

## 2019-01-09 LAB — MAGNESIUM: Magnesium: 1.9 mg/dL (ref 1.7–2.4)

## 2019-01-09 MED ORDER — ALVIMOPAN 12 MG PO CAPS: 12.0000 mg | ORAL_CAPSULE | ORAL | Status: DC

## 2019-01-09 MED ORDER — CIPROFLOXACIN IN D5W 400 MG/200ML IV SOLN: 400.0000 mg | INTRAVENOUS | Status: DC

## 2019-01-09 MED ORDER — CHLORHEXIDINE GLUCONATE CLOTH 2 % EX PADS: 6.0000 | MEDICATED_PAD | Freq: Once | CUTANEOUS | Status: DC

## 2019-01-09 NOTE — Progress Notes (Signed)
Initial Nutrition Assessment  RD working remotely.   DOCUMENTATION CODES:      INTERVENTION:  Provide 5-6 small meals daily due to cirrhosis  When patient is cleared for diet advancment add snack between meals TID   B-vitamin supplements: B1, B6, B 12 folate and zinc. Replace vitamins and minerals as needed.  NUTRITION DIAGNOSIS:   Predicted suboptimal nutrient intake related to altered GI function, chronic illness, social / environmental circumstances(polysubstance abuse, history of depression and liver disease.) as evidenced by estimated needs(weight loss over time and nearing underweight status).   GOAL:   Patient will meet greater than or equal to 90% of their needs   MONITOR:   Ddiet advancement following surgery tomorrow, Po intake, labs and wt trends     REASON FOR ASSESSMENT:   Malnutrition Screening Tool    ASSESSMENT:  Patient is a 53 yo male with history of cirrhosis, hepatitis C, alcohol, cocaine and tobaco abuse.  He presents with partial small bowel obstruction. Exploratory laparotomy scheduled for tomorrow.  Full liquid diet -currently. Attempted to reach patient by telephone. Talked with his nurse today Annabelle Harman. Patient consumed 100% of breakfast this morning and has been up walking in the hall.  Review of his weight history shows usual weight 64-68 kg which he maintained from April of 2016 through March of last year. Since February 2020- he has averaged between 54-56 kg. Unwanted-Weight loss of 21% the past year is significant. His BMI is borderline underweight. Expect he meets criteria for severe malnutrition (chronic). 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  Medications reviewed and include: ferrous sulfate, folic acid, multivitamin, nicoderm, thiamine, protonix   IVF- D5-NS @ 50 ml/hr Labs:  BMP Latest Ref Rng & Units 01/09/2019 01/08/2019 01/07/2019  Glucose 70 - 99 mg/dL 91 681(L) 572(I)  BUN 6 - 20 mg/dL 8 12 11    Creatinine 0.61 - 1.24 mg/dL 2.03 5.59 7.41  Sodium 135 - 145 mmol/L 134(L) 132(L) 134(L)  Potassium 3.5 - 5.1 mmol/L 4.3 4.6 4.8  Chloride 98 - 111 mmol/L 104 100 102  CO2 22 - 32 mmol/L 24 24 26   Calcium 8.9 - 10.3 mg/dL 8.1(L) 8.2(L) 7.7(L)     NUTRITION - FOCUSED PHYSICAL EXAM:  Unable to complete Nutrition-Focused physical exam at this time.    Diet Order:   Diet Order            Diet full liquid Room service appropriate? Yes; Fluid consistency: Thin  Diet effective now              EDUCATION NEEDS:  Not appropriate for education at this time   Skin:  Skin Assessment: Reviewed RN Assessment  Last BM:  none- cramping per RN  Height:   Ht Readings from Last 1 Encounters:  01/08/19 5\' 7"  (1.702 m)    Weight:   Wt Readings from Last 1 Encounters:  01/09/19 54 kg    Ideal Body Weight:  67 kg  BMI:  Body mass index is 18.65 kg/m.  Estimated Nutritional Needs:   Kcal:  6384-5364 (33-37 kcal/kg/bw)  Protein:  65-76 (1.2-1.4 gr/kg/bw)  Fluid:  >1500 ml daily   Royann Shivers MS,RD,CSG,LDN Office: [phone] Pager: [phone]

## 2019-01-09 NOTE — Progress Notes (Signed)
Pt noted in family waiting room with empty cup of what appears to have been coffee. Educated him on the importance of being NPO pt states "this is the only thing helping the hunger." Will continue to monitor.

## 2019-01-09 NOTE — Progress Notes (Signed)
PROGRESS NOTE    Ryan MontaneJames W Good  ZOX:[id number]  DOB: Jun 07, 1966  DOA: 01/08/2019 PCP: Oval Linseyondiego, Richard, MD   Brief Admission Hx: 53 year old male with history of hepatitis C, chronic alcoholism, liver cirrhosis, GERD and polysubstance abuse was readmitted yesterday after leaving AMA for treatment of a small bowel obstruction.  He continues to have abdominal pain and has been seen by general surgery and they are planning exploratory laparotomy 01/10/2019.  MDM/Assessment & Plan:   1. Recurrent small bowel obstruction- patient was seen by general surgery and plans are to advance to clear liquid diet today and planning for n.p.o. after midnight and exploratory laparotomy 01/10/2019. 2. Chronic alcoholism- the patient's EtOH level was negative on admission.  He is on the CIWA protocol.  He is on vitamin supplements. 3. Chronic liver failure with liver cirrhosis- he seems to be compensated at this time we are following him closely. 4. Severe protein calorie malnutrition-appreciate assistance of dietitian. 5. Polysubstance abuse-his urine drug screen was positive for opioids only. 6. Chronic active nicotine dependence-nicotine patch ordered and patient has been counseled to stop using all tobacco products. 7. Essential hypertension- he has been resumed on his home carvedilol.  DVT prophylaxis: Lovenox/SCDs Code Status: Full Family Communication: Patient updated at bedside Disposition Plan: Patient   Consultants:  General surgery  Procedures:  Exploratory lap planned for 01/10/2019  Antimicrobials:  N/A  Subjective: The patient says he has had no bowel movement and reports that he has ongoing abdominal pain and discomfort.  He denies emesis.  He has tolerated clear liquids started this morning.  Objective: Vitals:   01/08/19 1937 01/09/19 0003 01/09/19 0552 01/09/19 0616  BP:  (!) 141/102  (!) 141/101  Pulse:  96  (!) 105  Resp:      Temp:  97.8 F (36.6 C)  98.3 F (36.8 C)   TempSrc:  Oral  Oral  SpO2: 100% 100%  100%  Weight:   54 kg   Height:        Intake/Output Summary (Last 24 hours) at 01/09/2019 1313 Last data filed at 01/09/2019 0900 Gross per 24 hour  Intake 1009.99 ml  Output -  Net 1009.99 ml   Filed Weights   01/08/19 1107 01/08/19 1419 01/09/19 0552  Weight: 55.6 kg 55.2 kg 54 kg     REVIEW OF SYSTEMS  As per history otherwise all reviewed and reported negative  Exam:  General exam: Awake, alert, emaciated male poorly groomed in no apparent distress. Respiratory system: Clear. No increased work of breathing. Cardiovascular system: S1 & S2 heard. No JVD, murmurs, gallops, clicks or pedal edema. Gastrointestinal system: Abdomen is nondistended, soft and generalized nonspecific tenderness.  Normal bowel sounds heard. Central nervous system: Alert and oriented. No focal neurological deficits. Extremities: no CCE.  Data Reviewed: Basic Metabolic Panel: Recent Labs  Lab 01/06/19 2056 01/07/19 0711 01/08/19 1122 01/09/19 0509  NA 134* 134* 132* 134*  K 4.7 4.8 4.6 4.3  CL 97* 102 100 104  CO2 28 26 24 24   GLUCOSE 138* 100* 125* 91  BUN 10 11 12 8   CREATININE 0.82 0.80 0.97 0.86  CALCIUM 8.6* 7.7* 8.2* 8.1*  MG  --   --   --  1.9   Liver Function Tests: Recent Labs  Lab 01/06/19 2056 01/07/19 0711 01/08/19 1122 01/09/19 0509  AST 15 12* 15 11*  ALT 13 11 13 11   ALKPHOS 51 45 49 50  BILITOT 0.3 0.1* 0.3 0.3  PROT 5.8* 5.0*  5.6* 5.0*  ALBUMIN 2.3* 2.0* 2.3* 2.1*   Recent Labs  Lab 01/06/19 2056  LIPASE 33   Recent Labs  Lab 01/08/19 1324  AMMONIA 19   CBC: Recent Labs  Lab 01/06/19 2056 01/07/19 0711 01/08/19 1122  WBC 12.0* 7.6 5.7  NEUTROABS 9.4*  --  2.4  HGB 11.2* 9.8* 10.1*  HCT 34.9* 32.2* 32.9*  MCV 87.5 92.5 90.6  PLT 355 317 382   Cardiac Enzymes: No results for input(s): CKTOTAL, CKMB, CKMBINDEX, TROPONINI in the last 168 hours. CBG (last 3)  No results for input(s): GLUCAP in the last  72 hours. No results found for this or any previous visit (from the past 240 hour(s)).   Studies: No results found.   Scheduled Meds: . carvedilol  3.125 mg Oral BID WC  . enoxaparin (LOVENOX) injection  40 mg Subcutaneous Q24H  . ferrous sulfate  325 mg Oral Q breakfast  . folic acid  1 mg Oral Daily  . LORazepam  0-4 mg Intravenous Q6H   Followed by  . [START ON 01/10/2019] LORazepam  0-4 mg Intravenous Q12H  . multivitamin with minerals  1 tablet Oral Daily  . nicotine  21 mg Transdermal Daily  . pantoprazole  40 mg Oral Daily  . thiamine  100 mg Oral Daily   Continuous Infusions: . dextrose 5 % and 0.9% NaCl 50 mL/hr at 01/08/19 1538    Principal Problem:   SBO (small bowel obstruction) (HCC) Active Problems:   HEPATITIS C   Tobacco abuse   Tachycardia   Alcohol abuse   Chronic pain syndrome   Hyponatremia   Polysubstance abuse (HCC)   Alcohol dependence (HCC)   Benign essential HTN   Severe protein-calorie malnutrition (HCC)   Anemia, chronic disease  Time spent:   Standley Dakins, MD [facility] 01/09/2019, 1:13 PM    LOS: 1 day  How to contact the [facility] Attending or Consulting provider 7A - 7P or covering provider during after hours 7P -7A, for this patient?  1. Check the care team in [facility] and look for a) attending/consulting TRH provider listed and b) the [facility] team listed 2. Log into [email or web address] and use [place]'s universal password to access. If you do not have the password, please contact the hospital operator. 3. Locate the [facility] provider you are looking for under [facility] and page to a number that you can be directly reached. 4. If you still have difficulty reaching the provider, please page the [facility] (Director on Call) for the Hospitalists listed on amion for assistance.

## 2019-01-09 NOTE — Care Management (Signed)
TOC team follow for high readmission risk score. Pt seen 01/07/19 with no needs. Has since left AMA and returned to ED. Surgery planned for 01/10/19. TOC team will cont to follow.

## 2019-01-09 NOTE — Plan of Care (Signed)

## 2019-01-09 NOTE — Progress Notes (Signed)
  Subjective: Patient's readmission noted.  He states he still has not had a bowel movement in the last few days.  Describes intermittent crampy periumbilical pain.  No emesis noted.  Objective: Vital signs in last 24 hours: Temp:  [97.7 F (36.5 C)-98.3 F (36.8 C)] 98.3 F (36.8 C) (04/23 0616) Pulse Rate:  [93-105] 105 (04/23 0616) Resp:  [11-18] 18 (04/22 1418) BP: (137-152)/(87-106) 141/101 (04/23 0616) SpO2:  [99 %-100 %] 100 % (04/23 0616) Weight:  [54 kg-55.6 kg] 54 kg (04/23 0552) Last BM Date: (PTA)  Intake/Output from previous day: 04/22 0701 - 04/23 0700 In: 530 [P.O.:5; I.V.:525] Out: -  Intake/Output this shift: No intake/output data recorded.  General appearance: alert, cooperative and no distress Resp: clear to auscultation bilaterally Cardio: regular rate and rhythm, S1, S2 normal, no murmur, click, rub or gallop GI: Soft, nonspecific tenderness to palpation which is migratory in nature.  No rigidity is noted.  Occasional bowel sounds appreciated.  Lab Results:  Recent Labs    01/07/19 0711 01/08/19 1122  WBC 7.6 5.7  HGB 9.8* 10.1*  HCT 32.2* 32.9*  PLT 317 382   BMET Recent Labs    01/08/19 1122 01/09/19 0509  NA 132* 134*  K 4.6 4.3  CL 100 104  CO2 24 24  GLUCOSE 125* 91  BUN 12 8  CREATININE 0.97 0.86  CALCIUM 8.2* 8.1*   PT/INR No results for input(s): LABPROT, INR in the last 72 hours.  Studies/Results: Dg Chest 2 View  Result Date: 01/07/2019 CLINICAL DATA:  Preoperative evaluation EXAM: CHEST - 2 VIEW COMPARISON:  November 09, 2018 FINDINGS: There is no edema or consolidation. The heart size and pulmonary vascularity are normal. No adenopathy. There is an old healed fracture of the posterior right ninth rib. IMPRESSION: No edema or consolidation. Electronically Signed   By: Bretta Bang III M.D.   On: 01/07/2019 10:23    Anti-infectives: Anti-infectives (From admission, onward)   None       Assessment/Plan: Impression: Partial small bowel obstruction.  This has been an ongoing issue for the past few months requiring multiple hospitalizations.  Per COVID-19 protocol, the patient's condition continues to be an issue and I doubt he will tolerate any further delay in surgery.  We will proceed with exploratory laparotomy tomorrow.  The risks and benefits of the procedure including bleeding, infection, bowel resection, and the possibility of recurrence of the obstruction were fully explained to the patient, who gave informed consent.  LOS: 1 day    Franky Macho 01/09/2019

## 2019-01-10 ENCOUNTER — Encounter (HOSPITAL_COMMUNITY)
Admission: EM | Discharge status: Against Medical Advice | Payer: Self-pay | Source: Home / Self Care | Attending: Family Medicine

## 2019-01-10 SURGERY — LAPAROTOMY, EXPLORATORY
Anesthesia: General

## 2019-01-10 NOTE — Progress Notes (Signed)
Dr. Lovell Sheehan returned call and informed nurse that patient would not have surgery today.  RN informed Dr. Lovell Sheehan that patient states if he isn't having surgery, he will leave AMA.  Dr. Lovell Sheehan voiced understanding and is aware of patient's plans.  P.J. Henderson Newcomer, RN

## 2019-01-10 NOTE — Discharge Summary (Signed)
Physician Discharge Summary  ORA CRARY XTA:[id number] DOB: March 15, 1966 DOA: 01/08/2019  PCP: Oval Linsey, MD  Admit date: 01/08/2019 Discharge date: 01/10/2019  PATIENT DISCHARGED AGAINST MEDICAL ADVICE   In the early morning hours prior to planned exploratory laparotomy patient was found by nursing to be drinking coffee and smoking cigarettes and surgery subsequently had to be delayed and patient decided that he was leaving hospital against medical advise if he didn't have surgery today.  The risks of leaving including worsening symptoms and death were explained to patient and he verbalized understanding.  Dr. Lovell Sheehan aware patient left AMA.  The patient signed AMA papers.  This patient is HIGH RISK for readmission.    Procedures/Studies: Dg Chest 2 View  Result Date: 01/07/2019 CLINICAL DATA:  Preoperative evaluation EXAM: CHEST - 2 VIEW COMPARISON:  November 09, 2018 FINDINGS: There is no edema or consolidation. The heart size and pulmonary vascularity are normal. No adenopathy. There is an old healed fracture of the posterior right ninth rib. IMPRESSION: No edema or consolidation. Electronically Signed   By: Bretta Bang III M.D.   On: 01/07/2019 10:23   Ct Abdomen Pelvis W Contrast  Result Date: 01/06/2019 CLINICAL DATA:  Bowel obstruction.  Abdominal pain. EXAM: CT ABDOMEN AND PELVIS WITH CONTRAST TECHNIQUE: Multidetector CT imaging of the abdomen and pelvis was performed using the standard protocol following bolus administration of intravenous contrast. CONTRAST:  OMNIPAQUE IOHEXOL 300 MG/ML  SOLN COMPARISON:  11/09/2018 FINDINGS: Lower chest: Lung bases are clear. No effusions. Heart is normal size. Hepatobiliary: Small hypodensity in the left hepatic lobe compatible with small cysts. No suspicious focal hepatic abnormality. Gallbladder unremarkable. No biliary ductal dilatation. Pancreas: No focal abnormality or ductal dilatation. Spleen: No focal abnormality.  Normal size.  Adrenals/Urinary Tract: No adrenal abnormality. No focal renal abnormality. No stones or hydronephrosis. Urinary bladder is unremarkable. Stomach/Bowel: There are dilated fluid-filled proximal and mid small bowel loops. Transition is noted in the pelvis near the midline where there is a thick walled small bowel loop with mucosal enhancement paddle with enteritis, infectious or inflammatory. Large bowel is decompressed and grossly unremarkable. Stomach unremarkable. Vascular/Lymphatic: Aortic atherosclerosis. No enlarged abdominal or pelvic lymph nodes. Reproductive: No visible focal abnormality. Other: Small amount of free fluid in the pelvis and adjacent to the liver. Musculoskeletal: No acute bony abnormality. IMPRESSION: Small bowel obstruction with transition noted in the lower pelvis where there is and segment of small bowel with with thick wall and mucosal enhancement compatible with infectious or inflammatory enteritis. Small amount of free fluid in the abdomen and pelvis. Aortic atherosclerosis. Electronically Signed   By: Charlett Nose M.D.   On: 01/06/2019 23:50      Discharge Vitals: Vitals:   01/10/19 0008 01/10/19 0009  BP:  102/72  Pulse: 89 85  Resp:    Temp:  (!) 97.5 F (36.4 C)  SpO2: 95% 96%   Vitals:   01/09/19 1335 01/09/19 2159 01/10/19 0008 01/10/19 0009  BP: (!) 139/98 125/89  102/72  Pulse: (!) 106 (!) 101 89 85  Resp: (!) 21 18    Temp: 97.9 F (36.6 C) 98 F (36.7 C)  (!) 97.5 F (36.4 C)  TempSrc: Oral Oral  Oral  SpO2: 98% 100% 95% 96%  Weight:      Height:       Microbiology: Recent Results (from the past 240 hour(s))  Surgical pcr screen     Status: None   Collection Time: 01/09/19  1:55 PM  Result Value Ref Range Status   MRSA, PCR NEGATIVE NEGATIVE Final   Staphylococcus aureus NEGATIVE NEGATIVE Final    Comment: (NOTE) The Xpert SA Assay (FDA approved for NASAL specimens in patients 29 years of age and older), is one component of a  comprehensive surveillance program. It is not intended to diagnose infection nor to guide or monitor treatment. Performed at [facility], [address], [place], [place] [address]      Labs: BNP (last 3 results) No results for input(s): BNP in the last 8760 hours. Basic Metabolic Panel: Recent Labs  Lab 01/06/19 2056 01/07/19 0711 01/08/19 1122 01/09/19 0509  NA 134* 134* 132* 134*  K 4.7 4.8 4.6 4.3  CL 97* 102 100 104  CO2 GLUCOSE 138* 100* 125* 91  BUN CREATININE 0.82 0.80 0.97 0.86  CALCIUM 8.6* 7.7* 8.2* 8.1*  MG  --   --   --  1.9   Liver Function Tests: Recent Labs  Lab 01/06/19 2056 01/07/19 0711 01/08/19 1122 01/09/19 0509  AST 15 12* 15 11*  ALT ALKPHOS 51 45 49 50  BILITOT 0.3 0.1* 0.3 0.3  PROT 5.8* 5.0* 5.6* 5.0*  ALBUMIN 2.3* 2.0* 2.3* 2.1*   Recent Labs  Lab 01/06/19 2056  LIPASE 33   Recent Labs  Lab 01/08/19 1324  AMMONIA 19   CBC: Recent Labs  Lab 01/06/19 2056 01/07/19 0711 01/08/19 1122  WBC 12.0* 7.6 5.7  NEUTROABS 9.4*  --  2.4  HGB 11.2* 9.8* 10.1*  HCT 34.9* 32.2* 32.9*  MCV 87.5 92.5 90.6  PLT 355 317 382   Cardiac Enzymes: No results for input(s): CKTOTAL, CKMB, CKMBINDEX, TROPONINI in the last 168 hours. BNP: Invalid input(s): POCBNP CBG: No results for input(s): GLUCAP in the last 168 hours. D-Dimer No results for input(s): DDIMER in the last 72 hours. Hgb A1c No results for input(s): HGBA1C in the last 72 hours. Lipid Profile No results for input(s): CHOL, HDL, LDLCALC, TRIG, CHOLHDL, LDLDIRECT in the last 72 hours. Thyroid function studies No results for input(s): TSH, T4TOTAL, T3FREE, THYROIDAB in the last 72 hours.  Invalid input(s): FREET3 Anemia work up No results for input(s): VITAMINB12, FOLATE, FERRITIN, TIBC, IRON, RETICCTPCT in the last 72 hours. Urinalysis    Component Value Date/Time   COLORURINE YELLOW 11/09/2018 0752   APPEARANCEUR CLEAR 11/09/2018  0752   LABSPEC 1.011 11/09/2018 0752   PHURINE 6.0 11/09/2018 0752   GLUCOSEU NEGATIVE 11/09/2018 0752   HGBUR NEGATIVE 11/09/2018 0752   BILIRUBINUR NEGATIVE 11/09/2018 0752   KETONESUR NEGATIVE 11/09/2018 0752   PROTEINUR NEGATIVE 11/09/2018 0752   UROBILINOGEN 0.2 07/24/2014 1725   NITRITE NEGATIVE 11/09/2018 0752   LEUKOCYTESUR NEGATIVE 11/09/2018 0752   Sepsis Labs Invalid input(s): PROCALCITONIN,  WBC,  LACTICIDVEN Microbiology Recent Results (from the past 240 hour(s))  Surgical pcr screen     Status: None   Collection Time: 01/09/19  1:55 PM  Result Value Ref Range Status   MRSA, PCR NEGATIVE NEGATIVE Final   Staphylococcus aureus NEGATIVE NEGATIVE Final    Comment: (NOTE) The Xpert SA Assay (FDA approved for NASAL specimens in patients 59 years of age and older), is one component of a comprehensive surveillance program. It is not intended to diagnose infection nor to guide or monitor treatment. Performed at [facility], [address], [place], [place] [address]    Time coordinating discharge:   SIGNED:  Standley Dakinslanford Zephaniah Lubrano, MD  [facility] 01/10/2019, 6:53 AM How to contact the [facility] Attending or Consulting provider 7A - 7P or covering provider during after hours 7P -7A, for this patient?  1. Check the care team in [facility] and look for a) attending/consulting [facility] provider listed and b) the [facility] team listed 2. Log into [email or web address] and use [place]'s universal password to access. If you do not have the password, please contact the hospital operator. 3. Locate the [facility] provider you are looking for under [facility] and page to a number that you can be directly reached. 4. If you still have difficulty reaching the provider, please page the [facility] (Director on Call) for the Hospitalists listed on amion for assistance.

## 2019-01-10 NOTE — Progress Notes (Signed)
RN paged Dr. Lovell Sheehan to make him aware that patient was caught drinking coffee and smoking this morning by security guard.  Awaiting response.  P.J. Henderson Newcomer, RN

## 2019-01-10 NOTE — Progress Notes (Signed)
Patient left hospital AMA.  P.J. Henderson Newcomer, RN

## 2019-01-10 NOTE — Progress Notes (Signed)
Patient signed AMA form and was given back his cigarettes and lighter that were taken by Security.  IV removed with catheter intact.  Patient states he is calling his ride and planning to leave.  RN assisted patient in gathering his belongings.  P.J. Henderson Newcomer, RN

## 2019-01-13 ENCOUNTER — Other Ambulatory Visit: Payer: Self-pay

## 2019-01-13 ENCOUNTER — Encounter (HOSPITAL_COMMUNITY): Payer: Self-pay | Admitting: Emergency Medicine

## 2019-01-13 ENCOUNTER — Inpatient Hospital Stay (HOSPITAL_COMMUNITY)
Admission: EM | Admit: 2019-01-13 | Discharge: 2019-01-15 | DRG: 390 | Discharge status: Against Medical Advice | Payer: Medicare PPO | Attending: Internal Medicine | Admitting: Internal Medicine

## 2019-01-13 ENCOUNTER — Emergency Department (HOSPITAL_COMMUNITY): Payer: Medicare PPO

## 2019-01-13 DIAGNOSIS — E860 Dehydration: Secondary | ICD-10-CM | POA: Diagnosis present

## 2019-01-13 DIAGNOSIS — E875 Hyperkalemia: Secondary | ICD-10-CM | POA: Diagnosis not present

## 2019-01-13 DIAGNOSIS — D72829 Elevated white blood cell count, unspecified: Secondary | ICD-10-CM | POA: Diagnosis not present

## 2019-01-13 DIAGNOSIS — Z720 Tobacco use: Secondary | ICD-10-CM | POA: Diagnosis present

## 2019-01-13 DIAGNOSIS — F1010 Alcohol abuse, uncomplicated: Secondary | ICD-10-CM | POA: Diagnosis present

## 2019-01-13 DIAGNOSIS — K219 Gastro-esophageal reflux disease without esophagitis: Secondary | ICD-10-CM | POA: Diagnosis present

## 2019-01-13 DIAGNOSIS — Z79899 Other long term (current) drug therapy: Secondary | ICD-10-CM

## 2019-01-13 DIAGNOSIS — F17210 Nicotine dependence, cigarettes, uncomplicated: Secondary | ICD-10-CM | POA: Diagnosis present

## 2019-01-13 DIAGNOSIS — K7460 Unspecified cirrhosis of liver: Secondary | ICD-10-CM | POA: Diagnosis present

## 2019-01-13 DIAGNOSIS — K56600 Partial intestinal obstruction, unspecified as to cause: Secondary | ICD-10-CM | POA: Diagnosis not present

## 2019-01-13 DIAGNOSIS — R112 Nausea with vomiting, unspecified: Secondary | ICD-10-CM | POA: Diagnosis not present

## 2019-01-13 DIAGNOSIS — D649 Anemia, unspecified: Secondary | ICD-10-CM | POA: Diagnosis present

## 2019-01-13 DIAGNOSIS — K56609 Unspecified intestinal obstruction, unspecified as to partial versus complete obstruction: Secondary | ICD-10-CM | POA: Diagnosis present

## 2019-01-13 DIAGNOSIS — G894 Chronic pain syndrome: Secondary | ICD-10-CM | POA: Diagnosis present

## 2019-01-13 DIAGNOSIS — R140 Abdominal distension (gaseous): Secondary | ICD-10-CM

## 2019-01-13 DIAGNOSIS — B182 Chronic viral hepatitis C: Secondary | ICD-10-CM | POA: Diagnosis present

## 2019-01-13 LAB — COMPREHENSIVE METABOLIC PANEL
ALT: 13 U/L (ref 0–44)
AST: 20 U/L (ref 15–41)
Albumin: 2.7 g/dL — ABNORMAL LOW (ref 3.5–5.0)
Alkaline Phosphatase: 65 U/L (ref 38–126)
Anion gap: 10 (ref 5–15)
BUN: 13 mg/dL (ref 6–20)
CO2: 27 mmol/L (ref 22–32)
Calcium: 8.8 mg/dL — ABNORMAL LOW (ref 8.9–10.3)
Chloride: 97 mmol/L — ABNORMAL LOW (ref 98–111)
Creatinine, Ser: 1.05 mg/dL (ref 0.61–1.24)
GFR calc Af Amer: >60 mL/min (ref >60)
GFR calc non Af Amer: >60 mL/min (ref >60)
Glucose, Bld: 134 mg/dL — ABNORMAL HIGH (ref 70–99)
Potassium: 5.3 mmol/L — ABNORMAL HIGH (ref 3.5–5.1)
Sodium: 134 mmol/L — ABNORMAL LOW (ref 135–145)
Total Bilirubin: 0.4 mg/dL (ref 0.3–1.2)
Total Protein: 6.6 g/dL (ref 6.5–8.1)

## 2019-01-13 LAB — URINALYSIS, ROUTINE W REFLEX MICROSCOPIC
Bilirubin Urine: NEGATIVE
Glucose, UA: NEGATIVE mg/dL
Hgb urine dipstick: NEGATIVE
Ketones, ur: NEGATIVE mg/dL
Leukocytes,Ua: NEGATIVE
Nitrite: NEGATIVE
Protein, ur: NEGATIVE mg/dL
Specific Gravity, Urine: 1.01 (ref 1.005–1.030)
pH: 7 (ref 5.0–8.0)

## 2019-01-13 LAB — BASIC METABOLIC PANEL
Anion gap: 8 (ref 5–15)
BUN: 11 mg/dL (ref 6–20)
CO2: 24 mmol/L (ref 22–32)
Calcium: 7.8 mg/dL — ABNORMAL LOW (ref 8.9–10.3)
Chloride: 102 mmol/L (ref 98–111)
Creatinine, Ser: 0.96 mg/dL (ref 0.61–1.24)
GFR calc Af Amer: >60 mL/min (ref >60)
GFR calc non Af Amer: >60 mL/min (ref >60)
Glucose, Bld: 113 mg/dL — ABNORMAL HIGH (ref 70–99)
Potassium: 4.9 mmol/L (ref 3.5–5.1)
Sodium: 134 mmol/L — ABNORMAL LOW (ref 135–145)

## 2019-01-13 LAB — CBC WITH DIFFERENTIAL/PLATELET
Abs Immature Granulocytes: 0.07 10*3/uL (ref 0.00–0.07)
Basophils Absolute: 0.1 10*3/uL (ref 0.0–0.1)
Basophils Relative: 0 %
Eosinophils Absolute: 0 10*3/uL (ref 0.0–0.5)
Eosinophils Relative: 0 %
HCT: 41.9 % (ref 39.0–52.0)
Hemoglobin: 13.4 g/dL (ref 13.0–17.0)
Immature Granulocytes: 1 %
Lymphocytes Relative: 14 %
Lymphs Abs: 2.2 10*3/uL (ref 0.7–4.0)
MCH: 28 pg (ref 26.0–34.0)
MCHC: 32 g/dL (ref 30.0–36.0)
MCV: 87.5 fL (ref 80.0–100.0)
Monocytes Absolute: 0.8 10*3/uL (ref 0.1–1.0)
Monocytes Relative: 5 %
Neutro Abs: 12.3 10*3/uL — ABNORMAL HIGH (ref 1.7–7.7)
Neutrophils Relative %: 80 %
Platelets: 607 10*3/uL — ABNORMAL HIGH (ref 150–400)
RBC: 4.79 MIL/uL (ref 4.22–5.81)
RDW: 16 % — ABNORMAL HIGH (ref 11.5–15.5)
WBC: 15.5 10*3/uL — ABNORMAL HIGH (ref 4.0–10.5)
nRBC: 0 % (ref 0.0–0.2)

## 2019-01-13 LAB — TYPE AND SCREEN
ABO/RH(D): A POS
Antibody Screen: NEGATIVE

## 2019-01-13 LAB — RAPID URINE DRUG SCREEN, HOSP PERFORMED
Amphetamines: NOT DETECTED
Barbiturates: NOT DETECTED
Benzodiazepines: NOT DETECTED
Cocaine: POSITIVE — AB
Opiates: NOT DETECTED
Tetrahydrocannabinol: NOT DETECTED

## 2019-01-13 LAB — PROTIME-INR
INR: 0.8 (ref 0.8–1.2)
Prothrombin Time: 11.4 seconds (ref 11.4–15.2)

## 2019-01-13 LAB — LACTIC ACID, PLASMA: Lactic Acid, Venous: 1.6 mmol/L (ref 0.5–1.9)

## 2019-01-13 LAB — MAGNESIUM: Magnesium: 1.8 mg/dL (ref 1.7–2.4)

## 2019-01-13 MED ORDER — SODIUM CHLORIDE 0.9 % IV BOLUS
1000.0000 mL | Freq: Once | INTRAVENOUS | Status: AC
  Administered 2019-01-13: 1000 mL via INTRAVENOUS

## 2019-01-13 MED ORDER — ONDANSETRON HCL 4 MG/2ML IJ SOLN: 4.0000 mg | Freq: Four times a day (QID) | INTRAMUSCULAR | Status: DC | PRN

## 2019-01-13 MED ORDER — FENTANYL CITRATE (PF) 100 MCG/2ML IJ SOLN
50.0000 ug | Freq: Once | INTRAMUSCULAR | Status: AC
  Administered 2019-01-13: 50 ug via INTRAVENOUS
  Filled 2019-01-13: qty 2

## 2019-01-13 MED ORDER — ONDANSETRON HCL 4 MG/2ML IJ SOLN
4.0000 mg | Freq: Once | INTRAMUSCULAR | Status: AC
  Administered 2019-01-13: 4 mg via INTRAVENOUS
  Filled 2019-01-13: qty 2

## 2019-01-13 MED ORDER — PIPERACILLIN-TAZOBACTAM 3.375 G IVPB
3.3750 g | Freq: Three times a day (TID) | INTRAVENOUS | Status: DC
  Administered 2019-01-13 – 2019-01-14 (×3): 3.375 g via INTRAVENOUS
  Filled 2019-01-13 (×3): qty 50

## 2019-01-13 MED ORDER — SODIUM CHLORIDE 0.9 % IV SOLN
INTRAVENOUS | Status: DC
  Administered 2019-01-13 – 2019-01-14 (×3): via INTRAVENOUS

## 2019-01-13 MED ORDER — HYDROMORPHONE HCL 1 MG/ML IJ SOLN
0.5000 mg | INTRAMUSCULAR | Status: DC | PRN
  Administered 2019-01-13 – 2019-01-15 (×12): 0.5 mg via INTRAVENOUS
  Filled 2019-01-13 (×8): qty 0.5
  Filled 2019-01-13: qty 1
  Filled 2019-01-13 (×3): qty 0.5

## 2019-01-13 MED ORDER — PIPERACILLIN-TAZOBACTAM 3.375 G IVPB 30 MIN
3.3750 g | Freq: Once | INTRAVENOUS | Status: AC
  Administered 2019-01-13: 15:00:00 3.375 g via INTRAVENOUS
  Filled 2019-01-13: qty 50

## 2019-01-13 NOTE — H&P (Signed)
History and Physical    PLEASE NOTE THAT DRAGON DICTATION SOFTWARE WAS USED IN THE CONSTRUCTION OF THIS NOTE.   [name] OZD:[id number] DOB: 03-30-1966 DOA: 01/13/2019  PCP: Oval Linsey, MD Patient coming from: home  I have personally briefly reviewed patient's old medical records in [facility]  Chief Complaint: abdominal   HPI: [name] is a 53 y.o. male with medical history significant for recurrent small bowel obstruction, chronic iron deficiency anemia with baseline hemoglobin 9-10, who is admitted to [facility] on 01/13/2019 with small bowel obstruction and presenting from home to [facility] ED complaining of abdominal pain.  The following history is obtained via my discussions with the patient as well as my discussions with the ED physician and via chart review.  The patient has been hospitalized multiple times over the last month for recurrent small bowel obstruction, with the patient reportedly leaving AMA from his most recent 2 hospitalizations.  Most recently, he was hospitalized from 01/08/2019 to 01/10/19 which his SBO, at which time he left AGAINST MEDICAL ADVICE.  Exploratory laparotomy was reportedly being considered at the time that the patient left AMA.  Following this, the patient reports worsening diffuse abdominal pain, which he reports is most prominent in the left lower abdominal quadrant, prompting him to return to any [facility] emergency department today for further evaluation.  Abdominal pain worsens with movement and with palpation. he also notes progression in his nausea, associated with 2 episodes of nonbloody, nonbilious emesis earlier today.  He reports that he attempted to eat breakfast around 10 AM this morning, but shortly thereafter experienced further exacerbation of his abdominal discomfort as well as nausea/vomiting.  He reports diminished flatus production.  Denies any associated subjective fever, chills, or rigors.  Denies any trauma in the interval  since leaving AMA on 01/10/2019.  He denies any associated chest pain, shortness of breath, palpitations, diaphoresis, cough, or sore throat.  The patient denies any known or suspected COVID-19 exposures.    ED Course: Vital signs in the emergency department were notable for the following: Temperature max 98; initial heart rate 131, which improved to 109 following interval IV fluids as well as analgesic intervention, as further described below; initial blood pressure 155/104, which decreased to 125/97 following IV pain medication; respiratory rate 15-19, and oxygen saturation 97 to 100% on room air.  Labs performed in the ED were notable for the following: CMP notable for the following: Sodium 134, potassium 5.3, chloride 97, bicarbonate 27, creatinine 1.05 relative to 0.86 on 01/09/2019.  CBC notable for white blood cell count of 15,000 with 80% neutrophils, hemoglobin 13 compared to most recent prior value of 10.1 on 01/08/2019, platelets 607.  INR 0.8.  Lactic acid 1.6. one-view chest x-ray was performed to evaluate for free air under the diaphragm.  This CXR, per final radiology report, showed no acute cardiopulmonary process, and no evidence of free air under the diaphragm.   The ED physician, Dr. Hyacinth Meeker, discussed the patient's case with the on-call general surgeon, Dr. Lovell Sheehan, who recommended admission to the hospitalist service for further evaluation management of small bowel obstruction. Dr. Lovell Sheehan also recommended diet set to n.p.o. with ice chips, placement of NG tube, administration of Zosyn, with plan for Dr. Lovell Sheehan to see patient tomorrow (01/14/19).   While in the ED, the following were administered: Fentanyl 50 mcg IV x1, Zofran 4 mg IV x1, Zosyn 3.375 g IV x1, and a 1 L IV normal saline bolus.  Subsequently, the patient was admitted to the med telemetry floor for further evaluation management of presenting small bowel obstruction.    Review of Systems: As per HPI otherwise 10 point  review of systems negative.   Past Medical History:  Diagnosis Date   Acute renal failure (HCC)    Secondary to rhabdo. Treated with dialysis short-term.   Alcohol abuse    Psychiatric admissions for alcohol and drug abuse   Back pain, chronic    Chronic leg pain    Chronic pain syndrome    Cirrhosis (HCC)    Depression    Multiple psychiatric admissions   Hepatitis C    History of cocaine abuse (HCC)    Hyponatremia    Low TSH level January 2013   Normal free T4 of 1.06   Myositis    Left deltoid biopsy at [facility] 10/11/11.   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 He has a 40.00 pack-year smoking history. He has never used smokeless tobacco. He reports current alcohol use. He reports current drug use. Drugs: Cocaine and Marijuana.   Allergies  Allergen Reactions   Acetaminophen Other (See Comments)    Liver disease     Family History  Problem Relation Age of Onset   Hypertension Mother    Cancer Father        throat cancer    Diabetes Brother      Prior to Admission medications   Medication Sig Start Date End Date Taking? Authorizing Provider  diphenhydramine-acetaminophen (TYLENOL PM) 25-500 MG TABS tablet Take 1 tablet by mouth at bedtime as needed for pain.   Yes [provider]  Ferrous Sulfate (IRON) 325 (65 Fe) MG TABS Take 1 tablet by mouth 2 (two) times daily. 12/25/18  Yes [provider]  gabapentin (NEURONTIN) 300 MG capsule Take 300 mg by mouth 3 (three) times daily. 12/25/18  Yes [provider]      Objective    Physical Exam: Vitals:   01/13/19 1407 01/13/19 1408 01/13/19 1432  BP: (!) 159/109  (!) 155/114 (!) 155/104  Pulse: (!) 133 (!) 130 (!) 129  Resp: Temp: 98 F (36.7 C)    TempSrc: Oral    SpO2: 97% 99% 98%  Weight:  54 kg   Height:   (1.702 m)     General: appears to be stated age; alert, oriented Skin: warm, dry, no rash Head:  AT/[place] Eyes:  PEARL b/l, EOMI Mouth:  Oral mucosa membranes appear dry, normal dentition Neck: supple; trachea midline Heart:  Mildly tachycardic, regular; did not appreciate any M/R/G Lungs: CTAB, did not appreciate any wheezes, rales, or rhonchi Abdomen: hypoactive BS; soft, diffusely tender, most prominent over LLQ, but in the absence of guarding, rigidity, or rebound tenderness.  Extremities: no peripheral edema, no muscle wasting   Labs on Admission: I have personally reviewed following labs and imaging studies  CBC: Recent Labs  Lab 01/06/19 2056 01/07/19 0711 01/08/19 1122 01/13/19 1455  WBC 12.0* 7.6 5.7 15.5*  NEUTROABS 9.4*  --  2.4 12.3*  HGB 11.2* 9.8*  10.1* 13.4  HCT 34.9* 32.2* 32.9* 41.9  MCV 87.5 92.5 90.6 87.5  PLT 355 317 382 607*   Basic Metabolic Panel: Recent Labs  Lab 01/06/19 2056 01/07/19 0711 01/08/19 1122 01/09/19 0509 01/13/19 1455  NA 134* 134* 132* 134* 134*  K 4.7 4.8 4.6 4.3 5.3*  CL 97* 102 100 104 97*  CO2 GLUCOSE 138* 100* 125* 91 134*  BUN CREATININE 0.82 0.80 0.97 0.86 1.05  CALCIUM 8.6* 7.7* 8.2* 8.1* 8.8*  MG  --   --   --  1.9  --    GFR: Estimated Creatinine Clearance: 63.6 mL/min (by C-G formula based on SCr of 1.05 mg/dL). Liver Function Tests: Recent Labs  Lab 01/06/19 2056 01/07/19 0711 01/08/19 1122 01/09/19 0509 01/13/19 1455  AST 15 12* 15 11* 20  ALT ALKPHOS 51 45 49 50 65  BILITOT 0.3 0.1* 0.3 0.3 0.4  PROT 5.8* 5.0* 5.6* 5.0* 6.6  ALBUMIN 2.3* 2.0* 2.3* 2.1* 2.7*   Recent Labs  Lab 01/06/19 2056  LIPASE 33   Recent Labs  Lab 01/08/19 1324  AMMONIA 19   Coagulation Profile: Recent  Labs  Lab 01/09/19 1433 01/13/19 1455  INR 1.0 0.8   Cardiac Enzymes: No results for input(s): CKTOTAL, CKMB, CKMBINDEX, TROPONINI in the last 168 hours. BNP (last 3 results) No results for input(s): PROBNP in the last 8760 hours. HbA1C: No results for input(s): HGBA1C in the last 72 hours. CBG: No results for input(s): GLUCAP in the last 168 hours. Lipid Profile: No results for input(s): CHOL, HDL, LDLCALC, TRIG, CHOLHDL, LDLDIRECT in the last 72 hours. Thyroid Function Tests: No results for input(s): TSH, T4TOTAL, FREET4, T3FREE, THYROIDAB in the last 72 hours. Anemia Panel: No results for input(s): VITAMINB12, FOLATE, FERRITIN, TIBC, IRON, RETICCTPCT in the last 72 hours. Urine analysis:    Component Value Date/Time   COLORURINE YELLOW 11/09/2018 0752   APPEARANCEUR CLEAR 11/09/2018 0752   LABSPEC 1.011 11/09/2018 0752   PHURINE 6.0 11/09/2018 0752   GLUCOSEU NEGATIVE 11/09/2018 0752   HGBUR NEGATIVE 11/09/2018 0752   BILIRUBINUR NEGATIVE 11/09/2018 0752   KETONESUR NEGATIVE 11/09/2018 0752   PROTEINUR NEGATIVE 11/09/2018 0752   UROBILINOGEN 0.2 07/24/2014 1725   NITRITE NEGATIVE 11/09/2018 0752   LEUKOCYTESUR NEGATIVE 11/09/2018 0752    Radiological Exams on Admission: Dg Chest Port 1 View  Result Date: 01/13/2019 CLINICAL DATA:  53 year old male with abdominal pain EXAM: PORTABLE CHEST 1 VIEW COMPARISON:  01/07/2019, 11/09/2018 FINDINGS: Cardiomediastinal silhouette within normal limits. No evidence of central vascular congestion. No pneumothorax or pleural effusion. No confluent airspace disease. 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 IMPRESSION: Negative for acute cardiopulmonary disease. Nonunion fractures of left lower ribs Electronically Signed   By: Gilmer Mor D.O.   On: 01/13/2019 15:17     Assessment/Plan   [name] is a 53 y.o. male with medical history significant for recurrent small bowel obstruction, chronic iron deficiency anemia with baseline  hemoglobin 9-10, who is admitted to [facility] on 01/13/2019 with small bowel obstruction and presenting from home to [facility] ED complaining of abdominal pain.   Principal Problem:   SBO (small bowel obstruction) (HCC) Active Problems:   Tobacco abuse   Hyperkalemia   Leukocytosis   Chronic anemia     #) Small Bowel Obstruction: in the setting of recurrent/chronic SBO for which the patient has left  the hospital AMA multiple times over the last few weeks, he returns back to the hospital today complaining of progression of abdominal pain, nausea/vomiting, consistent with his SBO. CXR demonstrates no evidence of free air under the diaphragm, while physical exam demonstrates no evidence of an acute surgical abdomen. The ED physician discussed the patient's case with the on-call general surgeon, Dr. Lovell SheehanJenkins, who recommended admission to the hospital service for further evaluation and management of small bowel obstruction, with associated recommendations for NPO with ice chips, placement of NG tube, administration of Zosyn, and plans to see patient tomorrow (01/14/19).   Plan: NPO with ice chips.  NG tube connected to low intermittent wall suction.  IV fluids.  PRN IV Dilaudid.  PRN IV Zofran.  Repeat BMP and CBC in the morning.     #) Hyperkalemia: Presenting labs reflect mildly elevated serum potassium of 5.3, which appears to be on the basis of presenting dehydration in the setting of recent diminished oral intake as well as increase in GI losses in the setting of small bowel obstruction.   Plan: IV fluids, with repeat BMP at 2300 today.  Monitor on telemetry.  Will add on serum magnesium level to labs are to collected in the ED.     #) Leukocytosis: CBC reflects white blood cell count of 15,000 with 80% neutrophils.  Suspect that this elevation is multifactorial in nature, with inflammatory contribution stemming from presenting small bowel obstruction as well as consequence of  hemoconcentration in the setting of presenting dehydration, with CBC also reflecting elevation in hemoglobin and platelet values relative to baseline and consistent with a hemoconcentrated process.  No evidence of underlying infectious process at this time, including chest x-ray, which shows no evidence of infiltrate.  Plan: Check urinalysis.  As Zosyn is being initiated per request of general surgery, will seen blood cultures x2 at this time.  Repeat CBC with differential in the morning.      #) Tobacco abuse: Patient reports that he is a current smoker, having smoked 1 pack/day over the last 40 years.  He conveys that he has no desire to stop or reduce his smoking habits, and politely refuses my offer for nicotine patch during this hospitalization.  Plan: Counseled the patient on the importance of complete smoking discontinuation.     #) Chronic anemia: Per chart review, the patient has a documented history of chronic iron deficiency anemia, with baseline hemoglobin noted to be 9-10.  Presenting hemoglobin noted to be 13 relative to most recent prior value of 10.1 on 01/08/2019, which appears to be a result of hemo-concentrating impact stemming from concomitant presenting dehydration, as further described above.  INR noted to be 0.8.  He is prescribed oral iron supplementation as an outpatient.  Plan: Hold outpatient oral iron supplementation in the setting of current n.p.o. status.  Repeat CBC in the morning.    DVT prophylaxis: scd's Code Status: full Family Communication: (none) Disposition Plan:  Per Rounding Team Consults called: general surgery (Dr. Lovell SheehanJenkins).  Admission status: inpatient; med-tele.    PLEASE NOTE THAT DRAGON DICTATION SOFTWARE WAS USED IN THE CONSTRUCTION OF THIS NOTE.   Angie FavaJustin B Niema Carrara DO [facility] Pager [phone] From 3PM- 11PM.   Otherwise, please contact night-coverage  [email or web address] Password [email or web address]  01/13/2019, 4:05 PM

## 2019-01-13 NOTE — ED Notes (Signed)
ED Provider at bedside. 

## 2019-01-13 NOTE — ED Triage Notes (Signed)
Pt reports he has had abd pain for quite some time and was admitted for "obstruction" but got mad and went and drank a cup of coffee and they told him he could not have surgery so he signed out AMA.

## 2019-01-13 NOTE — Progress Notes (Signed)
Patient refusing NG tube at this time. Mid Level notified.

## 2019-01-13 NOTE — ED Provider Notes (Signed)
[facility] EMERGENCY DEPARTMENT Provider Note   CSN: [id number] Arrival date & time: 01/13/19  1400    History   Chief Complaint Chief Complaint  Patient presents with  . Abdominal Pain    HPI [name] is a 53 y.o. male.     HPI  The pt has had multiple evaluations for recurrent small bowel obstructions over the last months - [facility] and here - most recently 4/20, then 4/22 and left AMA 4/24 prior to an ex lap after he had went out to drink coffee and smoke - he is back with more pain and vomiting - no fevers - states pain is severe and mid abdominal - similar to what he had at his last visit - he has a hx of polysubstance abuse and frequent AMA visits.  Last meal was 10:30 when he had a biscuit - vomitted an hour later.    Past Medical History:  Diagnosis Date  . Acute renal failure (HCC)    Secondary to rhabdo. Treated with dialysis short-term.  . Alcohol abuse    Psychiatric admissions for alcohol and drug abuse  . Back pain, chronic   . Chronic leg pain   . Chronic pain syndrome   . Cirrhosis (HCC)   . Depression    Multiple psychiatric admissions  . Hepatitis C   . History of cocaine abuse (HCC)   . Hyponatremia   . Low TSH level January 2013   Normal free T4 of 1.06  . Myositis    Left deltoid biopsy at [facility] 10/11/11.  . 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 Polysubstance abuse (HCC)   . Rhabdomyolysis   . Small bowel obstruction (HCC)   . Tattoos   . Thrombocytopenia (HCC)   . Tobacco abuse     Patient Active Problem List   Diagnosis Date Noted  . Anemia, chronic disease 01/08/2019  . Severe protein-calorie malnutrition (HCC) 01/07/2019  . History of alcohol use   . Benign essential HTN   . Depression   . SBO (small bowel obstruction) (HCC) 11/09/2018  . Chronic low back pain 09/22/2015  . Bulging lumbar disc 09/22/2015  . Chronic hepatitis C (HCC) 09/22/2015  . Alcohol dependence (HCC) 06/30/2014  . Polysubstance abuse (HCC) 05/02/2013  . Nausea and  vomiting in adult patient 05/02/2013  . Generalized muscle ache 09/06/2012  . 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 Leukocytosis 09/06/2012  . Muscle weakness (generalized) 09/06/2012  . UTI (urinary tract infection) 09/06/2012  . Hyperkalemia 05/28/2012  . Rhabdomyolysis 05/28/2012  . Acute renal failure (HCC) 05/28/2012  . Hyponatremia 05/28/2012  . Elevated liver enzymes 05/28/2012  . Metabolic encephalopathy 12/11/2011  . Overdose 12/11/2011  . Encephalopathy 10/17/2011  . Tachycardia 10/17/2011  . Dehydration 10/17/2011  . Hypernatremia 10/17/2011  . Hepatitis C 10/17/2011  . Elevated LFTs 10/17/2011  . Alcohol abuse 10/17/2011  . Alcohol intoxication (HCC) 10/17/2011  . Muscular disease 10/17/2011  . Chronic pain syndrome 10/17/2011  . BACK PAIN 06/17/2010  . Myalgia and myositis 06/17/2010  . LEG PAIN, RIGHT 06/17/2010  . Tobacco abuse 11/25/2009  . HEPATITIS C 09/18/1996    Past Surgical History:  Procedure Laterality Date  . APPENDECTOMY    . HERNIA REPAIR    . MUSCLE BIOPSY          Home Medications    Prior to Admission medications   Medication Sig Start Date End Date Taking? Authorizing Provider  carvedilol (COREG) 6.25 MG tablet Take 1 tablet (6.25 mg total)  by mouth 2 (two) times daily with a meal. Patient not taking: Reported on 01/08/2019 11/12/18   Vassie LollMadera, Carlos, MD  diphenhydramine-acetaminophen (TYLENOL PM) 25-500 MG TABS tablet Take 1 tablet by mouth at bedtime as needed for pain.    [provider]  Ferrous Sulfate (IRON) 325 (65 Fe) MG TABS Take 1 tablet by mouth 2 (two) times daily. 12/25/18   [provider]  folic acid (FOLVITE) 1 MG tablet Take 1 tablet (1 mg total) by mouth daily. Patient not taking: Reported on 01/08/2019 11/13/18   Vassie LollMadera, Carlos, MD  gabapentin (NEURONTIN) 300 MG capsule Take 300 mg by mouth 3 (three) times daily. 12/25/18   [provider]  lactulose (CHRONULAC) 10 GM/15ML solution Take 15 mLs (10 g  total) by mouth 2 (two) times daily as needed (to achieve 2-3 BM's daily; hold if having desired amount of BM's without use of lactulose.). Patient not taking: Reported on 01/08/2019 11/12/18   Vassie LollMadera, Carlos, MD  mirtazapine (REMERON) 30 MG tablet Take 1 tablet (30 mg total) by mouth at bedtime. Patient not taking: Reported on 01/08/2019 11/12/18   Vassie LollMadera, Carlos, MD  nicotine (NICODERM CQ - DOSED IN MG/24 HOURS) 21 mg/24hr patch Place 1 patch (21 mg total) onto the skin daily. Patient not taking: Reported on 01/08/2019 11/13/18   Vassie LollMadera, Carlos, MD  pantoprazole (PROTONIX) 40 MG tablet Take 1 tablet (40 mg total) by mouth daily. Patient not taking: Reported on 01/08/2019 11/13/18   Vassie LollMadera, Carlos, MD  thiamine 100 MG tablet Take 1 tablet (100 mg total) by mouth daily. Patient not taking: Reported on 01/08/2019 11/13/18   Vassie LollMadera, Carlos, MD    Family History Family History  Problem Relation Age of Onset  . Hypertension Mother   . Cancer Father        throat cancer   . Diabetes Brother     Social History Social History   Tobacco Use  . Smoking status: Current Every Day Smoker    Packs/day: 1.00    Years: 40.00    Pack years: 40.00    Types: Cigarettes  . Smokeless tobacco: Never Used  Substance Use Topics  . Alcohol use: Yes    Alcohol/week: 0.0 standard drinks  . Drug use: Yes    Types: Cocaine, Marijuana     Allergies   Acetaminophen   Review of Systems Review of Systems  All other systems reviewed and are negative.    Physical Exam Updated Vital Signs BP (!) 159/109 (BP Location: Right Arm)   Pulse (!) 133   Temp 98 F (36.7 C) (Oral)   Resp 18   Ht 1.702 m (5\' 7" )   Wt 54 kg   SpO2 97%   BMI 18.65 kg/m   Physical Exam Vitals signs and nursing note reviewed.  Constitutional:      General: He is not in acute distress.    Appearance: He is well-developed.  HENT:     Head: Normocephalic and atraumatic.     Mouth/Throat:     Pharynx: No oropharyngeal  exudate.  Eyes:     General:        Right eye: No discharge.        Left eye: No discharge.     Conjunctiva/sclera: Conjunctivae normal.     Pupils: Pupils are equal, round, and reactive to light.     Comments: Slight jaundice  Neck:     Musculoskeletal: Normal range of motion and neck supple.     Thyroid:  No thyromegaly.     Vascular: No JVD.  Cardiovascular:     Rate and Rhythm: Regular rhythm. Tachycardia present.     Heart sounds: Normal heart sounds. No murmur. No friction rub. No gallop.   Pulmonary:     Effort: Pulmonary effort is normal. No respiratory distress.     Breath sounds: Normal breath sounds. No wheezing or rales.  Abdominal:     General: Bowel sounds are absent. There is no distension.     Palpations: Abdomen is soft. There is no mass.     Tenderness: There is generalized abdominal tenderness. There is guarding and rebound.  Musculoskeletal: Normal range of motion.        General: No tenderness.  Lymphadenopathy:     Cervical: No cervical adenopathy.  Skin:    General: Skin is warm and dry.     Findings: No erythema or rash.  Neurological:     Mental Status: He is alert.     Coordination: Coordination normal.  Psychiatric:        Behavior: Behavior normal.      ED Treatments / Results  Labs (all labs ordered are listed, but only abnormal results are displayed) Labs Reviewed  LACTIC ACID, PLASMA  CBC WITH DIFFERENTIAL/PLATELET  COMPREHENSIVE METABOLIC PANEL  PROTIME-INR  TYPE AND SCREEN    EKG EKG Interpretation  Date/Time:  Monday January 13 2019 14:11:44 EDT Ventricular Rate:  125 PR Interval:    QRS Duration: 72 QT Interval:  297 QTC Calculation: 429 R Axis:   -46 Text Interpretation:  Sinus tachycardia LAD, consider left anterior fascicular block Anterior infarct, old Since last tracing rate faster Confirmed by Eber Hong ([id number]) on 01/13/2019 2:24:40 PM   Radiology No results found.  Procedures .Critical Care Performed by:  Eber Hong, MD Authorized by: Eber Hong, MD   Critical care provider statement:    Critical care time (minutes):  35   Critical care time was exclusive of:  Separately billable procedures and treating other patients and teaching time   Critical care was necessary to treat or prevent imminent or life-threatening deterioration of the following conditions:  Sepsis   Critical care was time spent personally by me on the following activities:  Blood draw for specimens, development of treatment plan with patient or surrogate, discussions with consultants, evaluation of patient's response to treatment, examination of patient, obtaining history from patient or surrogate, ordering and performing treatments and interventions, ordering and review of laboratory studies, ordering and review of radiographic studies, pulse oximetry, re-evaluation of patient's condition and review of old charts    Angiocath insertion Performed by: Vida Roller  Consent: Verbal consent obtained. Risks and benefits: risks, benefits and alternatives were discussed Time out: Immediately prior to procedure a "time out" was called to verify the correct patient, procedure, equipment, support staff and site/side marked as required.  Preparation: Patient was prepped and draped in the usual sterile fashion.  Vein Location: LUE  Yes - Ultrasound Guided  Gauge: 20  Normal blood return and flush without difficulty Patient tolerance: Patient tolerated the procedure well with no immediate complications.     (including critical care time)  Medications Ordered in ED Medications  fentaNYL (SUBLIMAZE) injection 50 mcg (has no administration in time range)  ondansetron (ZOFRAN) injection 4 mg (has no administration in time range)     Initial Impression / Assessment and Plan / ED Course  I have reviewed the triage vital signs and the nursing notes.  Pertinent labs &  imaging results that were available during my care of  the patient were reviewed by me and considered in my medical decision making (see chart for details).  Clinical Course as of Jan 12 1458  Mon Jan 13, 2019  1459 I had to place the IV - nursing could not obtaine - see procedure note.   [BM]    Clinical Course User Index [BM] Eber Hong, MD      The pt is tachycardic Upright chest to r/o free air Needs surgical consulatin and fluids -  Has likely advanced SBO - may be ischemic at this point Give pre op abx as well. Pt is critically ill  I personally interpretted the xray - no sub diaphragmatic free air - lungs clear.    Discussed with surgeon - agrees to see in consultation - appreciate Dr. Lovell Sheehan willingness to assist and care for patient -   Discussed with hospitalist - agreeable to admit.  Pt has ongoing tachycardia which is slightly improving - no fever, WBC 15,000 - all consistent with progressive process and severity of illness.  Airway stable, and protected.  Final Clinical Impressions(s) / ED Diagnoses   Final diagnoses:  SBO (small bowel obstruction) (HCC)      Eber Hong, MD 01/13/19 1626

## 2019-01-14 ENCOUNTER — Inpatient Hospital Stay (HOSPITAL_COMMUNITY): Payer: Medicare PPO

## 2019-01-14 DIAGNOSIS — D649 Anemia, unspecified: Secondary | ICD-10-CM | POA: Diagnosis present

## 2019-01-14 DIAGNOSIS — K56609 Unspecified intestinal obstruction, unspecified as to partial versus complete obstruction: Secondary | ICD-10-CM

## 2019-01-14 LAB — CBC WITH DIFFERENTIAL/PLATELET
Abs Immature Granulocytes: 0.06 10*3/uL (ref 0.00–0.07)
Basophils Absolute: 0.1 10*3/uL (ref 0.0–0.1)
Basophils Relative: 1 %
Eosinophils Absolute: 0.1 10*3/uL (ref 0.0–0.5)
Eosinophils Relative: 1 %
HCT: 37.8 % — ABNORMAL LOW (ref 39.0–52.0)
Hemoglobin: 11.5 g/dL — ABNORMAL LOW (ref 13.0–17.0)
Immature Granulocytes: 0 %
Lymphocytes Relative: 37 %
Lymphs Abs: 5 10*3/uL — ABNORMAL HIGH (ref 0.7–4.0)
MCH: 28.1 pg (ref 26.0–34.0)
MCHC: 30.4 g/dL (ref 30.0–36.0)
MCV: 92.4 fL (ref 80.0–100.0)
Monocytes Absolute: 0.7 10*3/uL (ref 0.1–1.0)
Monocytes Relative: 5 %
Neutro Abs: 7.5 10*3/uL (ref 1.7–7.7)
Neutrophils Relative %: 56 %
Platelets: 545 10*3/uL — ABNORMAL HIGH (ref 150–400)
RBC: 4.09 MIL/uL — ABNORMAL LOW (ref 4.22–5.81)
RDW: 16.1 % — ABNORMAL HIGH (ref 11.5–15.5)
WBC: 13.5 10*3/uL — ABNORMAL HIGH (ref 4.0–10.5)
nRBC: 0 % (ref 0.0–0.2)

## 2019-01-14 LAB — BASIC METABOLIC PANEL
Anion gap: 6 (ref 5–15)
BUN: 12 mg/dL (ref 6–20)
CO2: 23 mmol/L (ref 22–32)
Calcium: 7.7 mg/dL — ABNORMAL LOW (ref 8.9–10.3)
Chloride: 104 mmol/L (ref 98–111)
Creatinine, Ser: 0.87 mg/dL (ref 0.61–1.24)
GFR calc Af Amer: >60 mL/min (ref >60)
GFR calc non Af Amer: >60 mL/min (ref >60)
Glucose, Bld: 98 mg/dL (ref 70–99)
Potassium: 4.3 mmol/L (ref 3.5–5.1)
Sodium: 133 mmol/L — ABNORMAL LOW (ref 135–145)

## 2019-01-14 LAB — MAGNESIUM: Magnesium: 1.9 mg/dL (ref 1.7–2.4)

## 2019-01-14 MED ORDER — FAMOTIDINE IN NACL 20-0.9 MG/50ML-% IV SOLN
20.0000 mg | INTRAVENOUS | Status: DC
  Administered 2019-01-14: 20 mg via INTRAVENOUS
  Filled 2019-01-14: qty 50

## 2019-01-14 MED ORDER — GABAPENTIN 300 MG PO CAPS
300.0000 mg | ORAL_CAPSULE | Freq: Three times a day (TID) | ORAL | Status: DC
  Filled 2019-01-14: qty 1

## 2019-01-14 MED ORDER — DEXTROSE IN LACTATED RINGERS 5 % IV SOLN
INTRAVENOUS | Status: DC
  Administered 2019-01-14: 17:00:00 via INTRAVENOUS

## 2019-01-14 NOTE — Progress Notes (Signed)
PROGRESS NOTE    [name]  HOO:[id number] DOB: [birth year] DOA: 01/13/2019 PCP: Oval Linsey, MD    Brief Narrative:  53 year old male who presented with abdominal pain, he does have significant past medical history for recurrent small bowel obstructions, GERD, cirrhosis, hepatitis C, alcohol and tobacco abuse. This is his third hospitalization for small bowel obstructions, he has been signing himself AGAINST MEDICAL ADVICE from the hospital, he left April 24 last time. AT home he developed recurrent diffuse abdominal pain, worsening, most prominent left lower quadrant, associated with nausea and vomiting.  His initial physical examination his blood pressure was 159/109, heart rate 133, respirate 18, temperature 98, oxygen saturation 97%, he had dry mucous membranes, heart S1-S2 present and tachycardic, patient, his abdomen was soft but diffusely tender more prominent left lower quadrant, no rebound or guarding, no lower extremity edema.  Sodium 134, potassium 4.9, chloride 102, bicarb 24, glucose 113, BUN 11, creatinine 0.96, white count 13.5, hemoglobin 11.5, hematocrit 37.8, platelets 545.  Toxicology screen was positive for cocaine.  His chest x-ray had left lower rib fractures, no infiltrates.  325 bpm, sinus rhythm, normal axis with normal intervals, J-point elevation in V2 to V3, Q waves V2-V3.   Patient was admitted to the hospital with working diagnosis of recurrent small bowel obstruction.    Assessment & Plan:   Principal Problem:   SBO (small bowel obstruction) (HCC) Active Problems:   Tobacco abuse   Hyperkalemia   Leukocytosis   Chronic anemia  1. Recurrent small bowel obstruction. Continue supportive medical therapy with IV fluids, as needed IV analgesics and antiemetics. Case discussed with Dr. Lovell Sheehan from surgery. Clear liquid diet for now. Will check abdominal film this am. No NG tube at this point. Patient with positive flatus but now bowel movement. Will add  antiacid therapy with famotidine.   2. Hyperkalemia. Improved serum K down to 4,3 this am, renal function preserved with serum cr at 0,87. Will continue maintenance IV fluids with dextrose and balanced electrolyte solution.   3. Chronic hepatitis C with cirrhosis. No signs of decompensation, will continue close monitoring.   4. Alcohol and tobacco abuse.  No signs of active withdrawal, will continue neuro checks per unit protocol.   5. Chronic multifactorial anemia. Cell count stable, Hgb at 11,5 and Hct at 37,8.   6. Reactive leukocytosis. No signs of deep infection, will discontinue Zosyn for now. Continue to follow cell count in am.    DVT prophylaxis: enoxaparin   Code Status:  full Family Communication: no family at the bedside  Disposition Plan/ discharge barriers: pending clinical improvement.   Body mass index is 18.44 kg/m. Malnutrition Type:      Malnutrition Characteristics:      Nutrition Interventions:     RN Pressure Injury Documentation:     Consultants:   Surgery   Procedures:     Antimicrobials:       Subjective: Patient continue to have abdominal pain, no nausea or vomiting, positive flatus but no bowel movement. No chest pain or dyspnea.   Objective: Vitals:   01/13/19 2009 01/13/19 2035 01/14/19 0500 01/14/19 0559  BP: (!) 138/95   (!) 130/96  Pulse: (!) 105   99  Resp:    16  Temp: 98 F (36.7 C)   97.7 F (36.5 C)  TempSrc: Oral   Oral  SpO2: 99% 96%  98%  Weight: 52.9 kg  53.4 kg   Height: 5\' 7"  (1.702 m)  Intake/Output Summary (Last 24 hours) at 01/14/2019 0917 Last data filed at 01/14/2019 0300 Gross per 24 hour  Intake 925 ml  Output -  Net 925 ml   Filed Weights   01/13/19 1408 01/13/19 2009 01/14/19 0500  Weight: 54 kg 52.9 kg 53.4 kg    Examination:   General: Not in pain or dyspnea, deconditioned  Neurology: Awake and alert, non focal  E ENT: mild pallor, no icterus, oral mucosa moist  Cardiovascular: No JVD. S1-S2 present, rhythmic, no gallops, rubs, or murmurs. No lower extremity edema. Pulmonary: positive breath sounds bilaterally, adequate air movement, no wheezing, rhonchi or rales. Gastrointestinal. Abdomen mild distended, no organomegaly. Tender to palpation but with no rebound or guarding Skin. No rashes Musculoskeletal: no joint deformities     Data Reviewed: I have personally reviewed following labs and imaging studies  CBC: Recent Labs  Lab 01/08/19 1122 01/13/19 1455 01/14/19 0605  WBC 5.7 15.5* 13.5*  NEUTROABS 2.4 12.3* 7.5  HGB 10.1* 13.4 11.5*  HCT 32.9* 41.9 37.8*  MCV 90.6 87.5 92.4  PLT 382 607* 545*   Basic Metabolic Panel: Recent Labs  Lab 01/08/19 1122 01/09/19 0509 01/13/19 1455 01/13/19 1906 01/13/19 2256 01/14/19 0605  NA 132* 134* 134*  --  134* 133*  K 4.6 4.3 5.3*  --  4.9 4.3  CL 100 104 97*  --  102 104  CO2 24 24 27   --  24 23  GLUCOSE 125* 91 134*  --  113* 98  BUN 12 8 13   --  11 12  CREATININE 0.97 0.86 1.05  --  0.96 0.87  CALCIUM 8.2* 8.1* 8.8*  --  7.8* 7.7*  MG  --  1.9  --  1.8  --  1.9   GFR: Estimated Creatinine Clearance: 75.9 mL/min (by C-G formula based on SCr of 0.87 mg/dL). Liver Function Tests: Recent Labs  Lab 01/08/19 1122 01/09/19 0509 01/13/19 1455  AST 15 11* 20  ALT 13 11 13   ALKPHOS 49 50 65  BILITOT 0.3 0.3 0.4  PROT 5.6* 5.0* 6.6  ALBUMIN 2.3* 2.1* 2.7*   No results for input(s): LIPASE, AMYLASE in the last 168 hours. Recent Labs  Lab 01/08/19 1324  AMMONIA 19   Coagulation Profile: Recent Labs  Lab 01/09/19 1433 01/13/19 1455  INR 1.0 0.8   Cardiac Enzymes: No results for input(s): CKTOTAL, CKMB, CKMBINDEX, TROPONINI in the last 168 hours. BNP (last 3 results) No results for input(s): PROBNP in the last 8760 hours. HbA1C: No results for input(s): HGBA1C in the last 72 hours. CBG: No results for input(s): GLUCAP in the last 168 hours. Lipid Profile: No results  for input(s): CHOL, HDL, LDLCALC, TRIG, CHOLHDL, LDLDIRECT in the last 72 hours. Thyroid Function Tests: No results for input(s): TSH, T4TOTAL, FREET4, T3FREE, THYROIDAB in the last 72 hours. Anemia Panel: No results for input(s): VITAMINB12, FOLATE, FERRITIN, TIBC, IRON, RETICCTPCT in the last 72 hours.    Radiology Studies: I have reviewed all of the imaging during this hospital visit personally     Scheduled Meds: Continuous Infusions: . sodium chloride 125 mL/hr at 01/14/19 0157  . piperacillin-tazobactam (ZOSYN)  IV 3.375 g (01/14/19 0631)     LOS: 1 day        Ayham Word Annett Gulaaniel Kinston Magnan, MD

## 2019-01-14 NOTE — Consult Note (Signed)
Reason for Consult: Abdominal pain, nausea, and vomiting Referring Physician: Dr. Tresea MallArrien  [name] is an 53 y.o. male.  HPI: Patient is a 53 year old white male well known to me who presents with nausea and vomiting.  He has left AMA twice in the past week after being scheduled for exploratory laparotomy.  CT scan of the abdomen in the past revealed a short segment of thickened bowel wall with near obstructing symptoms.  This has been a recurrent problem for him over the past months.  A urinalysis was performed which revealed positivity for cocaine.  States he used cocaine approximately 4 days ago.  He currently states he is hungry and has minimal abdominal pain.  Past Medical History:  Diagnosis Date  . Acute renal failure (HCC)    Secondary to rhabdo. Treated with dialysis short-term.  . Alcohol abuse    Psychiatric admissions for alcohol and drug abuse  . Back pain, chronic   . Chronic leg pain   . Chronic pain syndrome   . Cirrhosis (HCC)   . Depression    Multiple psychiatric admissions  . Hepatitis C   . History of cocaine abuse (HCC)   . Hyponatremia   . Low TSH level January 2013   Normal free T4 of 1.06  . Myositis    [facility].  . 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 Polysubstance abuse (HCC)   . Rhabdomyolysis   . Small bowel obstruction (HCC)   . Tattoos   . Thrombocytopenia (HCC)   . Tobacco abuse     Past Surgical History:  Procedure Laterality Date  . APPENDECTOMY    . HERNIA REPAIR    . MUSCLE BIOPSY      Family History  Problem Relation Age of Onset  . Hypertension Mother   . Cancer Father        throat cancer   . Diabetes Brother     Social History:  reports that he has been smoking cigarettes. He has a 40.00 pack-year smoking history. He has never used smokeless tobacco. He reports current alcohol use. He reports current drug use. Drugs: Cocaine and Marijuana.  Allergies:  Allergies  Allergen Reactions  . Acetaminophen  Other (See Comments)    Liver disease     Medications: I have reviewed the patient's current medications.  Results for orders placed or performed during the hospital encounter of 01/13/19 (from the past 48 hour(s))  CBC with Differential/Platelet     Status: Abnormal   Collection Time: 01/13/19  2:55 PM  Result Value Ref Range   WBC 15.5 (H) 4.0 - 10.5 K/uL   RBC 4.79 4.22 - 5.81 MIL/uL   Hemoglobin 13.4 13.0 - 17.0 g/dL   HCT 16.141.9 09.639.0 - 04.552.0 %   MCV 87.5 80.0 - 100.0 fL   MCH 28.0 26.0 - 34.0 pg   MCHC 32.0 30.0 - 36.0 g/dL   RDW 40.916.0 (H) 81.111.5 - 91.415.5 %   Platelets 607 (H) 150 - 400 K/uL   nRBC 0.0 0.0 - 0.2 %   Neutrophils Relative % 80 %   Neutro Abs 12.3 (H) 1.7 - 7.7 K/uL   Lymphocytes Relative 14 %   Lymphs Abs 2.2 0.7 - 4.0 K/uL   Monocytes Relative 5 %   Monocytes Absolute 0.8 0.1 - 1.0 K/uL   Eosinophils Relative 0 %   Eosinophils Absolute 0.0 0.0 - 0.5 K/uL   Basophils Relative 0 %   Basophils Absolute 0.1 0.0 -  0.1 K/uL   Immature Granulocytes 1 %   Abs Immature Granulocytes 0.07 0.00 - 0.07 K/uL    Comment: Performed at [facility], [address] [place], [place] [address]  Comprehensive metabolic panel     Status: Abnormal   Collection Time: 01/13/19  2:55 PM  Result Value Ref Range   Sodium 134 (L) 135 - 145 mmol/L   Potassium 5.3 (H) 3.5 - 5.1 mmol/L   Chloride 97 (L) 98 - 111 mmol/L   CO2 27 22 - 32 mmol/L   Glucose, Bld 134 (H) 70 - 99 mg/dL   BUN 13 6 - 20 mg/dL   Creatinine, Ser 1.91 0.61 - 1.24 mg/dL   Calcium 8.8 (L) 8.9 - 10.3 mg/dL   Total Protein 6.6 6.5 - 8.1 g/dL   Albumin 2.7 (L) 3.5 - 5.0 g/dL   AST 20 15 - 41 U/L   ALT 13 0 - 44 U/L   Alkaline Phosphatase 65 38 - 126 U/L   Total Bilirubin 0.4 0.3 - 1.2 mg/dL   GFR calc non Af Amer >60 >60 mL/min   GFR calc Af Amer >60 >60 mL/min   Anion gap 10 5 - 15    Comment: Performed at [facility], [address], [place], [place] [address]  Protime-INR     Status: None   Collection Time:  01/13/19  2:55 PM  Result Value Ref Range   Prothrombin Time 11.4 11.4 - 15.2 seconds   INR 0.8 0.8 - 1.2    Comment: (NOTE) INR goal varies based on device and disease states. Performed at [facility], [address] [place], [place] [address]   Lactic acid, plasma     Status: None   Collection Time: 01/13/19  3:31 PM  Result Value Ref Range   Lactic Acid, Venous 1.6 0.5 - 1.9 mmol/L    Comment: Performed at [facility], [address], [place], [place] [address]  Type and screen     Status: None   Collection Time: 01/13/19  3:31 PM  Result Value Ref Range   ABO/RH(D) A POS    Antibody Screen NEG    Sample Expiration      01/16/2019 Performed at [facility], [address] [place], [place] [address]   Rapid urine drug screen (hospital performed)     Status: Abnormal   Collection Time: 01/13/19  3:56 PM  Result Value Ref Range   Opiates NONE DETECTED NONE DETECTED   Cocaine POSITIVE (A) NONE DETECTED   Benzodiazepines NONE DETECTED NONE DETECTED   Amphetamines NONE DETECTED NONE DETECTED   Tetrahydrocannabinol NONE DETECTED NONE DETECTED   Barbiturates NONE DETECTED NONE DETECTED    Comment: (NOTE) DRUG SCREEN FOR MEDICAL PURPOSES ONLY.  IF CONFIRMATION IS NEEDED FOR ANY PURPOSE, NOTIFY LAB WITHIN 5 DAYS. LOWEST DETECTABLE LIMITS FOR URINE DRUG SCREEN Drug Class                     Cutoff (ng/mL) Amphetamine and metabolites    1000 Barbiturate and metabolites    200 Benzodiazepine                 200 Tricyclics and metabolites     300 Opiates and metabolites        300 Cocaine and metabolites        300 THC  50 Performed at [facility], [address], [place], [place] [address]   Urinalysis, Routine w reflex microscopic     Status: None   Collection Time: 01/13/19  3:56 PM  Result Value Ref Range   Color, Urine YELLOW YELLOW   APPearance CLEAR CLEAR   Specific Gravity, Urine 1.010 1.005 - 1.030   pH 7.0 5.0 - 8.0   Glucose, UA  NEGATIVE NEGATIVE mg/dL   Hgb urine dipstick NEGATIVE NEGATIVE   Bilirubin Urine NEGATIVE NEGATIVE   Ketones, ur NEGATIVE NEGATIVE mg/dL   Protein, ur NEGATIVE NEGATIVE mg/dL   Nitrite NEGATIVE NEGATIVE   Leukocytes,Ua NEGATIVE NEGATIVE    Comment: Performed at [facility], [address], [place], [place] [address]  Culture, blood (routine x 2)     Status: None (Preliminary result)   Collection Time: 01/13/19  6:56 PM  Result Value Ref Range   Specimen Description BLOOD LEFT HAND    Special Requests      BOTTLES DRAWN AEROBIC AND ANAEROBIC Blood Culture adequate volume Performed at [facility], [address] [place], [place] [address]    Culture PENDING    Report Status PENDING   Culture, blood (routine x 2)     Status: None (Preliminary result)   Collection Time: 01/13/19  7:06 PM  Result Value Ref Range   Specimen Description BLOOD    Special Requests      RIGHT ANTECUBITAL BOTTLES DRAWN AEROBIC AND ANAEROBIC Performed at [facility], [address], [place], [place] [address]    Culture PENDING    Report Status PENDING   Magnesium     Status: None   Collection Time: 01/13/19  7:06 PM  Result Value Ref Range   Magnesium 1.8 1.7 - 2.4 mg/dL    Comment: Performed at [facility], [address], [place], [place] [address]  Basic metabolic panel     Status: Abnormal   Collection Time: 01/13/19 10:56 PM  Result Value Ref Range   Sodium 134 (L) 135 - 145 mmol/L   Potassium 4.9 3.5 - 5.1 mmol/L   Chloride 102 98 - 111 mmol/L   CO2 24 22 - 32 mmol/L   Glucose, Bld 113 (H) 70 - 99 mg/dL   BUN 11 6 - 20 mg/dL   Creatinine, Ser 6.57 0.61 - 1.24 mg/dL   Calcium 7.8 (L) 8.9 - 10.3 mg/dL   GFR calc non Af Amer >60 >60 mL/min   GFR calc Af Amer >60 >60 mL/min   Anion gap 8 5 - 15    Comment: Performed at [facility], [address] [place], [place] [address]  Magnesium     Status: None   Collection Time: 01/14/19  6:05 AM  Result Value Ref Range   Magnesium 1.9 1.7 - 2.4  mg/dL    Comment: Performed at [facility], [address] [place], [place] [address]  Basic metabolic panel     Status: Abnormal   Collection Time: 01/14/19  6:05 AM  Result Value Ref Range   Sodium 133 (L) 135 - 145 mmol/L   Potassium 4.3 3.5 - 5.1 mmol/L   Chloride 104 98 - 111 mmol/L   CO2 23 22 - 32 mmol/L   Glucose, Bld 98 70 - 99 mg/dL   BUN 12 6 - 20 mg/dL   Creatinine, Ser 4.13 0.61 - 1.24 mg/dL   Calcium 7.7 (L) 8.9 - 10.3 mg/dL   GFR calc non Af Amer >60 >60 mL/min   GFR calc  Af Amer >60 >60 mL/min   Anion gap 6 5 - 15    Comment: Performed at [facility], [address] [place] [address], [place], [place] [address]  CBC WITH DIFFERENTIAL     Status: Abnormal   Collection Time: 01/14/19  6:05 AM  Result Value Ref Range   WBC 13.5 (H) 4.0 - 10.5 K/uL   RBC 4.09 (L) 4.22 - 5.81 MIL/uL   Hemoglobin 11.5 (L) 13.0 - 17.0 g/dL   HCT 57.8 (L) 46.9 - 62.9 %   MCV 92.4 80.0 - 100.0 fL   MCH 28.1 26.0 - 34.0 pg   MCHC 30.4 30.0 - 36.0 g/dL   RDW 52.8 (H) 41.3 - 24.4 %   Platelets 545 (H) 150 - 400 K/uL   nRBC 0.0 0.0 - 0.2 %   Neutrophils Relative % 56 %   Neutro Abs 7.5 1.7 - 7.7 K/uL   Lymphocytes Relative 37 %   Lymphs Abs 5.0 (H) 0.7 - 4.0 K/uL   Monocytes Relative 5 %   Monocytes Absolute 0.7 0.1 - 1.0 K/uL   Eosinophils Relative 1 %   Eosinophils Absolute 0.1 0.0 - 0.5 K/uL   Basophils Relative 1 %   Basophils Absolute 0.1 0.0 - 0.1 K/uL   Immature Granulocytes 0 %   Abs Immature Granulocytes 0.06 0.00 - 0.07 K/uL    Comment: Performed at [facility], [address], [place], [place] [address]    Dg Chest Port 1 View  Result Date: 01/13/2019 CLINICAL DATA:  53 year old male with abdominal pain EXAM: PORTABLE CHEST 1 VIEW COMPARISON:  01/07/2019, 11/09/2018 FINDINGS: Cardiomediastinal silhouette within normal limits. No evidence of central vascular congestion. No pneumothorax or pleural effusion. No confluent airspace disease. 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 IMPRESSION:  Negative for acute cardiopulmonary disease. Nonunion fractures of left lower ribs Electronically Signed   By: Gilmer Mor D.O.   On: 01/13/2019 15:17    ROS:  Pertinent items are noted in HPI.  Blood pressure (!) 130/96, pulse 99, temperature 97.7 F (36.5 C), temperature source Oral, resp. rate 16, height  (1.702 m), weight 53.4 kg, SpO2 98 %. Physical Exam: White male no acute distress Head is normocephalic, atraumatic Lungs clear to auscultation with equal breath sounds bilaterally Heart examination reveals a regular rate and rhythm without S3, S4, murmurs Abdomen is soft and flat with positive bowel sounds.  No specific tenderness is noted.  Assessment/Plan: Impression: Recurrent episodes of partial bowel obstruction with abdominal pain.  Urinalysis positive for cocaine this admission. Plan: I told the patient that I could not do an exploratory laparotomy until his urine tox screen is negative for cocaine.  I will start him on a full liquid diet to see how he progresses.  [name] 01/14/2019, 9:15 AM

## 2019-01-14 NOTE — TOC Initial Note (Signed)
Transition of Care [facility]) - Initial/Assessment Note    Patient Details  Name: [name] MRN: [id number] Date of Birth: September 03, 1966  Transition of Care [facility]) CM/SW Contact:    Malcolm Metro, RN Phone Number: 01/14/2019, 12:17 PM  Clinical Narrative:   This is patiens 3rd admission in a week. He has left AMA twice previously. Full assessment completed on 4/21, see note. Pt high risk for readmission d/t compliance. No TOC needs noted at this time. CM will sign off and may be consulted should needs arise.                 Expected Discharge Plan: Home/Self Care    Expected Discharge Plan and Services Expected Discharge Plan: Home/Self Care     Prior Living Arrangements/Services   Do you feel safe going back to the place where you live?: Yes      Need for Family Participation in Patient Care: No (Comment) Care giver support system in place?: Yes (comment)      Activities of Daily Living Home Assistive Devices/Equipment: None ADL Screening (condition at time of admission) Patient's cognitive ability adequate to safely complete daily activities?: Yes Is the patient deaf or have difficulty hearing?: No Does the patient have difficulty seeing, even when wearing glasses/contacts?: No Does the patient have difficulty concentrating, remembering, or making decisions?: No Patient able to express need for assistance with ADLs?: Yes Does the patient have difficulty dressing or bathing?: No Independently performs ADLs?: Yes (appropriate for developmental age) Does the patient have difficulty walking or climbing stairs?: No Weakness of Legs: Both Weakness of Arms/Hands: Both      Admission diagnosis:  SBO (small bowel obstruction) (HCC) [K56.609] Patient Active Problem List   Diagnosis Date Noted  . Chronic anemia 01/14/2019  . Anemia, chronic disease 01/08/2019  . Severe protein-calorie malnutrition (HCC) 01/07/2019  . History of alcohol use   . Benign essential HTN   .  Depression   . SBO (small bowel obstruction) (HCC) 11/09/2018  . Chronic low back pain 09/22/2015  . Bulging lumbar disc 09/22/2015  . Chronic hepatitis C (HCC) 09/22/2015  . Alcohol dependence (HCC) 06/30/2014  . Polysubstance abuse (HCC) 05/02/2013  . Nausea and vomiting in adult patient 05/02/2013  . Generalized muscle ache 09/06/2012  . 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 Leukocytosis 09/06/2012  . Muscle weakness (generalized) 09/06/2012  . UTI (urinary tract infection) 09/06/2012  . Hyperkalemia 05/28/2012  . Rhabdomyolysis 05/28/2012  . Acute renal failure (HCC) 05/28/2012  . Hyponatremia 05/28/2012  . Elevated liver enzymes 05/28/2012  . Metabolic encephalopathy 12/11/2011  . Overdose 12/11/2011  . Encephalopathy 10/17/2011  . Tachycardia 10/17/2011  . Dehydration 10/17/2011  . Hypernatremia 10/17/2011  . Hepatitis C 10/17/2011  . Elevated LFTs 10/17/2011  . Alcohol abuse 10/17/2011  . Alcohol intoxication (HCC) 10/17/2011  . Muscular disease 10/17/2011  . Chronic pain syndrome 10/17/2011  . BACK PAIN 06/17/2010  . Myalgia and myositis 06/17/2010  . LEG PAIN, RIGHT 06/17/2010  . Tobacco abuse 11/25/2009  . HEPATITIS C 09/18/1996   PCP:  Oval Linsey, MD Pharmacy:   [facility] [address], [place] - [address] [place] [id number] [place] [address] Phone: [phone] Fax: [phone]     Readmission Risk Interventions Readmission Risk Prevention Plan 01/07/2019  Transportation Screening Complete  HRI or Home Care Consult Complete  Social Work Consult for Recovery Care Planning/Counseling Complete  Palliative Care Screening Not Applicable  Medication Review Oceanographer) Complete  Some recent data might be hidden

## 2019-01-15 NOTE — Progress Notes (Signed)
Call to patents room at approximately 0105 the patient was pulling off telemetry monitor and asking for his IV to be removed.  Patient stated that he was going home so he could get a goodnights sleep.  I explained to the patient that he would be leaving against medical advise and that it could result in further health complications up to and including death.  Patient stated that he understood and that he was still leaving that his ride was waiting.  Removed patients IV.  Patient dressed himself, gathered his belongings, signed AMA papers, and left the floor with security.  On call MD notified of patients departure.

## 2019-01-18 LAB — CULTURE, BLOOD (ROUTINE X 2)
Culture: NO GROWTH
Culture: NO GROWTH
Special Requests: ADEQUATE

## 2019-01-18 NOTE — Discharge Summary (Signed)
Physician Discharge Summary  [name] XYI:[id number] DOB: 07/27/1966 DOA: 01/13/2019  PCP: Oval Linsey, MD  Admit date: 01/13/2019 Discharge date: 01/18/2019  Admitted From: Home  Disposition:  Left ama.    Brief/Interim Summary: 53 year old male who presented with abdominal pain, he does have significant past medical history for recurrent small bowel obstructions, GERD, cirrhosis, hepatitis C, alcohol and tobacco abuse. This is his third hospitalization for small bowel obstructions, he has been signing himself AGAINST MEDICAL ADVICE from the hospital, he left April 24 last time. AT home he developed recurrent diffuse abdominal pain, worsening, most prominent left lower quadrant, associated with nausea and vomiting.  His initial physical examination his blood pressure was 159/109, heart rate 133, respirate 18, temperature 98, oxygen saturation 97%, he had dry mucous membranes, heart S1-S2 present and tachycardic, patient, his abdomen was soft but diffusely tender more prominent left lower quadrant, no rebound or guarding, no lower extremity edema.  Sodium 134, potassium 4.9, chloride 102, bicarb 24, glucose 113, BUN 11, creatinine 0.96, white count 13.5, hemoglobin 11.5, hematocrit 37.8, platelets 545.  Toxicology screen was positive for cocaine.  His chest x-ray had left lower rib fractures, no infiltrates.  325 bpm, sinus rhythm, normal axis with normal intervals, J-point elevation in V2 to V3, Q waves V2-V3.   Patient was admitted to the hospital with working diagnosis of recurrent small bowel obstruction.   Patient was evaluated by the surgical service with Dr. Lovell Sheehan, he was placed on a conservative medical therapy.  Patient left AGAINST MEDICAL ADVICE.  For further details please see daily progress notes.  Discharge Diagnoses:  Principal Problem:   SBO (small bowel obstruction) (HCC) Active Problems:   Tobacco abuse   Hyperkalemia   Leukocytosis   Chronic anemia    Discharge  Instructions   Allergies as of 01/15/2019      Reactions   Acetaminophen Other (See Comments)   Liver disease       Medication List    ASK your doctor about these medications   diphenhydramine-acetaminophen 25-500 MG Tabs tablet Commonly known as:  TYLENOL PM Take 1 tablet by mouth at bedtime as needed for pain.   gabapentin 300 MG capsule Commonly known as:  NEURONTIN Take 300 mg by mouth 3 (three) times daily.   Iron 325 (65 Fe) MG Tabs Take 1 tablet by mouth 2 (two) times daily.       Allergies  Allergen Reactions  . Acetaminophen Other (See Comments)    Liver disease     Consultations:  Surgery    Procedures/Studies: Dg Chest 2 View  Result Date: 01/07/2019 CLINICAL DATA:  Preoperative evaluation EXAM: CHEST - 2 VIEW COMPARISON:  November 09, 2018 FINDINGS: There is no edema or consolidation. The heart size and pulmonary vascularity are normal. No adenopathy. There is an old healed fracture of the posterior right ninth rib. IMPRESSION: No edema or consolidation. Electronically Signed   By: Bretta Bang III M.D.   On: 01/07/2019 10:23   Dg Abd 1 View  Result Date: 01/14/2019 CLINICAL DATA:  Chronic abdominal pain with nausea and vomiting. EXAM: ABDOMEN - 1 VIEW COMPARISON:  01/06/2019 CT FINDINGS: Single supine view of the abdomen and pelvis. Gas-filled small bowel loops at up to 4.9 cm. Compare 4.1 cm on the scout view of the prior CT. Gas and stool within the ascending colon. No gross free intraperitoneal air. 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 IMPRESSION: Persistent small bowel dilatation, suspicious for partial small bowel obstruction.  Electronically Signed   By: Jeronimo Greaves M.D.   On: 01/14/2019 11:59   Ct Abdomen Pelvis W Contrast  Result Date: 01/06/2019 CLINICAL DATA:  Bowel obstruction.  Abdominal pain. EXAM: CT ABDOMEN AND PELVIS WITH CONTRAST TECHNIQUE: Multidetector CT imaging of the abdomen and pelvis was performed using the  standard protocol following bolus administration of intravenous contrast. CONTRAST:  OMNIPAQUE IOHEXOL 300 MG/ML  SOLN COMPARISON:  11/09/2018 FINDINGS: Lower chest: Lung bases are clear. No effusions. Heart is normal size. Hepatobiliary: Small hypodensity in the left hepatic lobe compatible with small cysts. No suspicious focal hepatic abnormality. Gallbladder unremarkable. No biliary ductal dilatation. Pancreas: No focal abnormality or ductal dilatation. Spleen: No focal abnormality.  Normal size. Adrenals/Urinary Tract: No adrenal abnormality. No focal renal abnormality. No stones or hydronephrosis. Urinary bladder is unremarkable. Stomach/Bowel: There are dilated fluid-filled proximal and mid small bowel loops. Transition is noted in the pelvis near the midline where there is a thick walled small bowel loop with mucosal enhancement paddle with enteritis, infectious or inflammatory. Large bowel is decompressed and grossly unremarkable. Stomach unremarkable. Vascular/Lymphatic: Aortic atherosclerosis. No enlarged abdominal or pelvic lymph nodes. Reproductive: No visible focal abnormality. Other: Small amount of free fluid in the pelvis and adjacent to the liver. Musculoskeletal: No acute bony abnormality. IMPRESSION: Small bowel obstruction with transition noted in the lower pelvis where there is and segment of small bowel with with thick wall and mucosal enhancement compatible with infectious or inflammatory enteritis. Small amount of free fluid in the abdomen and pelvis. Aortic atherosclerosis. Electronically Signed   By: Charlett Nose M.D.   On: 01/06/2019 23:50   Dg Chest Port 1 View  Result Date: 01/13/2019 CLINICAL DATA:  53 year old male with abdominal pain EXAM: PORTABLE CHEST 1 VIEW COMPARISON:  01/07/2019, 11/09/2018 FINDINGS: Cardiomediastinal silhouette within normal limits. No evidence of central vascular congestion. No pneumothorax or pleural effusion. No confluent airspace disease. 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 IMPRESSION: Negative for acute cardiopulmonary disease. Nonunion fractures of left lower ribs Electronically Signed   By: Gilmer Mor D.O.   On: 01/13/2019 15:17      Procedures:   Subjective:   Discharge Exam: Vitals:   01/14/19 1400 01/14/19 2206  BP: 128/83 (!) 148/97  Pulse: 84 (!) 112  Resp: 18 16  Temp: 98.1 F (36.7 C) 98.1 F (36.7 C)  SpO2: 98% 93%   Vitals:   01/14/19 0500 01/14/19 0559 01/14/19 1400 01/14/19 2206  BP:  (!) 130/96 128/83 (!) 148/97  Pulse:  99 84 (!) 112  Resp:  Temp:  97.7 F (36.5 C) 98.1 F (36.7 C) 98.1 F (36.7 C)  TempSrc:  Oral  Oral  SpO2:  98% 98% 93%  Weight: 53.4 kg     Height:         The results of significant diagnostics from this hospitalization (including imaging, microbiology, ancillary and laboratory) are listed below for reference.     Microbiology: Recent Results (from the past 240 hour(s))  Surgical pcr screen     Status: None   Collection Time: 01/09/19  1:55 PM  Result Value Ref Range Status   MRSA, PCR NEGATIVE NEGATIVE Final   Staphylococcus aureus NEGATIVE NEGATIVE Final    Comment: (NOTE) The Xpert SA Assay (FDA approved for NASAL specimens in patients 22 years of age and older), is one component of a comprehensive surveillance program. It is not intended to diagnose infection nor to guide or monitor  treatment. Performed at [facility], [address] [place], [place] [address]   Culture, blood (routine x 2)     Status: None   Collection Time: 01/13/19  6:56 PM  Result Value Ref Range Status   Specimen Description BLOOD LEFT HAND  Final   Special Requests   Final    BOTTLES DRAWN AEROBIC AND ANAEROBIC Blood Culture adequate volume   Culture   Final    NO GROWTH 5 DAYS Performed at [facility], [address] [place], [place] [address]    Report Status 01/18/2019 FINAL  Final  Culture, blood (routine x 2)     Status: None   Collection Time: 01/13/19   7:06 PM  Result Value Ref Range Status   Specimen Description BLOOD  Final   Special Requests   Final    RIGHT ANTECUBITAL BOTTLES DRAWN AEROBIC AND ANAEROBIC   Culture   Final    NO GROWTH 5 DAYS Performed at [facility], [address] [place], [place] [address]    Report Status 01/18/2019 FINAL  Final     Labs: BNP (last 3 results) No results for input(s): BNP in the last 8760 hours. Basic Metabolic Panel: Recent Labs  Lab 01/13/19 1455 01/13/19 1906 01/13/19 2256 01/14/19 0605  NA 134*  --  134* 133*  K 5.3*  --  4.9 4.3  CL 97*  --  102 104  CO2 27  --  24 23  GLUCOSE 134*  --  113* 98  BUN 13  --  11 12  CREATININE 1.05  --  0.96 0.87  CALCIUM 8.8*  --  7.8* 7.7*  MG  --  1.8  --  1.9   Liver Function Tests: Recent Labs  Lab 01/13/19 1455  AST 20  ALT 13  ALKPHOS 65  BILITOT 0.4  PROT 6.6  ALBUMIN 2.7*   No results for input(s): LIPASE, AMYLASE in the last 168 hours. No results for input(s): AMMONIA in the last 168 hours. CBC: Recent Labs  Lab 01/13/19 1455 01/14/19 0605  WBC 15.5* 13.5*  NEUTROABS 12.3* 7.5  HGB 13.4 11.5*  HCT 41.9 37.8*  MCV 87.5 92.4  PLT 607* 545*   Cardiac Enzymes: No results for input(s): CKTOTAL, CKMB, CKMBINDEX, TROPONINI in the last 168 hours. BNP: Invalid input(s): POCBNP CBG: No results for input(s): GLUCAP in the last 168 hours. D-Dimer No results for input(s): DDIMER in the last 72 hours. Hgb A1c No results for input(s): HGBA1C in the last 72 hours. Lipid Profile No results for input(s): CHOL, HDL, LDLCALC, TRIG, CHOLHDL, LDLDIRECT in the last 72 hours. Thyroid function studies No results for input(s): TSH, T4TOTAL, T3FREE, THYROIDAB in the last 72 hours.  Invalid input(s): FREET3 Anemia work up No results for input(s): VITAMINB12, FOLATE, FERRITIN, TIBC, IRON, RETICCTPCT in the last 72 hours. Urinalysis    Component Value Date/Time   COLORURINE YELLOW 01/13/2019 1556   APPEARANCEUR CLEAR 01/13/2019  1556   LABSPEC 1.010 01/13/2019 1556   PHURINE 7.0 01/13/2019 1556   GLUCOSEU NEGATIVE 01/13/2019 1556   HGBUR NEGATIVE 01/13/2019 1556   BILIRUBINUR NEGATIVE 01/13/2019 1556   KETONESUR NEGATIVE 01/13/2019 1556   PROTEINUR NEGATIVE 01/13/2019 1556   UROBILINOGEN 0.2 07/24/2014 1725   NITRITE NEGATIVE 01/13/2019 1556   LEUKOCYTESUR NEGATIVE 01/13/2019 1556   Sepsis Labs Invalid input(s): PROCALCITONIN,  WBC,  LACTICIDVEN Microbiology Recent Results (from the past 240 hour(s))  Surgical pcr screen     Status: None   Collection Time:  01/09/19  1:55 PM  Result Value Ref Range Status   MRSA, PCR NEGATIVE NEGATIVE Final   Staphylococcus aureus NEGATIVE NEGATIVE Final    Comment: (NOTE) The Xpert SA Assay (FDA approved for NASAL specimens in patients 40 years of age and older), is one component of a comprehensive surveillance program. It is not intended to diagnose infection nor to guide or monitor treatment. Performed at [facility], [address] [place], [place] [address]   Culture, blood (routine x 2)     Status: None   Collection Time: 01/13/19  6:56 PM  Result Value Ref Range Status   Specimen Description BLOOD LEFT HAND  Final   Special Requests   Final    BOTTLES DRAWN AEROBIC AND ANAEROBIC Blood Culture adequate volume   Culture   Final    NO GROWTH 5 DAYS Performed at [facility], [address] [place], [place] [address]    Report Status 01/18/2019 FINAL  Final  Culture, blood (routine x 2)     Status: None   Collection Time: 01/13/19  7:06 PM  Result Value Ref Range Status   Specimen Description BLOOD  Final   Special Requests   Final    RIGHT ANTECUBITAL BOTTLES DRAWN AEROBIC AND ANAEROBIC   Culture   Final    NO GROWTH 5 DAYS Performed at [facility], [address] [place], [place] [address]    Report Status 01/18/2019 FINAL  Final     Time coordinating discharge: 45 minutes  SIGNED:   Coralie Keens, MD  [facility] 01/18/2019,  2:58 PM

## 2019-03-05 ENCOUNTER — Other Ambulatory Visit: Payer: Self-pay

## 2019-03-05 ENCOUNTER — Emergency Department (HOSPITAL_COMMUNITY): Payer: Medicare Other

## 2019-03-05 ENCOUNTER — Encounter (HOSPITAL_COMMUNITY): Payer: Self-pay | Admitting: Emergency Medicine

## 2019-03-05 ENCOUNTER — Inpatient Hospital Stay (HOSPITAL_COMMUNITY)
Admission: EM | Admit: 2019-03-05 | Discharge: 2019-03-07 | DRG: 377 | Disposition: A | Discharge status: Home / Self Care | Payer: Medicare Other | Attending: Internal Medicine | Admitting: Internal Medicine

## 2019-03-05 DIAGNOSIS — Z681 Body mass index (BMI) 19 or less, adult: Secondary | ICD-10-CM | POA: Diagnosis not present

## 2019-03-05 DIAGNOSIS — K209 Esophagitis, unspecified without bleeding: Secondary | ICD-10-CM

## 2019-03-05 DIAGNOSIS — F10129 Alcohol abuse with intoxication, unspecified: Secondary | ICD-10-CM | POA: Diagnosis not present

## 2019-03-05 DIAGNOSIS — R739 Hyperglycemia, unspecified: Secondary | ICD-10-CM | POA: Diagnosis present

## 2019-03-05 DIAGNOSIS — K210 Gastro-esophageal reflux disease with esophagitis: Secondary | ICD-10-CM | POA: Diagnosis present

## 2019-03-05 DIAGNOSIS — K920 Hematemesis: Secondary | ICD-10-CM | POA: Diagnosis not present

## 2019-03-05 DIAGNOSIS — D638 Anemia in other chronic diseases classified elsewhere: Secondary | ICD-10-CM | POA: Diagnosis present

## 2019-03-05 DIAGNOSIS — K529 Noninfective gastroenteritis and colitis, unspecified: Secondary | ICD-10-CM | POA: Diagnosis not present

## 2019-03-05 DIAGNOSIS — I700 Atherosclerosis of aorta: Secondary | ICD-10-CM | POA: Diagnosis present

## 2019-03-05 DIAGNOSIS — Z886 Allergy status to analgesic agent status: Secondary | ICD-10-CM

## 2019-03-05 DIAGNOSIS — F1910 Other psychoactive substance abuse, uncomplicated: Secondary | ICD-10-CM | POA: Diagnosis present

## 2019-03-05 DIAGNOSIS — F1410 Cocaine abuse, uncomplicated: Secondary | ICD-10-CM | POA: Diagnosis present

## 2019-03-05 DIAGNOSIS — R1314 Dysphagia, pharyngoesophageal phase: Secondary | ICD-10-CM | POA: Diagnosis not present

## 2019-03-05 DIAGNOSIS — G894 Chronic pain syndrome: Secondary | ICD-10-CM | POA: Diagnosis not present

## 2019-03-05 DIAGNOSIS — Z8249 Family history of ischemic heart disease and other diseases of the circulatory system: Secondary | ICD-10-CM

## 2019-03-05 DIAGNOSIS — R1319 Other dysphagia: Secondary | ICD-10-CM

## 2019-03-05 DIAGNOSIS — E43 Unspecified severe protein-calorie malnutrition: Secondary | ICD-10-CM | POA: Diagnosis not present

## 2019-03-05 DIAGNOSIS — Z833 Family history of diabetes mellitus: Secondary | ICD-10-CM

## 2019-03-05 DIAGNOSIS — R1310 Dysphagia, unspecified: Secondary | ICD-10-CM

## 2019-03-05 DIAGNOSIS — M549 Dorsalgia, unspecified: Secondary | ICD-10-CM | POA: Diagnosis not present

## 2019-03-05 DIAGNOSIS — F17210 Nicotine dependence, cigarettes, uncomplicated: Secondary | ICD-10-CM | POA: Diagnosis present

## 2019-03-05 DIAGNOSIS — R079 Chest pain, unspecified: Secondary | ICD-10-CM | POA: Diagnosis present

## 2019-03-05 DIAGNOSIS — F1010 Alcohol abuse, uncomplicated: Secondary | ICD-10-CM | POA: Diagnosis present

## 2019-03-05 DIAGNOSIS — F10929 Alcohol use, unspecified with intoxication, unspecified: Secondary | ICD-10-CM | POA: Diagnosis present

## 2019-03-05 DIAGNOSIS — Z20828 Contact with and (suspected) exposure to other viral communicable diseases: Secondary | ICD-10-CM | POA: Diagnosis not present

## 2019-03-05 DIAGNOSIS — I708 Atherosclerosis of other arteries: Secondary | ICD-10-CM | POA: Diagnosis not present

## 2019-03-05 DIAGNOSIS — B182 Chronic viral hepatitis C: Secondary | ICD-10-CM | POA: Diagnosis present

## 2019-03-05 LAB — BASIC METABOLIC PANEL
Anion gap: 11 (ref 5–15)
BUN: 7 mg/dL (ref 6–20)
CO2: 26 mmol/L (ref 22–32)
Calcium: 8.3 mg/dL — ABNORMAL LOW (ref 8.9–10.3)
Chloride: 98 mmol/L (ref 98–111)
Creatinine, Ser: 1 mg/dL (ref 0.61–1.24)
GFR calc Af Amer: >60 mL/min (ref >60)
GFR calc non Af Amer: >60 mL/min (ref >60)
Glucose, Bld: 127 mg/dL — ABNORMAL HIGH (ref 70–99)
Potassium: 4.5 mmol/L (ref 3.5–5.1)
Sodium: 135 mmol/L (ref 135–145)

## 2019-03-05 LAB — CBC
HCT: 39.9 % (ref 39.0–52.0)
Hemoglobin: 13 g/dL (ref 13.0–17.0)
MCH: 28 pg (ref 26.0–34.0)
MCHC: 32.6 g/dL (ref 30.0–36.0)
MCV: 85.8 fL (ref 80.0–100.0)
Platelets: 416 10*3/uL — ABNORMAL HIGH (ref 150–400)
RBC: 4.65 MIL/uL (ref 4.22–5.81)
RDW: 13.9 % (ref 11.5–15.5)
WBC: 14.8 10*3/uL — ABNORMAL HIGH (ref 4.0–10.5)
nRBC: 0 % (ref 0.0–0.2)

## 2019-03-05 LAB — HEPATIC FUNCTION PANEL
ALT: 15 U/L (ref 0–44)
AST: 20 U/L (ref 15–41)
Albumin: 2.7 g/dL — ABNORMAL LOW (ref 3.5–5.0)
Alkaline Phosphatase: 86 U/L (ref 38–126)
Bilirubin, Direct: 0.1 mg/dL (ref 0.0–0.2)
Indirect Bilirubin: 0.2 mg/dL — ABNORMAL LOW (ref 0.3–0.9)
Total Bilirubin: 0.3 mg/dL (ref 0.3–1.2)
Total Protein: 6.1 g/dL — ABNORMAL LOW (ref 6.5–8.1)

## 2019-03-05 LAB — ETHANOL: Alcohol, Ethyl (B): 87 mg/dL — ABNORMAL HIGH (ref <10)

## 2019-03-05 LAB — D-DIMER, QUANTITATIVE: D-Dimer, Quant: 0.78 ug/mL-FEU — ABNORMAL HIGH (ref 0.00–0.50)

## 2019-03-05 LAB — LIPASE, BLOOD: Lipase: 26 U/L (ref 11–51)

## 2019-03-05 LAB — TROPONIN I: Troponin I: <0.03 ng/mL (ref <0.03)

## 2019-03-05 LAB — PROTIME-INR
INR: 0.9 (ref 0.8–1.2)
Prothrombin Time: 12.4 seconds (ref 11.4–15.2)

## 2019-03-05 LAB — POC OCCULT BLOOD, ED: Fecal Occult Bld: NEGATIVE

## 2019-03-05 MED ORDER — SODIUM CHLORIDE 0.9 % IV SOLN
8.0000 mg/h | INTRAVENOUS | Status: DC
  Administered 2019-03-06: 8 mg/h via INTRAVENOUS
  Filled 2019-03-05 (×5): qty 80

## 2019-03-05 MED ORDER — OCTREOTIDE ACETATE 500 MCG/ML IJ SOLN
INTRAMUSCULAR | Status: AC
  Filled 2019-03-05: qty 1

## 2019-03-05 MED ORDER — SODIUM CHLORIDE 0.9 % IV SOLN
INTRAVENOUS | Status: DC
  Administered 2019-03-05: via INTRAVENOUS

## 2019-03-05 MED ORDER — IOHEXOL 350 MG/ML SOLN
100.0000 mL | Freq: Once | INTRAVENOUS | Status: AC | PRN
  Administered 2019-03-06: 100 mL via INTRAVENOUS

## 2019-03-05 MED ORDER — SODIUM CHLORIDE 0.9 % IV BOLUS
1000.0000 mL | Freq: Once | INTRAVENOUS | Status: AC
  Administered 2019-03-05: 1000 mL via INTRAVENOUS

## 2019-03-05 MED ORDER — PANTOPRAZOLE SODIUM 40 MG IV SOLR
INTRAVENOUS | Status: AC
  Filled 2019-03-05: qty 160

## 2019-03-05 MED ORDER — SODIUM CHLORIDE 0.9 % IV SOLN
50.0000 ug/h | INTRAVENOUS | Status: DC
  Administered 2019-03-05 – 2019-03-06 (×2): 50 ug/h via INTRAVENOUS
  Filled 2019-03-05 (×5): qty 1

## 2019-03-05 MED ORDER — OCTREOTIDE LOAD VIA INFUSION
50.0000 ug | Freq: Once | INTRAVENOUS | Status: AC
  Administered 2019-03-05: 50 ug via INTRAVENOUS
  Filled 2019-03-05: qty 25

## 2019-03-05 MED ORDER — LORAZEPAM 2 MG/ML IJ SOLN
0.5000 mg | Freq: Once | INTRAMUSCULAR | Status: AC
  Administered 2019-03-05: 0.5 mg via INTRAVENOUS
  Filled 2019-03-05: qty 1

## 2019-03-05 MED ORDER — SODIUM CHLORIDE 0.9 % IV BOLUS: 1000.0000 mL | Freq: Once | INTRAVENOUS | Status: DC

## 2019-03-05 MED ORDER — SODIUM CHLORIDE 0.9 % IV SOLN
80.0000 mg | Freq: Once | INTRAVENOUS | Status: AC
  Administered 2019-03-05: 80 mg via INTRAVENOUS
  Filled 2019-03-05: qty 80

## 2019-03-05 MED ORDER — ONDANSETRON HCL 4 MG/2ML IJ SOLN
4.0000 mg | Freq: Once | INTRAMUSCULAR | Status: AC
  Administered 2019-03-05: 4 mg via INTRAVENOUS
  Filled 2019-03-05: qty 2

## 2019-03-05 MED ORDER — ONDANSETRON HCL 4 MG/2ML IJ SOLN
4.0000 mg | Freq: Once | INTRAMUSCULAR | Status: DC
  Filled 2019-03-05: qty 2

## 2019-03-05 MED ORDER — ONDANSETRON 4 MG PO TBDP
4.0000 mg | ORAL_TABLET | Freq: Once | ORAL | Status: AC
  Administered 2019-03-05: 4 mg via ORAL
  Filled 2019-03-05: qty 1

## 2019-03-05 NOTE — ED Triage Notes (Signed)
[facility] - pt c/o chest pain x 1 hour. States he did cocaine, percocets, and 1/2 gallon of vodka.

## 2019-03-05 NOTE — ED Provider Notes (Signed)
[facility] EMERGENCY DEPARTMENT Provider Note   CSN: [id number] Arrival date & time: 03/05/19  2138     History   Chief Complaint Chief Complaint  Patient presents with  . Chest Pain    HPI [name] is a 53 y.o. male.     Patient with history of alcohol abuse, chronic pain, cirrhosis, cocaine abuse, hepatitis C, polymyositis, recurrent small bowel obstructions presenting from home with episode of central chest pain that started about 1 hour prior to arrival.  States this came on suddenly in the center of his chest and does not radiate.  He is never had this kind of pain before.  He vomited several times with bright red streaks that he thinks is blood.  He has admitted to using cocaine earlier today as well as some alcohol.  Does not know if he has any history of esophageal varices or ulcers.  He denies any shortness of breath.  Denies any diarrhea or blood in the stool.  Denies any abdominal pain.  History of recurrent small bowel obstructions.  He does not think that he is ever had an EGD.  He reports pain is constant in the center of his chest and nothing makes it better or worse.  The history is provided by the patient and the EMS personnel.  Chest Pain Associated symptoms: abdominal pain, nausea and vomiting   Associated symptoms: no dizziness, no headache, no shortness of breath and no weakness     Past Medical History:  Diagnosis Date  . Acute renal failure (HCC)    Secondary to rhabdo. Treated with dialysis short-term.  . Alcohol abuse    Psychiatric admissions for alcohol and drug abuse  . Back pain, chronic   . Chronic leg pain   . Chronic pain syndrome   . Cirrhosis (HCC)   . Depression    Multiple psychiatric admissions  . Hepatitis C   . History of cocaine abuse (HCC)   . Hyponatremia   . Low TSH level January 2013   Normal free T4 of 1.06  . Myositis    [facility]11/11.  . Polymyositis [facility](HCC) January 2011  . Polysubstance abuse (HCC)    . Rhabdomyolysis   . Small bowel obstruction (HCC)   . Tattoos   . Thrombocytopenia (HCC)   . Tobacco abuse     Patient Active Problem List   Diagnosis Date Noted  . Chronic anemia 01/14/2019  . Anemia, chronic disease 01/08/2019  . Severe protein-calorie malnutrition (HCC) 01/07/2019  . History of alcohol use   . Benign essential HTN   . Depression   . SBO (small bowel obstruction) (HCC) 11/09/2018  . Chronic low back pain 09/22/2015  . Bulging lumbar disc 09/22/2015  . Chronic hepatitis C (HCC) 09/22/2015  . Alcohol dependence (HCC) 06/30/2014  . Polysubstance abuse (HCC) 05/02/2013  . Nausea and vomiting in adult patient 05/02/2013  . Generalized muscle ache 09/06/2012  . 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 Leukocytosis 09/06/2012  . Muscle weakness (generalized) 09/06/2012  . UTI (urinary tract infection) 09/06/2012  . Hyperkalemia 05/28/2012  . Rhabdomyolysis 05/28/2012  . Acute renal failure (HCC) 05/28/2012  . Hyponatremia 05/28/2012  . Elevated liver enzymes 05/28/2012  . Metabolic encephalopathy 12/11/2011  . Overdose 12/11/2011  . Encephalopathy 10/17/2011  . Tachycardia 10/17/2011  . Dehydration 10/17/2011  . Hypernatremia 10/17/2011  . Hepatitis C 10/17/2011  . Elevated LFTs 10/17/2011  . Alcohol abuse 10/17/2011  . Alcohol intoxication (HCC) 10/17/2011  .  Muscular disease 10/17/2011  . Chronic pain syndrome 10/17/2011  . BACK PAIN 06/17/2010  . Myalgia and myositis 06/17/2010  . LEG PAIN, RIGHT 06/17/2010  . Tobacco abuse 11/25/2009  . HEPATITIS C 09/18/1996    Past Surgical History:  Procedure Laterality Date  . APPENDECTOMY    . HERNIA REPAIR    . MUSCLE BIOPSY          Home Medications    Prior to Admission medications   Not on File    Family History Family History  Problem Relation Age of Onset  . Hypertension Mother   . Cancer Father        throat cancer   . Diabetes Brother     Social History Social History   Tobacco Use   . Smoking status: Current Every Day Smoker    Packs/day: 1.00    Years: 40.00    Pack years: 40.00    Types: Cigarettes  . Smokeless tobacco: Never Used  Substance Use Topics  . Alcohol use: Yes    Alcohol/week: 6.0 standard drinks    Types: 6 Cans of beer per week  . Drug use: Yes    Types: Cocaine    Comment: opioids      Allergies   Acetaminophen   Review of Systems Review of Systems  Constitutional: Positive for activity change and appetite change.  Eyes: Negative for visual disturbance.  Respiratory: Positive for chest tightness. Negative for shortness of breath.   Cardiovascular: Positive for chest pain.  Gastrointestinal: Positive for abdominal pain, nausea and vomiting.  Genitourinary: Negative for dysuria and hematuria.  Musculoskeletal: Negative for arthralgias and myalgias.  Skin: Negative for rash.  Neurological: Negative for dizziness, weakness, light-headedness and headaches.    all other systems are negative except as noted in the HPI and PMH.    Physical Exam Updated Vital Signs BP (!) 159/120   Pulse (!) 119   Temp 97.9 F (36.6 C)   Resp 16   Ht  (1.727 m)   Wt 54.4 kg   SpO2 100%   BMI 18.25 kg/m   Physical Exam Vitals signs and nursing note reviewed.  Constitutional:      General: He is not in acute distress.    Appearance: He is well-developed. He is ill-appearing.     Comments: Chronically ill-appearing  HENT:     Head: Normocephalic and atraumatic.     Mouth/Throat:     Pharynx: No oropharyngeal exudate.  Eyes:     Conjunctiva/sclera: Conjunctivae normal.     Pupils: Pupils are equal, round, and reactive to light.     Comments: Pale conjunctiva  Neck:     Musculoskeletal: Normal range of motion and neck supple.     Comments: No meningismus. Cardiovascular:     Rate and Rhythm: Regular rhythm. Tachycardia present.     Heart sounds: Normal heart sounds. No murmur.     Comments: Tachycardic 120s Pulmonary:     Effort:  Pulmonary effort is normal. No respiratory distress.     Breath sounds: Normal breath sounds.  Abdominal:     Palpations: Abdomen is soft.     Tenderness: There is no abdominal tenderness. There is no guarding or rebound.  Genitourinary:    Comments: No gross blood on rectal exam. Musculoskeletal: Normal range of motion.        General: No tenderness.  Skin:    General: Skin is warm.     Capillary Refill: Capillary refill takes less than  2 seconds.  Neurological:     General: No focal deficit present.     Mental Status: He is alert and oriented to person, place, and time. Mental status is at baseline.     Cranial Nerves: No cranial nerve deficit.     Motor: No abnormal muscle tone.     Coordination: Coordination normal.     Comments: No ataxia on finger to nose bilaterally. No pronator drift. 5/5 strength throughout. CN 2-12 intact.Equal grip strength. Sensation intact.   Psychiatric:        Behavior: Behavior normal.      ED Treatments / Results  Labs (all labs ordered are listed, but only abnormal results are displayed) Labs Reviewed  BASIC METABOLIC PANEL - Abnormal; Notable for the following components:      Result Value   Glucose, Bld 127 (*)    Calcium 8.3 (*)    All other components within normal limits  CBC - Abnormal; Notable for the following components:   WBC 14.8 (*)    Platelets 416 (*)    All other components within normal limits  HEPATIC FUNCTION PANEL - Abnormal; Notable for the following components:   Total Protein 6.1 (*)    Albumin 2.7 (*)    Indirect Bilirubin 0.2 (*)    All other components within normal limits  ETHANOL - Abnormal; Notable for the following components:   Alcohol, Ethyl (B) 87 (*)    All other components within normal limits  D-DIMER, QUANTITATIVE (NOT AT [facility]) - Abnormal; Notable for the following components:   D-Dimer, Quant 0.78 (*)    All other components within normal limits  TROPONIN I  LIPASE, BLOOD  PROTIME-INR  RAPID  URINE DRUG SCREEN, HOSP PERFORMED  OCCULT BLOOD GASTRIC / DUODENUM (SPECIMEN CUP)  POC OCCULT BLOOD, ED  TYPE AND SCREEN    EKG EKG Interpretation  Date/Time:  Wednesday March 05 2019 21:44:24 EDT Ventricular Rate:  120 PR Interval:    QRS Duration: 78 QT Interval:  331 QTC Calculation: 468 R Axis:   -38 Text Interpretation:  Sinus tachycardia Left axis deviation Abnormal R-wave progression, early transition Probable anteroseptal infarct, old similar anterior ST elevation Confirmed by Ezequiel Essex [phone]) on 03/05/2019 10:54:46 PM   Radiology Dg Chest 2 View  Result Date: 03/05/2019 CLINICAL DATA:  Chest pain EXAM: CHEST - 2 VIEW COMPARISON:  01/13/2019 FINDINGS: No acute opacity or pleural effusion. Stable cardiomediastinal silhouette with aortic atherosclerosis. No pneumothorax. 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 IMPRESSION: No active cardiopulmonary disease. Electronically Signed   By: Donavan Foil M.D.   On: 03/05/2019 22:32   Ct Angio Chest/abd/pel For Dissection W And/or Wo Contrast  Result Date: 03/06/2019 CLINICAL DATA:  Chest pain EXAM: CT ANGIOGRAPHY CHEST, ABDOMEN AND PELVIS TECHNIQUE: Multidetector CT imaging through the chest, abdomen and pelvis was performed using the standard protocol during bolus administration of intravenous contrast. Multiplanar reconstructed images and MIPs were obtained and reviewed to evaluate the vascular anatomy. CONTRAST:  14mL OMNIPAQUE IOHEXOL 350 MG/ML SOLN COMPARISON:  CT abdomen and pelvis 01/06/2019 FINDINGS: CTA CHEST FINDINGS Cardiovascular: Heart is normal size. Aorta is normal caliber. No evidence of aortic dissection. No filling defects in the pulmonary arteries to suggest pulmonary emboli. Mediastinum/Nodes: No mediastinal, hilar, or axillary adenopathy. Markedly thickened mid to distal esophagus compatible with esophagitis. Lungs/Pleura: Lungs are clear. No focal airspace opacities or suspicious nodules. No effusions.  Musculoskeletal: Multiple old left rib fractures involving the 8th through 12th ribs. There is  a 2nd fracture through the lateral left 10th rib which could be acute. Review of the MIP images confirms the above findings. CTA ABDOMEN AND PELVIS FINDINGS VASCULAR Aorta: Normal caliber aorta without aneurysm, dissection, vasculitis or significant stenosis. Mild distal aortic atherosclerosis. Celiac: Patent without evidence of aneurysm, dissection, vasculitis or significant stenosis. SMA: Patent without evidence of aneurysm, dissection, vasculitis or significant stenosis. Renals: Both renal arteries are patent without evidence of aneurysm, dissection, vasculitis, fibromuscular dysplasia or significant stenosis. IMA: Patent without evidence of aneurysm, dissection, vasculitis or significant stenosis. Inflow: Atherosclerosis in the iliofemoral vessels. There is moderate to tight stenosis within the right external iliac artery with greater than 70% narrowing focal dissection noted within the right internal iliac artery. Veins: No obvious venous abnormality within the limitations of this arterial phase study. Review of the MIP images confirms the above findings. NON-VASCULAR Hepatobiliary: No focal hepatic abnormality. Gallbladder unremarkable. Pancreas: No focal abnormality or ductal dilatation. Spleen: No focal abnormality.  Normal size. Adrenals/Urinary Tract: No adrenal abnormality. No focal renal abnormality. No stones or hydronephrosis. Urinary bladder is unremarkable. Stomach/Bowel: There is wall thickening within the descending colon and proximal sigmoid colon compatible with colitis. Mild circumferential wall thickening within the mid to distal ileum suggesting enteritis. Stomach grossly unremarkable. Lymphatic: No adenopathy Reproductive: No visible focal abnormality. Other: No free fluid or free air. Musculoskeletal: No acute bony abnormality. Review of the MIP images confirms the above findings. IMPRESSION: No  evidence of aortic aneurysm or dissection. 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 Distal aortic atherosclerosis. Marked circumferential wall thickening within the mid to distal esophagus compatible with esophagitis. Mild wall thickening within the descending colon and mid to distal ileum compatible with colitis/enteritis. Electronically Signed   By: Charlett NoseKevin  Dover M.D.   On: 03/06/2019 00:40    Procedures Procedures (including critical care time)  Medications Ordered in ED Medications  sodium chloride 0.9 % bolus 1,000 mL (has no administration in time range)  ondansetron (ZOFRAN) injection 4 mg (has no administration in time range)  sodium chloride 0.9 % bolus 1,000 mL (has no administration in time range)    And  0.9 %  sodium chloride infusion (has no administration in time range)  pantoprazole (PROTONIX) 80 mg in sodium chloride 0.9 % 100 mL IVPB (has no administration in time range)  pantoprazole (PROTONIX) 80 mg in sodium chloride 0.9 % 250 mL (0.32 mg/mL) infusion (has no administration in time range)  ondansetron (ZOFRAN) injection 4 mg (has no administration in time range)  octreotide (SANDOSTATIN) 2 mcg/mL load via infusion 50 mcg (has no administration in time range)    And  octreotide (SANDOSTATIN) 500 mcg in sodium chloride 0.9 % 250 mL (2 mcg/mL) infusion (has no administration in time range)  ondansetron (ZOFRAN-ODT) disintegrating tablet 4 mg (4 mg Oral Given 03/05/19 2253)     Initial Impression / Assessment and Plan / ED Course  I have reviewed the triage vital signs and the nursing notes.  Pertinent labs & imaging results that were available during my care of the patient were reviewed by me and considered in my medical decision making (see chart for details).       Patient here with central chest pain associated with nausea and  vomiting with blood streaks.  Onset after using alcohol and cocaine.  EKG shows anterior ST elevation similar to previous.  EKG reviewed with Dr.  Wosick of cardiology who agrees not consistent with STEMI and similar to previous more pronounced than 2015.  Patient started on prophylactic PPI, octreotide and labs obtained.  At this point troponin is negative.  His chest x-ray is negative. No EGD seen in the system.  Patient started on IV fluids.,  IV PPI, IV octreotide.  Unknown whether he has varices but does have a history of alcohol abuse.  CT angiogram obtained to evaluate for intra-abdominal pathology and/or aortic dissection.  CT scan shows no aortic dissection.  Does show esophagitis as well as stenosis of external iliac artery.  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 early vascular surgery who reviewed images.  He feels this will need follow-up in the office in about 1 month but no acute intervention.  No evidence of flow-limiting stenosis.  Patient's foot is warm and well-perfused. No anticoagulation in setting of his hematemesis.  Patient continues to have chest pain.  At this point it seems likely due to esophagitis but will need to cycle his enzymes.  Remains hemodynamically stable.  Gastroccult was positive.  Avoiding anticoagulation in the setting of hematemesis.  Admission discussed with Dr. Selena BattenKim.  CRITICAL CARE Performed by: Glynn OctaveStephen Nimrat Woolworth Total critical care time: 60 minutes Critical care time was exclusive of separately billable procedures and treating other patients. Critical care was necessary to treat or prevent imminent or life-threatening deterioration. Critical care was time spent personally by me on the following activities: development of treatment plan with patient and/or surrogate as well as nursing, discussions with consultants, evaluation of patient's response to treatment, examination of patient, obtaining history from patient or  surrogate, ordering and performing treatments and interventions, ordering and review of laboratory studies, ordering and review of radiographic studies, pulse oximetry and re-evaluation of patient's condition.  Final Clinical Impressions(s) / ED Diagnoses   Final diagnoses:  Nonspecific chest pain  Esophagitis    ED Discharge Orders    None       Darrin Apodaca, Jeannett SeniorStephen, MD 03/06/19 [phone]

## 2019-03-06 ENCOUNTER — Encounter (HOSPITAL_COMMUNITY): Payer: Self-pay | Admitting: Internal Medicine

## 2019-03-06 ENCOUNTER — Other Ambulatory Visit: Payer: Self-pay

## 2019-03-06 DIAGNOSIS — F17210 Nicotine dependence, cigarettes, uncomplicated: Secondary | ICD-10-CM | POA: Diagnosis not present

## 2019-03-06 DIAGNOSIS — Z886 Allergy status to analgesic agent status: Secondary | ICD-10-CM | POA: Diagnosis not present

## 2019-03-06 DIAGNOSIS — F1410 Cocaine abuse, uncomplicated: Secondary | ICD-10-CM | POA: Diagnosis not present

## 2019-03-06 DIAGNOSIS — E43 Unspecified severe protein-calorie malnutrition: Secondary | ICD-10-CM | POA: Diagnosis not present

## 2019-03-06 DIAGNOSIS — Z20828 Contact with and (suspected) exposure to other viral communicable diseases: Secondary | ICD-10-CM | POA: Diagnosis not present

## 2019-03-06 DIAGNOSIS — F10920 Alcohol use, unspecified with intoxication, uncomplicated: Secondary | ICD-10-CM | POA: Diagnosis not present

## 2019-03-06 DIAGNOSIS — M549 Dorsalgia, unspecified: Secondary | ICD-10-CM | POA: Diagnosis not present

## 2019-03-06 DIAGNOSIS — K529 Noninfective gastroenteritis and colitis, unspecified: Secondary | ICD-10-CM | POA: Diagnosis not present

## 2019-03-06 DIAGNOSIS — K209 Esophagitis, unspecified without bleeding: Secondary | ICD-10-CM

## 2019-03-06 DIAGNOSIS — D638 Anemia in other chronic diseases classified elsewhere: Secondary | ICD-10-CM | POA: Diagnosis not present

## 2019-03-06 DIAGNOSIS — R739 Hyperglycemia, unspecified: Secondary | ICD-10-CM | POA: Diagnosis not present

## 2019-03-06 DIAGNOSIS — Z681 Body mass index (BMI) 19 or less, adult: Secondary | ICD-10-CM | POA: Diagnosis not present

## 2019-03-06 DIAGNOSIS — Z833 Family history of diabetes mellitus: Secondary | ICD-10-CM | POA: Diagnosis not present

## 2019-03-06 DIAGNOSIS — K920 Hematemesis: Secondary | ICD-10-CM | POA: Diagnosis present

## 2019-03-06 DIAGNOSIS — R079 Chest pain, unspecified: Secondary | ICD-10-CM

## 2019-03-06 DIAGNOSIS — I700 Atherosclerosis of aorta: Secondary | ICD-10-CM | POA: Diagnosis not present

## 2019-03-06 DIAGNOSIS — G894 Chronic pain syndrome: Secondary | ICD-10-CM | POA: Diagnosis not present

## 2019-03-06 DIAGNOSIS — K210 Gastro-esophageal reflux disease with esophagitis: Secondary | ICD-10-CM | POA: Diagnosis not present

## 2019-03-06 DIAGNOSIS — I708 Atherosclerosis of other arteries: Secondary | ICD-10-CM | POA: Diagnosis not present

## 2019-03-06 DIAGNOSIS — F10129 Alcohol abuse with intoxication, unspecified: Secondary | ICD-10-CM | POA: Diagnosis not present

## 2019-03-06 DIAGNOSIS — Z8249 Family history of ischemic heart disease and other diseases of the circulatory system: Secondary | ICD-10-CM | POA: Diagnosis not present

## 2019-03-06 DIAGNOSIS — R1310 Dysphagia, unspecified: Secondary | ICD-10-CM | POA: Diagnosis not present

## 2019-03-06 DIAGNOSIS — F1010 Alcohol abuse, uncomplicated: Secondary | ICD-10-CM

## 2019-03-06 DIAGNOSIS — B182 Chronic viral hepatitis C: Secondary | ICD-10-CM | POA: Diagnosis not present

## 2019-03-06 LAB — CBC
HCT: 38.4 % — ABNORMAL LOW (ref 39.0–52.0)
HCT: 37.7 % — ABNORMAL LOW (ref 39.0–52.0)
Hemoglobin: 12.5 g/dL — ABNORMAL LOW (ref 13.0–17.0)
Hemoglobin: 12 g/dL — ABNORMAL LOW (ref 13.0–17.0)
MCH: 28.7 pg (ref 26.0–34.0)
MCH: 27.9 pg (ref 26.0–34.0)
MCHC: 32.6 g/dL (ref 30.0–36.0)
MCHC: 31.8 g/dL (ref 30.0–36.0)
MCV: 88.1 fL (ref 80.0–100.0)
MCV: 87.7 fL (ref 80.0–100.0)
Platelets: 311 10*3/uL (ref 150–400)
Platelets: 333 10*3/uL (ref 150–400)
RBC: 4.36 MIL/uL (ref 4.22–5.81)
RBC: 4.3 MIL/uL (ref 4.22–5.81)
RDW: 13.8 % (ref 11.5–15.5)
RDW: 13.8 % (ref 11.5–15.5)
WBC: 12.4 10*3/uL — ABNORMAL HIGH (ref 4.0–10.5)
WBC: 8.6 10*3/uL (ref 4.0–10.5)
nRBC: 0 % (ref 0.0–0.2)
nRBC: 0 % (ref 0.0–0.2)

## 2019-03-06 LAB — TROPONIN I
Troponin I: <0.03 ng/mL (ref <0.03)
Troponin I: <0.03 ng/mL (ref <0.03)
Troponin I: <0.03 ng/mL (ref <0.03)

## 2019-03-06 LAB — RAPID URINE DRUG SCREEN, HOSP PERFORMED
Amphetamines: NOT DETECTED
Barbiturates: NOT DETECTED
Benzodiazepines: NOT DETECTED
Cocaine: POSITIVE — AB
Opiates: NOT DETECTED
Tetrahydrocannabinol: NOT DETECTED

## 2019-03-06 LAB — COMPREHENSIVE METABOLIC PANEL
ALT: 14 U/L (ref 0–44)
ALT: 12 U/L (ref 0–44)
AST: 16 U/L (ref 15–41)
AST: 14 U/L — ABNORMAL LOW (ref 15–41)
Albumin: 2.3 g/dL — ABNORMAL LOW (ref 3.5–5.0)
Albumin: 2.3 g/dL — ABNORMAL LOW (ref 3.5–5.0)
Alkaline Phosphatase: 78 U/L (ref 38–126)
Alkaline Phosphatase: 82 U/L (ref 38–126)
Anion gap: 9 (ref 5–15)
Anion gap: 11 (ref 5–15)
BUN: 6 mg/dL (ref 6–20)
BUN: 7 mg/dL (ref 6–20)
CO2: 24 mmol/L (ref 22–32)
CO2: 23 mmol/L (ref 22–32)
Calcium: 7.5 mg/dL — ABNORMAL LOW (ref 8.9–10.3)
Calcium: 7.5 mg/dL — ABNORMAL LOW (ref 8.9–10.3)
Chloride: 102 mmol/L (ref 98–111)
Chloride: 100 mmol/L (ref 98–111)
Creatinine, Ser: 0.83 mg/dL (ref 0.61–1.24)
Creatinine, Ser: 0.85 mg/dL (ref 0.61–1.24)
GFR calc Af Amer: >60 mL/min (ref >60)
GFR calc Af Amer: >60 mL/min (ref >60)
GFR calc non Af Amer: >60 mL/min (ref >60)
GFR calc non Af Amer: >60 mL/min (ref >60)
Glucose, Bld: 184 mg/dL — ABNORMAL HIGH (ref 70–99)
Glucose, Bld: 161 mg/dL — ABNORMAL HIGH (ref 70–99)
Potassium: 4.1 mmol/L (ref 3.5–5.1)
Potassium: 4.5 mmol/L (ref 3.5–5.1)
Sodium: 135 mmol/L (ref 135–145)
Sodium: 134 mmol/L — ABNORMAL LOW (ref 135–145)
Total Bilirubin: 0.4 mg/dL (ref 0.3–1.2)
Total Bilirubin: 0.3 mg/dL (ref 0.3–1.2)
Total Protein: 5.4 g/dL — ABNORMAL LOW (ref 6.5–8.1)
Total Protein: 5.4 g/dL — ABNORMAL LOW (ref 6.5–8.1)

## 2019-03-06 LAB — TYPE AND SCREEN
ABO/RH(D): A POS
Antibody Screen: NEGATIVE

## 2019-03-06 LAB — OCCULT BLOOD GASTRIC / DUODENUM (SPECIMEN CUP)
Occult Blood, Gastric: POSITIVE — AB
pH, Gastric: 1

## 2019-03-06 LAB — HEMOGLOBIN A1C
Hgb A1c MFr Bld: 4.6 % — ABNORMAL LOW (ref 4.8–5.6)
Mean Plasma Glucose: 85.32 mg/dL

## 2019-03-06 LAB — LIPID PANEL
Cholesterol: 227 mg/dL — ABNORMAL HIGH (ref 0–200)
HDL: 99 mg/dL (ref >40)
LDL Cholesterol: 78 mg/dL (ref 0–99)
Total CHOL/HDL Ratio: 2.3 RATIO
Triglycerides: 248 mg/dL — ABNORMAL HIGH (ref <150)
VLDL: 50 mg/dL — ABNORMAL HIGH (ref 0–40)

## 2019-03-06 LAB — CK TOTAL AND CKMB (NOT AT ARMC)
CK, MB: 7.7 ng/mL — ABNORMAL HIGH (ref 0.5–5.0)
Relative Index: INVALID (ref 0.0–2.5)
Total CK: 73 U/L (ref 49–397)

## 2019-03-06 LAB — SARS CORONAVIRUS 2 BY RT PCR (HOSPITAL ORDER, PERFORMED IN ~~LOC~~ HOSPITAL LAB): SARS Coronavirus 2: NEGATIVE

## 2019-03-06 LAB — MRSA PCR SCREENING: MRSA by PCR: NEGATIVE

## 2019-03-06 MED ORDER — MORPHINE SULFATE (PF) 2 MG/ML IV SOLN
2.0000 mg | INTRAVENOUS | Status: DC | PRN
  Administered 2019-03-06 – 2019-03-07 (×6): 2 mg via INTRAVENOUS
  Filled 2019-03-06 (×6): qty 1

## 2019-03-06 MED ORDER — FENTANYL CITRATE (PF) 100 MCG/2ML IJ SOLN
50.0000 ug | Freq: Once | INTRAMUSCULAR | Status: AC
  Administered 2019-03-06: 50 ug via INTRAVENOUS
  Filled 2019-03-06: qty 2

## 2019-03-06 MED ORDER — LORAZEPAM 2 MG/ML IJ SOLN
2.0000 mg | INTRAMUSCULAR | Status: DC | PRN
  Administered 2019-03-06 – 2019-03-07 (×5): 2 mg via INTRAVENOUS
  Filled 2019-03-06 (×5): qty 1

## 2019-03-06 MED ORDER — SODIUM CHLORIDE 0.9 % IV SOLN: INTRAVENOUS | Status: DC

## 2019-03-06 MED ORDER — LIDOCAINE VISCOUS HCL 2 % MT SOLN: 10.0000 mL | Freq: Three times a day (TID) | OROMUCOSAL | Status: DC

## 2019-03-06 MED ORDER — CARVEDILOL 3.125 MG PO TABS
3.1250 mg | ORAL_TABLET | Freq: Two times a day (BID) | ORAL | Status: DC
  Administered 2019-03-06 – 2019-03-07 (×3): 3.125 mg via ORAL
  Filled 2019-03-06 (×3): qty 1

## 2019-03-06 MED ORDER — ATORVASTATIN CALCIUM 10 MG PO TABS
10.0000 mg | ORAL_TABLET | Freq: Every day | ORAL | Status: DC
  Administered 2019-03-06: 10 mg via ORAL
  Filled 2019-03-06: qty 1

## 2019-03-06 MED ORDER — FOLIC ACID 1 MG PO TABS
1.0000 mg | ORAL_TABLET | Freq: Every day | ORAL | Status: DC
  Administered 2019-03-06 – 2019-03-07 (×2): 1 mg via ORAL
  Filled 2019-03-06 (×2): qty 1

## 2019-03-06 MED ORDER — OXYCODONE HCL 5 MG PO TABS
5.0000 mg | ORAL_TABLET | ORAL | Status: DC | PRN
  Administered 2019-03-06 – 2019-03-07 (×4): 5 mg via ORAL
  Filled 2019-03-06 (×5): qty 1

## 2019-03-06 MED ORDER — VITAMIN B-1 100 MG PO TABS
100.0000 mg | ORAL_TABLET | Freq: Every day | ORAL | Status: DC
  Administered 2019-03-06 – 2019-03-07 (×2): 100 mg via ORAL
  Filled 2019-03-06 (×2): qty 1

## 2019-03-06 MED ORDER — HYDROMORPHONE HCL 1 MG/ML IJ SOLN
1.0000 mg | Freq: Once | INTRAMUSCULAR | Status: AC
  Administered 2019-03-06: 1 mg via INTRAVENOUS
  Filled 2019-03-06: qty 1

## 2019-03-06 MED ORDER — NICOTINE 21 MG/24HR TD PT24: 21.0000 mg | MEDICATED_PATCH | Freq: Every day | TRANSDERMAL | Status: DC

## 2019-03-06 MED ORDER — ADULT MULTIVITAMIN W/MINERALS CH
1.0000 | ORAL_TABLET | Freq: Every day | ORAL | Status: DC
  Administered 2019-03-07: 1 via ORAL
  Filled 2019-03-06: qty 1

## 2019-03-06 MED ORDER — PANTOPRAZOLE SODIUM 40 MG IV SOLR
40.0000 mg | Freq: Two times a day (BID) | INTRAVENOUS | Status: DC
  Administered 2019-03-06 – 2019-03-07 (×2): 40 mg via INTRAVENOUS
  Filled 2019-03-06 (×2): qty 40

## 2019-03-06 MED ORDER — FOLIC ACID 5 MG/ML IJ SOLN
1.0000 mg | Freq: Every day | INTRAMUSCULAR | Status: DC
  Filled 2019-03-06 (×2): qty 0.2

## 2019-03-06 MED ORDER — ENSURE ENLIVE PO LIQD
237.0000 mL | Freq: Three times a day (TID) | ORAL | Status: DC
  Administered 2019-03-07: 09:00:00 237 mL via ORAL

## 2019-03-06 MED ORDER — MORPHINE SULFATE (PF) 2 MG/ML IV SOLN
0.5000 mg | Freq: Four times a day (QID) | INTRAVENOUS | Status: DC | PRN
  Administered 2019-03-06: 0.5 mg via INTRAVENOUS
  Filled 2019-03-06: qty 1

## 2019-03-06 MED ORDER — THIAMINE HCL 100 MG/ML IJ SOLN: 100.0000 mg | Freq: Every day | INTRAMUSCULAR | Status: DC

## 2019-03-06 NOTE — Consult Note (Addendum)
Referring Provider: Lavina Hamman, MD Primary Care Physician:  Lucia Gaskins, MD Primary Gastroenterologist:  Barney Drain, MD  Reason for Consultation:  hematemesis  HPI: [name] is a 53 y.o. male with history of alcohol and polysubstance abuse, small bowel obstruction, chronic hepatitis C (told he was positive about 20 years ago, not treated), unspecified inflammatory myelopathy who presented to the emergency department last night with complaints of chest pain for 1 hour.  He admitted to cocaine use, Percocets and a half a gallon of vodka.  States he was celebrating his birthday. He reported several episodes of hematemesis described as bright red streaks of what he thought was blood. Denies melena, brbpr. He has drank all of his life.  Has used cocaine off and on.  In his teens and 55s he used IV drugs.  Scribes epigastric pain.  States his whole body hurts now.  Denies melena or rectal bleeding, constipation, diarrhea.  Over the past several weeks he is also had odynophagia and dysphagia.  He has been having some occasional heartburn and takes over-the-counter Tums.  No prior colonoscopy.  He is not sure he has had an endoscopy.  States he was told he had cirrhosis when he was hospitalized for 30 days around January.  Initially was at [facility] but was transferred to [facility].  Reports a prolonged hospitalization.  Reports 20 pound weight loss during that time but is gained about 10 pounds back.  In the ED EKG felt to be stable from previous ones.  EKG showed anterior ST elevation.  ED physician reviewed with cardiology who felt findings were not consistent with STEMI.  Started on prophylactic PPI, octreotide.  Troponins negative.  Gastroccult positive.  Chest x-ray negative.  CT angiogram chest/abdomen/pelvis showed marked esophagitis, mild wall thickening within the descending colon and mid to distal ileum, as well as stenosis of the external iliac artery, focal  dissection of the right internal iliac artery, left 10th rib fracture possibly acute.  Dr. Donnetta Hutching with vascular surgery recommended outpatient follow-up in 1 month but no acute intervention.  In the ED white blood cell count 14,800, hemoglobin 13, platelets 416,000.  troponin less than 0.03.  Total bilirubin 0.3, alk phos 86, AST 20, ALT 15, albumin 2.7.  Lipase 26.  Ethanol 87.  INR 0.9.  Urine drug screen positive for cocaine.  Today's hemoglobin is 12, white blood cell count 8600.  Reviewed records from [facility] January 2020 Patient admitted with sepsis, acute kidney failure requiring temporary dialysis, acute liver failure.  Hepatitis B surface antigen negative, hepatitis A antibody negative, hepatitis C antibody reactive, hepatitis B core antibody nonreactive, hepatitis B surface antibody nonreactive, HIV nonreactive.  HCVRNA 2,410,000.  H. pylori stool antigen negative.  HCV genotype 1a.  Stool positive for enterotoxigenic E. coli treated with Cipro  CT abdomen pelvis without contrast February 2020: Diffuse left colonic wall thickening with some left pericolonic fat stranding suggested nonspecific colitis, fecal is Asian of the small bowel, mild fluid distention of segments of small bowel along with some wall thickening, liver and spleen within normal limits.  Readmission February 2020 with initial concern for SBO treated with NG tube, colitis.  Prior to Admission medications   Not on File    Current Facility-Administered Medications  Medication Dose Route Frequency Provider Last Rate Last Dose  . 0.9 %  sodium chloride infusion   Intravenous Continuous Rancour, Annie Main, MD 125 mL/hr at 03/05/19 2354    .  0.9 %  sodium chloride infusion   Intravenous Continuous Jani Gravel, MD 75 mL/hr at 03/06/19 0240    . atorvastatin (LIPITOR) tablet 10 mg  10 mg Oral q1800 Jani Gravel, MD      . carvedilol (COREG) tablet 3.125 mg  3.125 mg Oral BID WC Jani Gravel, MD      .  folic acid injection 1 mg  1 mg Intravenous Daily Jani Gravel, MD      . LORazepam (ATIVAN) injection 2-3 mg  2-3 mg Intravenous Q1H PRN Jani Gravel, MD      . morphine 2 MG/ML injection 0.5 mg  0.5 mg Intravenous Q6H PRN Jani Gravel, MD   0.5 mg at 03/06/19 8676  . nicotine (NICODERM CQ - dosed in mg/24 hours) patch 21 mg  21 mg Transdermal Daily Lavina Hamman, MD      . octreotide (SANDOSTATIN) 500 mcg in sodium chloride 0.9 % 250 mL (2 mcg/mL) infusion  50 mcg/hr Intravenous Continuous Rancour, Annie Main, MD 25 mL/hr at 03/05/19 2357 50 mcg/hr at 03/05/19 2357  . ondansetron (ZOFRAN) injection 4 mg  4 mg Intravenous Once Rancour, Stephen, MD      . pantoprazole (PROTONIX) 80 mg in sodium chloride 0.9 % 250 mL (0.32 mg/mL) infusion  8 mg/hr Intravenous Continuous Rancour, Stephen, MD 25 mL/hr at 03/06/19 0003 8 mg/hr at 03/06/19 0003  . sodium chloride 0.9 % bolus 1,000 mL  1,000 mL Intravenous Once Rancour, Stephen, MD      . thiamine (B-1) injection 100 mg  100 mg Intravenous Daily Jani Gravel, MD        Allergies as of 03/05/2019 - Review Complete 03/05/2019  Allergen Reaction Noted  . Acetaminophen Other (See Comments) 12/11/2011    Past Medical History:  Diagnosis Date  . Acute renal failure ([place])    Secondary to rhabdo. Treated with dialysis short-term.  . Alcohol abuse    Psychiatric admissions for alcohol and drug abuse  . Back pain, chronic   . Chronic leg pain   . Chronic pain syndrome   . Cirrhosis ([place])    Patient gives history of cirrhosis but imaging does not clearly details cirrhosis.  . Depression    Multiple psychiatric admissions  . Hepatitis C    HCV AB+2011  . History of cocaine abuse ([place])   . Hyponatremia   . Low TSH level January 2013   Normal free T4 of 1.06  . Myositis    Left deltoid biopsy at [facility] 10/11/11.  . 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 Polysubstance abuse ([place])   . Rhabdomyolysis   . Small bowel obstruction ([place])   . Tattoos   .  Thrombocytopenia ([place])   . Tobacco abuse     Past Surgical History:  Procedure Laterality Date  . APPENDECTOMY    . HERNIA REPAIR    . MUSCLE BIOPSY      Family History  Problem Relation Age of Onset  . Hypertension Mother   . Cancer Father        throat cancer   . Diabetes Brother     Social History   Socioeconomic History  . Marital status: Married    Spouse name: Not on file  . Number of children: Not on file  . Years of education: Not on file  . Highest education level: Not on file  Occupational History  . Not on file  Social Needs  . Financial resource strain: Not hard at all  . Food insecurity  Worry: Never true    Inability: Never true  . Transportation needs    Medical: No    Non-medical: No  Tobacco Use  . Smoking status: Current Every Day Smoker    Packs/day: 1.00    Years: 40.00    Pack years: 40.00    Types: Cigarettes  . Smokeless tobacco: Never Used  Substance and Sexual Activity  . Alcohol use: Yes    Alcohol/week: 0.0 standard drinks    Comment: How much do you drinka day? "As much as I can"  . Drug use: Yes    Types: Cocaine    Comment: opioids   . Sexual activity: Yes  Lifestyle  . Physical activity    Days per week: Not on file    Minutes per session: Not on file  . Stress: Not on file  Relationships  . Social Herbalist on phone: Not on file    Gets together: Not on file    Attends religious service: Not on file    Active member of club or organization: Not on file    Attends meetings of clubs or organizations: Not on file    Relationship status: Not on file  . Intimate partner violence    Fear of current or ex partner: Not on file    Emotionally abused: Not on file    Physically abused: Not on file    Forced sexual activity: Not on file  Other Topics Concern  . Not on file  Social History Narrative  . Not on file     ROS:  General: Negative for anorexia, weight loss, fever, chills, positive fatigue,  positive weakness. Eyes: Negative for vision changes.  ENT: Negative for hoarseness, , nasal congestion.  See HPI CV: Negative for chest pain, angina, palpitations, dyspnea on exertion, peripheral edema.  Respiratory: Negative for dyspnea at rest, dyspnea on exertion, cough, sputum, wheezing.  GI: See history of present illness. GU:  Negative for dysuria, hematuria, urinary incontinence, urinary frequency, nocturnal urination.  MS: Negative for joint pain, low back pain.  Derm: Negative for rash or itching.  Neuro: Negative for weakness, abnormal sensation, seizure, frequent headaches, memory loss, confusion.  Psych: Negative for anxiety, depression, suicidal ideation, hallucinations.  Endo: Negative for unusual weight change.  Heme: Negative for bruising or bleeding. Allergy: Negative for rash or hives.       Physical Examination: Vital signs in last 24 hours: Temp:  [97.9 F (36.6 C)-98.5 F (36.9 C)] 98.5 F (36.9 C) (06/18 0415) Pulse Rate:  [95-119] 102 (06/18 0700) Resp:  [12-27] 21 (06/18 0700) BP: (140-188)/(93-123) 142/110 (06/18 0700) SpO2:  [94 %-100 %] 94 % (06/18 0700) Weight:  [52.1 kg-54.4 kg] 52.1 kg (06/18 0415) Last BM Date: 03/05/19  General: Chronically ill-appearing male in no acute distress.  Appears older than stated age. Head: Normocephalic, atraumatic.   Eyes: Conjunctiva pink, no icterus. Mouth: Oropharyngeal mucosa moist and pink , no lesions erythema or exudate. Neck: Supple without thyromegaly, masses, or lymphadenopathy.  Lungs: Clear to auscultation bilaterally.  Heart: Regular rate and rhythm, no murmurs rubs or gallops.  Abdomen: Bowel sounds are normal, mild epigastric tenderness nondistended, no hepatosplenomegaly or masses, no abdominal bruits or    hernia , no rebound or guarding.   Rectal: Not performed Extremities: No lower extremity edema, clubbing, deformity.  Neuro: Alert and oriented x 4 , grossly normal neurologically.  Skin: Warm  and dry, no rash or jaundice.   Psych: Alert and  cooperative, normal mood and affect.  Appears frustrated        Intake/Output from previous day: 06/17 0701 - 06/18 0700 In: 86.7 [IV Piggyback:86.7] Out: -  Intake/Output this shift: No intake/output data recorded.  Lab Results: CBC Recent Labs    03/05/19 2149 03/06/19 0255  WBC 14.8* 12.4*  HGB 13.0 12.5*  HCT 39.9 38.4*  MCV 85.8 88.1  PLT 416* 311   BMET Recent Labs    03/05/19 2149 03/06/19 0255  NA 135 135  K 4.5 4.1  CL 98 102  CO2 26 24  GLUCOSE 127* 184*  BUN 7 6  CREATININE 1.00 0.83  CALCIUM 8.3* 7.5*   LFT Recent Labs    03/05/19 2149 03/06/19 0255  BILITOT 0.3 0.4  BILIDIR 0.1  --   IBILI 0.2*  --   ALKPHOS 86 78  AST 20 16  ALT 15 14  PROT 6.1* 5.4*  ALBUMIN 2.7* 2.3*    Lipase Recent Labs    03/05/19 2149  LIPASE 26    PT/INR Recent Labs    03/05/19 2149  LABPROT 12.4  INR 0.9      Imaging Studies: Dg Chest 2 View  Result Date: 03/05/2019 CLINICAL DATA:  Chest pain EXAM: CHEST - 2 VIEW COMPARISON:  01/13/2019 FINDINGS: No acute opacity or pleural effusion. Stable cardiomediastinal silhouette with aortic atherosclerosis. No pneumothorax. 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 IMPRESSION: No active cardiopulmonary disease. Electronically Signed   By: Donavan Foil M.D.   On: 03/05/2019 22:32   Ct Angio Chest/abd/pel For Dissection W And/or Wo Contrast  Result Date: 03/06/2019 CLINICAL DATA:  Chest pain EXAM: CT ANGIOGRAPHY CHEST, ABDOMEN AND PELVIS TECHNIQUE: Multidetector CT imaging through the chest, abdomen and pelvis was performed using the standard protocol during bolus administration of intravenous contrast. Multiplanar reconstructed images and MIPs were obtained and reviewed to evaluate the vascular anatomy. CONTRAST:  166m OMNIPAQUE IOHEXOL 350 MG/ML SOLN COMPARISON:  CT abdomen and pelvis 01/06/2019 FINDINGS: CTA CHEST FINDINGS Cardiovascular: Heart is normal size.  Aorta is normal caliber. No evidence of aortic dissection. No filling defects in the pulmonary arteries to suggest pulmonary emboli. Mediastinum/Nodes: No mediastinal, hilar, or axillary adenopathy. Markedly thickened mid to distal esophagus compatible with esophagitis. Lungs/Pleura: Lungs are clear. No focal airspace opacities or suspicious nodules. No effusions. Musculoskeletal: Multiple old left rib fractures involving the 8th through 12th ribs. There is a 2nd fracture through the lateral left 10th rib which could be acute. Review of the MIP images confirms the above findings. CTA ABDOMEN AND PELVIS FINDINGS VASCULAR Aorta: Normal caliber aorta without aneurysm, dissection, vasculitis or significant stenosis. Mild distal aortic atherosclerosis. Celiac: Patent without evidence of aneurysm, dissection, vasculitis or significant stenosis. SMA: Patent without evidence of aneurysm, dissection, vasculitis or significant stenosis. Renals: Both renal arteries are patent without evidence of aneurysm, dissection, vasculitis, fibromuscular dysplasia or significant stenosis. IMA: Patent without evidence of aneurysm, dissection, vasculitis or significant stenosis. Inflow: Atherosclerosis in the iliofemoral vessels. There is moderate to tight stenosis within the right external iliac artery with greater than 70% narrowing focal dissection noted within the right internal iliac artery. Veins: No obvious venous abnormality within the limitations of this arterial phase study. Review of the MIP images confirms the above findings. NON-VASCULAR Hepatobiliary: No focal hepatic abnormality. Gallbladder unremarkable. Pancreas: No focal abnormality or ductal dilatation. Spleen: No focal abnormality.  Normal size. Adrenals/Urinary Tract: No adrenal abnormality. No focal renal abnormality. No stones or hydronephrosis. Urinary bladder is unremarkable.  Stomach/Bowel: There is wall thickening within the descending colon and proximal sigmoid  colon compatible with colitis. Mild circumferential wall thickening within the mid to distal ileum suggesting enteritis. Stomach grossly unremarkable. Lymphatic: No adenopathy Reproductive: No visible focal abnormality. Other: No free fluid or free air. Musculoskeletal: No acute bony abnormality. Review of the MIP images confirms the above findings. IMPRESSION: No evidence of aortic aneurysm or dissection. 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 Distal aortic atherosclerosis. Marked circumferential wall thickening within the mid to distal esophagus compatible with esophagitis. Mild wall thickening within the descending colon and mid to distal ileum compatible with colitis/enteritis. Electronically Signed   By: Rolm Baptise M.D.   On: 03/06/2019 00:40  [4 week]   Impression: 53 year old gentleman with chronic hepatitis C, cocaine abuse, alcohol abuse without overt cirrhosis on imaging although patient declares history of cirrhosis who presents with chest pain after using cocaine and drinking a half a gallon of vodka, taking several Percocets.  Developed epigastric/chest pain associated with vomiting with streaks of fresh blood.  Denies melena.  Hgb over 12. Over the past several weeks he is complained of odynophagia/dysphagia.  CTA showed marked thickening of the mid to distal esophagus.  Patient would benefit from upper endoscopy.  Sedation will be the issue given positive cocaine.  Chronic hepatitis C, treatment nave.  Ongoing alcohol and drug abuse.  No overt cirrhosis on imaging currently.  He has a normal platelet count and INR.  No known prior upper endoscopy.  Without evidence of portal hypertension on imaging, varices unlikely.  Colitis noted on CT. He had similar findings on CT at [facility] in early 2020 at which time he had  enterotoxigenic E. coli.  Would benefit from follow-up colonoscopy as an outpatient.  Plan: 1. PPI BID. 2. Discussed with Dr. Oneida Alar, EGD in 30 days with [facility]. Will need to have negative drug screen.  3. Recommend d/c Octreotide.  4. Consider HCV treatment, will make arrangement for follow up. 5. Consider colonoscopy as an outpatient.  We would like to thank you for the opportunity to participate in the care of [name].  Laureen Ochs. Bernarda Caffey [facility] [phone] 6/18/20202:00 PM     LOS: 0 days

## 2019-03-06 NOTE — H&P (Addendum)
TRH H&P    Patient Demographics:    [name], is a 53 y.o. male  MRN: [id number]  DOB - April 03, 1966  Admit Date - 03/05/2019  Referring MD/NP/PA:  Glynn Octave  Outpatient Primary MD for the patient is Oval Linsey, MD  Patient coming from:  home  Chief complaint-   Chest pain, after drinking   HPI:    [name]  is a 53 y.o. male,  w hx of SBO (prior abdominal surgery as a child for ? Tumor), Genella Rife, Cirrhosis/ Hepatitis C, hx of rhabdomyolysis/ ARF, Etoh abuse, Coccaine abuse, Nicotine dependence, who c/o sharp substernal chest pain after drinking 1 bottle of vodka today. Notes vomitting up some blood,  Pt is not sure if there were coffee grounds. He can not tell me the color of the blood that he vomitted.  Pt denies dysphagia, abd pain, diarrhea, brbpr, black stool.  Pt denies any aspirin or Nsaid use. Unable to find any record of prior EGD/ colonoscopy in Epic.   In ED T 98.5, P 110 R 16, Bp 145/104 pox 100% on RA Wt 52.1kg  Wbc 12.4, Hgb 12.5, Plt 311 Na 135, K 4.1, Bun 6, Creatinine 0.83 Trop <0.03 INR 0.9 Glucose 184 FOBT +  CTA chest/ abdomen/ pelvis IMPRESSION: No evidence of aortic aneurysm or dissection.  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  Distal aortic atherosclerosis.  Marked circumferential wall thickening within the mid to distal esophagus compatible with esophagitis.  Mild wall thickening within the descending colon and mid to distal ileum compatible with colitis/enteritis.  Pt started on protonix  iv x1, then / hour, octreotide started in ED.    ED spoke with Dr. Arbie Cookey for vascular who per ED said no intervention needed at this time for ? Dissection of the right internal iliac artery, follow up in 44month w  vascular.   Pt will be admitted for n/v, hematemesis, and etoh intoxication and coccaine abuse, and chest pain.     Review of systems:    In addition to the HPI above,  No Fever-chills, No Headache, No changes with Vision or hearing, No problems swallowing food or Liquids, No Cough or Shortness of Breath, No Abdominal pain, bowel movements are regular, No Blood in stool or Urine, No dysuria, No new skin rashes or bruises, No new joints pains-aches,  No new weakness, tingling, numbness in any extremity, No recent weight gain or loss, No polyuria, polydypsia or polyphagia, No significant Mental Stressors.  All other systems reviewed and are negative.    Past History of the following :    Past Medical History:  Diagnosis Date   Acute renal failure (HCC)    Secondary to rhabdo. Treated with dialysis short-term.   Alcohol abuse    Psychiatric admissions for alcohol and drug abuse   Back pain, chronic    Chronic leg pain    Chronic pain syndrome    Cirrhosis (HCC)  Depression    Multiple psychiatric admissions   Hepatitis C    History of cocaine abuse (HCC)    Hyponatremia    Low TSH level January 2013   Normal free T4 of 1.06   Myositis    Left deltoid biopsy at [facility] 10/11/11.   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 "As much as I can"       Family History :       Family History  Problem Relation Age of Onset   Hypertension Mother    Cancer Father        throat cancer    Diabetes Brother       Home Medications:   Prior to Admission medications   Not on File     Allergies:     Allergies  Allergen Reactions   Acetaminophen Other (See Comments)    Liver disease      Physical Exam:   Vitals  Blood pressure (!) 145/104, pulse (!) 110, temperature 97.9 F (36.6 C), resp. rate 16, height  (1.727 m), weight 54.4 kg, SpO2 100 %.  1.  General: axox3  2. Psychiatric: euthymic  3. Neurologic: cn2-12 intact, reflexes 2+ symmetric, diffuse with no clonus, motor 5/5 in all 4 ext  4. HEENMT:  Anicteric, pupils 1.6mm symmetric, direct, consensual intact Neck: no jvd  5. Respiratory : CTAB  6. Cardiovascular : rrr s1, s2,   7. Gastrointestinal:  Abd: soft, nt, nd, +bs  8. Skin:  Ext: no c/c/e,  No rash  9.Musculoskeletal:  Good ROM  No adenopathy    Data Review:    CBC Recent Labs  Lab 03/05/19 2149 03/06/19 0255  WBC 14.8* 12.4*  HGB 13.0 12.5*  HCT 39.9 38.4*  PLT 416* 311  MCV 85.8 88.1  MCH 28.0 28.7  MCHC 32.6 32.6  RDW 13.9 13.8   ------------------------------------------------------------------------------------------------------------------  Results for orders placed or performed during the hospital encounter of 03/05/19 (from the past 48 hour(s))  Basic metabolic panel     Status: Abnormal   Collection Time: 03/05/19  9:49 PM  Result Value Ref Range   Sodium 135 135 - 145 mmol/L   Potassium 4.5 3.5 - 5.1 mmol/L   Chloride 98 98 - 111 mmol/L   CO2 26 22 - 32 mmol/L   Glucose, Bld 127 (H) 70 - 99 mg/dL   BUN 7 6 - 20 mg/dL   Creatinine, Ser 5.18 0.61 - 1.24 mg/dL   Calcium 8.3 (L) 8.9 - 10.3 mg/dL   GFR calc non Af Amer >60 >60 mL/min   GFR calc Af Amer >60 >60 mL/min   Anion gap 11 5 - 15    Comment: Performed  at [facility], [address] [place], [place] [address]  CBC      Status: Abnormal   Collection Time: 03/05/19  9:49 PM  Result Value Ref Range   WBC 14.8 (H) 4.0 - 10.5 K/uL   RBC 4.65 4.22 - 5.81 MIL/uL   Hemoglobin 13.0 13.0 - 17.0 g/dL   HCT 19.139.9 47.839.0 - 29.552.0 %   MCV 85.8 80.0 - 100.0 fL   MCH 28.0 26.0 - 34.0 pg   MCHC 32.6 30.0 - 36.0 g/dL   RDW 62.113.9 30.811.5 - 65.715.5 %   Platelets 416 (H) 150 - 400 K/uL   nRBC 0.0 0.0 - 0.2 %    Comment: Performed at [facility], [address] [place], [place] [address]  Troponin I - ONCE - STAT     Status: None   Collection Time: 03/05/19  9:49 PM  Result Value Ref Range   Troponin I <0.03 <0.03 ng/mL    Comment: Performed at [facility], [address] [place], [place] [address]  Hepatic function panel     Status: Abnormal   Collection Time: 03/05/19  9:49 PM  Result Value Ref Range   Total Protein 6.1 (L) 6.5 - 8.1 g/dL   Albumin 2.7 (L) 3.5 - 5.0 g/dL   AST 20 15 - 41 U/L   ALT 15 0 - 44 U/L   Alkaline Phosphatase 86 38 - 126 U/L   Total Bilirubin 0.3 0.3 - 1.2 mg/dL   Bilirubin, Direct 0.1 0.0 - 0.2 mg/dL   Indirect Bilirubin 0.2 (L) 0.3 - 0.9 mg/dL    Comment: Performed at [facility], [address] [place], [place] [address]  Lipase, blood     Status: None   Collection Time: 03/05/19  9:49 PM  Result Value Ref Range   Lipase 26 11 - 51 U/L    Comment: Performed at [facility], [address] [place], [place] [address]  Ethanol     Status: Abnormal   Collection Time: 03/05/19  9:49 PM  Result Value Ref Range   Alcohol, Ethyl (B) 87 (H) <10 mg/dL    Comment: (NOTE) Lowest detectable limit for serum alcohol is 10 mg/dL. For medical purposes only. Performed at [facility], [address] [place], [place] [address]   D-dimer, quantitative (not at [facility])     Status: Abnormal   Collection Time: 03/05/19  9:49 PM  Result Value Ref Range   D-Dimer, Quant 0.78 (H) 0.00 - 0.50 ug/mL-FEU    Comment: (NOTE) At the manufacturer cut-off of 0.50 ug/mL FEU, this assay has been documented to exclude PE  with a sensitivity and negative predictive value of 97 to 99%.  At this time, this assay has not been approved by the FDA to exclude DVT/VTE. Results should be correlated with clinical presentation. Performed at [facility], [address] [place], [place] [address]   Protime-INR     Status: None   Collection Time: 03/05/19  9:49 PM  Result Value Ref Range   Prothrombin Time 12.4 11.4 - 15.2 seconds   INR 0.9 0.8 - 1.2    Comment: (NOTE) INR goal varies based on device and disease states. Performed at [facility], [address] [place], [place] [address]   Rapid urine drug screen (hospital performed)     Status: Abnormal   Collection Time: 03/05/19 10:56 PM  Result Value Ref Range   Opiates NONE DETECTED NONE DETECTED   Cocaine POSITIVE (A) NONE DETECTED   Benzodiazepines  NONE DETECTED NONE DETECTED   Amphetamines NONE DETECTED NONE DETECTED   Tetrahydrocannabinol NONE DETECTED NONE DETECTED   Barbiturates NONE DETECTED NONE DETECTED    Comment: (NOTE) DRUG SCREEN FOR MEDICAL PURPOSES ONLY.  IF CONFIRMATION IS NEEDED FOR ANY PURPOSE, NOTIFY LAB WITHIN 5 DAYS. LOWEST DETECTABLE LIMITS FOR URINE DRUG SCREEN Drug Class                     Cutoff (ng/mL) Amphetamine and metabolites    1000 Barbiturate and metabolites    200 Benzodiazepine                 200 Tricyclics and metabolites     300 Opiates and metabolites        300 Cocaine and metabolites        300 THC                            50 Performed at [facility], [address], [place], [place] [address]   POC occult blood, ED     Status: None   Collection Time: 03/05/19 11:07 PM  Result Value Ref Range   Fecal Occult Bld NEGATIVE NEGATIVE  Occult bld gastric/duodenum (cup to lab)     Status: Abnormal   Collection Time: 03/05/19 11:33 PM  Result Value Ref Range   pH, Gastric 1    Occult Blood, Gastric POSITIVE (A) NEGATIVE    Comment: Performed at [facility], [address] [place], [place] [address]    Troponin I - ONCE - STAT     Status: None   Collection Time: 03/06/19  1:26 AM  Result Value Ref Range   Troponin I <0.03 <0.03 ng/mL    Comment: Performed at [facility], [address], [place], [place] [address]  Comprehensive metabolic panel     Status: Abnormal   Collection Time: 03/06/19  2:55 AM  Result Value Ref Range   Sodium 135 135 - 145 mmol/L   Potassium 4.1 3.5 - 5.1 mmol/L   Chloride 102 98 - 111 mmol/L   CO2 24 22 - 32 mmol/L   Glucose, Bld 184 (H) 70 - 99 mg/dL   BUN 6 6 - 20 mg/dL   Creatinine, Ser 9.14 0.61 - 1.24 mg/dL   Calcium 7.5 (L) 8.9 - 10.3 mg/dL   Total Protein 5.4 (L) 6.5 - 8.1 g/dL   Albumin 2.3 (L) 3.5 - 5.0 g/dL   AST 16 15 - 41 U/L   ALT 14 0 - 44 U/L   Alkaline Phosphatase 78 38 - 126 U/L   Total Bilirubin 0.4 0.3 - 1.2 mg/dL   GFR calc non Af Amer >60 >60 mL/min   GFR calc Af Amer >60 >60 mL/min   Anion gap 9 5 - 15    Comment: Performed at [facility], [address] [place], [place] [address]  CBC     Status: Abnormal   Collection Time: 03/06/19  2:55 AM  Result Value Ref Range   WBC 12.4 (H) 4.0 - 10.5 K/uL   RBC 4.36 4.22 - 5.81 MIL/uL   Hemoglobin 12.5 (L) 13.0 - 17.0 g/dL   HCT 62.1 (L) 30.8 - 65.7 %   MCV 88.1 80.0 - 100.0 fL   MCH 28.7 26.0 - 34.0 pg   MCHC 32.6 30.0 - 36.0 g/dL   RDW 84.6 96.2 - 95.2 %   Platelets 311 150 - 400 K/uL  nRBC 0.0 0.0 - 0.2 %    Comment: Performed at [facility], [address] [place], [place] [address]    Chemistries  Recent Labs  Lab 03/05/19 2149 03/06/19 0255  NA 135 135  K 4.5 4.1  CL 98 102  CO2 26 24  GLUCOSE 127* 184*  BUN 7 6  CREATININE 1.00 0.83  CALCIUM 8.3* 7.5*  AST 20 16  ALT 15 14  ALKPHOS 86 78  BILITOT 0.3 0.4   ------------------------------------------------------------------------------------------------------------------  ------------------------------------------------------------------------------------------------------------------ GFR: Estimated  Creatinine Clearance: 80.1 mL/min (by C-G formula based on SCr of 0.83 mg/dL). Liver Function Tests: Recent Labs  Lab 03/05/19 2149 03/06/19 0255  AST 20 16  ALT 15 14  ALKPHOS 86 78  BILITOT 0.3 0.4  PROT 6.1* 5.4*  ALBUMIN 2.7* 2.3*   Recent Labs  Lab 03/05/19 2149  LIPASE 26   No results for input(s): AMMONIA in the last 168 hours. Coagulation Profile: Recent Labs  Lab 03/05/19 2149  INR 0.9   Cardiac Enzymes: Recent Labs  Lab 03/05/19 2149 03/06/19 0126  TROPONINI <0.03 <0.03   BNP (last 3 results) No results for input(s): PROBNP in the last 8760 hours. HbA1C: No results for input(s): HGBA1C in the last 72 hours. CBG: No results for input(s): GLUCAP in the last 168 hours. Lipid Profile: No results for input(s): CHOL, HDL, LDLCALC, TRIG, CHOLHDL, LDLDIRECT in the last 72 hours. Thyroid Function Tests: No results for input(s): TSH, T4TOTAL, FREET4, T3FREE, THYROIDAB in the last 72 hours. Anemia Panel: No results for input(s): VITAMINB12, FOLATE, FERRITIN, TIBC, IRON, RETICCTPCT in the last 72 hours.  --------------------------------------------------------------------------------------------------------------- Urine analysis:    Component Value Date/Time   COLORURINE YELLOW 01/13/2019 1556   APPEARANCEUR CLEAR 01/13/2019 1556   LABSPEC 1.010 01/13/2019 1556   PHURINE 7.0 01/13/2019 1556   GLUCOSEU NEGATIVE 01/13/2019 1556   HGBUR NEGATIVE 01/13/2019 1556   BILIRUBINUR NEGATIVE 01/13/2019 1556   KETONESUR NEGATIVE 01/13/2019 1556   PROTEINUR NEGATIVE 01/13/2019 1556   UROBILINOGEN 0.2 07/24/2014 1725   NITRITE NEGATIVE 01/13/2019 1556   LEUKOCYTESUR NEGATIVE 01/13/2019 1556      Imaging Results:    Dg Chest 2 View  Result Date: 03/05/2019 CLINICAL DATA:  Chest pain EXAM: CHEST - 2 VIEW COMPARISON:  01/13/2019 FINDINGS: No acute opacity or pleural effusion. Stable cardiomediastinal silhouette with aortic atherosclerosis. No pneumothorax. 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 IMPRESSION: No active cardiopulmonary disease. Electronically Signed   By: Jasmine PangKim  Fujinaga M.D.   On: 03/05/2019 22:32   Ct Angio Chest/abd/pel For Dissection W And/or Wo Contrast  Result Date: 03/06/2019 CLINICAL DATA:  Chest pain EXAM: CT ANGIOGRAPHY CHEST, ABDOMEN AND PELVIS TECHNIQUE: Multidetector CT imaging through the chest, abdomen and pelvis was performed using the standard protocol during bolus administration of intravenous contrast. Multiplanar reconstructed images and MIPs were obtained and reviewed to evaluate the vascular anatomy. CONTRAST:  100mL OMNIPAQUE IOHEXOL 350 MG/ML SOLN COMPARISON:  CT abdomen and pelvis 01/06/2019 FINDINGS: CTA CHEST FINDINGS Cardiovascular: Heart is normal size. Aorta is normal caliber. No evidence of aortic dissection. No filling defects in the pulmonary arteries to suggest pulmonary emboli. Mediastinum/Nodes: No mediastinal, hilar, or axillary adenopathy. Markedly thickened mid to distal esophagus compatible with esophagitis. Lungs/Pleura: Lungs are clear. No focal airspace opacities or suspicious nodules. No effusions. Musculoskeletal: Multiple old left rib fractures involving the 8th through 12th ribs. There is a 2nd fracture through the lateral left 10th rib which could be acute. Review of the MIP images  confirms the above findings. CTA ABDOMEN AND PELVIS FINDINGS VASCULAR Aorta: Normal caliber aorta without aneurysm, dissection, vasculitis or significant stenosis. Mild distal aortic atherosclerosis. Celiac: Patent without evidence of aneurysm, dissection, vasculitis or significant stenosis. SMA: Patent without evidence of aneurysm, dissection, vasculitis or significant stenosis. Renals: Both renal arteries are patent without evidence of aneurysm, dissection, vasculitis, fibromuscular dysplasia or significant stenosis. IMA: Patent without evidence of aneurysm, dissection, vasculitis or significant stenosis. Inflow: Atherosclerosis  in the iliofemoral vessels. There is moderate to tight stenosis within the right external iliac artery with greater than 70% narrowing focal dissection noted within the right internal iliac artery. Veins: No obvious venous abnormality within the limitations of this arterial phase study. Review of the MIP images confirms the above findings. NON-VASCULAR Hepatobiliary: No focal hepatic abnormality. Gallbladder unremarkable. Pancreas: No focal abnormality or ductal dilatation. Spleen: No focal abnormality.  Normal size. Adrenals/Urinary Tract: No adrenal abnormality. No focal renal abnormality. No stones or hydronephrosis. Urinary bladder is unremarkable. Stomach/Bowel: There is wall thickening within the descending colon and proximal sigmoid colon compatible with colitis. Mild circumferential wall thickening within the mid to distal ileum suggesting enteritis. Stomach grossly unremarkable. Lymphatic: No adenopathy Reproductive: No visible focal abnormality. Other: No free fluid or free air. Musculoskeletal: No acute bony abnormality. Review of the MIP images confirms the above findings. IMPRESSION: No evidence of aortic aneurysm or dissection. 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 Distal aortic atherosclerosis. Marked circumferential wall thickening within the mid to distal esophagus compatible with esophagitis. Mild wall thickening within the descending colon and mid to distal ileum compatible with colitis/enteritis. Electronically Signed   By: Charlett NoseKevin  Dover M.D.   On: 03/06/2019 00:40    ekg ST at 120, nl axis, nl int, no st-t changes c/w ischemia   Assessment & Plan:    Principal Problem:   Chest pain Active Problems:   Alcohol abuse   Alcohol intoxication (HCC)   Polysubstance abuse (HCC)   Severe protein-calorie malnutrition (HCC)   Anemia, chronic disease    Hematemesis  Chest pain ? esophagitis Tele Trop I q6h x3 Check hga1c, lipid Check cardiac echo Morphine sulfate 0.5mg  iv q6h x2 No aspirin due to concern for GI bleeding Start Lipitor 10mg  po qhs x2 (while awaiting troponins) Start carvedilol 3.125mg  po bid Check cmp in AM   Hematemesis NPO Received protonix 80mg  iv x1, then protonix 8mg / hr Cont Octreotide for now GI consultation ordered in computer for evaluation  Coccaine abuse Pt counselled on cessation  ETOH abuse/ intoxication CIWA protocol  Severe protein calorie malnutrition Start on Prostat 30mg  po bid after not NPO  Anemia chronic disease Check cbc in am  Hyperglycemia Check hga1c  Nicotine dep Pt counselled on smoking cessation x 3 minutes Nicotine patch offerred, but patient declined.    DVT Prophylaxis-    SCDs   AM Labs Ordered, also please review Full Orders  Family Communication: Admission, patients condition and plan of care including tests being ordered have been discussed with the patient who indicate understanding and agree with the plan and Code Status.  Code Status:  FULL CODE  Admission status: Inpatient: Based on patients clinical presentation and evaluation of above clinical data, I have made determination that patient meets Inpatient criteria at this time.  Pt has question of upper GI bleeding , w hx of cirrhosis, pt started on protonix 80mg  iv x1 then  8mg /hr, pt also started on octreotide by ED.  Will continue for now though no obvious varices seen on CTA chest.  Pt has multiple comorbidities,  etoh abuse, which will likely need monitoring for withdrawal.  Pt's stay will likely last > 2 nites.  Pt will need inpatient stay for hematemesis.    Time spent in minutes : 70   Jani Gravel M.D on 03/06/2019 at 4:16 AM

## 2019-03-06 NOTE — Progress Notes (Signed)
[facility] CARE NOTE Patient: [name] RCB:[id number]   PCP: Lucia Gaskins, MD DOB: April 06, 1966   DOA: 03/05/2019   DOS: 03/06/2019    Patient was admitted by my colleague Dr. Maudie Mercury earlier on 03/06/2019. I have reviewed the H&P as well as assessment and plan and agree with the same. Important changes in the plan are listed below.  Plan of care: Principal Problem:   Chest pain Active Problems:   Alcohol abuse   Alcohol intoxication ([place])   Polysubstance abuse ([place])   Severe protein-calorie malnutrition (HCC)   Anemia, chronic disease   Hematemesis   Esophagitis   Appreciate GI consultation. patient still is on IV Protonix. We will monitor recommendation. H&H relatively stable actually higher than last admission.  Cocaine abuse. Alcohol abuse. Continue CIWA protocol.   Author: Berle Mull, MD [facility] 03/06/2019 3:16 PM   If 7PM-7AM, please contact night-coverage at [email or web address]

## 2019-03-06 NOTE — Progress Notes (Signed)
Initial Nutrition Assessment  DOCUMENTATION CODES:  Underweight  INTERVENTION:  Once diet advanced to Full Liquid,   Ensure Enlive po TID, each supplement provides 350 kcal and 20 grams of protein  MVI with minerals  NUTRITION DIAGNOSIS: Increased nutrient needs related to Cirrhosis as evidenced by the Nutritional guidelines for this disease state.   GOAL:  Patient will meet greater than or equal to 90% of their needs  MONITOR:  Labs, I & O's, Diet advancement, Skin, Supplement acceptance, PO intake, Weight trends  REASON FOR ASSESSMENT:  Malnutrition Screening Tool    ASSESSMENT:  53 y.o male PMHx GERD, etoh/polysubstance abuse, Hep C, Cirrhosis. Presents to ED w/ chest pain x1 hr after taking cocaine, percocet and half gallon of vodka in celebration of his birthday. Reported hematemesis and intermittent dysphagia and odynophagia over past several weeks.  Pt admitted for investigation of CP and GI consult for hematemesis.  RD operating remotely due to [place] precautions. Was able to reach pt by phone, but he was extremely lethargic and could not participate in a conversation. Largely unable to gather any information regarding his nutritional baseline.   He did voice he has not been eating well for an unspecified amount of time, "due to my stomach". Contrary to what pt had told GI PA, he does not feel like he has gained back any of the weight he had lost from his January hospitalization. Per chart/Care Everywhere, pt had been admitted in January at a weight of 123.7 lbs. He is currently 114.9 lbs.   Pt is currently on CL diet. He is unable to have EGD performed d/t +cocaine, though varices not felt likely. Anticipate he will be changed to [facility] diet shortly. Will then order Ensure TID. Given his suspected poor nutritional baseline, will order MVI w/ min as well.   Labs: Na: 134, TG: 240, Albumin: 2.3, A1C:4.6 Meds: Folate, thiamine, ppi, IVF  Recent Labs  Lab 03/05/19 2149  03/06/19 0255 03/06/19 0932  NA 135 135 134*  K 4.5 4.1 4.5  CL 98 102 100  CO2 26 24 23   BUN 7 6 7   CREATININE 1.00 0.83 0.85  CALCIUM 8.3* 7.5* 7.5*  GLUCOSE 127* 184* 161*   NUTRITION - FOCUSED PHYSICAL EXAM: Unable to conduct  Diet Order:   Diet Order            Diet clear liquid Room service appropriate? Yes; Fluid consistency: Thin  Diet effective now             EDUCATION NEEDS:  No education needs have been identified at this time  Skin:  Skin Assessment: Reviewed RN Assessment  Last BM:  6/17  Height:  Ht Readings from Last 1 Encounters:  03/06/19 5\' 8"  (1.727 m)   Weight:  Wt Readings from Last 1 Encounters:  03/06/19 52.1 kg   Wt Readings from Last 10 Encounters:  03/06/19 52.1 kg  01/14/19 53.4 kg  01/09/19 54 kg  01/07/19 55.6 kg  11/09/18 54.3 kg  12/14/17 68 kg  12/10/17 68 kg  02/23/17 68 kg  03/05/16 70.3 kg  09/22/15 65.7 kg   Ideal Body Weight:  70 kg  BMI:  Body mass index is 17.46 kg/m.  Estimated Nutritional Needs:  Kcal:  1800-2100 (35-40 kcal/kg bw) Protein:  90-105g Pro (1.7-2g/kg bw) Fluid:  >1.8L (35 ml/kg bw)  Burtis Junes RD, LDN, CNSC Clinical Nutrition Available Tues-Sat via Pager: [phone] 03/06/2019 5:44 PM

## 2019-03-06 NOTE — TOC Initial Note (Signed)
Transition of Care [facility]) - Initial/Assessment Note    Patient Details  Name: [name] MRN: [id number] Date of Birth: 03-09-1966  Transition of Care [facility]) CM/SW Contact:    Boneta Lucks, RN Phone Number: 03/06/2019, 3:42 PM  Clinical Narrative:      Patient admitted for chest pain, after alcohol and drug use. Patient has high risk score.  Patient is married but lives separate from his wife.  They have a daughter, she is in college and not involved in his care.  His wife and brother will provide transportation as needed. Patient has insurance and See Dr Baxter Kail, has not been in a while. Advised patient we would make a hospital follow up appointment.  Attempted to make appointment, had to leave a voice message for them to call back. Patient states he is not on any mediations, he takes vitamins occasionally. Pt states he is weak, has a wheelchair and cane to use as needed. Offered Patient Home health PT, Patient refused. Offered outpatient PT, patient has limited transportation and could not go on a regular bases and does not want to go. Toc will follow.            Expected Discharge Plan: Home/Self Care     Patient Goals and CMS Choice Patient states their goals for this hospitalization and ongoing recovery are:: to go home.      Expected Discharge Plan and Services Expected Discharge Plan: Home/Self Care                                              Prior Living Arrangements/Services   Lives with:: Self Patient language and need for interpreter reviewed:: Yes Do you feel safe going back to the place where you live?: Yes      Need for Family Participation in Patient Care: Yes (Comment) Care giver support system in place?: Yes (comment)   Criminal Activity/Legal Involvement Pertinent to Current Situation/Hospitalization: No - Comment as needed  Activities of Daily Living Home Assistive Devices/Equipment: None ADL Screening (condition at time of  admission) Patient's cognitive ability adequate to safely complete daily activities?: Yes Is the patient deaf or have difficulty hearing?: No Does the patient have difficulty seeing, even when wearing glasses/contacts?: Yes Does the patient have difficulty concentrating, remembering, or making decisions?: No Patient able to express need for assistance with ADLs?: Yes Does the patient have difficulty dressing or bathing?: No Independently performs ADLs?: Yes (appropriate for developmental age) Does the patient have difficulty walking or climbing stairs?: Yes Weakness of Legs: Both Weakness of Arms/Hands: Both  Permission Sought/Granted                  Emotional Assessment Appearance:: Appears older than stated age   Affect (typically observed): Calm Orientation: : Oriented to Self, Oriented to Place, Oriented to  Time, Oriented to Situation Alcohol / Substance Use: Alcohol Use, Illicit Drugs Psych Involvement: No (comment)  Admission diagnosis:  Esophagitis [K20.9] Nonspecific chest pain [R07.9] Patient Active Problem List   Diagnosis Date Noted  . Hematemesis 03/06/2019  . Chest pain 03/06/2019  . Esophagitis   . Chronic anemia 01/14/2019  . Anemia, chronic disease 01/08/2019  . Severe protein-calorie malnutrition ([place]) 01/07/2019  . History of alcohol use   . Benign essential HTN   . Depression   . SBO (small bowel obstruction) ([place]) 11/09/2018  .  Chronic low back pain 09/22/2015  . Bulging lumbar disc 09/22/2015  . Chronic hepatitis C (HCC) 09/22/2015  . Alcohol dependence (HCC) 06/30/2014  . Polysubstance abuse (HCC) 05/02/2013  . Nausea and vomiting in adult patient 05/02/2013  . Generalized muscle ache 09/06/2012  . 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 Leukocytosis 09/06/2012  . Muscle weakness (generalized) 09/06/2012  . UTI (urinary tract infection) 09/06/2012  . Hyperkalemia 05/28/2012  . Rhabdomyolysis 05/28/2012  . Acute renal failure (HCC) 05/28/2012  .  Hyponatremia 05/28/2012  . Elevated liver enzymes 05/28/2012  . Metabolic encephalopathy 12/11/2011  . Overdose 12/11/2011  . Encephalopathy 10/17/2011  . Tachycardia 10/17/2011  . Dehydration 10/17/2011  . Hypernatremia 10/17/2011  . Hepatitis C 10/17/2011  . Elevated LFTs 10/17/2011  . Alcohol abuse 10/17/2011  . Alcohol intoxication (HCC) 10/17/2011  . Muscular disease 10/17/2011  . Chronic pain syndrome 10/17/2011  . BACK PAIN 06/17/2010  . Myalgia and myositis 06/17/2010  . LEG PAIN, RIGHT 06/17/2010  . Tobacco abuse 11/25/2009  . HEPATITIS C 09/18/1996   PCP:  Oval Linseyondiego, Richard, MD Pharmacy:   [facility] [address] - [place], [place] - [address] [place] [address] [place] [address] [place] [id number] Phone: [phone] Fax: [phone]     Social Determinants of Health (SDOH) Interventions    Readmission Risk Interventions Readmission Risk Prevention Plan 03/06/2019 01/07/2019  Transportation Screening Complete Complete  HRI or Home Care Consult - Complete  Social Work Consult for Recovery Care Planning/Counseling - Complete  Palliative Care Screening - Not Applicable  Medication Review Oceanographer(RN Care Manager) Complete Complete  PCP or Specialist appointment within 3-5 days of discharge Complete -  HRI or Home Care Consult Complete -  SW Recovery Care/Counseling Consult Complete -  Palliative Care Screening Not Complete -  Skilled Nursing Facility Not Complete -  Some recent data might be hidden

## 2019-03-07 ENCOUNTER — Other Ambulatory Visit: Payer: Self-pay | Admitting: *Deleted

## 2019-03-07 ENCOUNTER — Telehealth: Payer: Self-pay | Admitting: Gastroenterology

## 2019-03-07 DIAGNOSIS — K209 Esophagitis, unspecified: Secondary | ICD-10-CM | POA: Diagnosis not present

## 2019-03-07 DIAGNOSIS — R1310 Dysphagia, unspecified: Secondary | ICD-10-CM

## 2019-03-07 DIAGNOSIS — R933 Abnormal findings on diagnostic imaging of other parts of digestive tract: Secondary | ICD-10-CM

## 2019-03-07 DIAGNOSIS — R1319 Other dysphagia: Secondary | ICD-10-CM

## 2019-03-07 DIAGNOSIS — K920 Hematemesis: Secondary | ICD-10-CM

## 2019-03-07 DIAGNOSIS — R079 Chest pain, unspecified: Secondary | ICD-10-CM | POA: Diagnosis not present

## 2019-03-07 LAB — CBC WITH DIFFERENTIAL/PLATELET
Abs Immature Granulocytes: 0.03 10*3/uL (ref 0.00–0.07)
Basophils Absolute: 0.1 10*3/uL (ref 0.0–0.1)
Basophils Relative: 1 %
Eosinophils Absolute: 0.1 10*3/uL (ref 0.0–0.5)
Eosinophils Relative: 2 %
HCT: 40.5 % (ref 39.0–52.0)
Hemoglobin: 12.3 g/dL — ABNORMAL LOW (ref 13.0–17.0)
Immature Granulocytes: 0 %
Lymphocytes Relative: 38 %
Lymphs Abs: 3 10*3/uL (ref 0.7–4.0)
MCH: 27.8 pg (ref 26.0–34.0)
MCHC: 30.4 g/dL (ref 30.0–36.0)
MCV: 91.4 fL (ref 80.0–100.0)
Monocytes Absolute: 0.8 10*3/uL (ref 0.1–1.0)
Monocytes Relative: 10 %
Neutro Abs: 3.9 10*3/uL (ref 1.7–7.7)
Neutrophils Relative %: 49 %
Platelets: 265 10*3/uL (ref 150–400)
RBC: 4.43 MIL/uL (ref 4.22–5.81)
RDW: 13.5 % (ref 11.5–15.5)
WBC: 7.9 10*3/uL (ref 4.0–10.5)
nRBC: 0 % (ref 0.0–0.2)

## 2019-03-07 LAB — COMPREHENSIVE METABOLIC PANEL
ALT: 12 U/L (ref 0–44)
AST: 16 U/L (ref 15–41)
Albumin: 2.2 g/dL — ABNORMAL LOW (ref 3.5–5.0)
Alkaline Phosphatase: 77 U/L (ref 38–126)
Anion gap: 12 (ref 5–15)
BUN: 7 mg/dL (ref 6–20)
CO2: 21 mmol/L — ABNORMAL LOW (ref 22–32)
Calcium: 7.9 mg/dL — ABNORMAL LOW (ref 8.9–10.3)
Chloride: 100 mmol/L (ref 98–111)
Creatinine, Ser: 0.79 mg/dL (ref 0.61–1.24)
GFR calc Af Amer: >60 mL/min (ref >60)
GFR calc non Af Amer: >60 mL/min (ref >60)
Glucose, Bld: 107 mg/dL — ABNORMAL HIGH (ref 70–99)
Potassium: 4.5 mmol/L (ref 3.5–5.1)
Sodium: 133 mmol/L — ABNORMAL LOW (ref 135–145)
Total Bilirubin: 0.5 mg/dL (ref 0.3–1.2)
Total Protein: 5.3 g/dL — ABNORMAL LOW (ref 6.5–8.1)

## 2019-03-07 LAB — MAGNESIUM: Magnesium: 1.9 mg/dL (ref 1.7–2.4)

## 2019-03-07 MED ORDER — ENSURE ENLIVE PO LIQD: 237.0000 mL | Freq: Three times a day (TID) | ORAL | 12 refills | Status: AC

## 2019-03-07 MED ORDER — LIDOCAINE VISCOUS HCL 2 % MT SOLN: 10.0000 mL | Freq: Three times a day (TID) | OROMUCOSAL | 0 refills | Status: AC

## 2019-03-07 MED ORDER — PANTOPRAZOLE SODIUM 40 MG PO TBEC: 40.0000 mg | DELAYED_RELEASE_TABLET | Freq: Two times a day (BID) | ORAL | Status: DC

## 2019-03-07 MED ORDER — FOLIC ACID 1 MG PO TABS: 1.0000 mg | ORAL_TABLET | Freq: Every day | ORAL | 0 refills | Status: AC

## 2019-03-07 MED ORDER — SUCRALFATE 1 GM/10ML PO SUSP: 1.0000 g | Freq: Three times a day (TID) | ORAL | 0 refills | Status: AC

## 2019-03-07 MED ORDER — THIAMINE HCL 100 MG PO TABS: 100.0000 mg | ORAL_TABLET | Freq: Every day | ORAL | 0 refills | Status: AC

## 2019-03-07 MED ORDER — SUCRALFATE 1 GM/10ML PO SUSP
1.0000 g | Freq: Three times a day (TID) | ORAL | Status: DC
  Administered 2019-03-07: 1 g via ORAL
  Filled 2019-03-07: qty 10

## 2019-03-07 MED ORDER — NICOTINE 21 MG/24HR TD PT24: 21.0000 mg | MEDICATED_PATCH | Freq: Every day | TRANSDERMAL | 0 refills | Status: AC

## 2019-03-07 MED ORDER — PANTOPRAZOLE SODIUM 40 MG PO TBEC: 40.0000 mg | DELAYED_RELEASE_TABLET | Freq: Two times a day (BID) | ORAL | 0 refills | Status: AC

## 2019-03-07 NOTE — Telephone Encounter (Signed)
Patient still currently admitted. Will call patient once discharged. Will scheduled procedure for 7/28 at 3pm with [facility].

## 2019-03-07 NOTE — Progress Notes (Addendum)
Subjective:  Feels better. Wants to eat a ribeye.   Objective: Vital signs in last 24 hours: Temp:  [97.5 F (36.4 C)-98.5 F (36.9 C)] 97.5 F (36.4 C) (06/19 0500) Pulse Rate:  [78-107] 79 (06/19 0600) Resp:  [12-24] 13 (06/19 0600) BP: (106-161)/(76-112) 122/95 (06/19 0600) SpO2:  [93 %-100 %] 98 % (06/19 0600) Last BM Date: 03/05/19 General:   Alert,  Well-developed, well-nourished, pleasant and cooperative in NAD Head:  Normocephalic and atraumatic. Eyes:  Sclera clear, no icterus.  Abdomen:  Soft, nontender and nondistended. Neurologic:  Alert and  oriented x4;  grossly normal neurologically. Skin:  Intact without significant lesions or rashes. Psych:  Alert and cooperative. Normal mood and affect.  Intake/Output from previous day: 06/18 0701 - 06/19 0700 In: 1438.2 [I.V.:1438.2] Out: 2200 [Urine:2200] Intake/Output this shift: No intake/output data recorded.  Lab Results: CBC Recent Labs    03/06/19 0255 03/06/19 0932 03/07/19 0425  WBC 12.4* 8.6 7.9  HGB 12.5* 12.0* 12.3*  HCT 38.4* 37.7* 40.5  MCV 88.1 87.7 91.4  PLT 311 333 265   BMET Recent Labs    03/06/19 0255 03/06/19 0932 03/07/19 0425  NA 135 134* 133*  K 4.1 4.5 4.5  CL 102 100 100  CO2 24 23 21*  GLUCOSE 184* 161* 107*  BUN _0 CREATININE 0.83 0.85 0.79  CALCIUM 7.5* 7.5* 7.9*   LFTs Recent Labs    03/05/19 2149 03/06/19 0255 03/06/19 0932 03/07/19 0425  BILITOT 0.3 0.4 0.3 0.5  BILIDIR 0.1  --   --   --   IBILI 0.2*  --   --   --   ALKPHOS 86 78 82 77  AST 20 16 14* 16  ALT _1 PROT 6.1* 5.4* 5.4* 5.3*  ALBUMIN 2.7* 2.3* 2.3* 2.2*   Recent Labs    03/05/19 2149  LIPASE 26   PT/INR Recent Labs    03/05/19 2149  LABPROT 12.4  INR 0.9      Imaging Studies: Dg Chest 2 View  Result Date: 03/05/2019 CLINICAL DATA:  Chest pain EXAM: CHEST - 2 VIEW COMPARISON:  01/13/2019 FINDINGS: No acute opacity or pleural effusion. Stable cardiomediastinal silhouette  with aortic atherosclerosis. No pneumothorax. 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 IMPRESSION: No active cardiopulmonary disease. Electronically Signed   By: Donavan Foil M.D.   On: 03/05/2019 22:32   Ct Angio Chest/abd/pel For Dissection W And/or Wo Contrast  Result Date: 03/06/2019 CLINICAL DATA:  Chest pain EXAM: CT ANGIOGRAPHY CHEST, ABDOMEN AND PELVIS TECHNIQUE: Multidetector CT imaging through the chest, abdomen and pelvis was performed using the standard protocol during bolus administration of intravenous contrast. Multiplanar reconstructed images and MIPs were obtained and reviewed to evaluate the vascular anatomy. CONTRAST:  164m OMNIPAQUE IOHEXOL 350 MG/ML SOLN COMPARISON:  CT abdomen and pelvis 01/06/2019 FINDINGS: CTA CHEST FINDINGS Cardiovascular: Heart is normal size. Aorta is normal caliber. No evidence of aortic dissection. No filling defects in the pulmonary arteries to suggest pulmonary emboli. Mediastinum/Nodes: No mediastinal, hilar, or axillary adenopathy. Markedly thickened mid to distal esophagus compatible with esophagitis. Lungs/Pleura: Lungs are clear. No focal airspace opacities or suspicious nodules. No effusions. Musculoskeletal: Multiple old left rib fractures involving the 8th through 12th ribs. There is a 2nd fracture through the lateral left 10th rib which could be acute. Review of the MIP images confirms the above findings. CTA ABDOMEN AND PELVIS FINDINGS VASCULAR Aorta: Normal caliber aorta without aneurysm, dissection, vasculitis or  significant stenosis. Mild distal aortic atherosclerosis. Celiac: Patent without evidence of aneurysm, dissection, vasculitis or significant stenosis. SMA: Patent without evidence of aneurysm, dissection, vasculitis or significant stenosis. Renals: Both renal arteries are patent without evidence of aneurysm, dissection, vasculitis, fibromuscular dysplasia or significant stenosis. IMA: Patent without evidence of aneurysm, dissection,  vasculitis or significant stenosis. Inflow: Atherosclerosis in the iliofemoral vessels. There is moderate to tight stenosis within the right external iliac artery with greater than 70% narrowing focal dissection noted within the right internal iliac artery. Veins: No obvious venous abnormality within the limitations of this arterial phase study. Review of the MIP images confirms the above findings. NON-VASCULAR Hepatobiliary: No focal hepatic abnormality. Gallbladder unremarkable. Pancreas: No focal abnormality or ductal dilatation. Spleen: No focal abnormality.  Normal size. Adrenals/Urinary Tract: No adrenal abnormality. No focal renal abnormality. No stones or hydronephrosis. Urinary bladder is unremarkable. Stomach/Bowel: There is wall thickening within the descending colon and proximal sigmoid colon compatible with colitis. Mild circumferential wall thickening within the mid to distal ileum suggesting enteritis. Stomach grossly unremarkable. Lymphatic: No adenopathy Reproductive: No visible focal abnormality. Other: No free fluid or free air. Musculoskeletal: No acute bony abnormality. Review of the MIP images confirms the above findings. IMPRESSION: No evidence of aortic aneurysm or dissection. 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 Distal aortic atherosclerosis. Marked circumferential wall thickening within the mid to distal esophagus compatible with esophagitis. Mild wall thickening within the descending colon and mid to distal ileum compatible with colitis/enteritis. Electronically Signed   By: Rolm Baptise M.D.   On: 03/06/2019 00:40  [2 weeks]   Assessment: 53 year old gentleman with chronic hepatitis C, cocaine and alcohol abuse without overt cirrhosis on imaging although patient declares history of cirrhosis who presented with chest pain after using cocaine  and drinking a half a gallon of vodka, taking several Percocets.  Developed epigastric/chest pain associated with vomiting and streaks of fresh blood.  No melena.  Hemoglobin has remained over 12.  He has had several week history of odynophagia/dysphagia.  CTA showed marked thickening of the mid to distal esophagus.  Patient would benefit from an upper endoscopy although given positive urine drug screen for cocaine, he is currently not a candidate for sedation.  Chronic hepatitis C, treatment nave.  Ongoing alcohol and drug abuse.  No overt cirrhosis on imaging currently.  No known prior upper endoscopy.  Colitis noted on CT.  He had similar findings on CT back in early 2020 when he was hospitalized with enterotoxigenic E. coli at [facility].  Would benefit from follow-up colonoscopy as an outpatient.  Plan: 1. Continue twice daily PPI 2. Advance diet 3. Plan for EGD in 30 days with [facility].  Will need to have a negative drug screen.  Discussed this at length with patient. Attempted to call his wife Vaughan Basta, no answer. 4. Consider colonoscopy at time of EGD. 5. Consider HCV treatment, will make arrangements for follow-up.  Laureen Ochs. Bernarda Caffey [facility] [phone] 6/19/20208:55 AM     LOS: 1 day    Addendum: spoke with patient's spouse and updated on plan. She also has concerns that he no longer has a PCP, has not seen Dr. Cindie Laroche in a long time and believes that he can no longer go there. I will let case management know.   Laureen Ochs. Bernarda Caffey [facility] [phone] 6/19/20209:22 AM

## 2019-03-07 NOTE — Telephone Encounter (Signed)
Once he is discharged we will need to look at medications to see if anything will need to be adjusted.

## 2019-03-07 NOTE — TOC Transition Note (Signed)
Transition of Care [facility]) - CM/SW Discharge Note   Patient Details  Name: [name] MRN: [id number] Date of Birth: [birth year]  Transition of Care [facility]) CM/SW Contact:  Shamir Sedlar Dimitri Ped, LCSW Phone Number: 03/07/2019, 12:10 PM   Clinical Narrative:     Discharge instructions provided to pt along with materials on alcohol abuse and nutrition, health risks of smoking and a resource guide for counseling/ substance abuse.  CSW unable to schedule follow up appointment as the office is closed on fridays after 12pm noon. Pt advised to call to schedule appointment within 5-7 days of discharge.   Pt discharging home with transportation from family.   [facility]  Clinical Social Worker  Ph: [phone]   Patient Goals and CMS Choice Patient states their goals for this hospitalization and ongoing recovery are:: to go home.      Discharge Placement                       Discharge Plan and Services                                     Social Determinants of Health (SDOH) Interventions     Readmission Risk Interventions Readmission Risk Prevention Plan 03/07/2019 03/06/2019 01/07/2019  Transportation Screening Complete Complete Complete  HRI or Home Care Consult - - Complete  Social Work Consult for [place] Planning/Counseling - - Complete  Palliative Care Screening - - Not Applicable  Medication Review (RN Care Manager) Complete Complete Complete  PCP or Specialist appointment within 3-5 days of discharge Not Complete Complete -  PCP/Specialist Appt Not Complete comments CSW unable to schedule PCP follow up appointment due to office being closed on Fridays after 12pm. Patient Instructed to call office to schedule follow up appointment. - -  HRI or Home Care Consult Complete Complete -  SW Recovery Care/Counseling Consult Complete Complete -  Palliative Care Screening Not Complete Not Complete -  Skilled Nursing Facility Not  Complete Not Complete -  Some recent data might be hidden

## 2019-03-07 NOTE — Telephone Encounter (Signed)
Will place patient on cancellation list for procedure

## 2019-03-07 NOTE — Telephone Encounter (Signed)
Patient scheduled and I called ICU and nurse will give him appointment date for office visit here.

## 2019-03-07 NOTE — Discharge Instructions (Signed)
Gastritis, Adult Gastritis is inflammation of the stomach. There are two kinds of gastritis:  Acute gastritis. This kind develops suddenly.  Chronic gastritis. This kind is much more common and lasts for a long time. Gastritis happens when the lining of the stomach becomes weak or gets damaged. Without treatment, gastritis can lead to stomach bleeding and ulcers. What are the causes? This condition may be caused by:  An infection.  Drinking too much alcohol.  Certain medicines. These include steroids, antibiotics, and some over-the-counter medicines, such as aspirin or ibuprofen.  Having too much acid in the stomach.  A disease of the intestines or stomach.  Stress.  An allergic reaction.  Crohn's disease.  Some cancer treatments (radiation). Sometimes the cause of this condition is not known. What are the signs or symptoms? Symptoms of this condition include:  Pain or a burning sensation in the upper abdomen.  Nausea.  Vomiting.  An uncomfortable feeling of fullness after eating.  Weight loss.  Bad breath.  Blood in your vomit or stools. In some cases, there are no symptoms. How is this diagnosed? This condition may be diagnosed with:  Your medical history and a description of your symptoms.  A physical exam.  Tests. These can include: ? Blood tests. ? Stool tests. ? A test in which a thin, flexible instrument with a light and a camera is passed down the esophagus and into the stomach (upper endoscopy). ? A test in which a sample of tissue is taken for testing (biopsy). How is this treated? This condition may be treated with medicines. The medicines that are used vary depending on the cause of the gastritis:  If the condition is caused by a bacterial infection, you may be given antibiotic medicines.  If the condition is caused by too much acid in the stomach, you may be given medicines called H2 blockers, proton pump inhibitors, or antacids. Treatment  may also involve stopping the use of certain medicines, such as aspirin, ibuprofen, or other NSAIDs. Follow these instructions at home: Medicines  Take over-the-counter and prescription medicines only as told by your health care provider.  If you were prescribed an antibiotic medicine, take it as told by your health care provider. Do not stop taking the antibiotic even if you start to feel better. Eating and drinking   Eat small, frequent meals instead of large meals.  Avoid foods and drinks that make your symptoms worse.  Drink enough fluid to keep your urine pale yellow. Alcohol use  Do not drink alcohol if: ? Your health care provider tells you not to drink. ? You are pregnant, may be pregnant, or are planning to become pregnant.  If you drink alcohol: ? Limit your use to:  0-1 drink a day for women.  0-2 drinks a day for men. ? Be aware of how much alcohol is in your drink. In the [place] one drink equals one 12 oz bottle of beer (355 mL), one 5 oz glass of wine (148 mL), or one 1 oz glass of hard liquor (44 mL). General instructions  Talk with your health care provider about ways to manage stress, such as getting regular exercise or practicing deep breathing, meditation, or yoga.  Do not use any products that contain nicotine or tobacco, such as cigarettes and e-cigarettes. If you need help quitting, ask your health care provider.  Keep all follow-up visits as told by your health care provider. This is important. Contact a health care provider if:  Your   symptoms get worse.  Your symptoms return after treatment. Get help right away if:  You vomit blood or material that looks like coffee grounds.  You have black or dark red stools.  You are unable to keep fluids down.  Your abdominal pain gets worse.  You have a fever.  You do not feel better after one week. Summary  Gastritis is inflammation of the lining of the stomach that can occur suddenly (acute) or  develop slowly over time (chronic).  This condition is diagnosed with a medical history, a physical exam, or tests.  This condition may be treated with medicines to treat infection or medicines to reduce the amount of acid in your stomach.  Follow your health care provider's instructions about taking medicines, making changes to your diet, and knowing when to call for help. This information is not intended to replace advice given to you by your health care provider. Make sure you discuss any questions you have with your health care provider. Document Released: 08/29/2001 Document Revised: 01/22/2018 Document Reviewed: 01/22/2018 Elsevier Interactive Patient Education  2019 Elsevier Inc.  

## 2019-03-07 NOTE — Telephone Encounter (Signed)
Dr. Oneida Alar wants colonoscopy at time of EGD/ED with [facility] in 30 days. Reason for TCS is abnormal colon on CT.

## 2019-03-07 NOTE — Telephone Encounter (Signed)
[facility] clinical pool Per [facility], schedule EGD/ED with [facility] in 30 days for Dx: odynophagia/dysphagia/abnormal esophagus on CT/hematemesis.   PATIENT WILL NEED NEGATIVE UDS JUST BEFORE PROCEDURE.    Stacey Make hospital follow up in 2 months to discuss TCS/HCV tx.

## 2019-03-07 NOTE — Progress Notes (Signed)
Pt walked out of room telling us to get him a wheelchair and that he can't stay in the room anymore. Informed pt that brother has not arrived yet. Pt stated, "I don't care. I'm gonna walk down there anyway." pt left discharge instructions, charger, and cigarettes in room. NT went down to give pt his items.

## 2019-03-07 NOTE — Progress Notes (Signed)
Discharge instructions provided to pt along with materials on alcohol abuse and nutrition, health risks of smoking and a resource guide for counseling/ substance abuse. Instructions provided on medications that pt is supposed to take. Appointment given and instructed pt to be expecting a call for follow up. Pt verbalized understanding. Brother will be picking up pt.

## 2019-03-09 ENCOUNTER — Emergency Department (HOSPITAL_COMMUNITY): Payer: Medicare PPO

## 2019-03-09 ENCOUNTER — Other Ambulatory Visit: Payer: Self-pay

## 2019-03-09 ENCOUNTER — Encounter (HOSPITAL_COMMUNITY): Payer: Self-pay | Admitting: Emergency Medicine

## 2019-03-09 ENCOUNTER — Observation Stay (HOSPITAL_COMMUNITY)
Admission: EM | Admit: 2019-03-09 | Discharge: 2019-03-11 | Disposition: A | Discharge status: Home / Self Care | Payer: Medicare PPO | Attending: Internal Medicine | Admitting: Internal Medicine

## 2019-03-09 DIAGNOSIS — D72829 Elevated white blood cell count, unspecified: Secondary | ICD-10-CM | POA: Diagnosis present

## 2019-03-09 DIAGNOSIS — K2210 Ulcer of esophagus without bleeding: Secondary | ICD-10-CM

## 2019-03-09 DIAGNOSIS — B1920 Unspecified viral hepatitis C without hepatic coma: Secondary | ICD-10-CM | POA: Diagnosis present

## 2019-03-09 DIAGNOSIS — G894 Chronic pain syndrome: Secondary | ICD-10-CM | POA: Diagnosis not present

## 2019-03-09 DIAGNOSIS — R109 Unspecified abdominal pain: Secondary | ICD-10-CM

## 2019-03-09 DIAGNOSIS — E860 Dehydration: Secondary | ICD-10-CM | POA: Diagnosis not present

## 2019-03-09 DIAGNOSIS — R1030 Lower abdominal pain, unspecified: Secondary | ICD-10-CM | POA: Diagnosis not present

## 2019-03-09 DIAGNOSIS — Z8249 Family history of ischemic heart disease and other diseases of the circulatory system: Secondary | ICD-10-CM | POA: Diagnosis not present

## 2019-03-09 DIAGNOSIS — F329 Major depressive disorder, single episode, unspecified: Secondary | ICD-10-CM | POA: Insufficient documentation

## 2019-03-09 DIAGNOSIS — K210 Gastro-esophageal reflux disease with esophagitis: Secondary | ICD-10-CM | POA: Insufficient documentation

## 2019-03-09 DIAGNOSIS — I10 Essential (primary) hypertension: Secondary | ICD-10-CM | POA: Insufficient documentation

## 2019-03-09 DIAGNOSIS — Z1159 Encounter for screening for other viral diseases: Secondary | ICD-10-CM | POA: Insufficient documentation

## 2019-03-09 DIAGNOSIS — K2211 Ulcer of esophagus with bleeding: Secondary | ICD-10-CM | POA: Insufficient documentation

## 2019-03-09 DIAGNOSIS — R112 Nausea with vomiting, unspecified: Secondary | ICD-10-CM | POA: Diagnosis not present

## 2019-03-09 DIAGNOSIS — Z716 Tobacco abuse counseling: Secondary | ICD-10-CM | POA: Insufficient documentation

## 2019-03-09 DIAGNOSIS — F1010 Alcohol abuse, uncomplicated: Secondary | ICD-10-CM | POA: Diagnosis present

## 2019-03-09 DIAGNOSIS — F17210 Nicotine dependence, cigarettes, uncomplicated: Secondary | ICD-10-CM | POA: Diagnosis not present

## 2019-03-09 DIAGNOSIS — Z7151 Drug abuse counseling and surveillance of drug abuser: Secondary | ICD-10-CM | POA: Insufficient documentation

## 2019-03-09 DIAGNOSIS — K7460 Unspecified cirrhosis of liver: Secondary | ICD-10-CM | POA: Insufficient documentation

## 2019-03-09 DIAGNOSIS — Z886 Allergy status to analgesic agent status: Secondary | ICD-10-CM | POA: Diagnosis not present

## 2019-03-09 DIAGNOSIS — B182 Chronic viral hepatitis C: Secondary | ICD-10-CM | POA: Diagnosis not present

## 2019-03-09 DIAGNOSIS — K56609 Unspecified intestinal obstruction, unspecified as to partial versus complete obstruction: Secondary | ICD-10-CM | POA: Diagnosis present

## 2019-03-09 DIAGNOSIS — K56600 Partial intestinal obstruction, unspecified as to cause: Principal | ICD-10-CM | POA: Diagnosis present

## 2019-03-09 DIAGNOSIS — D72828 Other elevated white blood cell count: Secondary | ICD-10-CM

## 2019-03-09 DIAGNOSIS — F1910 Other psychoactive substance abuse, uncomplicated: Secondary | ICD-10-CM | POA: Diagnosis present

## 2019-03-09 DIAGNOSIS — B1710 Acute hepatitis C without hepatic coma: Secondary | ICD-10-CM | POA: Diagnosis present

## 2019-03-09 DIAGNOSIS — Z8719 Personal history of other diseases of the digestive system: Secondary | ICD-10-CM

## 2019-03-09 LAB — COMPREHENSIVE METABOLIC PANEL
ALT: 14 U/L (ref 0–44)
AST: 16 U/L (ref 15–41)
Albumin: 2.4 g/dL — ABNORMAL LOW (ref 3.5–5.0)
Alkaline Phosphatase: 78 U/L (ref 38–126)
Anion gap: 12 (ref 5–15)
BUN: 17 mg/dL (ref 6–20)
CO2: 22 mmol/L (ref 22–32)
Calcium: 8 mg/dL — ABNORMAL LOW (ref 8.9–10.3)
Chloride: 95 mmol/L — ABNORMAL LOW (ref 98–111)
Creatinine, Ser: 1.11 mg/dL (ref 0.61–1.24)
GFR calc Af Amer: >60 mL/min (ref >60)
GFR calc non Af Amer: >60 mL/min (ref >60)
Glucose, Bld: 166 mg/dL — ABNORMAL HIGH (ref 70–99)
Potassium: 4.1 mmol/L (ref 3.5–5.1)
Sodium: 129 mmol/L — ABNORMAL LOW (ref 135–145)
Total Bilirubin: 0.4 mg/dL (ref 0.3–1.2)
Total Protein: 5.8 g/dL — ABNORMAL LOW (ref 6.5–8.1)

## 2019-03-09 LAB — URINALYSIS, ROUTINE W REFLEX MICROSCOPIC
Bilirubin Urine: NEGATIVE
Glucose, UA: NEGATIVE mg/dL
Hgb urine dipstick: NEGATIVE
Ketones, ur: NEGATIVE mg/dL
Leukocytes,Ua: NEGATIVE
Nitrite: NEGATIVE
Protein, ur: NEGATIVE mg/dL
Specific Gravity, Urine: 1.013 (ref 1.005–1.030)
pH: 5 (ref 5.0–8.0)

## 2019-03-09 LAB — CBC
HCT: 39.6 % (ref 39.0–52.0)
Hemoglobin: 13.3 g/dL (ref 13.0–17.0)
MCH: 28.2 pg (ref 26.0–34.0)
MCHC: 33.6 g/dL (ref 30.0–36.0)
MCV: 84.1 fL (ref 80.0–100.0)
Platelets: 391 10*3/uL (ref 150–400)
RBC: 4.71 MIL/uL (ref 4.22–5.81)
RDW: 13.5 % (ref 11.5–15.5)
WBC: 13.7 10*3/uL — ABNORMAL HIGH (ref 4.0–10.5)
nRBC: 0 % (ref 0.0–0.2)

## 2019-03-09 LAB — LIPASE, BLOOD: Lipase: 21 U/L (ref 11–51)

## 2019-03-09 LAB — SARS CORONAVIRUS 2 BY RT PCR (HOSPITAL ORDER, PERFORMED IN ~~LOC~~ HOSPITAL LAB): SARS Coronavirus 2: NEGATIVE

## 2019-03-09 LAB — ETHANOL: Alcohol, Ethyl (B): <10 mg/dL (ref <10)

## 2019-03-09 MED ORDER — THIAMINE HCL 100 MG/ML IJ SOLN
INTRAVENOUS | Status: DC
  Administered 2019-03-10: 01:00:00 via INTRAVENOUS
  Filled 2019-03-09 (×4): qty 1000

## 2019-03-09 MED ORDER — HYDROMORPHONE HCL 1 MG/ML IJ SOLN
0.5000 mg | INTRAMUSCULAR | Status: DC | PRN
  Administered 2019-03-10 (×5): 1.5 mg via INTRAVENOUS
  Filled 2019-03-09 (×5): qty 1.5

## 2019-03-09 MED ORDER — M.V.I. ADULT IV INJ
INJECTION | INTRAVENOUS | Status: AC
  Filled 2019-03-09: qty 10

## 2019-03-09 MED ORDER — HYDROMORPHONE HCL 1 MG/ML IJ SOLN
1.0000 mg | INTRAMUSCULAR | Status: DC | PRN
  Administered 2019-03-09: 2 mg via INTRAVENOUS
  Filled 2019-03-09: qty 2

## 2019-03-09 MED ORDER — HYDROMORPHONE HCL 1 MG/ML IJ SOLN
1.0000 mg | Freq: Once | INTRAMUSCULAR | Status: AC
  Administered 2019-03-09: 1 mg via INTRAVENOUS
  Filled 2019-03-09: qty 1

## 2019-03-09 MED ORDER — LIDOCAINE VISCOUS HCL 2 % MT SOLN
15.0000 mL | Freq: Once | OROMUCOSAL | Status: AC
  Administered 2019-03-09: 15 mL via ORAL
  Filled 2019-03-09: qty 15

## 2019-03-09 MED ORDER — HYDROMORPHONE HCL 2 MG/ML IJ SOLN
2.0000 mg | Freq: Once | INTRAMUSCULAR | Status: AC
  Administered 2019-03-09: 2 mg via INTRAVENOUS

## 2019-03-09 MED ORDER — ONDANSETRON HCL 4 MG PO TABS: 4.0000 mg | ORAL_TABLET | Freq: Four times a day (QID) | ORAL | Status: DC | PRN

## 2019-03-09 MED ORDER — ONDANSETRON HCL 4 MG/2ML IJ SOLN
4.0000 mg | Freq: Four times a day (QID) | INTRAMUSCULAR | Status: DC | PRN
  Filled 2019-03-09: qty 2

## 2019-03-09 MED ORDER — PANTOPRAZOLE SODIUM 40 MG IV SOLR
40.0000 mg | Freq: Once | INTRAVENOUS | Status: AC
  Administered 2019-03-09: 40 mg via INTRAVENOUS
  Filled 2019-03-09: qty 40

## 2019-03-09 MED ORDER — ALUM & MAG HYDROXIDE-SIMETH 200-200-20 MG/5ML PO SUSP
30.0000 mL | Freq: Once | ORAL | Status: AC
  Administered 2019-03-09: 30 mL via ORAL
  Filled 2019-03-09: qty 30

## 2019-03-09 MED ORDER — THIAMINE HCL 100 MG/ML IJ SOLN
INTRAMUSCULAR | Status: AC
  Filled 2019-03-09: qty 2

## 2019-03-09 MED ORDER — DEXTROSE-NACL 5-0.9 % IV SOLN: INTRAVENOUS | Status: DC

## 2019-03-09 MED ORDER — ENOXAPARIN SODIUM 40 MG/0.4ML ~~LOC~~ SOLN
40.0000 mg | SUBCUTANEOUS | Status: DC
  Administered 2019-03-09 – 2019-03-10 (×2): 40 mg via SUBCUTANEOUS
  Filled 2019-03-09 (×2): qty 0.4

## 2019-03-09 MED ORDER — SODIUM CHLORIDE 0.9 % IV BOLUS
1000.0000 mL | Freq: Once | INTRAVENOUS | Status: AC
  Administered 2019-03-09: 1000 mL via INTRAVENOUS

## 2019-03-09 MED ORDER — PANTOPRAZOLE SODIUM 40 MG IV SOLR
40.0000 mg | INTRAVENOUS | Status: DC
  Administered 2019-03-09 – 2019-03-10 (×2): 40 mg via INTRAVENOUS
  Filled 2019-03-09 (×2): qty 40

## 2019-03-09 MED ORDER — HYDROMORPHONE HCL 1 MG/ML IJ SOLN
0.5000 mg | Freq: Once | INTRAMUSCULAR | Status: AC
  Administered 2019-03-09: 0.5 mg via INTRAVENOUS
  Filled 2019-03-09: qty 1

## 2019-03-09 NOTE — ED Notes (Signed)
Pt aware a urine specimen is needed. Urinal left at bedside.  

## 2019-03-09 NOTE — ED Provider Notes (Signed)
[facility] EMERGENCY DEPARTMENT Provider Note   CSN: [id number] Arrival date & time: 03/09/19  1512    History   Chief Complaint Chief Complaint  Patient presents with  . Abdominal Pain    HPI [name] is a 53 y.o. male with history of acute renal failure, alcohol abuse,  chronic pain syndrome, hepatitis C, cirrhosis, polysubstance abuse, rhabdomyolysis, polymyositis, tobacco abuse presenting for evaluation of ongoing abdominal pain with nausea and vomiting.  Seen and evaluated for similar symptoms on 03/04/2020 his symptoms began.  Had used cocaine earlier in the day.  He was admitted for chest pain work-up, imaging significant for esophagitis.  Discharged with prescriptions for Protonix, Carafate but reports he could not afford the medications as he gets paid once a month.  Reports constant sharp epigastric abdominal pain that worsens with any attempts to eat.  He reports he has been able to tolerate minimal p.o. intake today and has vomited twice.  Emesis has been nonbloody and nonbilious.  Reports constipation, and has not passed flatus for the last 12 hours.  He is a current smoker, denies any recent cocaine use after his admission.  He typically drinks beer daily but on his presentation a few days ago had reportedly drank half a gallon of vodka.  He reports he has not had an alcoholic beverage in the last 2 days.  Denies fevers, chills, chest pain, shortness of breath, or urinary symptoms.     The history is provided by the patient.    Past Medical History:  Diagnosis Date  . Acute renal failure ([place])    Secondary to rhabdo. Treated with dialysis short-term.  . Alcohol abuse    Psychiatric admissions for alcohol and drug abuse  . Back pain, chronic   . Chronic leg pain   . Chronic pain syndrome   . Cirrhosis ([place])    Patient gives history of cirrhosis but imaging does not clearly details cirrhosis.  . Depression    Multiple psychiatric admissions  . Hepatitis C    HCV  AB+2011  . History of cocaine abuse ([place])   . Hyponatremia   . Low TSH level January 2013   Normal free T4 of 1.06  . Myositis    Left deltoid biopsy at [facility] 10/11/11.  . Polymyositis [facility]) January 2011  . Polysubstance abuse ([place])   . Rhabdomyolysis   . Small bowel obstruction ([place])   . Tattoos   . Thrombocytopenia ([place])   . Tobacco abuse     Patient Active Problem List   Diagnosis Date Noted  . Odynophagia   . Esophageal dysphagia   . Hematemesis 03/06/2019  . Chest pain 03/06/2019  . Esophagitis   . Chronic anemia 01/14/2019  . Anemia, chronic disease 01/08/2019  . Severe protein-calorie malnutrition ([place]) 01/07/2019  . History of alcohol use   . Benign essential HTN   . Depression   . SBO (small bowel obstruction) ([place]) 11/09/2018  . Chronic low back pain 09/22/2015  . Bulging lumbar disc 09/22/2015  . Chronic hepatitis C ([place]) 09/22/2015  . Alcohol dependence ([place]) 06/30/2014  . Polysubstance abuse ([place]) 05/02/2013  . Nausea and vomiting in adult patient 05/02/2013  . Generalized muscle ache 09/06/2012  . Polymyositis ([place]) 09/06/2012  . Leukocytosis 09/06/2012  . Muscle weakness (generalized) 09/06/2012  . UTI (urinary tract infection) 09/06/2012  . Hyperkalemia 05/28/2012  . Rhabdomyolysis 05/28/2012  . Acute renal failure ([place]) 05/28/2012  . Hyponatremia 05/28/2012  . Elevated liver enzymes 05/28/2012  .  Metabolic encephalopathy 12/11/2011  . Overdose 12/11/2011  . Encephalopathy 10/17/2011  . Tachycardia 10/17/2011  . Dehydration 10/17/2011  . Hypernatremia 10/17/2011  . Hepatitis C 10/17/2011  . Elevated LFTs 10/17/2011  . Alcohol abuse 10/17/2011  . Alcohol intoxication (HCC) 10/17/2011  . Muscular disease 10/17/2011  . Chronic pain syndrome 10/17/2011  . BACK PAIN 06/17/2010  . Myalgia and myositis 06/17/2010  . LEG PAIN, RIGHT 06/17/2010  . Tobacco abuse 11/25/2009  . HEPATITIS C 09/18/1996    Past Surgical History:  Procedure Laterality  Date  . APPENDECTOMY    . HERNIA REPAIR    . MUSCLE BIOPSY          Home Medications    Prior to Admission medications   Medication Sig Start Date End Date Taking? Authorizing Provider  feeding supplement, ENSURE ENLIVE, (ENSURE ENLIVE) LIQD Take 237 mLs by mouth 3 (three) times daily between meals. Patient not taking: Reported on 03/09/2019 03/07/19   Rolly SalterPatel, Pranav M, MD  folic acid (FOLVITE) 1 MG tablet Take 1 tablet (1 mg total) by mouth daily. Patient not taking: Reported on 03/09/2019 03/08/19   Rolly SalterPatel, Pranav M, MD  lidocaine (XYLOCAINE) 2 % solution Use as directed 10 mLs in the mouth or throat 4 (four) times daily -  with meals and at bedtime. Patient not taking: Reported on 03/09/2019 03/07/19   Rolly SalterPatel, Pranav M, MD  nicotine (NICODERM CQ - DOSED IN MG/24 HOURS) 21 mg/24hr patch Place 1 patch (21 mg total) onto the skin daily. Patient not taking: Reported on 03/09/2019 03/08/19   Rolly SalterPatel, Pranav M, MD  pantoprazole (PROTONIX) 40 MG tablet Take 1 tablet (40 mg total) by mouth 2 (two) times daily before a meal. Patient not taking: Reported on 03/09/2019 03/07/19   Rolly SalterPatel, Pranav M, MD  sucralfate (CARAFATE) 1 GM/10ML suspension Take 10 mLs (1 g total) by mouth 4 (four) times daily -  with meals and at bedtime. Patient not taking: Reported on 03/09/2019 03/07/19   Rolly SalterPatel, Pranav M, MD  thiamine 100 MG tablet Take 1 tablet (100 mg total) by mouth daily. Patient not taking: Reported on 03/09/2019 03/08/19   Rolly SalterPatel, Pranav M, MD    Family History Family History  Problem Relation Age of Onset  . Hypertension Mother   . Cancer Father        throat cancer   . Diabetes Brother     Social History Social History   Tobacco Use  . Smoking status: Current Every Day Smoker    Packs/day: 1.00    Years: 40.00    Pack years: 40.00    Types: Cigarettes  . Smokeless tobacco: Never Used  Substance Use Topics  . Alcohol use: Yes    Alcohol/week: 0.0 standard drinks    Comment: Has not drank in two  days per patient   . Drug use: Yes    Types: Cocaine    Comment: last use two days ago     Allergies   Acetaminophen   Review of Systems Review of Systems  Constitutional: Negative for chills and fever.  Respiratory: Negative for shortness of breath.   Cardiovascular: Negative for chest pain.  Gastrointestinal: Positive for abdominal pain, constipation, nausea and vomiting.  Genitourinary: Negative for dysuria, frequency, hematuria and urgency.  All other systems reviewed and are negative.    Physical Exam Updated Vital Signs BP (!) 161/106   Pulse (!) 107   Temp 98.5 F (36.9 C) (Oral)   Resp 17   Ht 5'  8" (1.727 m)   Wt 51.7 kg   SpO2 100%   BMI 17.33 kg/m   Physical Exam Vitals signs and nursing note reviewed.  Constitutional:      General: He is not in acute distress.    Appearance: He is well-developed.  HENT:     Head: Normocephalic and atraumatic.  Eyes:     General:        Right eye: No discharge.        Left eye: No discharge.     Conjunctiva/sclera: Conjunctivae normal.  Neck:     Vascular: No JVD.     Trachea: No tracheal deviation.  Cardiovascular:     Rate and Rhythm: Regular rhythm. Tachycardia present.  Pulmonary:     Effort: Pulmonary effort is normal.     Breath sounds: Normal breath sounds.  Abdominal:     General: Abdomen is protuberant. Bowel sounds are increased. There is no distension.     Palpations: Abdomen is soft.     Tenderness: There is generalized abdominal tenderness and tenderness in the epigastric area. There is no right CVA tenderness, left CVA tenderness, guarding or rebound.  Skin:    General: Skin is warm and dry.     Findings: No erythema.  Neurological:     Mental Status: He is alert.  Psychiatric:        Behavior: Behavior normal.      ED Treatments / Results  Labs (all labs ordered are listed, but only abnormal results are displayed) Labs Reviewed  COMPREHENSIVE METABOLIC PANEL - Abnormal; Notable for  the following components:      Result Value   Sodium 129 (*)    Chloride 95 (*)    Glucose, Bld 166 (*)    Calcium 8.0 (*)    Total Protein 5.8 (*)    Albumin 2.4 (*)    All other components within normal limits  CBC - Abnormal; Notable for the following components:   WBC 13.7 (*)    All other components within normal limits  URINALYSIS, ROUTINE W REFLEX MICROSCOPIC - Abnormal; Notable for the following components:   APPearance HAZY (*)    All other components within normal limits  SARS CORONAVIRUS 2 (HOSPITAL ORDER, PERFORMED IN [place] [facility])  LIPASE, BLOOD  ETHANOL  RAPID URINE DRUG SCREEN, HOSP PERFORMED    EKG None  Radiology Dg Abd 2 Views  Result Date: 03/09/2019 CLINICAL DATA:  Abdominal pain EXAM: ABDOMEN - 2 VIEW COMPARISON:  01/14/2019, CT 01/06/2019 FINDINGS: No free air beneath the diaphragm. Multiple loops of dilated small bowel measuring up to 4.8 cm with absence of distal gas. No radiopaque calculi. IMPRESSION: Multiple loops of distended small bowel with absence of distal bowel gas, consistent with small bowel obstruction. Negative for free air. Electronically Signed   By: Jasmine PangKim  Fujinaga M.D.   On: 03/09/2019 17:46    Procedures Procedures (including critical care time)  Medications Ordered in ED Medications  pantoprazole (PROTONIX) injection 40 mg (40 mg Intravenous Given 03/09/19 1558)  alum & mag hydroxide-simeth (MAALOX/MYLANTA) 200-200-20 MG/5ML suspension 30 mL (30 mLs Oral Given 03/09/19 1559)    And  lidocaine (XYLOCAINE) 2 % viscous mouth solution 15 mL (15 mLs Oral Given 03/09/19 1558)  sodium chloride 0.9 % bolus 1,000 mL (1,000 mLs Intravenous New Bag/Given 03/09/19 1651)  HYDROmorphone (DILAUDID) injection 0.5 mg (0.5 mg Intravenous Given 03/09/19 1648)  HYDROmorphone (DILAUDID) injection 1 mg (1 mg Intravenous Given 03/09/19 1825)  Initial Impression / Assessment and Plan / ED Course  I have reviewed the triage vital signs and the  nursing notes.  Pertinent labs & imaging results that were available during my care of the patient were reviewed by me and considered in my medical decision making (see chart for details).        Patient presenting for evaluation of ongoing abdominal pain with nausea and vomiting.  He is afebrile, intermittently tachycardic and hypertensive in the ED.  Uncomfortable but nontoxic in appearance.  He has generalized abdominal tenderness worst in the epigastric and periumbilical regions with voluntary guarding noted.  Lab work reviewed by myself shows leukocytosis which is increased from blood work that was obtained a few days ago.  He also has a hyponatremia with sodium 129 which is lower than recent labs.  Otherwise pretty stable.  UA does not suggest UTI or nephrolithiasis.  He had a dissection study of the chest abdomen pelvis 4 days ago which showed findings suggestive of erosive esophagitis.  He has had multiple admissions for small bowel obstruction in the past few months, not many for this particular problem prior to that.  Radiographs today show findings suggestive of small bowel obstruction.  No free air.  I spoke with Dr. Lovell SheehanJenkins with general surgery who is familiar with the patient; apparently the patient is known to leave AMA prior to any surgical interventions that have been offered in the past.  He recommends obtaining a UDS, holding on NG tube due to the erosive esophagitis but can give patient ice chips.  He will see the patient in consultation tomorrow.  I spoke with Dr. Arlean HoppingSchertz with hospitalist service who agrees to assume care of patient and bring him to the hospital for further evaluation and management.  No emesis in the ED thus far, pain mostly controlled while in the ED.   Final Clinical Impressions(s) / ED Diagnoses   Final diagnoses:  SBO (small bowel obstruction) (HCC)  Erosive esophagitis    ED Discharge Orders    None       Bennye AlmFawze, Aquiles Ruffini A, PA-C 03/09/19 1934     Vanetta MuldersZackowski, Scott, MD 03/11/19 [phone]

## 2019-03-09 NOTE — ED Notes (Signed)
Pt returned from xray

## 2019-03-09 NOTE — ED Provider Notes (Signed)
ED ECG REPORT   Date: 03/09/2019  Rate: 113  Rhythm: sinus tachycardia  QRS Axis: normal  Intervals: normal  ST/T Wave abnormalities: normal  Conduction Disutrbances:left anterior fascicular block  Narrative Interpretation:   Old EKG Reviewed: none available  I have personally reviewed the EKG tracing and agree with the computerized printout as noted.      Fredia Sorrow, MD 03/09/19 [phone]

## 2019-03-09 NOTE — ED Triage Notes (Signed)
Pt c/o chronic LT lower abdominal pain with n/v that worsened this morning. Hx of ETOH abuse. States he was prescribed medications last week after his ED encounter, but has not been able to pick them up until 7/6 when his disability check comes in.

## 2019-03-09 NOTE — H&P (Addendum)
[facility] History and Physical  [name] PYK:[id number] DOB: 02-Dec-1965 DOA: 03/09/2019  Referring physician: Dr Rogene Houston PCP: Lucia Gaskins, MD   Chief Complaint: Abd pain and hematemesis, possible SBO  HPI: [name] is a 53 y.o. male w/ hx of etoh abuse, tobacco use, hep C, drug abuse, depression, cirrhosis, chronic pain presented to ED w/ abd pain and N/V.  Was just here w/ similar problems on 6/17.  CT at that time showed esophagitis. DC'd w/ rx for PPI/ carafate but pt couldn't afford them and did not get them filled. Comes back w/ abd pain and emesis, LLQ pain.  In ED VSS, abd xray suggestive of partial SBO.  Asked to see for admission.    Pt is an "alcoholic", drinks different amounts daily depending on "what I can afford".  Lives alone, separated from his wife x 6-7 years, has 3 grown children.  +tob/ etoh.    ABd pain is worst in the LLQ area.  N/V at home , 1-2 x / day.  No fevers, no chills, no constipation or diarrhea, no SOB or CP.    Recent admits:  Feb 2020 - admitted for partial SBO, treated NG tube no surg needed.   April 20- 22, 2020 - admitted for acute SBO, seen by gen surg, recommended conservative Rx.  He decided to leave AMA on 4/22.   April 22- 24, 2020 - readmitted and was seen by surgery, they were planning surgery/ exlap but pt was found eating and smoking cigarettes the morning of surg so the surg was postponed and pt left AMA.    April 27- 29, 2020 - patient was admitted again for abd pain, working dx was recurrent SBO.  Pt was seen by Gen Surg, conservative Rx was recommended as patient's tox screen was + for cocaine. Patient left AMA.   ROS  denies CP  no joint pain   no HA  no blurry vision  no rash  no dysuria  no difficulty voiding  no change in urine color    Past Medical History  Past Medical History:  Diagnosis Date  . Acute renal failure ([place])    Secondary to rhabdo. Treated with dialysis short-term.  . Alcohol abuse     Psychiatric admissions for alcohol and drug abuse  . Back pain, chronic   . Chronic leg pain   . Chronic pain syndrome   . Cirrhosis ([place])    Patient gives history of cirrhosis but imaging does not clearly details cirrhosis.  . Depression    Multiple psychiatric admissions  . Hepatitis C    HCV AB+2011  . History of cocaine abuse ([place])   . Hyponatremia   . Low TSH level January 2013   Normal free T4 of 1.06  . Myositis    Left deltoid biopsy at [facility] 10/11/11.  . Polymyositis [facility]) January 2011  . Polysubstance abuse ([place])   . Rhabdomyolysis   . Small bowel obstruction ([place])   . Tattoos   . Thrombocytopenia ([place])   . Tobacco abuse    Past Surgical History  Past Surgical History:  Procedure Laterality Date  . APPENDECTOMY    . HERNIA REPAIR    . MUSCLE BIOPSY     Family History  Family History  Problem Relation Age of Onset  . Hypertension Mother   . Cancer Father        throat cancer   . Diabetes Brother    Social History  reports that he  has been smoking cigarettes. He has a 40.00 pack-year smoking history. He has never used smokeless tobacco. He reports current alcohol use. He reports current drug use. Drug: Cocaine. Allergies  Allergies  Allergen Reactions  . Acetaminophen Other (See Comments)    Liver disease    Home medications Prior to Admission medications   Medication Sig Start Date End Date Taking? Authorizing Provider  feeding supplement, ENSURE ENLIVE, (ENSURE ENLIVE) LIQD Take 237 mLs by mouth 3 (three) times daily between meals. Patient not taking: Reported on 03/09/2019 03/07/19   Rolly SalterPatel, Pranav M, MD  folic acid (FOLVITE) 1 MG tablet Take 1 tablet (1 mg total) by mouth daily. Patient not taking: Reported on 03/09/2019 03/08/19   Rolly SalterPatel, Pranav M, MD  lidocaine (XYLOCAINE) 2 % solution Use as directed 10 mLs in the mouth or throat 4 (four) times daily -  with meals and at bedtime. Patient not taking: Reported on 03/09/2019 03/07/19   Rolly SalterPatel, Pranav M, MD   nicotine (NICODERM CQ - DOSED IN MG/24 HOURS) 21 mg/24hr patch Place 1 patch (21 mg total) onto the skin daily. Patient not taking: Reported on 03/09/2019 03/08/19   Rolly SalterPatel, Pranav M, MD  pantoprazole (PROTONIX) 40 MG tablet Take 1 tablet (40 mg total) by mouth 2 (two) times daily before a meal. Patient not taking: Reported on 03/09/2019 03/07/19   Rolly SalterPatel, Pranav M, MD  sucralfate (CARAFATE) 1 GM/10ML suspension Take 10 mLs (1 g total) by mouth 4 (four) times daily -  with meals and at bedtime. Patient not taking: Reported on 03/09/2019 03/07/19   Rolly SalterPatel, Pranav M, MD  thiamine 100 MG tablet Take 1 tablet (100 mg total) by mouth daily. Patient not taking: Reported on 03/09/2019 03/08/19   Rolly SalterPatel, Pranav M, MD   Liver Function Tests Recent Labs  Lab 03/06/19 0932 03/07/19 0425 03/09/19 1525  AST 14* 16 16  ALT 12 12 14   ALKPHOS 82 77 78  BILITOT 0.3 0.5 0.4  PROT 5.4* 5.3* 5.8*  ALBUMIN 2.3* 2.2* 2.4*   Recent Labs  Lab 03/05/19 2149 03/09/19 1525  LIPASE 26 21   CBC Recent Labs  Lab 03/06/19 0932 03/07/19 0425 03/09/19 1525  WBC 8.6 7.9 13.7*  NEUTROABS  --  3.9  --   HGB 12.0* 12.3* 13.3  HCT 37.7* 40.5 39.6  MCV 87.7 91.4 84.1  PLT 333 265 391   Basic Metabolic Panel Recent Labs  Lab 03/05/19 2149 03/06/19 0255 03/06/19 0932 03/07/19 0425 03/09/19 1525  NA 135 135 134* 133* 129*  K 4.5 4.1 4.5 4.5 4.1  CL 98 102 100 100 95*  CO2 26 24 23  21* 22  GLUCOSE 127* 184* 161* 107* 166*  BUN 7 6 7 7 17   CREATININE 1.00 0.83 0.85 0.79 1.11  CALCIUM 8.3* 7.5* 7.5* 7.9* 8.0*     Vitals:   03/09/19 1730 03/09/19 1745 03/09/19 1800 03/09/19 1830  BP: (!) 140/101  (!) 145/96 (!) 161/106  Pulse: 97 98 96 (!) 107  Resp: 18 14 15 17   Temp:      TempSrc:      SpO2: 99% 99% 99% 100%  Weight:      Height:       Exam: Gen alert, chron ill appearing, no distress No rash, cyanosis or gangrene Sclera anicteric, throat clear and slightly dry  No jvd or bruits Chest clear  bilat to bases RRR no MRG Abd firm, some voluntary guarding, tender LLQ, no rebound, dec'd BS  GU normal  male MS no joint effusions or deformity Ext no LE edema / no wounds or ulcers Neuro is alert, Ox 3 , nf     Home meds:  - sucralfate 1gm qid/ pantoprazole 40 bid  - nicotine patch 21 mg qd  - prn's/ vitamins/ supplements   Na 129  K 4.1  BN 17  Cr 1.11  Alb 2.4  AST 16 ALT 14  Tbili 0.4     WBC 13k  Hb 13   UA negative   Abd xray > IMPRESSION: Multiple loops of distended small bowel with absence of distal bowel gas, consistent with small bowel obstruction. Negative for free air  EKG (independ reviewed) today > sinus tach 113, no acute changes   Recent CTA chest/ abd / pelv for dissection on 6/18 >  IMPRESSION: -No evidence of aortic aneurysm or dissection. 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 -Distal aortic atherosclerosis. -Marked circumferential wall thickening within the mid to distal esophagus compatible with esophagitis. -Mild wall thickening within the descending colon and mid to distal ileum compatible with colitis/enteritis.     Assessment: 1. Abdominal pain/ N/V - SBO vs pSBO per xrays. Recent admits for the same.  Complicating factors are drug and etoh abuse, ongoing. Plan admit, gen surg consulted by ED and surg recommends ice chips and no NG for now given last CT results of esophagitis. They will see pt tomorrow.  Pain meds ordered prn dilaudid.  2. ETOH abuse 3. Dehydration - start IVF"s 4. Drug abuse 5. H/o hep C 6. H/o cirrhosis - compensated 7. Tobacco use     Plan - as above   DVT prophylaxis: lovenox Family communication: none here Code status: full code Admit status: med-surg Bed type: Inpatient     Vinson Moselleob Deborahann Poteat MD  pgr [phone] 03/09/2019, 8:50 PM  If 7PM-7AM, please contact  night-coverage [email or web address] Password [email or web address] 03/09/2019, 8:50 PM

## 2019-03-10 DIAGNOSIS — K56609 Unspecified intestinal obstruction, unspecified as to partial versus complete obstruction: Secondary | ICD-10-CM

## 2019-03-10 LAB — BASIC METABOLIC PANEL
Anion gap: 8 (ref 5–15)
BUN: 16 mg/dL (ref 6–20)
CO2: 24 mmol/L (ref 22–32)
Calcium: 7.5 mg/dL — ABNORMAL LOW (ref 8.9–10.3)
Chloride: 99 mmol/L (ref 98–111)
Creatinine, Ser: 0.99 mg/dL (ref 0.61–1.24)
GFR calc Af Amer: >60 mL/min (ref >60)
GFR calc non Af Amer: >60 mL/min (ref >60)
Glucose, Bld: 74 mg/dL (ref 70–99)
Potassium: 4.3 mmol/L (ref 3.5–5.1)
Sodium: 131 mmol/L — ABNORMAL LOW (ref 135–145)

## 2019-03-10 LAB — CBC
HCT: 37.2 % — ABNORMAL LOW (ref 39.0–52.0)
Hemoglobin: 11.9 g/dL — ABNORMAL LOW (ref 13.0–17.0)
MCH: 28 pg (ref 26.0–34.0)
MCHC: 32 g/dL (ref 30.0–36.0)
MCV: 87.5 fL (ref 80.0–100.0)
Platelets: 334 10*3/uL (ref 150–400)
RBC: 4.25 MIL/uL (ref 4.22–5.81)
RDW: 13.8 % (ref 11.5–15.5)
WBC: 8.4 10*3/uL (ref 4.0–10.5)
nRBC: 0 % (ref 0.0–0.2)

## 2019-03-10 LAB — RAPID URINE DRUG SCREEN, HOSP PERFORMED
Amphetamines: NOT DETECTED
Barbiturates: NOT DETECTED
Benzodiazepines: POSITIVE — AB
Cocaine: POSITIVE — AB
Opiates: POSITIVE — AB
Tetrahydrocannabinol: NOT DETECTED

## 2019-03-10 LAB — MAGNESIUM: Magnesium: 1.8 mg/dL (ref 1.7–2.4)

## 2019-03-10 MED ORDER — THIAMINE HCL 100 MG/ML IJ SOLN
Freq: Every day | INTRAVENOUS | Status: DC
  Administered 2019-03-10: 17:00:00 via INTRAVENOUS
  Filled 2019-03-10 (×2): qty 1000

## 2019-03-10 MED ORDER — HYDROMORPHONE HCL 1 MG/ML IJ SOLN
0.5000 mg | INTRAMUSCULAR | Status: DC | PRN
  Administered 2019-03-10 – 2019-03-11 (×10): 1 mg via INTRAVENOUS
  Filled 2019-03-10 (×10): qty 1

## 2019-03-10 MED ORDER — ENSURE ENLIVE PO LIQD
237.0000 mL | Freq: Three times a day (TID) | ORAL | Status: DC
  Administered 2019-03-10 – 2019-03-11 (×4): 237 mL via ORAL

## 2019-03-10 MED ORDER — NICOTINE 7 MG/24HR TD PT24
7.0000 mg | MEDICATED_PATCH | Freq: Every day | TRANSDERMAL | Status: DC
  Administered 2019-03-10 – 2019-03-11 (×2): 7 mg via TRANSDERMAL
  Filled 2019-03-10 (×2): qty 1

## 2019-03-10 MED ORDER — SODIUM CHLORIDE 0.9 % IV SOLN
INTRAVENOUS | Status: DC
  Administered 2019-03-10 – 2019-03-11 (×2): via INTRAVENOUS

## 2019-03-10 NOTE — Care Management CC44 (Signed)
Condition Code 44 Documentation Completed  Patient Details  Name: REGIE BUNNER MRN: [id number] Date of Birth: 1966/05/25   Condition Code 44 given:  Yes Patient signature on Condition Code 44 notice:  Yes Documentation of 2 MD's agreement:  Yes Code 44 added to claim:  Yes    Zayra Devito, Chauncey Reading, RN 03/10/2019, 2:00 PM

## 2019-03-10 NOTE — Progress Notes (Signed)
PROGRESS NOTE    Ryan MontaneJames W Waren  IHK:[id number] DOB: [birth year] DOA: 03/09/2019 PCP: Oval Linseyondiego, Richard, MD   Brief Narrative:  Per HPI: [name] is a 53 y.o. male w/ hx of etoh abuse, tobacco use, hep C, drug abuse, depression, cirrhosis, chronic pain presented to ED w/ abd pain and N/V.  Was just here w/ similar problems on 6/17.  CT at that time showed esophagitis. DC'd w/ rx for PPI/ carafate but pt couldn't afford them and did not get them filled. Comes back w/ abd pain and emesis, LLQ pain.  In ED VSS, abd xray suggestive of partial SBO.  Asked to see for admission.    Pt is an "alcoholic", drinks different amounts daily depending on "what I can afford".  Lives alone, separated from his wife x 6-7 years, has 3 grown children.  +tob/ etoh.    ABd pain is worst in the LLQ area.  N/V at home , 1-2 x / day.  No fevers, no chills, no constipation or diarrhea, no SOB or CP.   Patient was admitted with likely partial small bowel obstruction as well as some GERD/esophagitis.  His diet has been advanced to soft and he appears to be tolerating this well.  Assessment & Plan:   Principal Problem:   SBO (small bowel obstruction) (HCC) Active Problems:   HEPATITIS C   Dehydration   Hepatitis C   Alcohol abuse   Leukocytosis   Polysubstance abuse (HCC)   Nausea and vomiting in adult patient   History of cirrhosis   Partial SBO with esophagitis -Advanced diet to soft today and appears to be tolerating -Appreciate general surgery recommendations -Continue monitor while on IV fluids and anticipate discharge if tolerating diet and having bowel movements -Patient will need to remain on GERD and Carafate and avoid alcohol use  Alcohol abuse -Patient agrees to work with AA and stop drinking in the near future -We will provide resources on discharge  Polysubstance abuse -Counseled on cessation  History of cirrhosis with hep C -Compensated  Tobacco abuse -Counseled on cessation and  offered nicotine patch   DVT prophylaxis: Lovenox Code Status: Full Family Communication: None at bedside Disposition Plan: Advance diet as tolerated and anticipate discharge in a.m. if stable and improved.   Consultants:   General surgery  Procedures:   None  Antimicrobials:   None   Subjective: Patient seen and evaluated today with no new acute complaints or concerns. No acute concerns or events noted overnight.  He has passed bowel movement and flatus and appears to be tolerating full liquid diet.  Objective: Vitals:   03/10/19 0239 03/10/19 0500 03/10/19 0637 03/10/19 1428  BP: (!) 126/91  (!) 148/96 (!) 150/98  Pulse: (!) 104  93 82  Resp:   17 17  Temp:   97.9 F (36.6 C) 97.6 F (36.4 C)  TempSrc:    Oral  SpO2: 100%  100% 100%  Weight:  53.5 kg    Height:        Intake/Output Summary (Last 24 hours) at 03/10/2019 1455 Last data filed at 03/10/2019 1300 Gross per 24 hour  Intake 915.79 ml  Output 400 ml  Net 515.79 ml   Filed Weights   03/09/19 1514 03/09/19 2259 03/10/19 0500  Weight: 51.7 kg 53.5 kg 53.5 kg    Examination:  General exam: Appears calm and comfortable  Respiratory system: Clear to auscultation. Respiratory effort normal. Cardiovascular system: S1 & S2 heard, RRR. No JVD, murmurs,  rubs, gallops or clicks. No pedal edema. Gastrointestinal system: Abdomen is nondistended, soft and nontender. No organomegaly or masses felt. Normal bowel sounds heard. Central nervous system: Alert and oriented. No focal neurological deficits. Extremities: Symmetric 5 x 5 power. Skin: No rashes, lesions or ulcers Psychiatry: Judgement and insight appear normal. Mood & affect appropriate.     Data Reviewed: I have personally reviewed following labs and imaging studies  CBC: Recent Labs  Lab 03/06/19 0255 03/06/19 0932 03/07/19 0425 03/09/19 1525 03/10/19 0535  WBC 12.4* 8.6 7.9 13.7* 8.4  NEUTROABS  --   --  3.9  --   --   HGB 12.5* 12.0*  12.3* 13.3 11.9*  HCT 38.4* 37.7* 40.5 39.6 37.2*  MCV 88.1 87.7 91.4 84.1 87.5  PLT 311 333 265 391 334   Basic Metabolic Panel: Recent Labs  Lab 03/06/19 0255 03/06/19 0932 03/07/19 0425 03/09/19 1525 03/10/19 0535  NA 135 134* 133* 129* 131*  K 4.1 4.5 4.5 4.1 4.3  CL 102 100 100 95* 99  CO2 24 23 21* 22 24  GLUCOSE 184* 161* 107* 166* 74  BUN 6 7 7 17 16   CREATININE 0.83 0.85 0.79 1.11 0.99  CALCIUM 7.5* 7.5* 7.9* 8.0* 7.5*  MG  --   --  1.9  --  1.8   GFR: Estimated Creatinine Clearance: 66 mL/min (by C-G formula based on SCr of 0.99 mg/dL). Liver Function Tests: Recent Labs  Lab 03/05/19 2149 03/06/19 0255 03/06/19 0932 03/07/19 0425 03/09/19 1525  AST 20 16 14* 16 16  ALT 15 14 12 12 14   ALKPHOS 86 78 82 77 78  BILITOT 0.3 0.4 0.3 0.5 0.4  PROT 6.1* 5.4* 5.4* 5.3* 5.8*  ALBUMIN 2.7* 2.3* 2.3* 2.2* 2.4*   Recent Labs  Lab 03/05/19 2149 03/09/19 1525  LIPASE 26 21   No results for input(s): AMMONIA in the last 168 hours. Coagulation Profile: Recent Labs  Lab 03/05/19 2149  INR 0.9   Cardiac Enzymes: Recent Labs  Lab 03/05/19 2149 03/06/19 0126 03/06/19 0932 03/06/19 1456  CKTOTAL  --   --  73  --   CKMB  --   --  7.7*  --   TROPONINI <0.03 <0.03 <0.03 <0.03   BNP (last 3 results) No results for input(s): PROBNP in the last 8760 hours. HbA1C: No results for input(s): HGBA1C in the last 72 hours. CBG: No results for input(s): GLUCAP in the last 168 hours. Lipid Profile: No results for input(s): CHOL, HDL, LDLCALC, TRIG, CHOLHDL, LDLDIRECT in the last 72 hours. Thyroid Function Tests: No results for input(s): TSH, T4TOTAL, FREET4, T3FREE, THYROIDAB in the last 72 hours. Anemia Panel: No results for input(s): VITAMINB12, FOLATE, FERRITIN, TIBC, IRON, RETICCTPCT in the last 72 hours. Sepsis Labs: No results for input(s): PROCALCITON, LATICACIDVEN in the last 168 hours.  Recent Results (from the past 240 hour(s))  SARS Coronavirus 2  ([facility] - [facility]formed [facility]), Hosp Order     Status: None   Collection Time: 03/06/19  1:09 AM   Specimen: Nasopharyngeal Swab  Result Value Ref Range Status   SARS Coronavirus 2 NEGATIVE NEGATIVE Final    Comment: (NOTE) If result is NEGATIVE SARS-CoV-2 target nucleic acids are NOT DETECTED. The SARS-CoV-2 RNA is generally detectable in upper and lower  respiratory specimens during the acute phase of infection. The lowest  concentration of SARS-CoV-2 viral copies this assay can detect is 250  copies / mL. A negative result does  not preclude SARS-CoV-2 infection  and should not be used as the sole basis for treatment or other  patient management decisions.  A negative result may occur with  improper specimen collection / handling, submission of specimen other  than nasopharyngeal swab, presence of viral mutation(s) within the  areas targeted by this assay, and inadequate number of viral copies  (<250 copies / mL). A negative result must be combined with clinical  observations, patient history, and epidemiological information. If result is POSITIVE SARS-CoV-2 target nucleic acids are DETECTED. The SARS-CoV-2 RNA is generally detectable in upper and lower  respiratory specimens dur ing the acute phase of infection.  Positive  results are indicative of active infection with SARS-CoV-2.  Clinical  correlation with patient history and other diagnostic information is  necessary to determine patient infection status.  Positive results do  not rule out bacterial infection or co-infection with other viruses. If result is PRESUMPTIVE POSTIVE SARS-CoV-2 nucleic acids MAY BE PRESENT.   A presumptive positive result was obtained on the submitted specimen  and confirmed on repeat testing.  While 2019 novel coronavirus  (SARS-CoV-2) nucleic acids may be present in the submitted sample  additional confirmatory testing may be necessary for epidemiological  and / or clinical  management purposes  to differentiate between  SARS-CoV-2 and other Sarbecovirus currently known to infect humans.  If clinically indicated additional testing with an alternate test  methodology [phone]) is advised. The SARS-CoV-2 RNA is generally  detectable in upper and lower respiratory sp ecimens during the acute  phase of infection. The expected result is Negative. Fact Sheet for Patients:  StrictlyIdeas.no Fact Sheet for Healthcare Providers: BankingDealers.co.za This test is not yet approved or cleared by the [place] FDA and has been authorized for detection and/or diagnosis of SARS-CoV-2 by FDA under an Emergency Use Authorization (EUA).  This EUA will remain in effect (meaning this test can be used) for the duration of the COVID-19 declaration under Section 564(b)(1) of the Act, 21 U.S.C. section 360bbb-3(b)(1), unless the authorization is terminated or revoked sooner. Performed at [facility]: [address], [address] [place], [place] [address]   MRSA PCR Screening     Status: None   Collection Time: 03/06/19  3:37 AM   Specimen: Nasal Mucosa; Nasopharyngeal  Result Value Ref Range Status   MRSA by PCR NEGATIVE NEGATIVE Final    Comment:        The GeneXpert MRSA Assay (FDA approved for NASAL specimens only), is one component of a comprehensive MRSA colonization surveillance program. It is not intended to diagnose MRSA infection nor to guide or monitor treatment for MRSA infections. Performed at [facility], [address] [place], [place] [address]   SARS Coronavirus 2 ([place] - Performed in [facility] hospital lab), Hosp Order     Status: None   Collection Time: 03/09/19  6:28 PM   Specimen: Nasopharyngeal Swab  Result Value Ref Range Status   SARS Coronavirus 2 NEGATIVE NEGATIVE Final    Comment: (NOTE) If result is NEGATIVE SARS-CoV-2 target nucleic acids are NOT DETECTED. The SARS-CoV-2 RNA is generally  detectable in upper and lower  respiratory specimens during the acute phase of infection. The lowest  concentration of SARS-CoV-2 viral copies this assay can detect is 250  copies / mL. A negative result does not preclude SARS-CoV-2 infection  and should not be used as the sole basis for treatment or other  patient management decisions.  A negative result may occur with  improper  specimen collection / handling, submission of specimen other  than nasopharyngeal swab, presence of viral mutation(s) within the  areas targeted by this assay, and inadequate number of viral copies  (<250 copies / mL). A negative result must be combined with clinical  observations, patient history, and epidemiological information. If result is POSITIVE SARS-CoV-2 target nucleic acids are DETECTED. The SARS-CoV-2 RNA is generally detectable in upper and lower  respiratory specimens dur ing the acute phase of infection.  Positive  results are indicative of active infection with SARS-CoV-2.  Clinical  correlation with patient history and other diagnostic information is  necessary to determine patient infection status.  Positive results do  not rule out bacterial infection or co-infection with other viruses. If result is PRESUMPTIVE POSTIVE SARS-CoV-2 nucleic acids MAY BE PRESENT.   A presumptive positive result was obtained on the submitted specimen  and confirmed on repeat testing.  While 2019 novel coronavirus  (SARS-CoV-2) nucleic acids may be present in the submitted sample  additional confirmatory testing may be necessary for epidemiological  and / or clinical management purposes  to differentiate between  SARS-CoV-2 and other Sarbecovirus currently known to infect humans.  If clinically indicated additional testing with an alternate test  methodology [phone]([id number]) is advised. The SARS-CoV-2 RNA is generally  detectable in upper and lower respiratory sp ecimens during the acute  phase of infection. The  expected result is Negative. Fact Sheet for Patients:  BoilerBrush.com.cyhttps://www.fda.gov/media/136312/download Fact Sheet for Healthcare Providers: https://pope.com/https://www.fda.gov/media/136313/download This test is not yet approved or cleared by the [place] FDA and has been authorized for detection and/or diagnosis of SARS-CoV-2 by FDA under an Emergency Use Authorization (EUA).  This EUA will remain in effect (meaning this test can be used) for the duration of the COVID-19 declaration under Section 564(b)(1) of the Act, 21 U.S.C. section 360bbb-3(b)(1), unless the authorization is terminated or revoked sooner. Performed at [facility], [address] [place], [place] [address]          Radiology Studies: Dg Abd 2 Views  Result Date: 03/09/2019 CLINICAL DATA:  Abdominal pain EXAM: ABDOMEN - 2 VIEW COMPARISON:  01/14/2019, CT 01/06/2019 FINDINGS: No free air beneath the diaphragm. Multiple loops of dilated small bowel measuring up to 4.8 cm with absence of distal gas. No radiopaque calculi. IMPRESSION: Multiple loops of distended small bowel with absence of distal bowel gas, consistent with small bowel obstruction. Negative for free air. Electronically Signed   By: Jasmine PangKim  Fujinaga M.D.   On: 03/09/2019 17:46        Scheduled Meds: . enoxaparin (LOVENOX) injection  40 mg Subcutaneous Q24H  . feeding supplement (ENSURE ENLIVE)  237 mL Oral TID BM  . nicotine  7 mg Transdermal Daily  . pantoprazole (PROTONIX) IV  40 mg Intravenous Q24H   Continuous Infusions: . sodium chloride 75 mL/hr at 03/10/19 1031  . banana bag IV 1000 mL       LOS: 1 day    Time spent: 30 minutes    Heleena Miceli Hoover BrunetteD Avia Merkley, DO [facility] Pager [phone]  If 7PM-7AM, please contact night-coverage [email or web address] Password [email or web address] 03/10/2019, 2:55 PM

## 2019-03-10 NOTE — Progress Notes (Signed)
Pt states he is tolerating the full liquid diet well, wants to advance his diet. Per general sx orders, diet advanced to soft. Will continue to monitor.

## 2019-03-10 NOTE — Care Management Obs Status (Signed)
[place] NOTIFICATION   Patient Details  Name: [name] MRN: [id number] Date of Birth: 1966-06-10   Medicare Observation Status Notification Given:  Yes    Briggette Najarian, Chauncey Reading, RN 03/10/2019, 2:00 PM

## 2019-03-10 NOTE — Consult Note (Signed)
Reason for Consult: Recurrent bowel obstruction Referring Physician: Dr. Tereasa CoopShah/Patel  [name] is an 53 y.o. male.  HPI: Patient is a 53 year old white male well-known to me who presented back to the emergency room with a 24-hour history of worsening nausea and vomiting.  A KUB was performed which revealed bowel dilatation with distal bowel collapse.  He has had multiple admissions for nonspecific nausea and vomiting as well as esophagitis.  He has left AMA multiple times prior to any surgical intervention.  He states that his nausea and vomiting developed yesterday.  He currently has left upper quadrant abdominal pain.  He had a bowel movement and passed flatus this morning.  He is hungry and wants his diet advanced.  Past Medical History:  Diagnosis Date  . Acute renal failure (HCC)    Secondary to rhabdo. Treated with dialysis short-term.  . Alcohol abuse    Psychiatric admissions for alcohol and drug abuse  . Back pain, chronic   . Chronic leg pain   . Chronic pain syndrome   . Cirrhosis (HCC)    Patient gives history of cirrhosis but imaging does not clearly details cirrhosis.  . Depression    Multiple psychiatric admissions  . Hepatitis C    HCV AB+2011  . History of cocaine abuse (HCC)   . Hyponatremia   . Low TSH level January 2013   Normal free T4 of 1.06  . Myositis    [facility] del[facility]11[facility].  . 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 Polysubstance abuse (HCC)   . Rhabdomyolysis   . Small bowel obstruction (HCC)   . Tattoos   . Thrombocytopenia (HCC)   . Tobacco abuse     Past Surgical History:  Procedure Laterality Date  . APPENDECTOMY    . HERNIA REPAIR    . MUSCLE BIOPSY      Family History  Problem Relation Age of Onset  . Hypertension Mother   . Cancer Father        throat cancer   . Diabetes Brother     Social History:  reports that he has been smoking cigarettes. He has a 40.00 pack-year smoking history. He has never used smokeless tobacco.  He reports current alcohol use. He reports current drug use. Drug: Cocaine.  Allergies:  Allergies  Allergen Reactions  . Acetaminophen Other (See Comments)    Liver disease     Medications: I have reviewed the patient's current medications.  Results for orders placed or performed during the hospital encounter of 03/09/19 (from the past 48 hour(s))  Lipase, blood     Status: None   Collection Time: 03/09/19  3:25 PM  Result Value Ref Range   Lipase 21 11 - 51 U/L    Comment: Performed at [facility], [address] [place], [place] [address]  Comprehensive metabolic panel     Status: Abnormal   Collection Time: 03/09/19  3:25 PM  Result Value Ref Range   Sodium 129 (L) 135 - 145 mmol/L   Potassium 4.1 3.5 - 5.1 mmol/L   Chloride 95 (L) 98 - 111 mmol/L   CO2 22 22 - 32 mmol/L   Glucose, Bld 166 (H) 70 - 99 mg/dL   BUN 17 6 - 20 mg/dL   Creatinine, Ser 6.041.11 0.61 - 1.24 mg/dL   Calcium 8.0 (L) 8.9 - 10.3 mg/dL   Total Protein 5.8 (L) 6.5 - 8.1 g/dL   Albumin 2.4 (L) 3.5 - 5.0 g/dL  AST 16 15 - 41 U/L   ALT 14 0 - 44 U/L   Alkaline Phosphatase 78 38 - 126 U/L   Total Bilirubin 0.4 0.3 - 1.2 mg/dL   GFR calc non Af Amer >60 >60 mL/min   GFR calc Af Amer >60 >60 mL/min   Anion gap 12 5 - 15    Comment: Performed at [facility], [address] [place], [place] [address]  CBC     Status: Abnormal   Collection Time: 03/09/19  3:25 PM  Result Value Ref Range   WBC 13.7 (H) 4.0 - 10.5 K/uL   RBC 4.71 4.22 - 5.81 MIL/uL   Hemoglobin 13.3 13.0 - 17.0 g/dL   HCT 39.6 39.0 - 52.0 %   MCV 84.1 80.0 - 100.0 fL   MCH 28.2 26.0 - 34.0 pg   MCHC 33.6 30.0 - 36.0 g/dL   RDW 13.5 11.5 - 15.5 %   Platelets 391 150 - 400 K/uL   nRBC 0.0 0.0 - 0.2 %    Comment: Performed at [facility], [address], [place], [place] [address]  Ethanol     Status: None   Collection Time: 03/09/19  3:25 PM  Result Value Ref Range   Alcohol, Ethyl (B) <10 <10 mg/dL    Comment: (NOTE) Lowest  detectable limit for serum alcohol is 10 mg/dL. For medical purposes only. Performed at [facility], [address] [place],  [address]   Urinalysis, Routine w reflex microscopic     Status: Abnormal   Collection Time: 03/09/19  6:28 PM  Result Value Ref Range   Color, Urine YELLOW YELLOW   APPearance HAZY (A) CLEAR   Specific Gravity, Urine 1.013 1.005 - 1.030   pH 5.0 5.0 - 8.0   Glucose, UA NEGATIVE NEGATIVE mg/dL   Hgb urine dipstick NEGATIVE NEGATIVE   Bilirubin Urine NEGATIVE NEGATIVE   Ketones, ur NEGATIVE NEGATIVE mg/dL   Protein, ur NEGATIVE NEGATIVE mg/dL   Nitrite NEGATIVE NEGATIVE   Leukocytes,Ua NEGATIVE NEGATIVE    Comment: Performed at [facility], [address] [place],  [address]  SARS Coronavirus 2 (CEPHEID - Performed in [facility] hospital lab), Hosp Order     Status: None   Collection Time: 03/09/19  6:28 PM   Specimen: Nasopharyngeal Swab  Result Value Ref Range   SARS Coronavirus 2 NEGATIVE NEGATIVE    Comment: (NOTE) If result is NEGATIVE SARS-CoV-2 target nucleic acids are NOT DETECTED. The SARS-CoV-2 RNA is generally detectable in upper and lower  respiratory specimens during the acute phase of infection. The lowest  concentration of SARS-CoV-2 viral copies this assay can detect is 250  copies / mL. A negative result does not preclude SARS-CoV-2 infection  and should not be used as the sole basis for treatment or other  patient management decisions.  A negative result may occur with  improper specimen collection / handling, submission of specimen other  than nasopharyngeal swab, presence of viral mutation(s) within the  areas targeted by this assay, and inadequate number of viral copies  (<250 copies / mL). A negative result must be combined with clinical  observations, patient history, and epidemiological information. If result is POSITIVE SARS-CoV-2 target nucleic acids are DETECTED. The SARS-CoV-2 RNA is generally detectable in  upper and lower  respiratory specimens dur ing the acute phase of infection.  Positive  results are indicative of active infection with SARS-CoV-2.  Clinical  correlation with patient history and other diagnostic information is  necessary to determine patient infection status.  Positive results do  not rule out bacterial infection or co-infection with other viruses. If result is PRESUMPTIVE POSTIVE SARS-CoV-2 nucleic acids MAY BE PRESENT.   A presumptive positive result was obtained on the submitted specimen  and confirmed on repeat testing.  While 2019 novel coronavirus  (SARS-CoV-2) nucleic acids may be present in the submitted sample  additional confirmatory testing may be necessary for epidemiological  and / or clinical management purposes  to differentiate between  SARS-CoV-2 and other Sarbecovirus currently known to infect humans.  If clinically indicated additional testing with an alternate test  methodology [phone]([id number]) is advised. The SARS-CoV-2 RNA is generally  detectable in upper and lower respiratory sp ecimens during the acute  phase of infection. The expected result is Negative. Fact Sheet for Patients:  BoilerBrush.com.cyhttps://www.fda.gov/media/136312/download Fact Sheet for Healthcare Providers: https://pope.com/https://www.fda.gov/media/136313/download This test is not yet approved or cleared by the [place] FDA and has been authorized for detection and/or diagnosis of SARS-CoV-2 by FDA under an Emergency Use Authorization (EUA).  This EUA will remain in effect (meaning this test can be used) for the duration of the COVID-19 declaration under Section 564(b)(1) of the Act, 21 U.S.C. section 360bbb-3(b)(1), unless the authorization is terminated or revoked sooner. Performed at [facility], [address] [place], [place] [address]   Rapid urine drug screen (hospital performed)     Status: Abnormal   Collection Time: 03/10/19  4:15 AM  Result Value Ref Range   Opiates POSITIVE (A) NONE DETECTED    Cocaine POSITIVE (A) NONE DETECTED   Benzodiazepines POSITIVE (A) NONE DETECTED   Amphetamines NONE DETECTED NONE DETECTED   Tetrahydrocannabinol NONE DETECTED NONE DETECTED   Barbiturates NONE DETECTED NONE DETECTED    Comment: (NOTE) DRUG SCREEN FOR MEDICAL PURPOSES ONLY.  IF CONFIRMATION IS NEEDED FOR ANY PURPOSE, NOTIFY LAB WITHIN 5 DAYS. LOWEST DETECTABLE LIMITS FOR URINE DRUG SCREEN Drug Class                     Cutoff (ng/mL) Amphetamine and metabolites    1000 Barbiturate and metabolites    200 Benzodiazepine                 200 Tricyclics and metabolites     300 Opiates and metabolites        300 Cocaine and metabolites        300 THC                            50 Performed at [facility], [address] [place], [place] [address]   Basic metabolic panel     Status: Abnormal   Collection Time: 03/10/19  5:35 AM  Result Value Ref Range   Sodium 131 (L) 135 - 145 mmol/L   Potassium 4.3 3.5 - 5.1 mmol/L   Chloride 99 98 - 111 mmol/L   CO2 24 22 - 32 mmol/L   Glucose, Bld 74 70 - 99 mg/dL   BUN 16 6 - 20 mg/dL   Creatinine, Ser 4.780.99 0.61 - 1.24 mg/dL   Calcium 7.5 (L) 8.9 - 10.3 mg/dL   GFR calc non Af Amer >60 >60 mL/min   GFR calc Af Amer >60 >60 mL/min   Anion gap 8 5 - 15    Comment: Performed at [facility], [address] [place], [place] [address]  CBC     Status: Abnormal  Collection Time: 03/10/19  5:35 AM  Result Value Ref Range   WBC 8.4 4.0 - 10.5 K/uL   RBC 4.25 4.22 - 5.81 MIL/uL   Hemoglobin 11.9 (L) 13.0 - 17.0 g/dL   HCT 08.637.2 (L) 57.839.0 - 46.952.0 %   MCV 87.5 80.0 - 100.0 fL   MCH 28.0 26.0 - 34.0 pg   MCHC 32.0 30.0 - 36.0 g/dL   RDW 62.913.8 52.811.5 - 41.315.5 %   Platelets 334 150 - 400 K/uL   nRBC 0.0 0.0 - 0.2 %    Comment: Performed at [facility], [address] [place], [place] [address]  Magnesium     Status: None   Collection Time: 03/10/19  5:35 AM  Result Value Ref Range   Magnesium 1.8 1.7 - 2.4 mg/dL    Comment: Performed at [facility], [address] [place], [place] [address]    Dg Abd 2 Views  Result Date: 03/09/2019 CLINICAL DATA:  Abdominal pain EXAM: ABDOMEN - 2 VIEW COMPARISON:  01/14/2019, CT 01/06/2019 FINDINGS: No free air beneath the diaphragm. Multiple loops of dilated small bowel measuring up to 4.8 cm with absence of distal gas. No radiopaque calculi. IMPRESSION: Multiple loops of distended small bowel with absence of distal bowel gas, consistent with small bowel obstruction. Negative for free air. Electronically Signed   By: Jasmine PangKim  Fujinaga M.D.   On: 03/09/2019 17:46    ROS:  Pertinent items are noted in HPI.  Blood pressure (!) 148/96, pulse 93, temperature 97.9 F (36.6 C), resp. rate 17, height 5\' 7"  (1.702 m), weight 53.5 kg, SpO2 100 %. Physical Exam: Pleasant white male no acute distress Head is normocephalic, atraumatic Lungs clear to auscultation with good breath sounds bilaterally Heart examination reveals regular rate and rhythm without S3, S4, murmurs Abdomen is soft and flat.  No tenderness was elicited.  No hepatosplenomegaly is noted.  No hernias are noted.  No rigidity is noted.  X-ray reviewed  Assessment/Plan: Impression: Partial small bowel obstruction.  Given the fact that this appears to be noncritical, urgent surgical intervention is not warranted.  He is cocaine positive.  He was told again that no procedure could be done unless he was cocaine negative.  In addition, it is difficult to certain whether his nausea and vomiting is secondary to drug use, alcohol use, or mechanical partial bowel obstruction. Plan: Will advance diet as tolerated.  No need for surgical intervention at this time.  Discussed with Dr. Clelia CroftShaw.  Franky MachoMark Ashni Lonzo 03/10/2019, 8:37 AM

## 2019-03-10 NOTE — TOC Initial Note (Signed)
Transition of Care [facility](TOC) - Initial/Assessment Note    Patient Details  Name: [name] MRN: [id number] Date of Birth: [birth year]  Transition of Care [facility](TOC) CM/SW Contact:    Betania Dizon, Chrystine OilerSharley Diane, RN Phone Number: 03/10/2019, 2:07 PM  Clinical Narrative:     Patient is from home alone, has wife, reports they do not live together all the time.  Has PCP, family transports him to appointments. Has cane and WC at home.  Patient has history of drug/alcohol abuse.  Recurrent readmissions for small bowel obstructions.  Patient is not interested in any home services, just received information on alcohol and substance abuse on 03/07/19.   Expected Discharge Plan: Home/Self Care Barriers to Discharge: No Barriers Identified   Patient Goals and CMS Choice Patient states their goals for this hospitalization and ongoing recovery are:: return home in good shape      Expected Discharge Plan and Services Expected Discharge Plan: Home/Self Care     Prior Living Arrangements/Services   Lives with:: Self   Do you feel safe going back to the place where you live?: Yes      Need for Family Participation in Patient Care: Yes (Comment) Care giver support system in place?: Yes (comment) Current home services: DME(cane, WC) Criminal Activity/Legal Involvement Pertinent to Current Situation/Hospitalization: No - Comment as needed  Activities of Daily Living Home Assistive Devices/Equipment: None ADL Screening (condition at time of admission) Patient's cognitive ability adequate to safely complete daily activities?: Yes Is the patient deaf or have difficulty hearing?: No Does the patient have difficulty seeing, even when wearing glasses/contacts?: No Does the patient have difficulty concentrating, remembering, or making decisions?: No Patient able to express need for assistance with ADLs?: Yes Does the patient have difficulty dressing or bathing?: No Independently performs ADLs?: Yes  (appropriate for developmental age) Does the patient have difficulty walking or climbing stairs?: Yes Weakness of Legs: Both Weakness of Arms/Hands: Both  Emotional Assessment   Attitude/Demeanor/Rapport: Engaged     Alcohol / Substance Use: Illicit Drugs, Alcohol Use Psych Involvement: No (comment)  Admission diagnosis:  Erosive esophagitis [K22.10] SBO (small bowel obstruction) (HCC) [K56.609] Abdominal pain [R10.9] Patient Active Problem List   Diagnosis Date Noted  . History of cirrhosis 03/09/2019  . Abdominal pain   . Odynophagia   . Esophageal dysphagia   . Hematemesis 03/06/2019  . Chest pain 03/06/2019  . Esophagitis   . Chronic anemia 01/14/2019  . Anemia, chronic disease 01/08/2019  . Severe protein-calorie malnutrition (HCC) 01/07/2019  . History of alcohol use   . Benign essential HTN   . Depression   . SBO (small bowel obstruction) (HCC) 11/09/2018  . Chronic low back pain 09/22/2015  . Bulging lumbar disc 09/22/2015  . Chronic hepatitis C (HCC) 09/22/2015  . Alcohol dependence (HCC) 06/30/2014  . Polysubstance abuse (HCC) 05/02/2013  . Nausea and vomiting in adult patient 05/02/2013  . Generalized muscle ache 09/06/2012  . 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 Leukocytosis 09/06/2012  . Muscle weakness (generalized) 09/06/2012  . UTI (urinary tract infection) 09/06/2012  . Hyperkalemia 05/28/2012  . Rhabdomyolysis 05/28/2012  . Acute renal failure (HCC) 05/28/2012  . Hyponatremia 05/28/2012  . Elevated liver enzymes 05/28/2012  . Metabolic encephalopathy 12/11/2011  . Overdose 12/11/2011  . Encephalopathy 10/17/2011  . Tachycardia 10/17/2011  . Dehydration 10/17/2011  . Hypernatremia 10/17/2011  . Hepatitis C 10/17/2011  . Elevated LFTs 10/17/2011  . Alcohol abuse 10/17/2011  . Alcohol intoxication (HCC) 10/17/2011  . Muscular disease  10/17/2011  . Chronic pain syndrome 10/17/2011  . BACK PAIN 06/17/2010  . Myalgia and myositis 06/17/2010  . LEG  PAIN, RIGHT 06/17/2010  . Tobacco abuse 11/25/2009  . HEPATITIS C 09/18/1996   PCP:  Lucia Gaskins, MD Pharmacy:   [facility] [address], [place] [address] [place] [address] [place] [address] Phone: [phone] Fax: [phone]     Social Determinants of Health (SDOH) Interventions    Readmission Risk Interventions Readmission Risk Prevention Plan 03/07/2019 03/06/2019 01/07/2019  Transportation Screening Complete Complete Complete  HRI or Home Care Consult - - Complete  Social Work Consult for [place] Planning/Counseling - - Complete  Palliative Care Screening - - Not Applicable  Medication Review Press photographer) Complete Complete Complete  PCP or Specialist appointment within 3-5 days of discharge Not Complete Complete -  PCP/Specialist Appt Not Complete comments CSW unable to schedule PCP follow up appointment due to office being closed on Fridays after 12pm. Patient Instructed to call office to schedule follow up appointment. - -  HRI or Home Care Consult Complete Complete -  SW Recovery Care/Counseling Consult Complete Complete -  Palliative Care Screening Not Complete Not Complete -  Skilled Nursing Facility Not Complete Not Complete -  Some recent data might be hidden

## 2019-03-10 NOTE — Progress Notes (Signed)
Notified by telemetry stating that patient had 10 beat run of SVT in the 150's. Vitals signs stable. Heart rate in the 110's on telemetry at this time. Dr. Maudie Mercury notified. Will continue to monitor.

## 2019-03-10 NOTE — Progress Notes (Signed)
Nutrition Follow-up  DOCUMENTATION CODES:   Underweight  INTERVENTION:  Ensure Enlive po TID, each supplement provides 350 kcal and 20 grams of protein -between meals.    NUTRITION DIAGNOSIS:  Increased nutrient needs related to chronic illness(cirrhosis , recurrent abdominal pain, ongoing ETOH, cocaine abuse) as evidenced by (patient medical history, est needs in setting of liver disease).  GOAL:  Patient will meet greater than or equal to 90% of their needs, Weight gain(ideally wt gain would be desired but unlikely unless patient changes his polysubstance abuse lifestyle)  MONITOR:  PO intake, Supplement acceptance, Diet advancement, Labs, Weight trends   REASON FOR ASSESSMENT:   Malnutrition Screening Tool    ASSESSMENT:  Patient is an underweight 53 yo male who presents with abdominal pain. He has a known hx of cocaine, ETOH and tobacco abuse. 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 Hx also includes cirrhosis, hepatitis C and chronic pain syndrome.  Patient was NPO initially but now has been cleared by surgeon for diet advancement. No surgical intervention planned at this time. He is tolerating full liquids (100% consumed) and would like to have regular texture foods. Patient says he believes his abdominal distress was a result of excess ETOH intake.   When pt questioned about usual food intake his reply "[employer]" which he explained is a beer. Unable to get patient to provide actual food intake history. Patient did say his wife tries to get him to eat.    Patient weighed 68 kg 15 months ago but since February this year has ranged between 52-56 kg-underweight. Expect he continues to be malnourished based on our conversation-limited nutrition intake of essential nutrients and underweight status.  Medications reviewed and include: Protonix, Lovenox.   Labs: Albumin BMP Latest Ref Rng & Units 03/10/2019 03/09/2019 03/07/2019  Glucose 70 - 99 mg/dL 74 166(H) 107(H)  BUN 6 -  20 mg/dL 16 17 7   Creatinine 0.61 - 1.24 mg/dL 0.99 1.11 0.79  Sodium 135 - 145 mmol/L 131(L) 129(L) 133(L)  Potassium 3.5 - 5.1 mmol/L 4.3 4.1 4.5  Chloride 98 - 111 mmol/L 99 95(L) 100  CO2 22 - 32 mmol/L 24 22 21(L)  Calcium 8.9 - 10.3 mg/dL 7.5(L) 8.0(L) 7.9(L)     NUTRITION - FOCUSED PHYSICAL EXAM: pending  Diet Order:   Diet Order            Diet full liquid Room service appropriate? Yes; Fluid consistency: Thin  Diet effective now              EDUCATION NEEDS: Attempted to address- informed pt of increased protein /energy needs with liver disease. He denies having cirrhosis.   Education needs have been addressed Skin:  Skin Assessment: Reviewed RN Assessment(ecchymosis to bilateral arms/ legs)  Last BM:  6/20  Height:   Ht Readings from Last 1 Encounters:  03/09/19 5\' 7"  (1.702 m)    Weight:   Wt Readings from Last 1 Encounters:  03/10/19 53.5 kg    Ideal Body Weight:  67 kg  BMI:  Body mass index is 18.47 kg/m.  Estimated Nutritional Needs:   Kcal:  9417-4081 (35-38 kcal/kg/bw)  Protein:  86-97 gr (1.6-1.8 gr/kg/bw)  Fluid:  1600 ml daily (30 ml/kg/bw)   Colman Cater MS,RD,CSG,LDN Office: [phone] Pager: [phone]

## 2019-03-11 DIAGNOSIS — K56609 Unspecified intestinal obstruction, unspecified as to partial versus complete obstruction: Secondary | ICD-10-CM | POA: Diagnosis not present

## 2019-03-11 LAB — CBC
HCT: 34.9 % — ABNORMAL LOW (ref 39.0–52.0)
Hemoglobin: 10.8 g/dL — ABNORMAL LOW (ref 13.0–17.0)
MCH: 27.4 pg (ref 26.0–34.0)
MCHC: 30.9 g/dL (ref 30.0–36.0)
MCV: 88.6 fL (ref 80.0–100.0)
Platelets: 327 10*3/uL (ref 150–400)
RBC: 3.94 MIL/uL — ABNORMAL LOW (ref 4.22–5.81)
RDW: 13.5 % (ref 11.5–15.5)
WBC: 7.1 10*3/uL (ref 4.0–10.5)
nRBC: 0 % (ref 0.0–0.2)

## 2019-03-11 LAB — BASIC METABOLIC PANEL
Anion gap: 7 (ref 5–15)
BUN: 13 mg/dL (ref 6–20)
CO2: 25 mmol/L (ref 22–32)
Calcium: 7.7 mg/dL — ABNORMAL LOW (ref 8.9–10.3)
Chloride: 100 mmol/L (ref 98–111)
Creatinine, Ser: 0.75 mg/dL (ref 0.61–1.24)
GFR calc Af Amer: >60 mL/min (ref >60)
GFR calc non Af Amer: >60 mL/min (ref >60)
Glucose, Bld: 82 mg/dL (ref 70–99)
Potassium: 4.9 mmol/L (ref 3.5–5.1)
Sodium: 132 mmol/L — ABNORMAL LOW (ref 135–145)

## 2019-03-11 LAB — MAGNESIUM: Magnesium: 1.8 mg/dL (ref 1.7–2.4)

## 2019-03-11 MED ORDER — LIDOCAINE VISCOUS HCL 2 % MT SOLN
15.0000 mL | Freq: Once | OROMUCOSAL | Status: AC
  Administered 2019-03-11: 15 mL via ORAL
  Filled 2019-03-11: qty 15

## 2019-03-11 MED ORDER — PEG 3350-KCL-NA BICARB-NACL 420 G PO SOLR: 4000.0000 mL | Freq: Once | ORAL | 0 refills | Status: AC

## 2019-03-11 MED ORDER — ALUM & MAG HYDROXIDE-SIMETH 200-200-20 MG/5ML PO SUSP
30.0000 mL | Freq: Once | ORAL | Status: AC
  Administered 2019-03-11: 12:00:00 30 mL via ORAL
  Filled 2019-03-11: qty 30

## 2019-03-11 NOTE — Addendum Note (Signed)
Addended by: Inge Rise on: 03/11/2019 03:53 PM   Modules accepted: Orders

## 2019-03-11 NOTE — Progress Notes (Signed)
Pt is stating he is not ready for D/C and wants to speak with the doctor. MD made aware. States he will come see patient shortly. Will continue to monitor.

## 2019-03-11 NOTE — Discharge Summary (Signed)
[facility] Discharge Summary   Patient: [name] JXB:[id number]   PCP: Oval Linseyondiego, Richard, MD DOB: 09/15/1966   Date of admission: 03/05/2019   Date of discharge: 03/07/2019     Discharge Diagnoses:   Principal Problem:   Chest pain Active Problems:   Alcohol abuse   Alcohol intoxication (HCC)   Polysubstance abuse (HCC)   Severe protein-calorie malnutrition (HCC)   Anemia, chronic disease   Hematemesis   Esophagitis   Odynophagia   Esophageal dysphagia   Admitted From: Home Disposition:  Home   Recommendations for Outpatient Follow-up:  1. Please follow-up with PCP in 1 week. 2. Please follow-up with gastroenterology as recommended  Follow-up Information    Oval Linseyondiego, Richard, MD. Schedule an appointment as soon as possible for a visit in 1 week(s).   Specialty: Internal Medicine Contact information: [address] [place] [address] [phone]        West BaliFields, Sandi L, MD. Schedule an appointment as soon as possible for a visit in 2 month(s).   Specialty: Gastroenterology Contact information: [address] [place] [address] [phone]          Diet recommendation: Cardiac diet  Activity: The patient is advised to gradually reintroduce usual activities,as tolerated .  Discharge Condition: good  Code Status: Full code  History of present illness: As per the H and P dictated on admission, " [name]  is a 53 y.o. male,  w hx of SBO (prior abdominal surgery as a child for ? Tumor), Genella RifeGerd, Cirrhosis/ Hepatitis C, hx of rhabdomyolysis/ ARF, Etoh abuse, Coccaine abuse, Nicotine dependence, who c/o sharp substernal chest pain after drinking 1 bottle of vodka today. Notes vomitting up some blood,  Pt is not sure if there were coffee grounds. He can not tell me the color of the blood that he vomitted.  Pt denies dysphagia, abd pain, diarrhea, brbpr, black stool.  Pt denies any aspirin or Nsaid use. Unable to find any record of prior EGD/  colonoscopy in Epic. "  Hospital Course:  Summary of his active problems in the hospital is as following. Chest pain ? esophagitis Troponins are negative.  Echocardiogram unremarkable.  Patient did not have any further chest time of my evaluation. No further work-up indicated for now. UDS was also positive for cocaine which could be the cause for patient's chest pain.  Hematemesis GI was consulted.  Recommending continuous twice a day PPI, I added Carafate. Protonix and octreotide infusions were discontinued.  Hemoglobin remained stable.  No further work-up indicated for now.  Coccaine abuse Pt counselled on cessation  ETOH abuse/ intoxication CIWA protocol  Severe protein calorie malnutrition Start on Prostat 30mg  po bid after not NPO  Anemia chronic disease Hemoglobin stable.  Hyperglycemia  Nicotine dep Pt counselled on smoking cessation x 3 minutes Nicotine patch offerred, but patient declined.   Patient was ambulatory without any assistance. On the day of the discharge the patient's vitals were stable , and no other acute medical condition were reported by patient. the patient was felt safe to be discharge at Home with family.  Consultants: gastroenterology  Procedures: none  DISCHARGE MEDICATION: Allergies as of 03/07/2019      Reactions   Acetaminophen Other (See Comments)   Liver disease       Medication List    TAKE these medications   feeding supplement (ENSURE ENLIVE) Liqd Take 237 mLs by mouth 3 (three) times daily between meals. Notes to patient: Next dose this afternoon between  lunch and dinner    folic acid 1 MG tablet Commonly known as: FOLVITE Take 1 tablet (1 mg total) by mouth daily. Notes to patient: Next dose is tomorrow morning 03/08/19 @ 9 a.m.   lidocaine 2 % solution Commonly known as: XYLOCAINE Use as directed 10 mLs in the mouth or throat 4 (four) times daily -  with meals and at bedtime.   nicotine 21 mg/24hr patch Commonly  known as: NICODERM CQ - dosed in mg/24 hours Place 1 patch (21 mg total) onto the skin daily.   pantoprazole 40 MG tablet Commonly known as: PROTONIX Take 1 tablet (40 mg total) by mouth 2 (two) times daily before a meal. Notes to patient: Next dose is this evening with dinner 03/07/19   sucralfate 1 GM/10ML suspension Commonly known as: CARAFATE Take 10 mLs (1 g total) by mouth 4 (four) times daily -  with meals and at bedtime. Notes to patient: Next dose is this evening with dinner and again at bedtime    thiamine 100 MG tablet Take 1 tablet (100 mg total) by mouth daily. Notes to patient: Next dose is tomorrow morning 03/08/19 @ 9 a.m.      Allergies  Allergen Reactions   Acetaminophen Other (See Comments)    Liver disease    Discharge Instructions    Diet - low sodium heart healthy   Complete by: As directed    Discharge instructions   Complete by: As directed    It is important that you read the given instructions as well as go over your medication list with RN to help you understand your care after this hospitalization.  Discharge Instructions: Please follow-up with PCP in 1-2 weeks  Please request your primary care physician to go over all Hospital Tests and Procedure/Radiological results at the follow up. Please get all Hospital records sent to your PCP by signing hospital release before you go home.   Do not drive, operating heavy machinery, perform activities at heights, swimming or participation in water activities or provide baby sitting services; until you have been seen by Primary Care Physician or a Neurologist and advised to do so again. Do not take more than prescribed Pain, Sleep and Anxiety Medications. You were cared for by a hospitalist during your hospital stay. If you have any questions about your discharge medications or the care you received while you were in the hospital after you are discharged, you can call the unit @UNIT @ you were admitted to and ask  to speak with the hospitalist on call if the hospitalist that took care of you is not available.  Once you are discharged, your primary care physician will handle any further medical issues. Please note that NO REFILLS for any discharge medications will be authorized once you are discharged, as it is imperative that you return to your primary care physician (or establish a relationship with a primary care physician if you do not have one) for your aftercare needs so that they can reassess your need for medications and monitor your lab values. You Must read complete instructions/literature along with all the possible adverse reactions/side effects for all the Medicines you take and that have been prescribed to you. Take any new Medicines after you have completely understood and accept all the possible adverse reactions/side effects. Wear Seat belts while driving. If you have smoked or chewed Tobacco in the last 2 yrs please stop smoking and/or stop any Recreational drug use.  If you drink alcohol, please STOP  the use and do not drive, operating heavy machinery, perform activities at heights, swimming or participation in water activities or provide baby sitting services under influence.   Increase activity slowly   Complete by: As directed      Discharge Exam: Filed Weights   03/05/19 2141 03/06/19 0415  Weight: 54.4 kg 52.1 kg   Vitals:   03/07/19 1100 03/07/19 1208  BP: (!) 129/104   Pulse: 92   Resp: 14   Temp:  98.6 F (37 C)  SpO2: 99%    General: Appear in no distress, no Rash; Oral Mucosa Clear, moist. no Abnormal Mass Or lumps Cardiovascular: S1 and S2 Present, no Murmur, Respiratory: normal respiratory effort, Bilateral Air entry present and Clear to Auscultation, no Crackles, no wheezes Abdomen: Bowel Sound present, Soft and no tenderness, no hernia Extremities: no Pedal edema, no calf tenderness Neurology: alert and oriented to time, place, and person affect appropriate.  normal without focal findings, mental status, speech normal, alert and oriented x3, PERLA, Motor strength 5/5 and symmetric and sensation grossly normal to light touch   The results of significant diagnostics from this hospitalization (including imaging, microbiology, ancillary and laboratory) are listed below for reference.    Significant Diagnostic Studies: Dg Chest 2 View  Result Date: 03/05/2019 CLINICAL DATA:  Chest pain EXAM: CHEST - 2 VIEW COMPARISON:  01/13/2019 FINDINGS: No acute opacity or pleural effusion. Stable cardiomediastinal silhouette with aortic atherosclerosis. No pneumothorax. 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 IMPRESSION: No active cardiopulmonary disease. Electronically Signed   By: Jasmine PangKim  Fujinaga M.D.   On: 03/05/2019 22:32   Ct Angio Chest/abd/pel For Dissection W And/or Wo Contrast  Result Date: 03/06/2019 CLINICAL DATA:  Chest pain EXAM: CT ANGIOGRAPHY CHEST, ABDOMEN AND PELVIS TECHNIQUE: Multidetector CT imaging through the chest, abdomen and pelvis was performed using the standard protocol during bolus administration of intravenous contrast. Multiplanar reconstructed images and MIPs were obtained and reviewed to evaluate the vascular anatomy. CONTRAST:  100mL OMNIPAQUE IOHEXOL 350 MG/ML SOLN COMPARISON:  CT abdomen and pelvis 01/06/2019 FINDINGS: CTA CHEST FINDINGS Cardiovascular: Heart is normal size. Aorta is normal caliber. No evidence of aortic dissection. No filling defects in the pulmonary arteries to suggest pulmonary emboli. Mediastinum/Nodes: No mediastinal, hilar, or axillary adenopathy. Markedly thickened mid to distal esophagus compatible with esophagitis. Lungs/Pleura: Lungs are clear. No focal airspace opacities or suspicious nodules. No effusions. Musculoskeletal: Multiple old left rib fractures involving the 8th through 12th ribs. There is a 2nd fracture through the lateral left 10th rib which could be acute. Review of the MIP images confirms the  above findings. CTA ABDOMEN AND PELVIS FINDINGS VASCULAR Aorta: Normal caliber aorta without aneurysm, dissection, vasculitis or significant stenosis. Mild distal aortic atherosclerosis. Celiac: Patent without evidence of aneurysm, dissection, vasculitis or significant stenosis. SMA: Patent without evidence of aneurysm, dissection, vasculitis or significant stenosis. Renals: Both renal arteries are patent without evidence of aneurysm, dissection, vasculitis, fibromuscular dysplasia or significant stenosis. IMA: Patent without evidence of aneurysm, dissection, vasculitis or significant stenosis. Inflow: Atherosclerosis in the iliofemoral vessels. There is moderate to tight stenosis within the right external iliac artery with greater than 70% narrowing focal dissection noted within the right internal iliac artery. Veins: No obvious venous abnormality within the limitations of this arterial phase study. Review of the MIP images confirms the above findings. NON-VASCULAR Hepatobiliary: No focal hepatic abnormality. Gallbladder unremarkable. Pancreas: No focal abnormality or ductal dilatation. Spleen: No focal abnormality.  Normal size. Adrenals/Urinary Tract: No adrenal abnormality.  No focal renal abnormality. No stones or hydronephrosis. Urinary bladder is unremarkable. Stomach/Bowel: There is wall thickening within the descending colon and proximal sigmoid colon compatible with colitis. Mild circumferential wall thickening within the mid to distal ileum suggesting enteritis. Stomach grossly unremarkable. Lymphatic: No adenopathy Reproductive: No visible focal abnormality. Other: No free fluid or free air. Musculoskeletal: No acute bony abnormality. Review of the MIP images confirms the above findings. IMPRESSION: No evidence of aortic aneurysm or dissection. 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 Focal dissection within the right internal iliac artery, likely not  clinically significant. Distal aortic atherosclerosis. Marked circumferential wall thickening within the mid to distal esophagus compatible with esophagitis. Mild wall thickening within the descending colon and mid to distal ileum compatible with colitis/enteritis. Electronically Signed   By: Charlett NoseKevin  Dover M.D.   On: 03/06/2019 00:40    Microbiology: Recent Results (from the past 240 hour(s))  SARS Coronavirus 2 (CEPHEID - Performed [facility]), Hosp Order     Status: None   Collection Time: 03/06/19  1:09 AM   Specimen: Nasopharyngeal Swab  Result Value Ref Range Status   SARS Coronavirus 2 NEGATIVE NEGATIVE Final    Comment: (NOTE) If result is NEGATIVE SARS-CoV-2 target nucleic acids are NOT DETECTED. The SARS-CoV-2 RNA is generally detectable in upper and lower  respiratory specimens during the acute phase of infection. The lowest  concentration of SARS-CoV-2 viral copies this assay can detect is 250  copies / mL. A negative result does not preclude SARS-CoV-2 infection  and should not be used as the sole basis for treatment or other  patient management decisions.  A negative result may occur with  improper specimen collection / handling, submission of specimen other  than nasopharyngeal swab, presence of viral mutation(s) within the  areas targeted by this assay, and inadequate number of viral copies  (<250 copies / mL). A negative result must be combined with clinical  observations, patient history, and epidemiological information. If result is POSITIVE SARS-CoV-2 target nucleic acids are DETECTED. The SARS-CoV-2 RNA is generally detectable in upper and lower  respiratory specimens dur ing the acute phase of infection.  Positive  results are indicative of active infection with SARS-CoV-2.  Clinical  correlation with patient history and other diagnostic information is  necessary to determine patient infection status.  Positive results do  not rule out bacterial  infection or co-infection with other viruses. If result is PRESUMPTIVE POSTIVE SARS-CoV-2 nucleic acids MAY BE PRESENT.   A presumptive positive result was obtained on the submitted specimen  and confirmed on repeat testing.  While 2019 novel coronavirus  (SARS-CoV-2) nucleic acids may be present in the submitted sample  additional confirmatory testing may be necessary for epidemiological  and / or clinical management purposes  to differentiate between  SARS-CoV-2 and other Sarbecovirus currently known to infect humans.  If clinically indicated additional testing with an alternate test  methodology [phone] is advised. The SARS-CoV-2 RNA is generally  detectable in upper and lower respiratory sp ecimens during the acute  phase of infection. The expected result is Negative. Fact Sheet for Patients:  BoilerBrush.com.cyhttps://www.fda.gov/media/136312/download Fact Sheet for Healthcare Providers: https://pope.com/https://www.fda.gov/media/136313/download This test is not yet approved or cleared by the [place] FDA and has been authorized for detection and/or diagnosis of SARS-CoV-2 by FDA under an Emergency Use Authorization (EUA).  This EUA will remain in effect (meaning this test can be used) for the duration of the  COVID-19 declaration under Section 564(b)(1) of the Act, 21 U.S.C. section 360bbb-3(b)(1), unless the authorization is terminated or revoked sooner. Performed at [facility], [address] [place], [place] [address]   MRSA PCR Screening     Status: None   Collection Time: 03/06/19  3:37 AM   Specimen: Nasal Mucosa; Nasopharyngeal  Result Value Ref Range Status   MRSA by PCR NEGATIVE NEGATIVE Final    Comment:        The GeneXpert MRSA Assay (FDA approved for NASAL specimens only), is one component of a comprehensive MRSA colonization surveillance program. It is not intended to diagnose MRSA infection nor to guide or monitor treatment for MRSA infections. Performed at [facility],  [address], [place], [place] [address]      Labs: CBC: Recent Labs  Lab 03/06/19 0932 03/07/19 0425 03/09/19 1525  WBC 8.6 7.9 13.7*  NEUTROABS  --  3.9  --   HGB 12.0* 12.3* 13.3  HCT 37.7* 40.5 39.6  MCV 87.7 91.4 84.1  PLT 333 265 391   Basic Metabolic Panel: Recent Labs  Lab 03/06/19 0932 03/07/19 0425 03/09/19 1525  NA 134* 133* 129*  K 4.5 4.5 4.1  CL 100 100 95*  CO2 23 21* 22  GLUCOSE 161* 107* 166*  BUN CREATININE 0.85 0.79 1.11  CALCIUM 7.5* 7.9* 8.0*  MG  --  1.9  --    Liver Function Tests: Recent Labs  Lab 03/05/19 2149 03/06/19 0255 03/06/19 0932 03/07/19 0425 03/09/19 1525  AST 20 16 14* 16 16  ALT ALKPHOS 86 78 82 77 78  BILITOT 0.3 0.4 0.3 0.5 0.4  PROT 6.1* 5.4* 5.4* 5.3* 5.8*  ALBUMIN 2.7* 2.3* 2.3* 2.2* 2.4*   Recent Labs  Lab 03/05/19 2149 03/09/19 1525  LIPASE 26 21   No results for input(s): AMMONIA in the last 168 hours. Cardiac Enzymes: Recent Labs  Lab 03/05/19 2149 03/06/19 0126 03/06/19 0932 03/06/19 1456  CKTOTAL  --   --  73  --   CKMB  --   --  7.7*  --   TROPONINI <0.03 <0.03 <0.03 <0.03   BNP (last 3 results) No results for input(s): BNP in the last 8760 hours. CBG: No results for input(s): GLUCAP in the last 168 hours. Time spent: 35 minutes  Signed:  Lynden Oxford  [facility] 03/07/2019

## 2019-03-11 NOTE — Telephone Encounter (Addendum)
Patient d/c'd. Called patient and is aware he is currently scheduled for TCS/EGD/ED with propofol with [facility] 9/22 at 1:45pm. I advised I have him on the cancellation list for sooner appointment if something comes up. I confirmed mailing address in epic is correct. I advised will mail him his prep instructions with his pre-op appoointment in the mail. He request prep Rx be sent to [facility]. Rx sent. Orders entered.   PA for procedure approved via [employer]. Auth# [id number] dates 06/10/2019-07/10/2019.

## 2019-03-11 NOTE — Progress Notes (Signed)
  Subjective: Patient tolerated regular diet well, but then started having epigastric pain.  No nausea or vomiting have been noted.  Objective: Vital signs in last 24 hours: Temp:  [97.6 F (36.4 C)-97.7 F (36.5 C)] 97.7 F (36.5 C) (06/23 0552) Pulse Rate:  [82-91] 91 (06/23 0552) Resp:  [16-17] 16 (06/23 0552) BP: (148-150)/(98-105) 150/99 (06/23 0552) SpO2:  [95 %-100 %] 100 % (06/23 0552) Weight:  [55.4 kg] 55.4 kg (06/23 0500) Last BM Date: 03/10/19  Intake/Output from previous day: 06/22 0701 - 06/23 0700 In: 3170.5 [P.O.:1680; I.V.:1490.5] Out: 500 [Urine:500] Intake/Output this shift: No intake/output data recorded.  General appearance: alert, cooperative and no distress GI: Soft, flat.  No rigidity noted.  Nonspecific tenderness noted in the epigastric region.  Lab Results:  Recent Labs    03/10/19 0535 03/11/19 0509  WBC 8.4 7.1  HGB 11.9* 10.8*  HCT 37.2* 34.9*  PLT 334 327   BMET Recent Labs    03/10/19 0535 03/11/19 0509  NA 131* 132*  K 4.3 4.9  CL 99 100  CO2 24 25  GLUCOSE 74 82  BUN 16 13  CREATININE 0.99 0.75  CALCIUM 7.5* 7.7*   PT/INR No results for input(s): LABPROT, INR in the last 72 hours.  Studies/Results: Dg Abd 2 Views  Result Date: 03/09/2019 CLINICAL DATA:  Abdominal pain EXAM: ABDOMEN - 2 VIEW COMPARISON:  01/14/2019, CT 01/06/2019 FINDINGS: No free air beneath the diaphragm. Multiple loops of dilated small bowel measuring up to 4.8 cm with absence of distal gas. No radiopaque calculi. IMPRESSION: Multiple loops of distended small bowel with absence of distal bowel gas, consistent with small bowel obstruction. Negative for free air. Electronically Signed   By: Donavan Foil M.D.   On: 03/09/2019 17:46    Anti-infectives: Anti-infectives (From admission, onward)   None      Assessment/Plan: Impression: Partial small bowel obstruction resolved.  I think his issues with pain are more related to his erosive esophagitis and  probable gastritis.  Patient has not been compliant in starting his medications to treat this.  I told him he did not need surgery at this point.  He was strongly encouraged to avoid alcohol and illicit drug use.  LOS: 1 day    Aviva Signs 03/11/2019

## 2019-03-11 NOTE — TOC Transition Note (Signed)
Transition of Care [facility] [address]) - CM/SW Discharge Note   Patient Details  Name: [name] MRN: [id number] Date of Birth: Jun 14, 1966  Transition of Care [facility]) CM/SW Contact:  Jshon Ibe, Chauncey Reading, RN Phone Number: 03/11/2019, 12:00 PM   Clinical Narrative:   Patient discharging home today. Reports he can not afford his medications until his disability check arrives. Patient has insurance. Provided Good Rx card and patient is calling wife and family for help with funds.   Discussed at length again his + UDS and how that effects his health and care. Discussed  with patient substance abuse and treatments, finding a new apartment away from his neighbors who trigger his substance abuse issues and applying for Medicaid if possible.       Barriers to Discharge: No Barriers Identified   Patient Goals and CMS Choice Patient states their goals for this hospitalization and ongoing recovery are:: return home in good shape       Discharge Plan and Services   Discharge Planning Services: Medication Assistance, Follow-up appt scheduled                   Readmission Risk Interventions Readmission Risk Prevention Plan 03/11/2019 03/10/2019 03/07/2019  Transportation Screening - Complete Complete  HRI or [place] Work Consult for [place] - - -  Medication Review (RN Care Manager) - Complete Complete  PCP or Specialist appointment within 3-5 days of discharge Complete - Not Complete  PCP/Specialist Appt Not Complete comments - - CSW unable to schedule PCP follow up appointment due to office being closed on Fridays after 12pm. Patient Instructed to call office to schedule follow up appointment.  [place] or Home Care Consult - Not Complete Complete  HRI or Home Care Consult Pt Refusal Comments - patient declines -  SW Recovery Care/Counseling Consult - Complete Complete  Palliative Care Screening - Not Applicable Not  Complete  Skilled Nursing Facility - Not Applicable Not Complete  Some recent data might be hidden

## 2019-03-11 NOTE — Telephone Encounter (Signed)
Patient was re-admitted.

## 2019-03-11 NOTE — Progress Notes (Signed)
IV removed, WNL. Pt was adamant about getting IV pain medicine before he leaves, RN educated him that it wasn't allowed. D/C instructions given to pt. Verbalized understanding. Pt awaiting spouse to transport home.

## 2019-03-11 NOTE — Discharge Summary (Signed)
Physician Discharge Summary  [name] QQP:[id number] DOB: [birth year] DOA: 03/09/2019  PCP: Ryan Linsey, MD  Admit date: 03/09/2019  Discharge date: 03/11/2019  Admitted From:Home  Disposition:  Home  Recommendations for Outpatient Follow-up:  1. Follow up with PCP in 1-2 weeks 2. Patient counseled on pursuing AA to help with alcohol abuse and he seems determined to quit 3. Patient also counseled on reducing the amount of hot sauce use at home due to his esophagitis 4. Patient states that he will pick up his medications of PPI and Carafate "once he has money" which he states will be by 7/3  Home Health: None  Equipment/Devices: None  Discharge Condition: Stable  CODE STATUS: Full  Diet recommendation: Heart Healthy  Brief/Interim Summary: Per HPI: [name] a 53 y.o.malew/ hx of etoh abuse, tobacco use, hep C, drug abuse, depression, cirrhosis, chronic pain presented to ED w/ abd pain and N/V. Was just here w/ similar problems on 6/17. CT at that time showed esophagitis. DC'd w/ rx for PPI/ carafate but pt couldn't afford them and did not get them filled. Comes back w/ abd pain and emesis, LLQ pain. In ED VSS, abd xray suggestive of partial SBO. Asked to see for admission.   Pt is an "alcoholic", drinks different amounts daily depending on "what I can afford". Lives alone, separated from his wife x 6-7 years, has 3 grown children. +tob/ etoh.   ABd pain is worst in the LLQ area. N/V at home , 1-2 x / day. No fevers, no chills, no constipation or diarrhea, no SOB or CP.   Patient was admitted with likely partial small bowel obstruction as well as some GERD/esophagitis.  His diet has been advanced to soft and he appears to be tolerating this well.  He has been having bowel movements and passing flatus and has been seen by general surgery with no acute concern for any need for operative intervention.  He is otherwise stable for discharge and needs to remain on  home PPI and Carafate for his esophagitis which is likely precipitating these episodes of partial small bowel obstruction.  He agrees to purchase his medication soon and reduce his intake of spicy food as well as alcohol.  Discharge Diagnoses:  Principal Problem:   SBO (small bowel obstruction) (HCC) Active Problems:   HEPATITIS C   Dehydration   Hepatitis C   Alcohol abuse   Leukocytosis   Polysubstance abuse (HCC)   Nausea and vomiting in adult patient   History of cirrhosis  Principal discharge diagnosis: Partial small bowel obstruction-resolved.  Discharge Instructions  Discharge Instructions    Diet - low sodium heart healthy   Complete by: As directed    Increase activity slowly   Complete by: As directed      Allergies as of 03/11/2019      Reactions   Acetaminophen Other (See Comments)   Liver disease       Medication List    TAKE these medications   feeding supplement (ENSURE ENLIVE) Liqd Take 237 mLs by mouth 3 (three) times daily between meals.   folic acid 1 MG tablet Commonly known as: FOLVITE Take 1 tablet (1 mg total) by mouth daily.   lidocaine 2 % solution Commonly known as: XYLOCAINE Use as directed 10 mLs in the mouth or throat 4 (four) times daily -  with meals and at bedtime.   nicotine 21 mg/24hr patch Commonly known as: NICODERM CQ - dosed in mg/24 hours Place  1 patch (21 mg total) onto the skin daily.   pantoprazole 40 MG tablet Commonly known as: PROTONIX Take 1 tablet (40 mg total) by mouth 2 (two) times daily before a meal.   sucralfate 1 GM/10ML suspension Commonly known as: CARAFATE Take 10 mLs (1 g total) by mouth 4 (four) times daily -  with meals and at bedtime.   thiamine 100 MG tablet Take 1 tablet (100 mg total) by mouth daily.      Follow-up Information    [name], Richard, MD Follow up in 1 week(s).   Specialty: Internal Medicine Contact information: [address] [place] [address] [phone]           Allergies  Allergen Reactions  . Acetaminophen Other (See Comments)    Liver disease     Consultations:  General surgery   Procedures/Studies: Dg Chest 2 View  Result Date: 03/05/2019 CLINICAL DATA:  Chest pain EXAM: CHEST - 2 VIEW COMPARISON:  01/13/2019 FINDINGS: No acute opacity or pleural effusion. Stable cardiomediastinal silhouette with aortic atherosclerosis. No pneumothorax. 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 IMPRESSION: No active cardiopulmonary disease. Electronically Signed   By: Jasmine PangKim  Fujinaga M.D.   On: 03/05/2019 22:32   Dg Abd 2 Views  Result Date: 03/09/2019 CLINICAL DATA:  Abdominal pain EXAM: ABDOMEN - 2 VIEW COMPARISON:  01/14/2019, CT 01/06/2019 FINDINGS: No free air beneath the diaphragm. Multiple loops of dilated small bowel measuring up to 4.8 cm with absence of distal gas. No radiopaque calculi. IMPRESSION: Multiple loops of distended small bowel with absence of distal bowel gas, consistent with small bowel obstruction. Negative for free air. Electronically Signed   By: Jasmine PangKim  Fujinaga M.D.   On: 03/09/2019 17:46   Ct Angio Chest/abd/pel For Dissection W And/or Wo Contrast  Result Date: 03/06/2019 CLINICAL DATA:  Chest pain EXAM: CT ANGIOGRAPHY CHEST, ABDOMEN AND PELVIS TECHNIQUE: Multidetector CT imaging through the chest, abdomen and pelvis was performed using the standard protocol during bolus administration of intravenous contrast. Multiplanar reconstructed images and MIPs were obtained and reviewed to evaluate the vascular anatomy. CONTRAST:  100mL OMNIPAQUE IOHEXOL 350 MG/ML SOLN COMPARISON:  CT abdomen and pelvis 01/06/2019 FINDINGS: CTA CHEST FINDINGS Cardiovascular: Heart is normal size. Aorta is normal caliber. No evidence of aortic dissection. No filling defects in the pulmonary arteries to suggest pulmonary emboli. Mediastinum/Nodes: No mediastinal, hilar, or axillary adenopathy. Markedly thickened mid to distal esophagus compatible with  esophagitis. Lungs/Pleura: Lungs are clear. No focal airspace opacities or suspicious nodules. No effusions. Musculoskeletal: Multiple old left rib fractures involving the 8th through 12th ribs. There is a 2nd fracture through the lateral left 10th rib which could be acute. Review of the MIP images confirms the above findings. CTA ABDOMEN AND PELVIS FINDINGS VASCULAR Aorta: Normal caliber aorta without aneurysm, dissection, vasculitis or significant stenosis. Mild distal aortic atherosclerosis. Celiac: Patent without evidence of aneurysm, dissection, vasculitis or significant stenosis. SMA: Patent without evidence of aneurysm, dissection, vasculitis or significant stenosis. Renals: Both renal arteries are patent without evidence of aneurysm, dissection, vasculitis, fibromuscular dysplasia or significant stenosis. IMA: Patent without evidence of aneurysm, dissection, vasculitis or significant stenosis. Inflow: Atherosclerosis in the iliofemoral vessels. There is moderate to tight stenosis within the right external iliac artery with greater than 70% narrowing focal dissection noted within the right internal iliac artery. Veins: No obvious venous abnormality within the limitations of this arterial phase study. Review of the MIP images confirms the above findings. NON-VASCULAR Hepatobiliary: No focal hepatic abnormality.  Gallbladder unremarkable. Pancreas: No focal abnormality or ductal dilatation. Spleen: No focal abnormality.  Normal size. Adrenals/Urinary Tract: No adrenal abnormality. No focal renal abnormality. No stones or hydronephrosis. Urinary bladder is unremarkable. Stomach/Bowel: There is wall thickening within the descending colon and proximal sigmoid colon compatible with colitis. Mild circumferential wall thickening within the mid to distal ileum suggesting enteritis. Stomach grossly unremarkable. Lymphatic: No adenopathy Reproductive: No visible focal abnormality. Other: No free fluid or free air.  Musculoskeletal: No acute bony abnormality. Review of the MIP images confirms the above findings. IMPRESSION: No evidence of aortic aneurysm or dissection. 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 Distal aortic atherosclerosis. Marked circumferential wall thickening within the mid to distal esophagus compatible with esophagitis. Mild wall thickening within the descending colon and mid to distal ileum compatible with colitis/enteritis. Electronically Signed   By: Charlett NoseKevin  Dover M.D.   On: 03/06/2019 00:40    Discharge Exam: Vitals:   03/10/19 2139 03/11/19 0552  BP: (!) 148/105 (!) 150/99  Pulse: 91 91  Resp: 16 16  Temp: 97.7 F (36.5 C) 97.7 F (36.5 C)  SpO2: 100% 100%   Vitals:   03/10/19 2020 03/10/19 2139 03/11/19 0500 03/11/19 0552  BP:  (!) 148/105  (!) 150/99  Pulse:  91  91  Resp:  16  16  Temp:  97.7 F (36.5 C)  97.7 F (36.5 C)  TempSrc:  Oral  Oral  SpO2: 95% 100%  100%  Weight:   55.4 kg   Height:        General: Pt is alert, awake, not in acute distress Cardiovascular: RRR, S1/S2 +, no rubs, no gallops Respiratory: CTA bilaterally, no wheezing, no rhonchi Abdominal: Soft, NT, ND, bowel sounds + Extremities: no edema, no cyanosis    The results of significant diagnostics from this hospitalization (including imaging, microbiology, ancillary and laboratory) are listed below for reference.     Microbiology: Recent Results (from the past 240 hour(s))  SARS Coronavirus 2 (CEPHEID - Performed in [facility] hospital lab), Hosp Order     Status: None   Collection Time: 03/06/19  1:09 AM   Specimen: Nasopharyngeal Swab  Result Value Ref Range Status   SARS Coronavirus 2 NEGATIVE NEGATIVE Final    Comment: (NOTE) If result is NEGATIVE SARS-CoV-2 target nucleic acids are NOT DETECTED. The SARS-CoV-2 RNA is generally detectable  in upper and lower  respiratory specimens during the acute phase of infection. The lowest  concentration of SARS-CoV-2 viral copies this assay can detect is 250  copies / mL. A negative result does not preclude SARS-CoV-2 infection  and should not be used as the sole basis for treatment or other  patient management decisions.  A negative result may occur with  improper specimen collection / handling, submission of specimen other  than nasopharyngeal swab, presence of viral mutation(s) within the  areas targeted by this assay, and inadequate number of viral copies  (<250 copies / mL). A negative result must be combined with clinical  observations, patient history, and epidemiological information. If result is POSITIVE SARS-CoV-2 target nucleic acids are DETECTED. The SARS-CoV-2 RNA is generally detectable in upper and lower  respiratory specimens dur ing the acute phase of infection.  Positive  results are indicative of active infection with SARS-CoV-2.  Clinical  correlation with patient history and other diagnostic information is  necessary to determine patient infection  status.  Positive results do  not rule out bacterial infection or co-infection with other viruses. If result is PRESUMPTIVE POSTIVE SARS-CoV-2 nucleic acids MAY BE PRESENT.   A presumptive positive result was obtained on the submitted specimen  and confirmed on repeat testing.  While 2019 novel coronavirus  (SARS-CoV-2) nucleic acids may be present in the submitted sample  additional confirmatory testing may be necessary for epidemiological  and / or clinical management purposes  to differentiate between  SARS-CoV-2 and other Sarbecovirus currently known to infect humans.  If clinically indicated additional testing with an alternate test  methodology [phone]) is advised. The SARS-CoV-2 RNA is generally  detectable in upper and lower respiratory sp ecimens during the acute  phase of infection. The expected result is  Negative. Fact Sheet for Patients:  BoilerBrush.com.cy Fact Sheet for Healthcare Providers: https://pope.com/ This test is not yet approved or cleared by the [place] FDA and has been authorized for detection and/or diagnosis of SARS-CoV-2 by FDA under an Emergency Use Authorization (EUA).  This EUA will remain in effect (meaning this test can be used) for the duration of the COVID-19 declaration under Section 564(b)(1) of the Act, 21 U.S.C. section 360bbb-3(b)(1), unless the authorization is terminated or revoked sooner. Performed at [facility], [address] [place], [place] [address]   MRSA PCR Screening     Status: None   Collection Time: 03/06/19  3:37 AM   Specimen: Nasal Mucosa; Nasopharyngeal  Result Value Ref Range Status   MRSA by PCR NEGATIVE NEGATIVE Final    Comment:        The GeneXpert MRSA Assay (FDA approved for NASAL specimens only), is one component of a comprehensive MRSA colonization surveillance program. It is not intended to diagnose MRSA infection nor to guide or monitor treatment for MRSA infections. Performed at [facility], [address], [place], [place] [address]   SARS Coronavirus 2 (CEPHEID - Performed in [facility] hospital lab), Hosp Order     Status: None   Collection Time: 03/09/19  6:28 PM   Specimen: Nasopharyngeal Swab  Result Value Ref Range Status   SARS Coronavirus 2 NEGATIVE NEGATIVE Final    Comment: (NOTE) If result is NEGATIVE SARS-CoV-2 target nucleic acids are NOT DETECTED. The SARS-CoV-2 RNA is generally detectable in upper and lower  respiratory specimens during the acute phase of infection. The lowest  concentration of SARS-CoV-2 viral copies this assay can detect is 250  copies / mL. A negative result does not preclude SARS-CoV-2 infection  and should not be used as the sole basis for treatment or other  patient management decisions.  A negative result may occur with   improper specimen collection / handling, submission of specimen other  than nasopharyngeal swab, presence of viral mutation(s) within the  areas targeted by this assay, and inadequate number of viral copies  (<250 copies / mL). A negative result must be combined with clinical  observations, patient history, and epidemiological information. If result is POSITIVE SARS-CoV-2 target nucleic acids are DETECTED. The SARS-CoV-2 RNA is generally detectable in upper and lower  respiratory specimens dur ing the acute phase of infection.  Positive  results are indicative of active infection with SARS-CoV-2.  Clinical  correlation with patient history and other diagnostic information is  necessary to determine patient infection status.  Positive results do  not rule out bacterial infection or co-infection with other viruses. If result is PRESUMPTIVE POSTIVE SARS-CoV-2 nucleic acids MAY BE PRESENT.   A presumptive  positive result was obtained on the submitted specimen  and confirmed on repeat testing.  While 2019 novel coronavirus  (SARS-CoV-2) nucleic acids may be present in the submitted sample  additional confirmatory testing may be necessary for epidemiological  and / or clinical management purposes  to differentiate between  SARS-CoV-2 and other Sarbecovirus currently known to infect humans.  If clinically indicated additional testing with an alternate test  methodology [phone]) is advised. The SARS-CoV-2 RNA is generally  detectable in upper and lower respiratory sp ecimens during the acute  phase of infection. The expected result is Negative. Fact Sheet for Patients:  StrictlyIdeas.no Fact Sheet for Healthcare Providers: BankingDealers.co.za This test is not yet approved or cleared by the [place] FDA and has been authorized for detection and/or diagnosis of SARS-CoV-2 by FDA under an Emergency Use Authorization (EUA).  This EUA will  remain in effect (meaning this test can be used) for the duration of the COVID-19 declaration under Section 564(b)(1) of the Act, 21 U.S.C. section 360bbb-3(b)(1), unless the authorization is terminated or revoked sooner. Performed at [facility], [address], [place], [place] [address]      Labs: BNP (last 3 results) No results for input(s): BNP in the last 8760 hours. Basic Metabolic Panel: Recent Labs  Lab 03/06/19 0932 03/07/19 0425 03/09/19 1525 03/10/19 0535 03/11/19 0509  NA 134* 133* 129* 131* 132*  K 4.5 4.5 4.1 4.3 4.9  CL 100 100 95* 99 100  CO2 23 21* 22 24 25   GLUCOSE 161* 107* 166* 74 82  BUN 7 7 17 16 13   CREATININE 0.85 0.79 1.11 0.99 0.75  CALCIUM 7.5* 7.9* 8.0* 7.5* 7.7*  MG  --  1.9  --  1.8 1.8   Liver Function Tests: Recent Labs  Lab 03/05/19 2149 03/06/19 0255 03/06/19 0932 03/07/19 0425 03/09/19 1525  AST 20 16 14* 16 16  ALT 15 14 12 12 14   ALKPHOS 86 78 82 77 78  BILITOT 0.3 0.4 0.3 0.5 0.4  PROT 6.1* 5.4* 5.4* 5.3* 5.8*  ALBUMIN 2.7* 2.3* 2.3* 2.2* 2.4*   Recent Labs  Lab 03/05/19 2149 03/09/19 1525  LIPASE 26 21   No results for input(s): AMMONIA in the last 168 hours. CBC: Recent Labs  Lab 03/06/19 0932 03/07/19 0425 03/09/19 1525 03/10/19 0535 03/11/19 0509  WBC 8.6 7.9 13.7* 8.4 7.1  NEUTROABS  --  3.9  --   --   --   HGB 12.0* 12.3* 13.3 11.9* 10.8*  HCT 37.7* 40.5 39.6 37.2* 34.9*  MCV 87.7 91.4 84.1 87.5 88.6  PLT 333 265 391 334 327   Cardiac Enzymes: Recent Labs  Lab 03/05/19 2149 03/06/19 0126 03/06/19 0932 03/06/19 1456  CKTOTAL  --   --  73  --   CKMB  --   --  7.7*  --   TROPONINI <0.03 <0.03 <0.03 <0.03   BNP: Invalid input(s): POCBNP CBG: No results for input(s): GLUCAP in the last 168 hours. D-Dimer No results for input(s): DDIMER in the last 72 hours. Hgb A1c No results for input(s): HGBA1C in the last 72 hours. Lipid Profile No results for input(s): CHOL, HDL, LDLCALC, TRIG, CHOLHDL,  LDLDIRECT in the last 72 hours. Thyroid function studies No results for input(s): TSH, T4TOTAL, T3FREE, THYROIDAB in the last 72 hours.  Invalid input(s): FREET3 Anemia work up No results for input(s): VITAMINB12, FOLATE, FERRITIN, TIBC, IRON, RETICCTPCT in the last 72 hours. Urinalysis    Component Value Date/Time  COLORURINE YELLOW 03/09/2019 1828   APPEARANCEUR HAZY (A) 03/09/2019 1828   LABSPEC 1.013 03/09/2019 1828   PHURINE 5.0 03/09/2019 1828   GLUCOSEU NEGATIVE 03/09/2019 1828   HGBUR NEGATIVE 03/09/2019 1828   BILIRUBINUR NEGATIVE 03/09/2019 1828   KETONESUR NEGATIVE 03/09/2019 1828   PROTEINUR NEGATIVE 03/09/2019 1828   UROBILINOGEN 0.2 07/24/2014 1725   NITRITE NEGATIVE 03/09/2019 1828   LEUKOCYTESUR NEGATIVE 03/09/2019 1828   Sepsis Labs Invalid input(s): PROCALCITONIN,  WBC,  LACTICIDVEN Microbiology Recent Results (from the past 240 hour(s))  SARS Coronavirus 2 (CEPHEID - Performed in [facility] hospital lab), Hosp Order     Status: None   Collection Time: 03/06/19  1:09 AM   Specimen: Nasopharyngeal Swab  Result Value Ref Range Status   SARS Coronavirus 2 NEGATIVE NEGATIVE Final    Comment: (NOTE) If result is NEGATIVE SARS-CoV-2 target nucleic acids are NOT DETECTED. The SARS-CoV-2 RNA is generally detectable in upper and lower  respiratory specimens during the acute phase of infection. The lowest  concentration of SARS-CoV-2 viral copies this assay can detect is 250  copies / mL. A negative result does not preclude SARS-CoV-2 infection  and should not be used as the sole basis for treatment or other  patient management decisions.  A negative result may occur with  improper specimen collection / handling, submission of specimen other  than nasopharyngeal swab, presence of viral mutation(s) within the  areas targeted by this assay, and inadequate number of viral copies  (<250 copies / mL). A negative result must be combined with clinical  observations,  patient history, and epidemiological information. If result is POSITIVE SARS-CoV-2 target nucleic acids are DETECTED. The SARS-CoV-2 RNA is generally detectable in upper and lower  respiratory specimens dur ing the acute phase of infection.  Positive  results are indicative of active infection with SARS-CoV-2.  Clinical  correlation with patient history and other diagnostic information is  necessary to determine patient infection status.  Positive results do  not rule out bacterial infection or co-infection with other viruses. If result is PRESUMPTIVE POSTIVE SARS-CoV-2 nucleic acids MAY BE PRESENT.   A presumptive positive result was obtained on the submitted specimen  and confirmed on repeat testing.  While 2019 novel coronavirus  (SARS-CoV-2) nucleic acids may be present in the submitted sample  additional confirmatory testing may be necessary for epidemiological  and / or clinical management purposes  to differentiate between  SARS-CoV-2 and other Sarbecovirus currently known to infect humans.  If clinically indicated additional testing with an alternate test  methodology [phone] is advised. The SARS-CoV-2 RNA is generally  detectable in upper and lower respiratory sp ecimens during the acute  phase of infection. The expected result is Negative. Fact Sheet for Patients:  BoilerBrush.com.cyhttps://www.fda.gov/media/136312/download Fact Sheet for Healthcare Providers: https://pope.com/https://www.fda.gov/media/136313/download This test is not yet approved or cleared by the [place] FDA and has been authorized for detection and/or diagnosis of SARS-CoV-2 by FDA under an Emergency Use Authorization (EUA).  This EUA will remain in effect (meaning this test can be used) for the duration of the COVID-19 declaration under Section 564(b)(1) of the Act, 21 U.S.C. section 360bbb-3(b)(1), unless the authorization is terminated or revoked sooner. Performed at [facility], [address] [place], [place] [address]    MRSA PCR Screening     Status: None   Collection Time: 03/06/19  3:37 AM   Specimen: Nasal Mucosa; Nasopharyngeal  Result Value Ref Range Status   MRSA by PCR NEGATIVE NEGATIVE Final  Comment:        The GeneXpert MRSA Assay (FDA approved for NASAL specimens only), is one component of a comprehensive MRSA colonization surveillance program. It is not intended to diagnose MRSA infection nor to guide or monitor treatment for MRSA infections. Performed at [facility], [address], [place], [place] [address]   SARS Coronavirus 2 (CEPHEID - Performed in [facility] hospital lab), Hosp Order     Status: None   Collection Time: 03/09/19  6:28 PM   Specimen: Nasopharyngeal Swab  Result Value Ref Range Status   SARS Coronavirus 2 NEGATIVE NEGATIVE Final    Comment: (NOTE) If result is NEGATIVE SARS-CoV-2 target nucleic acids are NOT DETECTED. The SARS-CoV-2 RNA is generally detectable in upper and lower  respiratory specimens during the acute phase of infection. The lowest  concentration of SARS-CoV-2 viral copies this assay can detect is 250  copies / mL. A negative result does not preclude SARS-CoV-2 infection  and should not be used as the sole basis for treatment or other  patient management decisions.  A negative result may occur with  improper specimen collection / handling, submission of specimen other  than nasopharyngeal swab, presence of viral mutation(s) within the  areas targeted by this assay, and inadequate number of viral copies  (<250 copies / mL). A negative result must be combined with clinical  observations, patient history, and epidemiological information. If result is POSITIVE SARS-CoV-2 target nucleic acids are DETECTED. The SARS-CoV-2 RNA is generally detectable in upper and lower  respiratory specimens dur ing the acute phase of infection.  Positive  results are indicative of active infection with SARS-CoV-2.  Clinical  correlation with patient history  and other diagnostic information is  necessary to determine patient infection status.  Positive results do  not rule out bacterial infection or co-infection with other viruses. If result is PRESUMPTIVE POSTIVE SARS-CoV-2 nucleic acids MAY BE PRESENT.   A presumptive positive result was obtained on the submitted specimen  and confirmed on repeat testing.  While 2019 novel coronavirus  (SARS-CoV-2) nucleic acids may be present in the submitted sample  additional confirmatory testing may be necessary for epidemiological  and / or clinical management purposes  to differentiate between  SARS-CoV-2 and other Sarbecovirus currently known to infect humans.  If clinically indicated additional testing with an alternate test  methodology [phone] is advised. The SARS-CoV-2 RNA is generally  detectable in upper and lower respiratory sp ecimens during the acute  phase of infection. The expected result is Negative. Fact Sheet for Patients:  BoilerBrush.com.cy Fact Sheet for Healthcare Providers: https://pope.com/ This test is not yet approved or cleared by the [place] FDA and has been authorized for detection and/or diagnosis of SARS-CoV-2 by FDA under an Emergency Use Authorization (EUA).  This EUA will remain in effect (meaning this test can be used) for the duration of the COVID-19 declaration under Section 564(b)(1) of the Act, 21 U.S.C. section 360bbb-3(b)(1), unless the authorization is terminated or revoked sooner. Performed at [facility], [address] [place], [place] [address]      Time coordinating discharge: 35 minutes  SIGNED:   Erick Blinks, DO [facility] 03/11/2019, 10:22 AM  If 7PM-7AM, please contact night-coverage [email or web address] Password [email or web address]

## 2019-03-12 NOTE — Telephone Encounter (Addendum)
Spoke with patient. Procedure has been moved up to 7/27 at 9:45am. Patient aware will mail new instructions with new pre-op appt. He asked for me to speak with spouse Vaughan Basta as well to make aware. Pt handed phone to spouse. I made [facility] aware of new procedure details. She is aware to discard previous instructions coming in the mail as I am mailing new ones. She voiced understanding.  Called endo and LMOVM making aware of appt change.  Contacted [employer] to change date for PA. Will await final approval

## 2019-03-13 NOTE — Telephone Encounter (Signed)
PA has been updated to reflect change. Dates now valid from 7/27-8/26. [id number]

## 2019-03-13 NOTE — Telephone Encounter (Signed)
Noted. Reviewed discharge medications. No special instructions needed regarding meds.

## 2019-03-13 NOTE — Telephone Encounter (Signed)
Called patient and he is aware of pre-op appt

## 2019-04-09 NOTE — Patient Instructions (Signed)
[name]  04/09/2019     @PREFPERIOPPHARMACY @   Your procedure is scheduled on  04/14/2019 .  Report to [facility] at  815   A.M.  Call this number if you have problems the morning of surgery:  [phone]   Remember:  Follow the diet and prep instructions given to you by Dr Oneida Alar office.                   Take these medicines the morning of surgery with A SIP OF WATER Prilosec    Do not wear jewelry, make-up or nail polish.  Do not wear lotions, powders, or perfumes, or deodorant.  Do not shave 48 hours prior to surgery.  Men may shave face and neck.  Do not bring valuables to the hospital.  [facility] is not responsible for any belongings or valuables.  Contacts, dentures or bridgework may not be worn into surgery.  Leave your suitcase in the car.  After surgery it may be brought to your room.  For patients admitted to the hospital, discharge time will be determined by your treatment team.  Patients discharged the day of surgery will not be allowed to drive home.   Name and phone number of your driver:   family Special instructions:  None  Please read over the following fact sheets that you were given. Anesthesia Post-op Instructions and Care and Recovery After Surgery       Upper Endoscopy, Adult, Care After This sheet gives you information about how to care for yourself after your procedure. Your health care provider may also give you more specific instructions. If you have problems or questions, contact your health care provider. What can I expect after the procedure? After the procedure, it is common to have:  A sore throat.  Mild stomach pain or discomfort.  Bloating.  Nausea. Follow these instructions at home:   Follow instructions from your health care provider about what to eat or drink after your procedure.  Return to your normal activities as told by your health care provider. Ask your health care provider what activities are  safe for you.  Take over-the-counter and prescription medicines only as told by your health care provider.  Do not drive for 24 hours if you were given a sedative during your procedure.  Keep all follow-up visits as told by your health care provider. This is important. Contact a health care provider if you have:  A sore throat that lasts longer than one day.  Trouble swallowing. Get help right away if:  You vomit blood or your vomit looks like coffee grounds.  You have: ? A fever. ? Bloody, black, or tarry stools. ? A severe sore throat or you cannot swallow. ? Difficulty breathing. ? Severe pain in your chest or abdomen. Summary  After the procedure, it is common to have a sore throat, mild stomach discomfort, bloating, and nausea.  Do not drive for 24 hours if you were given a sedative during the procedure.  Follow instructions from your health care provider about what to eat or drink after your procedure.  Return to your normal activities as told by your health care provider. This information is not intended to replace advice given to you by your health care provider. Make sure you discuss any questions you have with your health care provider. Document Released: 03/05/2012 Document Revised: 02/26/2018 Document Reviewed: 02/04/2018 Elsevier Patient Education  2020  Elsevier Inc.  Esophageal Dilatation Esophageal dilatation, also called esophageal dilation, is a procedure to widen or open (dilate) a blocked or narrowed part of the esophagus. The esophagus is the part of the body that moves food and liquid from the mouth to the stomach. You may need this procedure if:  You have a buildup of scar tissue in your esophagus that makes it difficult, painful, or impossible to swallow. This can be caused by gastroesophageal reflux disease (GERD).  You have cancer of the esophagus.  There is a problem with how food moves through your esophagus. In some cases, you may need this  procedure repeated at a later time to dilate the esophagus gradually. Tell a health care provider about:  Any allergies you have.  All medicines you are taking, including vitamins, herbs, eye drops, creams, and over-the-counter medicines.  Any problems you or family members have had with anesthetic medicines.  Any blood disorders you have.  Any surgeries you have had.  Any medical conditions you have.  Any antibiotic medicines you are required to take before dental procedures.  Whether you are pregnant or may be pregnant. What are the risks? Generally, this is a safe procedure. However, problems may occur, including:  Bleeding due to a tear in the lining of the esophagus.  A hole (perforation) in the esophagus. What happens before the procedure?  Follow instructions from your health care provider about eating or drinking restrictions.  Ask your health care provider about changing or stopping your regular medicines. This is especially important if you are taking diabetes medicines or blood thinners.  Plan to have someone take you home from the hospital or clinic.  Plan to have a responsible adult care for you for at least 24 hours after you leave the hospital or clinic. This is important. What happens during the procedure?  You may be given a medicine to help you relax (sedative).  A numbing medicine may be sprayed into the back of your throat, or you may gargle the medicine.  Your health care provider may perform the dilatation using various surgical instruments, such as: ? Simple dilators. This instrument is carefully placed in the esophagus to stretch it. ? Guided wire bougies. This involves using an endoscope to insert a wire into the esophagus. A dilator is passed over this wire to enlarge the esophagus. Then the wire is removed. ? Balloon dilators. An endoscope with a small balloon at the end is inserted into the esophagus. The balloon is inflated to stretch the  esophagus and open it up. The procedure may vary among health care providers and hospitals. What happens after the procedure?  Your blood pressure, heart rate, breathing rate, and blood oxygen level will be monitored until the medicines you were given have worn off.  Your throat may feel slightly sore and numb. This will improve slowly over time.  You will not be allowed to eat or drink until your throat is no longer numb.  When you are able to drink, urinate, and sit on the edge of the bed without nausea or dizziness, you may be able to return home. Follow these instructions at home:  Take over-the-counter and prescription medicines only as told by your health care provider.  Do not drive for 24 hours if you were given a sedative during your procedure.  You should have a responsible adult with you for 24 hours after the procedure.  Follow instructions from your health care provider about any eating or drinking  restrictions.  Do not use any products that contain nicotine or tobacco, such as cigarettes and e-cigarettes. If you need help quitting, ask your health care provider.  Keep all follow-up visits as told by your health care provider. This is important. Get help right away if you:  Have a fever.  Have chest pain.  Have pain that is not relieved by medication.  Have trouble breathing.  Have trouble swallowing.  Vomit blood. Summary  Esophageal dilatation, also called esophageal dilation, is a procedure to widen or open (dilate) a blocked or narrowed part of the esophagus.  Plan to have someone take you home from the hospital or clinic.  For this procedure, a numbing medicine may be sprayed into the back of your throat, or you may gargle the medicine.  Do not drive for 24 hours if you were given a sedative during your procedure. This information is not intended to replace advice given to you by your health care provider. Make sure you discuss any questions you have  with your health care provider. Document Released: 10/26/2005 Document Revised: 08/17/2017 Document Reviewed: 07/10/2017 Elsevier Patient Education  2020 [employer]  Colonoscopy, Adult, Care After This sheet gives you information about how to care for yourself after your procedure. Your health care provider may also give you more specific instructions. If you have problems or questions, contact your health care provider. What can I expect after the procedure? After the procedure, it is common to have:  A small amount of blood in your stool for 24 hours after the procedure.  Some gas.  Mild abdominal cramping or bloating. Follow these instructions at home: General instructions  For the first 24 hours after the procedure: ? Do not drive or use machinery. ? Do not sign important documents. ? Do not drink alcohol. ? Do your regular daily activities at a slower pace than normal. ? Eat soft, easy-to-digest foods.  Take over-the-counter or prescription medicines only as told by your health care provider. Relieving cramping and bloating   Try walking around when you have cramps or feel bloated.  Apply heat to your abdomen as told by your health care provider. Use a heat source that your health care provider recommends, such as a moist heat pack or a heating pad. ? Place a towel between your skin and the heat source. ? Leave the heat on for 20-30 minutes. ? Remove the heat if your skin turns bright red. This is especially important if you are unable to feel pain, heat, or cold. You may have a greater risk of getting burned. Eating and drinking   Drink enough fluid to keep your urine pale yellow.  Resume your normal diet as instructed by your health care provider. Avoid heavy or fried foods that are hard to digest.  Avoid drinking alcohol for as long as instructed by your health care provider. Contact a health care provider if:  You have blood in your stool 2-3 days after the  procedure. Get help right away if:  You have more than a small spotting of blood in your stool.  You pass large blood clots in your stool.  Your abdomen is swollen.  You have nausea or vomiting.  You have a fever.  You have increasing abdominal pain that is not relieved with medicine. Summary  After the procedure, it is common to have a small amount of blood in your stool. You may also have mild abdominal cramping and bloating.  For the first 24 hours  after the procedure, do not drive or use machinery, sign important documents, or drink alcohol.  Contact your health care provider if you have a lot of blood in your stool, nausea or vomiting, a fever, or increased abdominal pain. This information is not intended to replace advice given to you by your health care provider. Make sure you discuss any questions you have with your health care provider. Document Released: 04/18/2004 Document Revised: 06/27/2017 Document Reviewed: 11/16/2015 Elsevier Patient Education  2020 Elsevier Inc. Monitored Anesthesia Care, Care After These instructions provide you with information about caring for yourself after your procedure. Your health care provider may also give you more specific instructions. Your treatment has been planned according to current medical practices, but problems sometimes occur. Call your health care provider if you have any problems or questions after your procedure. What can I expect after the procedure? After your procedure, you may:  Feel sleepy for several hours.  Feel clumsy and have poor balance for several hours.  Feel forgetful about what happened after the procedure.  Have poor judgment for several hours.  Feel nauseous or vomit.  Have a sore throat if you had a breathing tube during the procedure. Follow these instructions at home: For at least 24 hours after the procedure:      Have a responsible adult stay with you. It is important to have someone help  care for you until you are awake and alert.  Rest as needed.  Do not: ? Participate in activities in which you could fall or become injured. ? Drive. ? Use heavy machinery. ? Drink alcohol. ? Take sleeping pills or medicines that cause drowsiness. ? Make important decisions or sign legal documents. ? Take care of children on your own. Eating and drinking  Follow the diet that is recommended by your health care provider.  If you vomit, drink water, juice, or soup when you can drink without vomiting.  Make sure you have little or no nausea before eating solid foods. General instructions  Take over-the-counter and prescription medicines only as told by your health care provider.  If you have sleep apnea, surgery and certain medicines can increase your risk for breathing problems. Follow instructions from your health care provider about wearing your sleep device: ? Anytime you are sleeping, including during daytime naps. ? While taking prescription pain medicines, sleeping medicines, or medicines that make you drowsy.  If you smoke, do not smoke without supervision.  Keep all follow-up visits as told by your health care provider. This is important. Contact a health care provider if:  You keep feeling nauseous or you keep vomiting.  You feel light-headed.  You develop a rash.  You have a fever. Get help right away if:  You have trouble breathing. Summary  For several hours after your procedure, you may feel sleepy and have poor judgment.  Have a responsible adult stay with you for at least 24 hours or until you are awake and alert. This information is not intended to replace advice given to you by your health care provider. Make sure you discuss any questions you have with your health care provider. Document Released: 12/26/2015 Document Revised: 12/03/2017 Document Reviewed: 12/26/2015 Elsevier Patient Education  2020 [employer]

## 2019-04-10 ENCOUNTER — Encounter (HOSPITAL_COMMUNITY)
Admission: RE | Admit: 2019-04-10 | Discharge: 2019-04-10 | Disposition: A | Discharge status: Home / Self Care | Payer: Medicare PPO | Source: Ambulatory Visit | Attending: Gastroenterology | Admitting: Gastroenterology

## 2019-04-10 ENCOUNTER — Other Ambulatory Visit: Payer: Self-pay

## 2019-04-10 ENCOUNTER — Telehealth: Payer: Self-pay | Admitting: *Deleted

## 2019-04-10 ENCOUNTER — Other Ambulatory Visit (HOSPITAL_COMMUNITY)
Admission: RE | Admit: 2019-04-10 | Discharge: 2019-04-10 | Disposition: A | Discharge status: Home / Self Care | Payer: Medicare PPO | Source: Ambulatory Visit | Attending: Gastroenterology | Admitting: Gastroenterology

## 2019-04-10 MED ORDER — PEG 3350-KCL-NA BICARB-NACL 420 G PO SOLR: 4000.0000 mL | Freq: Once | ORAL | 0 refills | Status: AC

## 2019-04-10 NOTE — Telephone Encounter (Signed)
-----   Message from Encarnacion Chu, RN sent at 04/10/2019  2:16 PM EDT ----- Regarding: No show Hey [name]! Mr Heggs did not show for his PAT today.

## 2019-04-10 NOTE — Telephone Encounter (Signed)
Called patient and received message he had calling restrictions. Called alternate # and it is for patient brother. He gave me # to reach patient at [phone]. Called patient. He states he misplaced his papers as he has moved and forgot about the appt. I advised patient he needs to call endo now to r/s his appt or procedure will be cancelled. Patient provided with endo #. He will call and r/s. Patient also did not go for his COVID-19 testing.

## 2019-04-10 NOTE — Telephone Encounter (Signed)
Patient called back. He stated he does not have transportation this week and so he is unable to go for PRE-OP or COVID-19 testing. I advised patient that his procedure will have to be rescheduled. He has been rescheduled to next available 10/19 at 12:45pm. Patient aware I will mail new instructions with new pre-op appt to him (confirmed address). He needs new Rx sent to pharmacy as well. FYI to [facility].

## 2019-04-11 NOTE — Telephone Encounter (Signed)
REVIEWED-NO ADDITIONAL RECOMMENDATIONS. 

## 2019-05-07 ENCOUNTER — Ambulatory Visit: Payer: Medicare PPO | Admitting: Gastroenterology

## 2019-05-07 ENCOUNTER — Encounter: Payer: Self-pay | Admitting: Gastroenterology

## 2019-05-07 ENCOUNTER — Telehealth: Payer: Self-pay | Admitting: Gastroenterology

## 2019-05-07 NOTE — Telephone Encounter (Signed)
Patient was a no show and letter sent  °

## 2019-05-07 NOTE — Progress Notes (Deleted)
Primary Care Physician:  Lucia Gaskins, MD  Primary Gastroenterologist:  Barney Drain, MD   No chief complaint on file.   HPI:  [name] is a 53 y.o. male with history of alcohol and polysubstance abuse, recurrent small bowel obstructions, chronic hepatitis C (told he was positive about 20 years ago, not treated), unspecified inflammatory myelopathy who presents for follow-up of hospitalization back in June.  We were consulted at that time for hematemesis.  He had several episodes of hematemesis after drinking half a gallon of vodka, cocaine and Percocet use.  Complains of epigastric pain but no melena or rectal bleeding.  Reported odynophagia and dysphagia, occasional heartburn utilizing over-the-counter antacids.  Reported history of cirrhosis however on imaging there is no evidence of overt cirrhosis.  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  Advised to have outpatient follow-up with vascular surgery.  Urine drug screen was positive for cocaine.  View of labs from [facility] from January 2020, hepatitis C, genotype 1a with HCVRNA 2,410,000.  H. pylori stool antigen negative.  Hepatitis B surface antibody nonreactive, HIV nonreactive, hepatitis B core antibody nonreactive, hepatitis B surface antigen negative, hepatitis A antibody negative.      Current Outpatient Medications  Medication Sig Dispense Refill  . feeding supplement, ENSURE ENLIVE, (ENSURE ENLIVE) LIQD Take 237 mLs by mouth 3 (three) times daily between meals. (Patient not taking: Reported on 03/09/2019) 676 mL 12  . folic acid (FOLVITE) 1 MG tablet Take 1 tablet (1 mg total) by mouth daily. (Patient not taking: Reported on  03/09/2019) 30 tablet 0  . lidocaine (XYLOCAINE) 2 % solution Use as directed 10 mLs in the mouth or throat 4 (four) times daily -  with meals and at bedtime. (Patient not taking: Reported on 03/09/2019) 100 mL 0  . nicotine (NICODERM CQ - DOSED IN MG/24 HOURS) 21 mg/24hr patch Place 1 patch (21 mg total) onto the skin daily. (Patient not taking: Reported on 03/09/2019) 28 patch 0  . pantoprazole (PROTONIX) 40 MG tablet Take 1 tablet (40 mg total) by mouth 2 (two) times daily before a meal. (Patient not taking: Reported on 03/09/2019) 60 tablet 0  . sucralfate (CARAFATE) 1 GM/10ML suspension Take 10 mLs (1 g total) by mouth 4 (four) times daily -  with meals and at bedtime. (Patient not taking: Reported on 03/09/2019) 420 mL 0  . thiamine 100 MG tablet Take 1 tablet (100 mg total) by mouth daily. (Patient not taking: Reported on 03/09/2019) 30 tablet 0   No current facility-administered medications for this visit.     Allergies as of 05/07/2019 - Review Complete 03/09/2019  Allergen Reaction Noted  . Acetaminophen Other (See Comments) 12/11/2011    Past Medical History:  Diagnosis Date  . Acute renal failure ([place])    Secondary to rhabdo. Treated with dialysis short-term.  . Alcohol abuse    Psychiatric admissions for alcohol and drug abuse  . Back pain, chronic   . Chronic leg pain   . Chronic pain syndrome   . Cirrhosis ([place])    Patient gives history of cirrhosis but imaging does not clearly details cirrhosis.  . Depression    Multiple psychiatric admissions  . Hepatitis C    HCV AB+2011  . History  of cocaine abuse ([facility])   . Hyponatremia   . Low TSH level January 2013   Normal free T4 of 1.06  . Myositis    [facility].  . 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 Polysubstance abuse ([facility])   . Rhabdomyolysis   . Small bowel obstruction ([facility])   . Tattoos   . Thrombocytopenia ([facility])   . Tobacco abuse     Past Surgical History:  Procedure Laterality Date  .  APPENDECTOMY    . HERNIA REPAIR    . MUSCLE BIOPSY      Family History  Problem Relation Age of Onset  . Hypertension Mother   . Cancer Father        throat cancer   . Diabetes Brother     Social History   Socioeconomic History  . Marital status: Married    Spouse name: Not on file  . Number of children: Not on file  . Years of education: Not on file  . Highest education level: Not on file  Occupational History  . Not on file  Social Needs  . Financial resource strain: Not hard at all  . Food insecurity    Worry: Never true    Inability: Never true  . Transportation needs    Medical: No    Non-medical: No  Tobacco Use  . Smoking status: Current Every Day Smoker    Packs/day: 1.00    Years: 40.00    Pack years: 40.00    Types: Cigarettes  . Smokeless tobacco: Never Used  Substance and Sexual Activity  . Alcohol use: Yes    Alcohol/week: 0.0 standard drinks    Comment: Has not drank in two days per patient   . Drug use: Yes    Types: Cocaine    Comment: last use two days ago  . Sexual activity: Yes  Lifestyle  . Physical activity    Days per week: Not on file    Minutes per session: Not on file  . Stress: Not on file  Relationships  . Social Musicianconnections    Talks on phone: Not on file    Gets together: Not on file    Attends religious service: Not on file    Active member of club or organization: Not on file    Attends meetings of clubs or organizations: Not on file    Relationship status: Not on file  . Intimate partner violence    Fear of current or ex partner: Not on file    Emotionally abused: Not on file    Physically abused: Not on file    Forced sexual activity: Not on file  Other Topics Concern  . Not on file  Social History Narrative  . Not on file      ROS:  General: Negative for anorexia, weight loss, fever, chills, fatigue, weakness. Eyes: Negative for vision changes.  ENT: Negative for hoarseness, difficulty swallowing , nasal  congestion. CV: Negative for chest pain, angina, palpitations, dyspnea on exertion, peripheral edema.  Respiratory: Negative for dyspnea at rest, dyspnea on exertion, cough, sputum, wheezing.  GI: See history of present illness. GU:  Negative for dysuria, hematuria, urinary incontinence, urinary frequency, nocturnal urination.  MS: Negative for joint pain, low back pain.  Derm: Negative for rash or itching.  Neuro: Negative for weakness, abnormal sensation, seizure, frequent headaches, memory loss, confusion.  Psych: Negative for anxiety, depression, suicidal ideation, hallucinations.  Endo: Negative for unusual weight change.  Heme: Negative for bruising or bleeding. Allergy: Negative for rash or hives.    Physical Examination:  There were no vitals taken for this visit.   General: Well-nourished, well-developed in no acute distress.  Head: Normocephalic, atraumatic.   Eyes: Conjunctiva pink, no icterus. Mouth: Oropharyngeal mucosa moist and pink , no lesions erythema or exudate. Neck: Supple without thyromegaly, masses, or lymphadenopathy.  Lungs: Clear to auscultation bilaterally.  Heart: Regular rate and rhythm, no murmurs rubs or gallops.  Abdomen: Bowel sounds are normal, nontender, nondistended, no hepatosplenomegaly or masses, no abdominal bruits or    hernia , no rebound or guarding.   Rectal: *** Extremities: No lower extremity edema. No clubbing or deformities.  Neuro: Alert and oriented x 4 , grossly normal neurologically.  Skin: Warm and dry, no rash or jaundice.   Psych: Alert and cooperative, normal mood and affect.  Labs: Lab Results  Component Value Date   CREATININE 0.75 03/11/2019   BUN 13 03/11/2019   NA 132 (L) 03/11/2019   K 4.9 03/11/2019   CL 100 03/11/2019   CO2 25 03/11/2019   Lab Results  Component Value Date   ALT 14 03/09/2019   AST 16 03/09/2019   ALKPHOS 78 03/09/2019   BILITOT 0.4 03/09/2019   Lab Results  Component Value Date   WBC  7.1 03/11/2019   HGB 10.8 (L) 03/11/2019   HCT 34.9 (L) 03/11/2019   MCV 88.6 03/11/2019   PLT 327 03/11/2019   No results found for: IRON, TIBC, FERRITIN Lab Results  Component Value Date   INR 0.9 03/05/2019   INR 0.8 01/13/2019   INR 1.0 01/09/2019   Lab Results  Component Value Date   LIPASE 21 03/09/2019     Imaging Studies: No results found.

## 2019-05-28 DIAGNOSIS — G9341 Metabolic encephalopathy: Secondary | ICD-10-CM

## 2019-05-28 DIAGNOSIS — K5651 Intestinal adhesions [bands], with partial obstruction: Secondary | ICD-10-CM | POA: Diagnosis not present

## 2019-05-28 DIAGNOSIS — E871 Hypo-osmolality and hyponatremia: Secondary | ICD-10-CM | POA: Diagnosis not present

## 2019-05-28 DIAGNOSIS — F1490 Cocaine use, unspecified, uncomplicated: Secondary | ICD-10-CM

## 2019-05-28 DIAGNOSIS — F1010 Alcohol abuse, uncomplicated: Secondary | ICD-10-CM

## 2019-05-28 DIAGNOSIS — R109 Unspecified abdominal pain: Secondary | ICD-10-CM | POA: Diagnosis not present

## 2019-05-29 DIAGNOSIS — K5651 Intestinal adhesions [bands], with partial obstruction: Secondary | ICD-10-CM | POA: Diagnosis not present

## 2019-05-29 DIAGNOSIS — E871 Hypo-osmolality and hyponatremia: Secondary | ICD-10-CM | POA: Diagnosis not present

## 2019-05-29 DIAGNOSIS — R109 Unspecified abdominal pain: Secondary | ICD-10-CM | POA: Diagnosis not present

## 2019-05-29 DIAGNOSIS — G9341 Metabolic encephalopathy: Secondary | ICD-10-CM | POA: Diagnosis not present

## 2019-05-30 DIAGNOSIS — E871 Hypo-osmolality and hyponatremia: Secondary | ICD-10-CM | POA: Diagnosis not present

## 2019-05-30 DIAGNOSIS — R109 Unspecified abdominal pain: Secondary | ICD-10-CM | POA: Diagnosis not present

## 2019-05-30 DIAGNOSIS — G9341 Metabolic encephalopathy: Secondary | ICD-10-CM | POA: Diagnosis not present

## 2019-05-30 DIAGNOSIS — K5651 Intestinal adhesions [bands], with partial obstruction: Secondary | ICD-10-CM | POA: Diagnosis not present

## 2019-05-31 DIAGNOSIS — G9341 Metabolic encephalopathy: Secondary | ICD-10-CM | POA: Diagnosis not present

## 2019-05-31 DIAGNOSIS — R109 Unspecified abdominal pain: Secondary | ICD-10-CM | POA: Diagnosis not present

## 2019-05-31 DIAGNOSIS — K5651 Intestinal adhesions [bands], with partial obstruction: Secondary | ICD-10-CM | POA: Diagnosis not present

## 2019-05-31 DIAGNOSIS — E871 Hypo-osmolality and hyponatremia: Secondary | ICD-10-CM | POA: Diagnosis not present

## 2019-06-01 DIAGNOSIS — E871 Hypo-osmolality and hyponatremia: Secondary | ICD-10-CM | POA: Diagnosis not present

## 2019-06-01 DIAGNOSIS — R109 Unspecified abdominal pain: Secondary | ICD-10-CM | POA: Diagnosis not present

## 2019-06-01 DIAGNOSIS — K5651 Intestinal adhesions [bands], with partial obstruction: Secondary | ICD-10-CM | POA: Diagnosis not present

## 2019-06-01 DIAGNOSIS — G9341 Metabolic encephalopathy: Secondary | ICD-10-CM | POA: Diagnosis not present

## 2019-06-02 DIAGNOSIS — E871 Hypo-osmolality and hyponatremia: Secondary | ICD-10-CM | POA: Diagnosis not present

## 2019-06-02 DIAGNOSIS — F1010 Alcohol abuse, uncomplicated: Secondary | ICD-10-CM

## 2019-06-02 DIAGNOSIS — G9341 Metabolic encephalopathy: Secondary | ICD-10-CM | POA: Diagnosis not present

## 2019-06-02 DIAGNOSIS — K529 Noninfective gastroenteritis and colitis, unspecified: Secondary | ICD-10-CM

## 2019-06-02 DIAGNOSIS — R4781 Slurred speech: Secondary | ICD-10-CM

## 2019-06-02 DIAGNOSIS — K56609 Unspecified intestinal obstruction, unspecified as to partial versus complete obstruction: Secondary | ICD-10-CM

## 2019-06-02 DIAGNOSIS — J90 Pleural effusion, not elsewhere classified: Secondary | ICD-10-CM

## 2019-06-02 DIAGNOSIS — I340 Nonrheumatic mitral (valve) insufficiency: Secondary | ICD-10-CM

## 2019-06-03 DIAGNOSIS — E871 Hypo-osmolality and hyponatremia: Secondary | ICD-10-CM | POA: Diagnosis not present

## 2019-06-03 DIAGNOSIS — J90 Pleural effusion, not elsewhere classified: Secondary | ICD-10-CM | POA: Diagnosis not present

## 2019-06-03 DIAGNOSIS — K56609 Unspecified intestinal obstruction, unspecified as to partial versus complete obstruction: Secondary | ICD-10-CM | POA: Diagnosis not present

## 2019-06-03 DIAGNOSIS — G9341 Metabolic encephalopathy: Secondary | ICD-10-CM | POA: Diagnosis not present

## 2019-06-06 ENCOUNTER — Other Ambulatory Visit (HOSPITAL_COMMUNITY): Payer: Medicare PPO

## 2019-06-06 DIAGNOSIS — K56609 Unspecified intestinal obstruction, unspecified as to partial versus complete obstruction: Secondary | ICD-10-CM | POA: Diagnosis not present

## 2019-06-07 DIAGNOSIS — K56609 Unspecified intestinal obstruction, unspecified as to partial versus complete obstruction: Secondary | ICD-10-CM | POA: Diagnosis not present

## 2019-06-08 DIAGNOSIS — K56609 Unspecified intestinal obstruction, unspecified as to partial versus complete obstruction: Secondary | ICD-10-CM | POA: Diagnosis not present

## 2019-07-01 NOTE — Patient Instructions (Signed)
Len BlalockJames W Aloisi  07/01/2019     @PREFPERIOPPHARMACY @   Your procedure is scheduled on  07/07/2019.  [facility]  Call this number if you have problems the morning of surgery:  [phone]   Remember:  Follow the diet and prep instructions given to you by Dr Darrick PennaFields office.                      Take these medicines the morning of surgery with A SIP OF WATER None    Do not wear jewelry, make-up or nail polish.  Do not wear lotions, powders, or perfumes. Please wear deodorant and brush your teeth.  Do not shave 48 hours prior to surgery.  Men may shave face and neck.  Do not bring valuables to the hospital.  [facility] any belongings or valuables.  Contacts, dentures or bridgework may not be worn into surgery.  Leave your suitcase in the car.  After surgery it may be brought to your room.  For patients admitted to the hospital, discharge time will be determined by your treatment team.  Patients discharged the day of surgery will not be allowed to drive home.   Name and phone number of your driver:   family Special instructions:  None  Please read over the following fact sheets that you were given. Anesthesia Post-op Instructions and Care and Recovery After Surgery       Upper Endoscopy, Adult, Care After This sheet gives you information about how to care for yourself after your procedure. Your health care provider may also give you more specific instructions. If you have problems or questions, contact your health care provider. What can I expect after the procedure? After the procedure, it is common to have:  A sore throat.  Mild stomach pain or discomfort.  Bloating.  Nausea. Follow these instructions at home:   Follow instructions from your health care provider about what to eat or drink after your procedure.  Return to your normal activities as told by your health care provider. Ask your health care  provider what activities are safe for you.  Take over-the-counter and prescription medicines only as told by your health care provider.  Do not drive for 24 hours if you were given a sedative during your procedure.  Keep all follow-up visits as told by your health care provider. This is important. Contact a health care provider if you have:  A sore throat that lasts longer than one day.  Trouble swallowing. Get help right away if:  You vomit blood or your vomit looks like coffee grounds.  You have: ? A fever. ? Bloody, black, or tarry stools. ? A severe sore throat or you cannot swallow. ? Difficulty breathing. ? Severe pain in your chest or abdomen. Summary  After the procedure, it is common to have a sore throat, mild stomach discomfort, bloating, and nausea.  Do not drive for 24 hours if you were given a sedative during the procedure.  Follow instructions from your health care provider about what to eat or drink after your procedure.  Return to your normal activities as told by your health care provider. This information is not intended to replace advice given to you by your health care provider. Make sure you discuss any questions you have with your health care provider. Document Released: 03/05/2012 Document Revised: 02/26/2018 Document Reviewed: 02/04/2018 Elsevier  Patient Education  [employer].  Esophageal Dilatation Esophageal dilatation, also called esophageal dilation, is a procedure to widen or open (dilate) a blocked or narrowed part of the esophagus. The esophagus is the part of the body that moves food and liquid from the mouth to the stomach. You may need this procedure if:  You have a buildup of scar tissue in your esophagus that makes it difficult, painful, or impossible to swallow. This can be caused by gastroesophageal reflux disease (GERD).  You have cancer of the esophagus.  There is a problem with how food moves through your esophagus. In some  cases, you may need this procedure repeated at a later time to dilate the esophagus gradually. Tell a health care provider about:  Any allergies you have.  All medicines you are taking, including vitamins, herbs, eye drops, creams, and over-the-counter medicines.  Any problems you or family members have had with anesthetic medicines.  Any blood disorders you have.  Any surgeries you have had.  Any medical conditions you have.  Any antibiotic medicines you are required to take before dental procedures.  Whether you are pregnant or may be pregnant. What are the risks? Generally, this is a safe procedure. However, problems may occur, including:  Bleeding due to a tear in the lining of the esophagus.  A hole (perforation) in the esophagus. What happens before the procedure?  Follow instructions from your health care provider about eating or drinking restrictions.  Ask your health care provider about changing or stopping your regular medicines. This is especially important if you are taking diabetes medicines or blood thinners.  Plan to have someone take you home from the hospital or clinic.  Plan to have a responsible adult care for you for at least 24 hours after you leave the hospital or clinic. This is important. What happens during the procedure?  You may be given a medicine to help you relax (sedative).  A numbing medicine may be sprayed into the back of your throat, or you may gargle the medicine.  Your health care provider may perform the dilatation using various surgical instruments, such as: ? Simple dilators. This instrument is carefully placed in the esophagus to stretch it. ? Guided wire bougies. This involves using an endoscope to insert a wire into the esophagus. A dilator is passed over this wire to enlarge the esophagus. Then the wire is removed. ? Balloon dilators. An endoscope with a small balloon at the end is inserted into the esophagus. The balloon is  inflated to stretch the esophagus and open it up. The procedure may vary among health care providers and hospitals. What happens after the procedure?  Your blood pressure, heart rate, breathing rate, and blood oxygen level will be monitored until the medicines you were given have worn off.  Your throat may feel slightly sore and numb. This will improve slowly over time.  You will not be allowed to eat or drink until your throat is no longer numb.  When you are able to drink, urinate, and sit on the edge of the bed without nausea or dizziness, you may be able to return home. Follow these instructions at home:  Take over-the-counter and prescription medicines only as told by your health care provider.  Do not drive for 24 hours if you were given a sedative during your procedure.  You should have a responsible adult with you for 24 hours after the procedure.  Follow instructions from your health care provider about  any eating or drinking restrictions.  Do not use any products that contain nicotine or tobacco, such as cigarettes and e-cigarettes. If you need help quitting, ask your health care provider.  Keep all follow-up visits as told by your health care provider. This is important. Get help right away if you:  Have a fever.  Have chest pain.  Have pain that is not relieved by medication.  Have trouble breathing.  Have trouble swallowing.  Vomit blood. Summary  Esophageal dilatation, also called esophageal dilation, is a procedure to widen or open (dilate) a blocked or narrowed part of the esophagus.  Plan to have someone take you home from the hospital or clinic.  For this procedure, a numbing medicine may be sprayed into the back of your throat, or you may gargle the medicine.  Do not drive for 24 hours if you were given a sedative during your procedure. This information is not intended to replace advice given to you by your health care provider. Make sure you discuss  any questions you have with your health care provider. Document Released: 10/26/2005 Document Revised: 08/17/2017 Document Reviewed: 07/10/2017 Elsevier Patient Education  2020 [employer].  Colonoscopy, Adult, Care After This sheet gives you information about how to care for yourself after your procedure. Your health care provider may also give you more specific instructions. If you have problems or questions, contact your health care provider. What can I expect after the procedure? After the procedure, it is common to have:  A small amount of blood in your stool for 24 hours after the procedure.  Some gas.  Mild abdominal cramping or bloating. Follow these instructions at home: General instructions  For the first 24 hours after the procedure: ? Do not drive or use machinery. ? Do not sign important documents. ? Do not drink alcohol. ? Do your regular daily activities at a slower pace than normal. ? Eat soft, easy-to-digest foods.  Take over-the-counter or prescription medicines only as told by your health care provider. Relieving cramping and bloating   Try walking around when you have cramps or feel bloated.  Apply heat to your abdomen as told by your health care provider. Use a heat source that your health care provider recommends, such as a moist heat pack or a heating pad. ? Place a towel between your skin and the heat source. ? Leave the heat on for 20-30 minutes. ? Remove the heat if your skin turns bright red. This is especially important if you are unable to feel pain, heat, or cold. You may have a greater risk of getting burned. Eating and drinking   Drink enough fluid to keep your urine pale yellow.  Resume your normal diet as instructed by your health care provider. Avoid heavy or fried foods that are hard to digest.  Avoid drinking alcohol for as long as instructed by your health care provider. Contact a health care provider if:  You have blood in your  stool 2-3 days after the procedure. Get help right away if:  You have more than a small spotting of blood in your stool.  You pass large blood clots in your stool.  Your abdomen is swollen.  You have nausea or vomiting.  You have a fever.  You have increasing abdominal pain that is not relieved with medicine. Summary  After the procedure, it is common to have a small amount of blood in your stool. You may also have mild abdominal cramping and bloating.  For  the first 24 hours after the procedure, do not drive or use machinery, sign important documents, or drink alcohol.  Contact your health care provider if you have a lot of blood in your stool, nausea or vomiting, a fever, or increased abdominal pain. This information is not intended to replace advice given to you by your health care provider. Make sure you discuss any questions you have with your health care provider. Document Released: 04/18/2004 Document Revised: 06/27/2017 Document Reviewed: 11/16/2015 Elsevier Patient Education  2020 [place] After These instructions provide you with information about caring for yourself after your procedure. Your health care provider may also give you more specific instructions. Your treatment has been planned according to current medical practices, but problems sometimes occur. Call your health care provider if you have any problems or questions after your procedure. What can I expect after the procedure? After your procedure, you may:  Feel sleepy for several hours.  Feel clumsy and have poor balance for several hours.  Feel forgetful about what happened after the procedure.  Have poor judgment for several hours.  Feel nauseous or vomit.  Have a sore throat if you had a breathing tube during the procedure. Follow these instructions at home: For at least 24 hours after the procedure:      Have a responsible adult stay with you. It is  important to have someone help care for you until you are awake and alert.  Rest as needed.  Do not: ? Participate in activities in which you could fall or become injured. ? Drive. ? Use heavy machinery. ? Drink alcohol. ? Take sleeping pills or medicines that cause drowsiness. ? Make important decisions or sign legal documents. ? Take care of children on your own. Eating and drinking  Follow the diet that is recommended by your health care provider.  If you vomit, drink water, juice, or soup when you can drink without vomiting.  Make sure you have little or no nausea before eating solid foods. General instructions  Take over-the-counter and prescription medicines only as told by your health care provider.  If you have sleep apnea, surgery and certain medicines can increase your risk for breathing problems. Follow instructions from your health care provider about wearing your sleep device: ? Anytime you are sleeping, including during daytime naps. ? While taking prescription pain medicines, sleeping medicines, or medicines that make you drowsy.  If you smoke, do not smoke without supervision.  Keep all follow-up visits as told by your health care provider. This is important. Contact a health care provider if:  You keep feeling nauseous or you keep vomiting.  You feel light-headed.  You develop a rash.  You have a fever. Get help right away if:  You have trouble breathing. Summary  For several hours after your procedure, you may feel sleepy and have poor judgment.  Have a responsible adult stay with you for at least 24 hours or until you are awake and alert. This information is not intended to replace advice given to you by your health care provider. Make sure you discuss any questions you have with your health care provider. Document Released: 12/26/2015 Document Revised: 12/03/2017 Document Reviewed: 12/26/2015 Elsevier Patient Education  2020 [employer].

## 2019-07-02 ENCOUNTER — Telehealth: Payer: Self-pay | Admitting: *Deleted

## 2019-07-02 NOTE — Telephone Encounter (Signed)
Spoke with spouse and she states patient has moved to [place] and had colon surgery 2 weeks ago. She states to cancel procedure he is scheduled for her on Monday with [facility]. I advised her to have patient call us as well if he is transferring his care so we can make note of this. Called endo and made aware. FYI to [facility].

## 2019-07-03 ENCOUNTER — Other Ambulatory Visit (HOSPITAL_COMMUNITY)
Admission: RE | Admit: 2019-07-03 | Discharge: 2019-07-03 | Disposition: A | Discharge status: Home / Self Care | Payer: Medicare PPO | Source: Ambulatory Visit | Attending: Gastroenterology | Admitting: Gastroenterology

## 2019-07-03 ENCOUNTER — Encounter (HOSPITAL_COMMUNITY)
Admission: RE | Admit: 2019-07-03 | Discharge: 2019-07-03 | Disposition: A | Discharge status: Home / Self Care | Payer: Medicare PPO | Source: Ambulatory Visit | Attending: Gastroenterology | Admitting: Gastroenterology

## 2019-07-03 ENCOUNTER — Encounter (HOSPITAL_COMMUNITY): Payer: Self-pay

## 2019-07-03 NOTE — Telephone Encounter (Signed)
REVIEWED-NO ADDITIONAL RECOMMENDATIONS. 

## 2019-07-07 ENCOUNTER — Encounter (HOSPITAL_COMMUNITY): Admission: RE | Payer: Self-pay | Source: Home / Self Care

## 2019-07-07 ENCOUNTER — Ambulatory Visit (HOSPITAL_COMMUNITY): Admission: RE | Admit: 2019-07-07 | Payer: Medicare PPO | Source: Home / Self Care | Admitting: Gastroenterology

## 2019-07-07 SURGERY — COLONOSCOPY WITH PROPOFOL
Anesthesia: Monitor Anesthesia Care

## 2019-08-09 DIAGNOSIS — N179 Acute kidney failure, unspecified: Secondary | ICD-10-CM

## 2019-08-09 DIAGNOSIS — F1010 Alcohol abuse, uncomplicated: Secondary | ICD-10-CM

## 2019-08-09 DIAGNOSIS — R748 Abnormal levels of other serum enzymes: Secondary | ICD-10-CM

## 2019-08-09 DIAGNOSIS — E876 Hypokalemia: Secondary | ICD-10-CM

## 2019-08-09 DIAGNOSIS — F10239 Alcohol dependence with withdrawal, unspecified: Secondary | ICD-10-CM

## 2019-08-09 DIAGNOSIS — E871 Hypo-osmolality and hyponatremia: Secondary | ICD-10-CM

## 2019-08-10 DIAGNOSIS — F10239 Alcohol dependence with withdrawal, unspecified: Secondary | ICD-10-CM | POA: Diagnosis not present

## 2019-08-10 DIAGNOSIS — N179 Acute kidney failure, unspecified: Secondary | ICD-10-CM | POA: Diagnosis not present

## 2019-08-10 DIAGNOSIS — F1010 Alcohol abuse, uncomplicated: Secondary | ICD-10-CM | POA: Diagnosis not present

## 2019-08-10 DIAGNOSIS — E876 Hypokalemia: Secondary | ICD-10-CM | POA: Diagnosis not present

## 2019-08-11 DIAGNOSIS — E876 Hypokalemia: Secondary | ICD-10-CM | POA: Diagnosis not present

## 2019-08-11 DIAGNOSIS — N179 Acute kidney failure, unspecified: Secondary | ICD-10-CM | POA: Diagnosis not present

## 2019-08-11 DIAGNOSIS — F10239 Alcohol dependence with withdrawal, unspecified: Secondary | ICD-10-CM | POA: Diagnosis not present

## 2019-08-11 DIAGNOSIS — F1010 Alcohol abuse, uncomplicated: Secondary | ICD-10-CM | POA: Diagnosis not present

## 2019-08-12 DIAGNOSIS — F1010 Alcohol abuse, uncomplicated: Secondary | ICD-10-CM | POA: Diagnosis not present

## 2019-08-12 DIAGNOSIS — N179 Acute kidney failure, unspecified: Secondary | ICD-10-CM | POA: Diagnosis not present

## 2019-08-12 DIAGNOSIS — E876 Hypokalemia: Secondary | ICD-10-CM | POA: Diagnosis not present

## 2019-08-12 DIAGNOSIS — F10239 Alcohol dependence with withdrawal, unspecified: Secondary | ICD-10-CM | POA: Diagnosis not present

## 2019-12-13 DIAGNOSIS — R748 Abnormal levels of other serum enzymes: Secondary | ICD-10-CM

## 2019-12-13 DIAGNOSIS — R262 Difficulty in walking, not elsewhere classified: Secondary | ICD-10-CM

## 2019-12-13 DIAGNOSIS — F1010 Alcohol abuse, uncomplicated: Secondary | ICD-10-CM | POA: Diagnosis not present

## 2019-12-13 DIAGNOSIS — M87059 Idiopathic aseptic necrosis of unspecified femur: Secondary | ICD-10-CM

## 2019-12-14 DIAGNOSIS — F1010 Alcohol abuse, uncomplicated: Secondary | ICD-10-CM | POA: Diagnosis not present

## 2019-12-14 DIAGNOSIS — R748 Abnormal levels of other serum enzymes: Secondary | ICD-10-CM | POA: Diagnosis not present

## 2019-12-14 DIAGNOSIS — M87059 Idiopathic aseptic necrosis of unspecified femur: Secondary | ICD-10-CM | POA: Diagnosis not present

## 2019-12-14 DIAGNOSIS — R262 Difficulty in walking, not elsewhere classified: Secondary | ICD-10-CM | POA: Diagnosis not present

## 2019-12-15 DIAGNOSIS — R748 Abnormal levels of other serum enzymes: Secondary | ICD-10-CM | POA: Diagnosis not present

## 2019-12-15 DIAGNOSIS — M87059 Idiopathic aseptic necrosis of unspecified femur: Secondary | ICD-10-CM | POA: Diagnosis not present

## 2019-12-15 DIAGNOSIS — R262 Difficulty in walking, not elsewhere classified: Secondary | ICD-10-CM | POA: Diagnosis not present

## 2019-12-15 DIAGNOSIS — F1010 Alcohol abuse, uncomplicated: Secondary | ICD-10-CM | POA: Diagnosis not present

## 2020-03-03 DIAGNOSIS — R7401 Elevation of levels of liver transaminase levels: Secondary | ICD-10-CM | POA: Diagnosis not present

## 2020-03-03 DIAGNOSIS — R262 Difficulty in walking, not elsewhere classified: Secondary | ICD-10-CM | POA: Diagnosis not present

## 2020-03-03 DIAGNOSIS — M87052 Idiopathic aseptic necrosis of left femur: Secondary | ICD-10-CM | POA: Diagnosis not present

## 2020-03-03 DIAGNOSIS — M25452 Effusion, left hip: Secondary | ICD-10-CM | POA: Diagnosis not present

## 2020-03-04 DIAGNOSIS — M25452 Effusion, left hip: Secondary | ICD-10-CM | POA: Diagnosis not present

## 2020-03-04 DIAGNOSIS — M87052 Idiopathic aseptic necrosis of left femur: Secondary | ICD-10-CM | POA: Diagnosis not present

## 2020-03-04 DIAGNOSIS — R7401 Elevation of levels of liver transaminase levels: Secondary | ICD-10-CM | POA: Diagnosis not present

## 2020-03-04 DIAGNOSIS — R262 Difficulty in walking, not elsewhere classified: Secondary | ICD-10-CM | POA: Diagnosis not present

## 2020-03-05 DIAGNOSIS — R7401 Elevation of levels of liver transaminase levels: Secondary | ICD-10-CM | POA: Diagnosis not present

## 2020-03-05 DIAGNOSIS — M87052 Idiopathic aseptic necrosis of left femur: Secondary | ICD-10-CM | POA: Diagnosis not present

## 2020-03-05 DIAGNOSIS — R262 Difficulty in walking, not elsewhere classified: Secondary | ICD-10-CM | POA: Diagnosis not present

## 2020-03-05 DIAGNOSIS — M25452 Effusion, left hip: Secondary | ICD-10-CM | POA: Diagnosis not present

## 2020-03-06 DIAGNOSIS — M25452 Effusion, left hip: Secondary | ICD-10-CM | POA: Diagnosis not present

## 2020-03-06 DIAGNOSIS — R7401 Elevation of levels of liver transaminase levels: Secondary | ICD-10-CM | POA: Diagnosis not present

## 2020-03-06 DIAGNOSIS — R262 Difficulty in walking, not elsewhere classified: Secondary | ICD-10-CM | POA: Diagnosis not present

## 2020-03-06 DIAGNOSIS — M87052 Idiopathic aseptic necrosis of left femur: Secondary | ICD-10-CM | POA: Diagnosis not present

## 2020-03-07 DIAGNOSIS — M25452 Effusion, left hip: Secondary | ICD-10-CM | POA: Diagnosis not present

## 2020-03-07 DIAGNOSIS — R7401 Elevation of levels of liver transaminase levels: Secondary | ICD-10-CM | POA: Diagnosis not present

## 2020-03-07 DIAGNOSIS — R262 Difficulty in walking, not elsewhere classified: Secondary | ICD-10-CM | POA: Diagnosis not present

## 2020-03-07 DIAGNOSIS — M87052 Idiopathic aseptic necrosis of left femur: Secondary | ICD-10-CM | POA: Diagnosis not present

## 2020-03-08 DIAGNOSIS — R262 Difficulty in walking, not elsewhere classified: Secondary | ICD-10-CM | POA: Diagnosis not present

## 2020-03-08 DIAGNOSIS — M87052 Idiopathic aseptic necrosis of left femur: Secondary | ICD-10-CM | POA: Diagnosis not present

## 2020-03-08 DIAGNOSIS — R7401 Elevation of levels of liver transaminase levels: Secondary | ICD-10-CM | POA: Diagnosis not present

## 2020-03-08 DIAGNOSIS — M25452 Effusion, left hip: Secondary | ICD-10-CM | POA: Diagnosis not present

## 2020-03-09 DIAGNOSIS — R7401 Elevation of levels of liver transaminase levels: Secondary | ICD-10-CM | POA: Diagnosis not present

## 2020-03-09 DIAGNOSIS — M25452 Effusion, left hip: Secondary | ICD-10-CM | POA: Diagnosis not present

## 2020-03-09 DIAGNOSIS — M87052 Idiopathic aseptic necrosis of left femur: Secondary | ICD-10-CM | POA: Diagnosis not present

## 2020-03-09 DIAGNOSIS — R262 Difficulty in walking, not elsewhere classified: Secondary | ICD-10-CM | POA: Diagnosis not present

## 2020-04-12 DIAGNOSIS — Z01818 Encounter for other preprocedural examination: Secondary | ICD-10-CM

## 2020-08-04 IMAGING — DX CHEST - 2 VIEW
2 series · 2 of 2 positions shown · non-contrast
Comparison: 01/13/2019

CLINICAL DATA: Chest pain

EXAM:
CHEST - 2 VIEW

[chest lat]
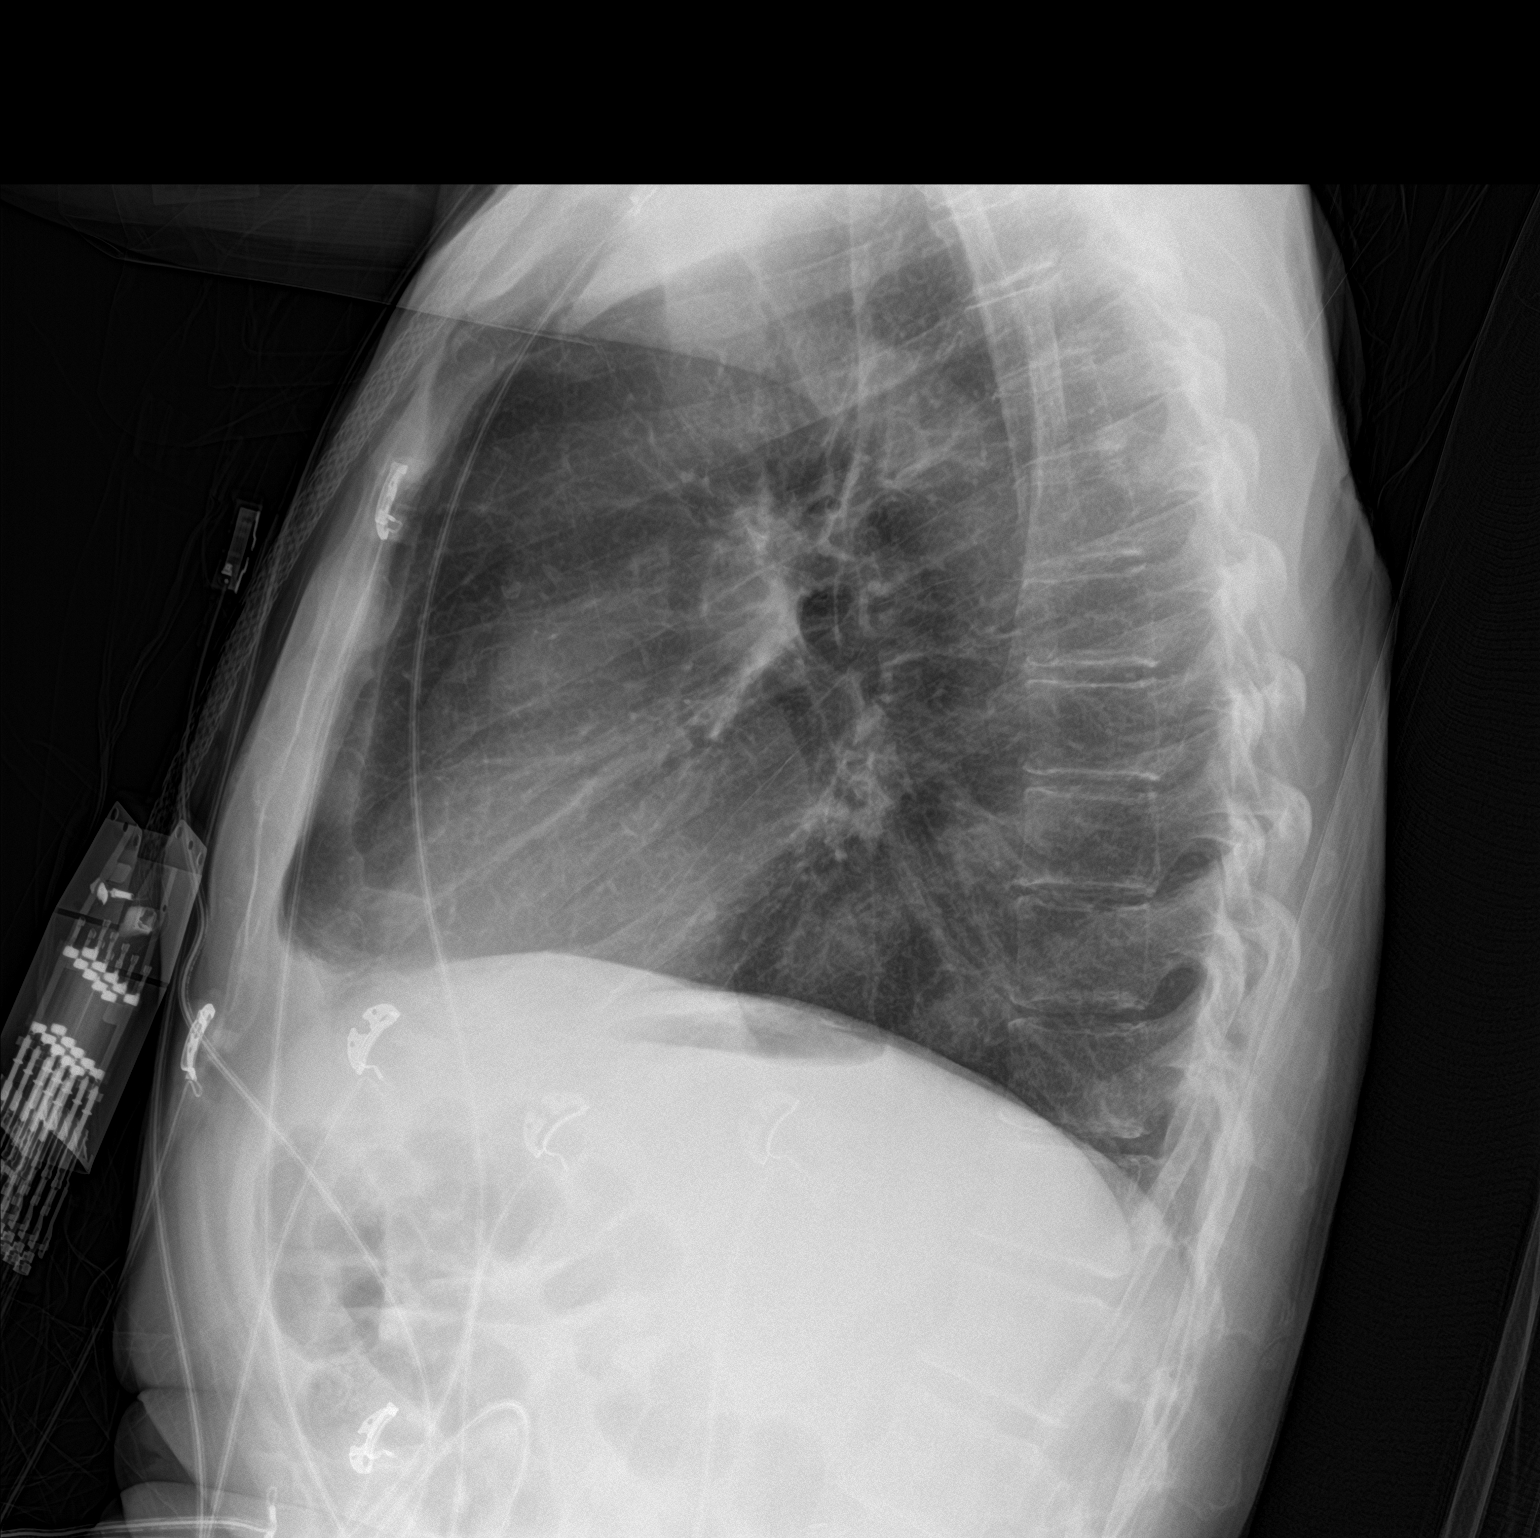

[chest ap]
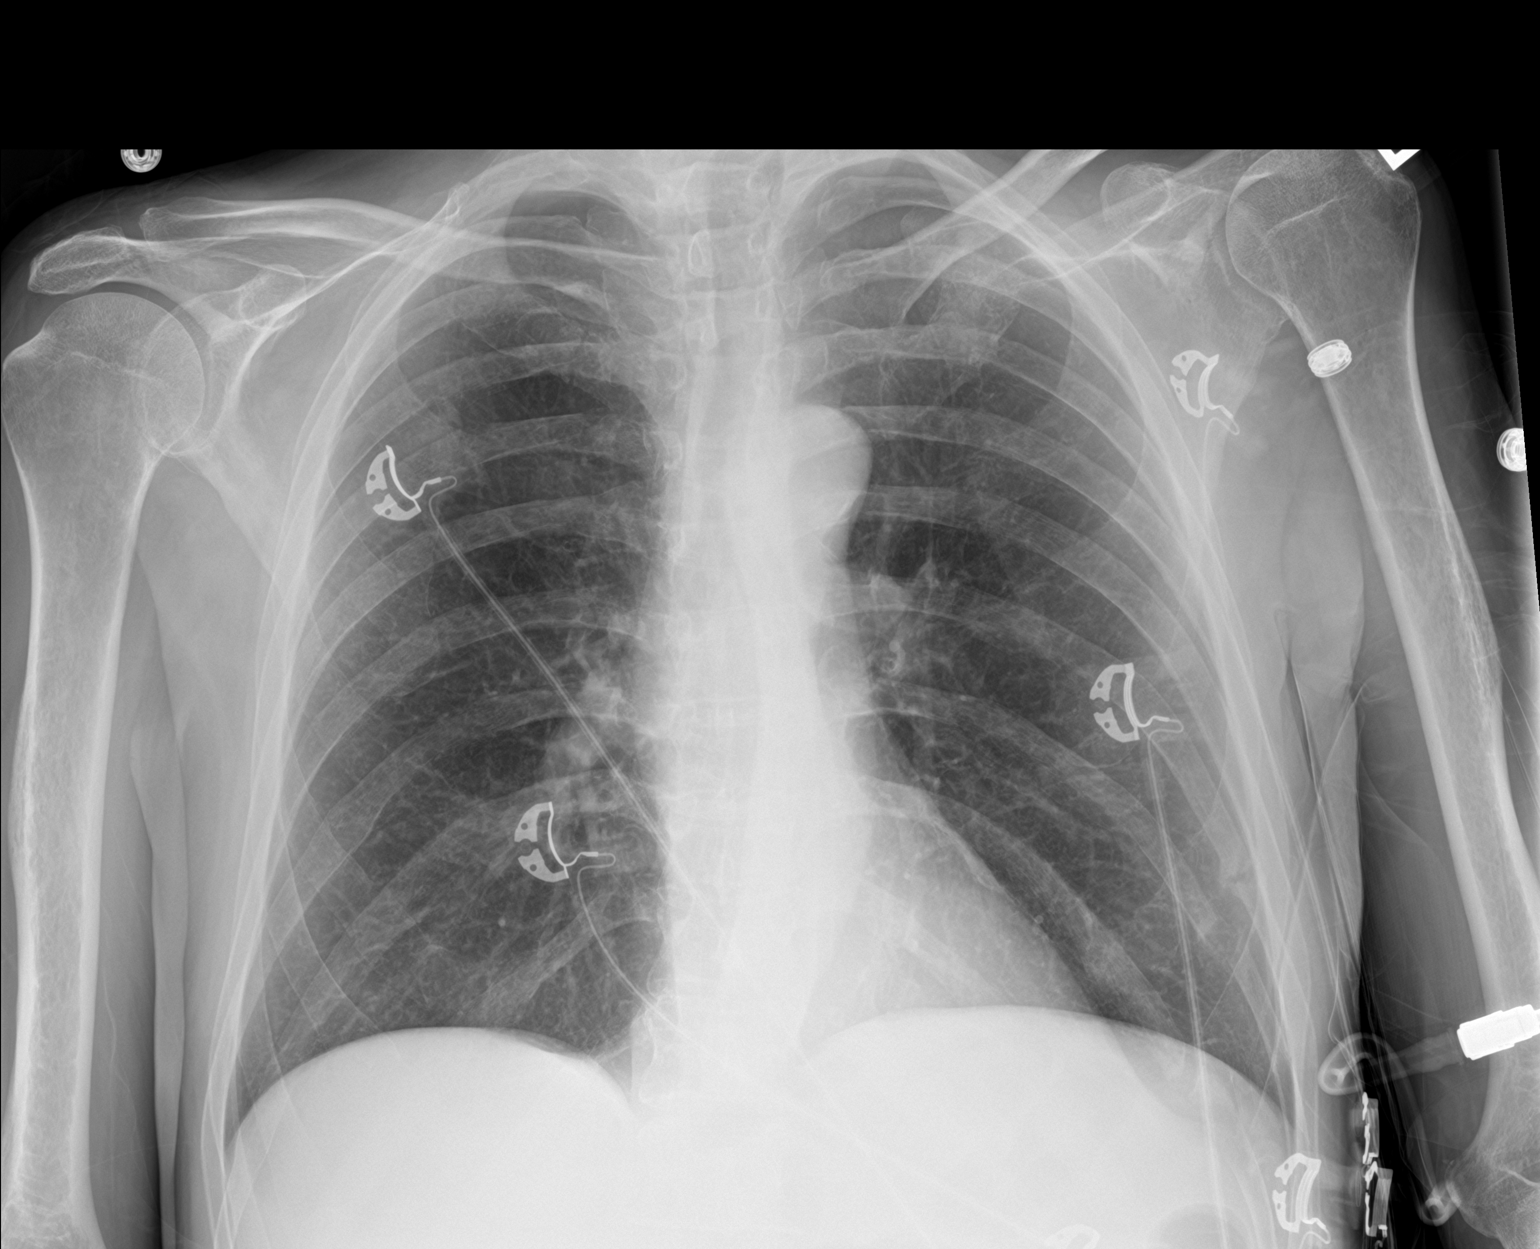

[2 of 2 positions shown; findings below may reference images not displayed]

FINDINGS: No acute opacity or pleural effusion. Stable cardiomediastinal
silhouette with aortic atherosclerosis. No pneumothorax. Ununited
left eighth and ninth rib fractures.
IMPRESSION: No active cardiopulmonary disease.

## 2020-08-04 IMAGING — CT CT ANGIO CHEST-ABD-PELV FOR DISSECTION W/ AND WO/W CM
2 of 7 series · 13 of 46 positions shown, 15 images · IV contrast (Isovue)
Comparison: CT abdomen and pelvis 01/06/2019

CLINICAL DATA: Chest pain

EXAM:
CT ANGIOGRAPHY CHEST, ABDOMEN AND PELVIS
TECHNIQUE: Multidetector CT imaging through the chest, abdomen and pelvis was
performed using the standard protocol during bolus administration of
intravenous contrast. Multiplanar reconstructed images and MIPs were
obtained and reviewed to evaluate the vascular anatomy.
CONTRAST:  100mL OMNIPAQUE IOHEXOL 350 MG/ML SOLN

[Series 5: dissection axial arterial · axial · arterial · 0.65mm/px · z∈[-586,-10]mm · 10 of 224 slices shown, 12 images]
[im 16/224  soft-tissue]
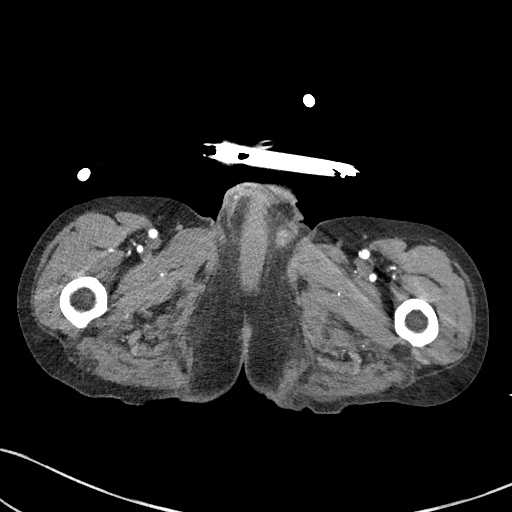
[im 16/224  bone]
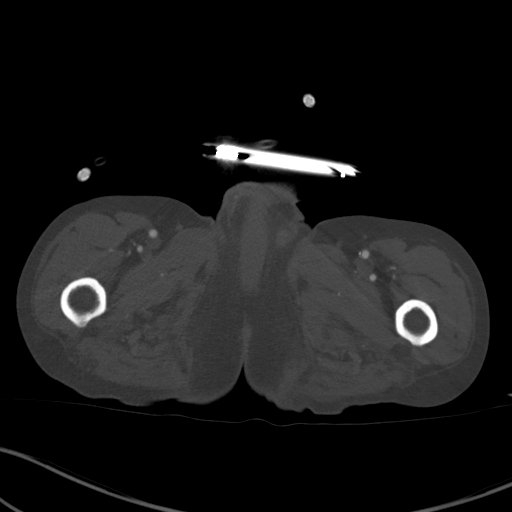
[im 32/224  soft-tissue]
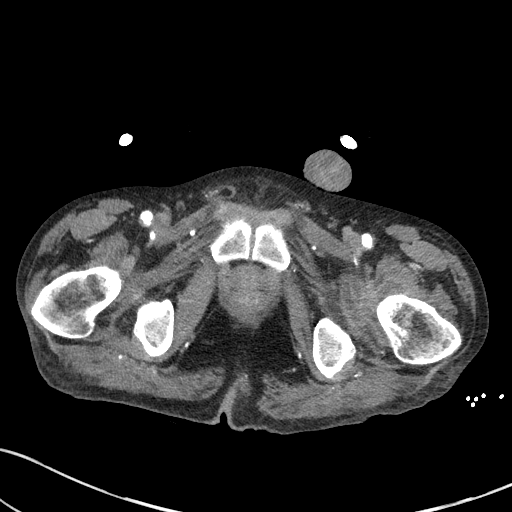
[im 64/224  soft-tissue]
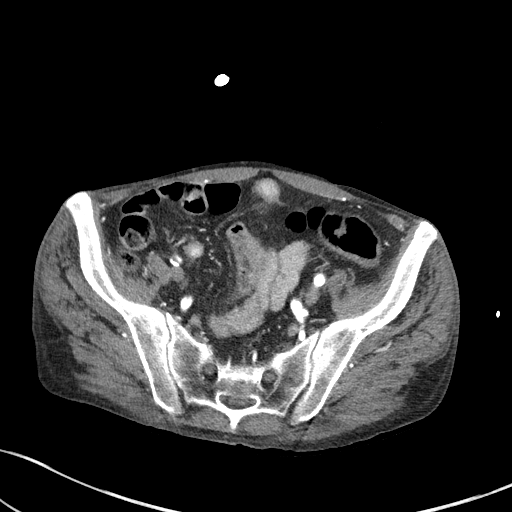
[im 80/224  soft-tissue]
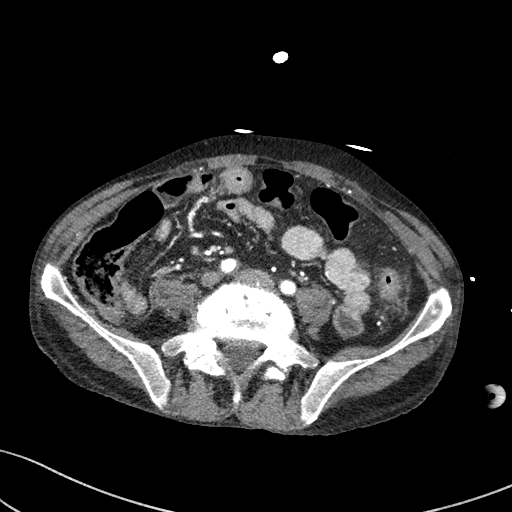
[im 96/224  soft-tissue]
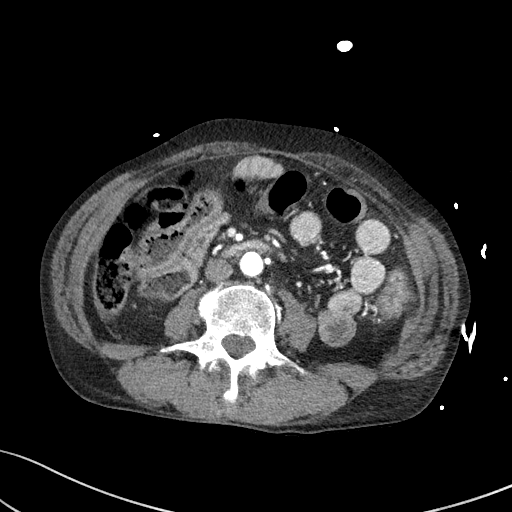
[im 128/224  soft-tissue]
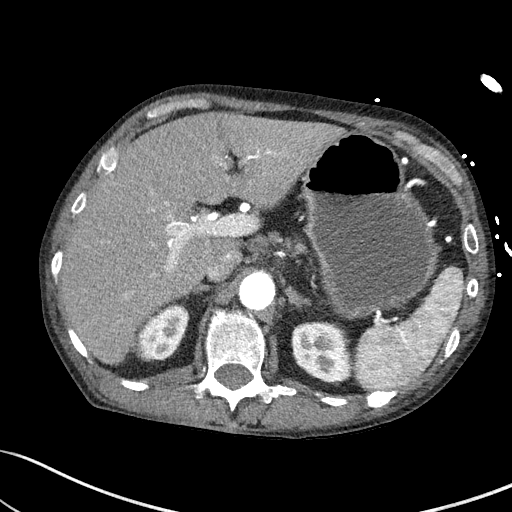
[im 144/224  soft-tissue]
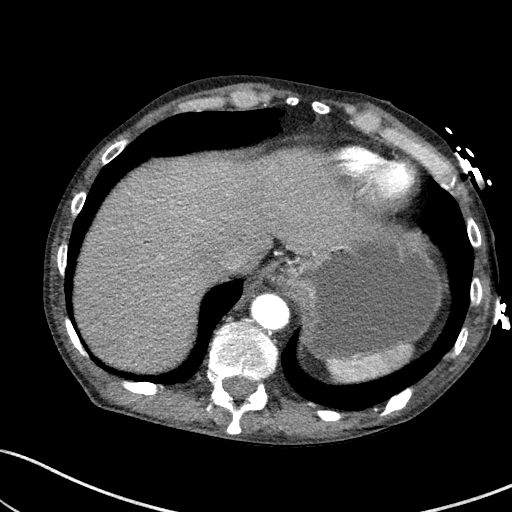
[im 160/224  soft-tissue]
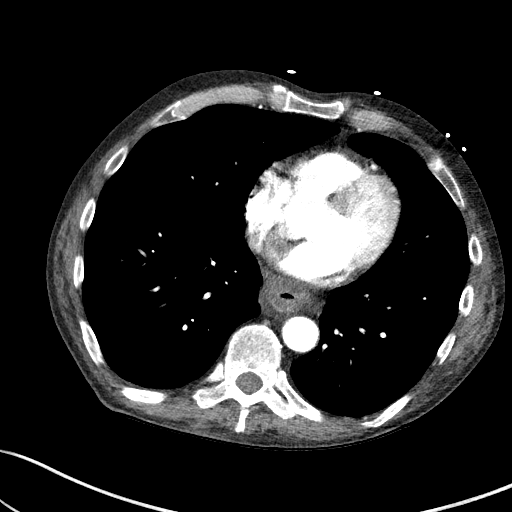
[im 192/224  soft-tissue]
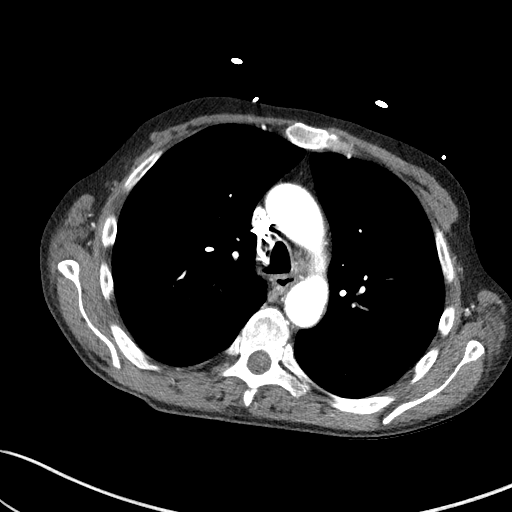
[im 192/224  bone]
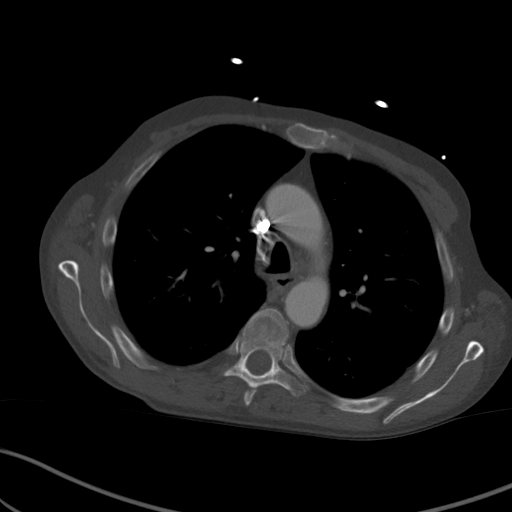
[im 208/224  soft-tissue]
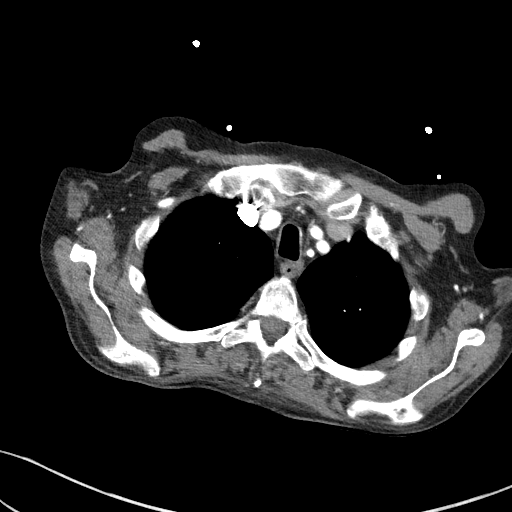

[Series 9: coronals · coronal · 0.75mm/px · 3 of 109 slices shown]
[im 28/109  soft-tissue]
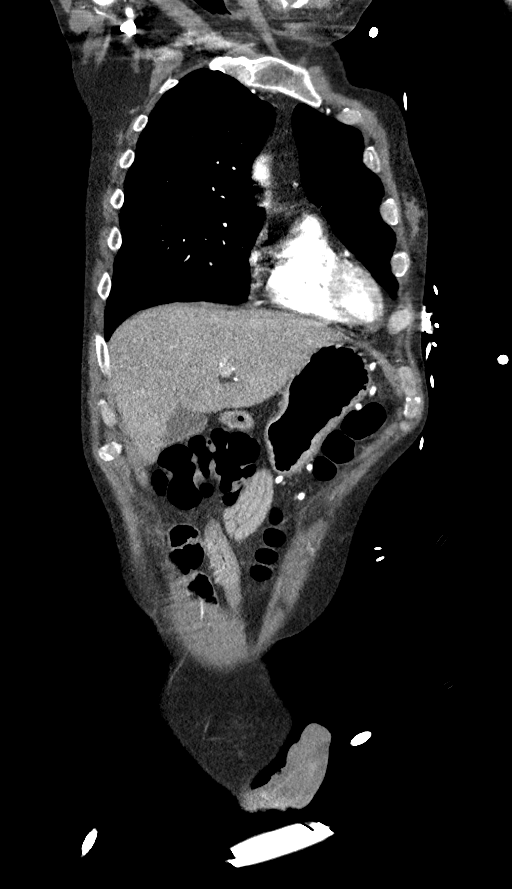
[im 55/109  soft-tissue]
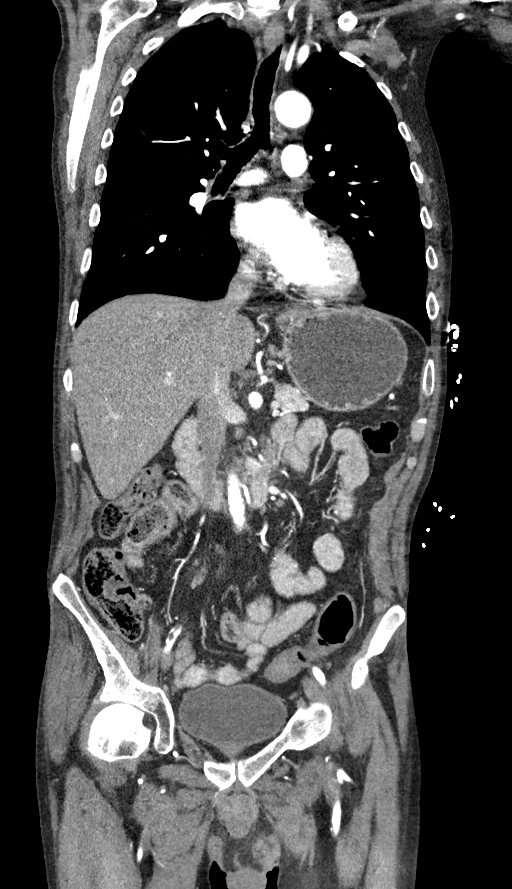
[im 82/109  soft-tissue]
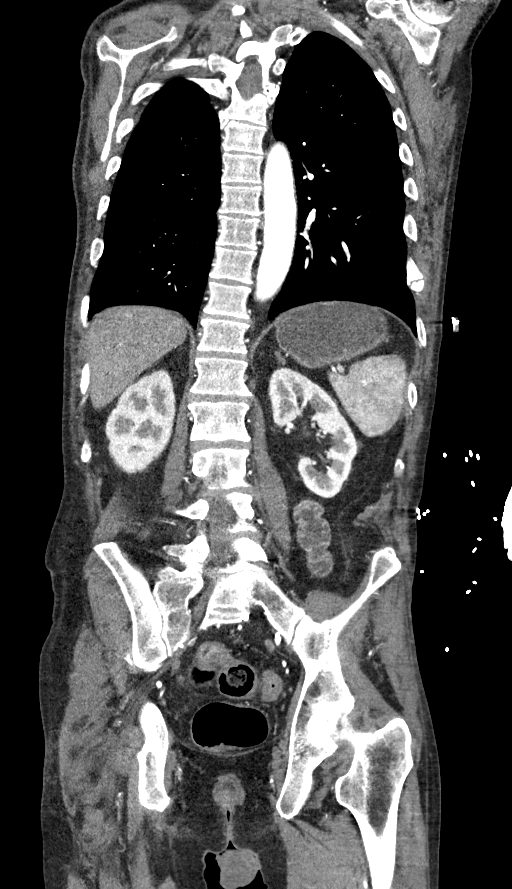

[13 of 46 positions shown; findings below may reference images not displayed]

FINDINGS: CTA CHEST FINDINGS

Cardiovascular: Heart is normal size. Aorta is normal caliber. No
evidence of aortic dissection. No filling defects in the pulmonary
arteries to suggest pulmonary emboli.

Mediastinum/Nodes: No mediastinal, hilar, or axillary adenopathy.
Markedly thickened mid to distal esophagus compatible with
esophagitis.

Lungs/Pleura: Lungs are clear. No focal airspace opacities or
suspicious nodules. No effusions.

Musculoskeletal: Multiple old left rib fractures involving the 8th
through 12th ribs. There is a 2nd fracture through the lateral left
10th rib which could be acute.

Review of the MIP images confirms the above findings.

CTA ABDOMEN AND PELVIS FINDINGS

VASCULAR

Aorta: Normal caliber aorta without aneurysm, dissection, vasculitis
or significant stenosis. Mild distal aortic atherosclerosis.

Celiac: Patent without evidence of aneurysm, dissection, vasculitis
or significant stenosis.

SMA: Patent without evidence of aneurysm, dissection, vasculitis or
significant stenosis.

Renals: Both renal arteries are patent without evidence of aneurysm,
dissection, vasculitis, fibromuscular dysplasia or significant
stenosis.

IMA: Patent without evidence of aneurysm, dissection, vasculitis or
significant stenosis.

Inflow: Atherosclerosis in the iliofemoral vessels. There is
moderate to tight stenosis within the right external iliac artery
with greater than 70% narrowing focal dissection noted within the
right internal iliac artery.

Veins: No obvious venous abnormality within the limitations of this
arterial phase study.

Review of the MIP images confirms the above findings.

NON-VASCULAR

Hepatobiliary: No focal hepatic abnormality. Gallbladder
unremarkable.

Pancreas: No focal abnormality or ductal dilatation.

Spleen: No focal abnormality.  Normal size.

Adrenals/Urinary Tract: No adrenal abnormality. No focal renal
abnormality. No stones or hydronephrosis. Urinary bladder is
unremarkable.

Stomach/Bowel: There is wall thickening within the descending colon
and proximal sigmoid colon compatible with colitis. Mild
circumferential wall thickening within the mid to distal ileum
suggesting enteritis. Stomach grossly unremarkable.

Lymphatic: No adenopathy

Reproductive: No visible focal abnormality.

Other: No free fluid or free air.

Musculoskeletal: No acute bony abnormality.

Review of the MIP images confirms the above findings.
IMPRESSION: No evidence of aortic aneurysm or dissection.

Moderately tight focal stenosis within the right external iliac
artery, greater than 70%. Focal dissection within the right internal
iliac artery, likely not clinically significant.

Distal aortic atherosclerosis.

Marked circumferential wall thickening within the mid to distal
esophagus compatible with esophagitis.

Mild wall thickening within the descending colon and mid to distal
ileum compatible with colitis/enteritis.

## 2021-12-17 DEATH — deceased
